# Patient Record
Sex: Male | Born: 1995 | Race: Black or African American | Hispanic: No | Smoking: Never smoker
Health system: Southern US, Community
[De-identification: ages and names within clinical notes are randomized; demographics above are authoritative.]

## PROBLEM LIST (undated history)

## (undated) DIAGNOSIS — I10 Essential (primary) hypertension: Secondary | ICD-10-CM

## (undated) DIAGNOSIS — B20 Human immunodeficiency virus [HIV] disease: Secondary | ICD-10-CM

## (undated) DIAGNOSIS — Z8774 Personal history of (corrected) congenital malformations of heart and circulatory system: Secondary | ICD-10-CM

## (undated) DIAGNOSIS — Q224 Congenital tricuspid stenosis: Secondary | ICD-10-CM

## (undated) DIAGNOSIS — I2699 Other pulmonary embolism without acute cor pulmonale: Secondary | ICD-10-CM

## (undated) DIAGNOSIS — R042 Hemoptysis: Secondary | ICD-10-CM

## (undated) DIAGNOSIS — Q249 Congenital malformation of heart, unspecified: Secondary | ICD-10-CM

## (undated) DIAGNOSIS — J029 Acute pharyngitis, unspecified: Secondary | ICD-10-CM

## (undated) DIAGNOSIS — Z21 Asymptomatic human immunodeficiency virus [HIV] infection status: Secondary | ICD-10-CM

## (undated) DIAGNOSIS — G43909 Migraine, unspecified, not intractable, without status migrainosus: Secondary | ICD-10-CM

## (undated) DIAGNOSIS — R197 Diarrhea, unspecified: Secondary | ICD-10-CM

## (undated) DIAGNOSIS — B2790 Infectious mononucleosis, unspecified without complication: Secondary | ICD-10-CM

## (undated) DIAGNOSIS — Z9889 Other specified postprocedural states: Secondary | ICD-10-CM

## (undated) HISTORY — DX: Diarrhea, unspecified: R19.7

## (undated) HISTORY — PX: CARDIAC SURGERY: SHX584

---

## 2016-01-13 HISTORY — PX: CARDIAC CATHETERIZATION: SHX172

## 2016-01-28 ENCOUNTER — Emergency Department (HOSPITAL_COMMUNITY): Payer: Commercial Managed Care - HMO

## 2016-01-28 ENCOUNTER — Encounter (HOSPITAL_COMMUNITY): Payer: Self-pay

## 2016-01-28 ENCOUNTER — Inpatient Hospital Stay (HOSPITAL_COMMUNITY)
Admission: EM | Admit: 2016-01-28 | Discharge: 2016-01-30 | DRG: 175 | Disposition: A | Discharge status: Other Acute Inpatient Hospital | Payer: Commercial Managed Care - HMO | Attending: Internal Medicine | Admitting: Internal Medicine

## 2016-01-28 DIAGNOSIS — R519 Headache, unspecified: Secondary | ICD-10-CM

## 2016-01-28 DIAGNOSIS — R51 Headache: Secondary | ICD-10-CM | POA: Diagnosis present

## 2016-01-28 DIAGNOSIS — Z8249 Family history of ischemic heart disease and other diseases of the circulatory system: Secondary | ICD-10-CM

## 2016-01-28 DIAGNOSIS — Q224 Congenital tricuspid stenosis: Secondary | ICD-10-CM

## 2016-01-28 DIAGNOSIS — R0682 Tachypnea, not elsewhere classified: Secondary | ICD-10-CM

## 2016-01-28 DIAGNOSIS — N390 Urinary tract infection, site not specified: Secondary | ICD-10-CM | POA: Diagnosis present

## 2016-01-28 DIAGNOSIS — R630 Anorexia: Secondary | ICD-10-CM

## 2016-01-28 DIAGNOSIS — I2699 Other pulmonary embolism without acute cor pulmonale: Principal | ICD-10-CM | POA: Diagnosis present

## 2016-01-28 DIAGNOSIS — R0602 Shortness of breath: Secondary | ICD-10-CM

## 2016-01-28 DIAGNOSIS — R109 Unspecified abdominal pain: Secondary | ICD-10-CM | POA: Diagnosis present

## 2016-01-28 DIAGNOSIS — I10 Essential (primary) hypertension: Secondary | ICD-10-CM | POA: Diagnosis present

## 2016-01-28 DIAGNOSIS — R509 Fever, unspecified: Secondary | ICD-10-CM

## 2016-01-28 DIAGNOSIS — Q249 Congenital malformation of heart, unspecified: Secondary | ICD-10-CM

## 2016-01-28 DIAGNOSIS — R1010 Upper abdominal pain, unspecified: Secondary | ICD-10-CM

## 2016-01-28 HISTORY — DX: Other pulmonary embolism without acute cor pulmonale: I26.99

## 2016-01-28 HISTORY — DX: Essential (primary) hypertension: I10

## 2016-01-28 HISTORY — DX: Congenital tricuspid stenosis: Q22.4

## 2016-01-28 HISTORY — DX: Migraine, unspecified, not intractable, without status migrainosus: G43.909

## 2016-01-28 HISTORY — DX: Congenital malformation of heart, unspecified: Q24.9

## 2016-01-28 LAB — I-STAT CG4 LACTIC ACID, ED: Lactic Acid, Venous: 1.46 mmol/L (ref 0.5–1.9)

## 2016-01-28 LAB — COMPREHENSIVE METABOLIC PANEL
ALT: 24 U/L (ref 17–63)
AST: 26 U/L (ref 15–41)
Albumin: 4 g/dL (ref 3.5–5.0)
Alkaline Phosphatase: 98 U/L (ref 38–126)
Anion gap: 9 (ref 5–15)
BILIRUBIN TOTAL: 1.6 mg/dL — AB (ref 0.3–1.2)
BUN: 8 mg/dL (ref 6–20)
CHLORIDE: 101 mmol/L (ref 101–111)
CO2: 25 mmol/L (ref 22–32)
Calcium: 9.5 mg/dL (ref 8.9–10.3)
Creatinine, Ser: 0.89 mg/dL (ref 0.61–1.24)
GFR calc Af Amer: >60 mL/min (ref >60)
GLUCOSE: 115 mg/dL — AB (ref 65–99)
POTASSIUM: 4.1 mmol/L (ref 3.5–5.1)
Sodium: 135 mmol/L (ref 135–145)
Total Protein: 8.9 g/dL — ABNORMAL HIGH (ref 6.5–8.1)

## 2016-01-28 LAB — CBC
HEMATOCRIT: 43.9 % (ref 39.0–52.0)
Hemoglobin: 15.1 g/dL (ref 13.0–17.0)
MCH: 30.3 pg (ref 26.0–34.0)
MCHC: 34.4 g/dL (ref 30.0–36.0)
MCV: 88 fL (ref 78.0–100.0)
Platelets: 277 10*3/uL (ref 150–400)
RBC: 4.99 MIL/uL (ref 4.22–5.81)
RDW: 12.6 % (ref 11.5–15.5)
WBC: 7.8 10*3/uL (ref 4.0–10.5)

## 2016-01-28 LAB — LIPASE, BLOOD: Lipase: 22 U/L (ref 11–51)

## 2016-01-28 LAB — TROPONIN I: Troponin I: <0.03 ng/mL (ref <0.03)

## 2016-01-28 MED ORDER — SODIUM CHLORIDE 0.9 % IV BOLUS (SEPSIS)
1000.0000 mL | Freq: Once | INTRAVENOUS | Status: AC
  Administered 2016-01-28: 1000 mL via INTRAVENOUS

## 2016-01-28 MED ORDER — IBUPROFEN 400 MG PO TABS: 400.0000 mg | ORAL_TABLET | Freq: Once | ORAL | Status: DC

## 2016-01-28 MED ORDER — DIPHENHYDRAMINE HCL 25 MG PO CAPS: 25.0000 mg | ORAL_CAPSULE | Freq: Once | ORAL | Status: DC

## 2016-01-28 MED ORDER — SODIUM CHLORIDE 0.9 % IV BOLUS (SEPSIS): 1000.0000 mL | Freq: Once | INTRAVENOUS | Status: DC

## 2016-01-28 MED ORDER — KETOROLAC TROMETHAMINE 30 MG/ML IJ SOLN: 60.0000 mg | Freq: Once | INTRAMUSCULAR | Status: DC

## 2016-01-28 MED ORDER — IOPAMIDOL (ISOVUE-300) INJECTION 61%
INTRAVENOUS | Status: AC
  Administered 2016-01-28: 100 mL
  Filled 2016-01-28: qty 100

## 2016-01-28 MED ORDER — ACETAMINOPHEN 325 MG PO TABS
650.0000 mg | ORAL_TABLET | Freq: Once | ORAL | Status: AC
  Administered 2016-01-28: 650 mg via ORAL
  Filled 2016-01-28: qty 2

## 2016-01-28 MED ORDER — MORPHINE SULFATE (PF) 2 MG/ML IV SOLN
2.0000 mg | Freq: Once | INTRAVENOUS | Status: AC
  Administered 2016-01-28: 2 mg via INTRAVENOUS
  Filled 2016-01-28: qty 1

## 2016-01-28 MED ORDER — SODIUM CHLORIDE 0.9 % IV SOLN
Freq: Once | INTRAVENOUS | Status: AC
  Administered 2016-01-29: via INTRAVENOUS

## 2016-01-28 MED ORDER — GI COCKTAIL ~~LOC~~: 30.0000 mL | Freq: Once | ORAL | Status: DC

## 2016-01-28 MED ORDER — DIPHENHYDRAMINE HCL 50 MG/ML IJ SOLN: 12.5000 mg | Freq: Once | INTRAMUSCULAR | Status: DC

## 2016-01-28 NOTE — ED Notes (Signed)
Patient transported to X-ray 

## 2016-01-28 NOTE — ED Notes (Addendum)
Pt here with mother with c/o abd pain and intermittent headache X2 weeks. Pt also reports intermittent nausea. Pt also reports some shortness of breath, hx of 3 heart surgeries.

## 2016-01-28 NOTE — ED Notes (Signed)
Patient transported to CT 

## 2016-01-28 NOTE — ED Provider Notes (Signed)
CSN: [id number]     Arrival date & time 01/28/16  1645 History   First MD Initiated Contact with Patient 01/28/16 1820     Chief Complaint  Patient presents with  . Abdominal Pain  . Headache     (Consider location/radiation/quality/duration/timing/severity/associated sxs/prior Treatment) HPI Comments: Micheal Leonard is a 20 y.o. male with history of congenital cardiac anomaly and hypertension presents to ED with complaint of headaches and abdominal pain. Patient states he has intermittent headaches for the last 3 weeks. They are frontal, gradual in onset, 9/10 in severity, throbbing in nature, and improve with tylenol. He is generally at rest when the headache starts. Antibiotic day varies. On occasion he will have aura like symptoms with wavy lines Archie onset of headache. He endorses some photophobia, no photophobia. On occasion he may have nausea and he has had one episode of vomiting with headache. He has a long history of headaches and these are the same as his previous headaches. Headaches tend to be worse in spring and summer. Denies head trauma. No numbness, weakness, fever, neck pain, loss of consciousness, dizziness, lightheadedness, or changes in vision.  He also endorses a 3 week history of abdominal pain. Pain is sharp in nature in the upper abdomen, 7 out of 10, worse when laying down after eating. He has had nausea. No vomiting. No diarrhea. No constipation. No blood in stool. He is burping and passing gas. He does endorse some associated difficulty breathing and nonproductive cough. Denies chest pain. There was concern for possible food poisoning after patient ate food that was left on the counter. However symptoms have persisted. He has tried Miralax and Dulcolax with minimal relief of symptoms.  Patient is a 20 y.o. male presenting with abdominal pain and headaches. The history is provided by the patient, a parent and medical records.  Abdominal Pain Associated symptoms: cough,  nausea and shortness of breath   Headache Associated symptoms: abdominal pain, cough and nausea     Past Medical History  Diagnosis Date  . Hypertension   . Cardiac anomaly, congenital    Past Surgical History  Procedure Laterality Date  . Cardiac surgery      fontane procedure at [facility] as a child    No family history on file. Social History  Substance Use Topics  . Smoking status: Never Smoker   . Smokeless tobacco: Not on file  . Alcohol Use: No    Review of Systems  Respiratory: Positive for cough and shortness of breath.   Gastrointestinal: Positive for nausea and abdominal pain.  Neurological: Positive for headaches.  All other systems reviewed and are negative.   Allergies  Review of patient's allergies indicates no known allergies.  Home Medications   Prior to Admission medications   Medication Sig Start Date End Date Taking? Authorizing Provider  aspirin-acetaminophen-caffeine (EXCEDRIN MIGRAINE) [phone] MG tablet Take 1 tablet by mouth daily as needed for headache.   Yes Historical Provider, MD  bisacodyl (DULCOLAX) 5 MG EC tablet Take 5 mg by mouth daily as needed for moderate constipation.   Yes Historical Provider, MD  polyethylene glycol (MIRALAX / GLYCOLAX) packet Take 17 g by mouth daily.   Yes Historical Provider, MD   BP 145/86 mmHg  Pulse 125  Temp(Src) 99.3 F (37.4 C) (Oral)  Ht 6' (1.829 m)  Wt 84.097 kg  BMI 25.14 kg/m2  SpO2 100% Physical Exam  Constitutional: He appears well-developed and well-nourished. No distress.  HENT:  Head: Normocephalic and atraumatic.  Mouth/Throat: Uvula is midline and oropharynx is clear and moist. No trismus in the jaw. No oropharyngeal exudate.  B/l tonsillar hypertrophy, no exudate noted.   Eyes: Conjunctivae and EOM are normal. Pupils are equal, round, and reactive to light. Right eye exhibits no discharge. Left eye exhibits no discharge. No scleral icterus.  Neck: Normal range of motion. Neck supple.   Cardiovascular: Regular rhythm, normal heart sounds and intact distal pulses.  Tachycardia present.   No murmur heard. Pulmonary/Chest: Effort normal and breath sounds normal. No respiratory distress.  Abdominal: Soft. Bowel sounds are normal. There is tenderness in the right upper quadrant, right lower quadrant and epigastric area. There is CVA tenderness ( right) and positive Murphy's sign. There is no rebound and no guarding.  Musculoskeletal: Normal range of motion.  Lymphadenopathy:    He has no cervical adenopathy.  Neurological: He is alert. Coordination normal.  Mental Status:  Alert, thought content appropriate, able to give a coherent history. Speech fluent without evidence of aphasia. Able to follow 2 step commands without difficulty.  Cranial Nerves:  II:  Peripheral visual fields grossly normal, pupils equal, round, reactive to light III,IV, VI: ptosis not present, extra-ocular motions intact bilaterally  V,VII: smile symmetric, facial light touch sensation equal VIII: hearing grossly normal to voice  X: uvula elevates symmetrically  XI: bilateral shoulder shrug symmetric and strong XII: midline tongue extension without fassiculations Motor:  Normal tone. 5/5 in upper and lower extremities bilaterally including strong and equal grip strength and dorsiflexion/plantar flexion Sensory: light touch normal in all extremities.  Cerebellar: normal finger-to-nose with bilateral upper extremities Gait: normal gait and balance CV: distal pulses palpable throughout   Skin: Skin is warm and dry. He is not diaphoretic.  Psychiatric: He has a normal mood and affect.    ED Course  Procedures (including critical care time) Labs Review Labs Reviewed  COMPREHENSIVE METABOLIC PANEL - Abnormal; Notable for the following:    Glucose, Bld 115 (*)    Total Protein 8.9 (*)    Total Bilirubin 1.6 (*)    All other components within normal limits  LIPASE, BLOOD  CBC  URINALYSIS, ROUTINE W  REFLEX MICROSCOPIC (NOT AT [facility])  URINE RAPID DRUG SCREEN, HOSP PERFORMED  TROPONIN I    Imaging Review Dg Abd Acute W/chest  01/28/2016  CLINICAL DATA:  Abdominal pain for 3 months. Intermittent headache for 2 weeks EXAM: DG ABDOMEN ACUTE W/ 1V CHEST COMPARISON:  None. FINDINGS: There is no evidence of dilated bowel loops or free intraperitoneal air. No radiopaque calculi or other significant radiographic abnormality is seen. Heart size and mediastinal contours are within normal limits. Mild bilateral interstitial thickening. No pleural effusion or pneumothorax. IMPRESSION: Negative abdominal radiographs. Mild bilateral interstitial prominence which may reflect mild pulmonary vascular congestion. Electronically Signed   By: Elige Ko   On: 01/28/2016 20:54   I have personally reviewed and evaluated these images and lab results as part of my medical decision-making.   EKG Interpretation None      MDM   Final diagnoses:  Nonintractable headache, unspecified chronicity pattern, unspecified headache type  Pain of upper abdomen   Patient is afebrile and non-toxic appearing in NAD. He is resting comfortably in bed. He is tachycardic at 125, however patient endorses that he typically is tachycardic. Pressure is elevated as well patient is a history of hypertension. Normal neurologic exam. Abdominal exam remarkable for tenderness to palpation, specifically in right upper quadrant. Abdomen is soft and positive bowel  sounds. He also had right CVA tenderness.  Regarding headache - Presentation is like pts typical HA and non concerning for [facility], ICH, Meningitis, or temporal arteritis. Do not think imaging is warranted at this time. Pt initially afebrile with no focal neuro deficits, nuchal rigidity, or change in vision. Patient initially declined IV placement. Will give PO meds. Discussed importance for follow up with primary care provider regarding frequency of headaches and need for prophylactic  medication. Will also give referral to headache clinic.   Regarding abdominal pain - CBC is unremarkable, no leukocytosis. CMP shows slightly elevated bilirubin, however LFTs normal, no jaundice - low suspicion for acute cholecystitis or cholangitis. Lipase normal - low suspicion for pancreatitis. Low suspicion for diverticulitis - no TTP of LLQ. Abdominal x-ray negative for dilated bowel loops or intraperitoneal air, doubt obstruction or perforation. Given history and physical exam concern for acid reflux vs. biliary colic vs UTI vs. PNA vs. ?appendicitis. UA pending. Chest x-ray remarkable for bilateral interstitial thickening - ? Early pneumonia.   9:39 PM: On re-evaluation, patient is warm to touch and complaining of being hot. Repeat temp is 101.4 oral.  Patient remains tachycardic. Patient remains TTP of abdomen in RUQ and now has TTP of RLQ. Code sepsis initiated. Hold of on ABX until source determined. Patient agreeable to IV placement. CT abd/pelvis. Lactic acid.   9:50 PM: At shift change discussed patient with Dierdre ForthHannah Muthersbaugh, PA-C. Dispo pending lactic acid and CT abd/pelvis. Suspect may need admission.   Lona Kettleshley Laurel Vida Nicol, [place] JerseyPA-C 01/28/16 2154  Nelva Nayobert Beaton, MD 01/28/16 2228

## 2016-01-28 NOTE — ED Notes (Signed)
Attempted to place an IV to patient. Pt mother states "Why would you put an IV in him when you do not know what is wrong with him.." Pt mother refused IV placement at present.

## 2016-01-28 NOTE — ED Provider Notes (Signed)
Care [facility] from [facility] Meyer, PA-C.  Micheal Leonard is a 20 y.o. male with hx of tricuspid atresia and [facility] presents with epigastric/upper abd pain x2 weeks.  Mother reports he stopped eating about 3 weeks ago, 1 week prior to the onset of pain.  She began Miralax at that time.  He has had associated nausea,  Sharp in nature.  Pt is passing gas and on initial exam far more tender in the RUQ.  Pt is refusing and IV.  He has low grade fever and tachycardia on triage vitals.  CXR with bilateral interstitial thickening and vascular congestion.  He also has SOB.  Mother reports that he was breathing faster today than usual.  Pt is able to speak in full sentences.  ECG with some changes but there is no old for comparison.  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  ECG reading obtained the same day states LVH with repol abnormalities and widened QRS.  Unable to view the ECG tracing in Care Everywhere.    Pt also c/o intermittent headaches, similar to previous frontal and throbbing headaches.  Usually accompanied by nausea and phonophobia.  Headache today is unchanged from any previous.    Physical Exam  BP 145/86 mmHg  Pulse 125  Temp(Src) 99.3 F (37.4 C) (Oral)  Ht 6' (1.829 m)  Wt 84.097 kg  BMI 25.14 kg/m2  SpO2 100%  Physical Exam   Face to face Exam:   General: Awake  HEENT: Atraumatic  Resp: Increased effort, tachypnea  Cardiac: Tachycardia Chest: well healed surgical incisions Abd: Nondistended, TTP in the RUQ and RLQ  MSK: no pitting edema Neuro:No focal weakness  Lymph: No adenopathy Skin: hot, dry; no rash    ED Course  Procedures  1. Nonintractable headache, unspecified chronicity pattern, unspecified headache type   2. Pain of upper abdomen   3. SOB (shortness of breath)   4. Tachypnea   5. Fever, unspecified fever cause   6. Decreased  appetite   7. Cardiac anomaly, congenital    MDM Plan: Concern for possible cholelithiasis vs gastritis.  US abd ordered.  Troponin pending.    9:45 PM Pt is now febrile and remains tachycardic.  He is amenable to IV at this time.  Will obtain CT abd, blood and urine cultures.  Pt given fluid bolus per sepsis protocol.    The patient was discussed with and seen by Dr. Radford PaxBeaton who agrees with the treatment plan.  12:00AM Patient lactic acid normal. Discontinued 30 mL/kg bolus due to long cardiac history.    2:03 AM Patient remains severely to get neck with increased work of breathing. He also remains low-grade fever.  BNP pending. Patient refusing ABG. Patient with tea colored urine which shows ketones, bilirubin, protein, nitrites and leukocytes but 0-5 white blood cells and rare bacteria. CK pending.  She will need admission for echocardiogram and further evaluation of his CAT scan.  2:30 AM Discussed with Dr. Clyde LundborgNiu who will admit.   Dahlia ClientHannah Kemoni Quesenberry, PA-C 01/29/16 0230  Micheal Nayobert Beaton, MD 02/03/16 1027

## 2016-01-28 NOTE — ED Notes (Signed)
PA at bedside.

## 2016-01-29 ENCOUNTER — Observation Stay (HOSPITAL_COMMUNITY): Payer: Commercial Managed Care - HMO

## 2016-01-29 ENCOUNTER — Encounter (HOSPITAL_COMMUNITY): Payer: Self-pay | Admitting: Internal Medicine

## 2016-01-29 DIAGNOSIS — I2609 Other pulmonary embolism with acute cor pulmonale: Secondary | ICD-10-CM

## 2016-01-29 DIAGNOSIS — R509 Fever, unspecified: Secondary | ICD-10-CM | POA: Diagnosis not present

## 2016-01-29 DIAGNOSIS — Z8249 Family history of ischemic heart disease and other diseases of the circulatory system: Secondary | ICD-10-CM | POA: Diagnosis not present

## 2016-01-29 DIAGNOSIS — Q224 Congenital tricuspid stenosis: Secondary | ICD-10-CM | POA: Diagnosis not present

## 2016-01-29 DIAGNOSIS — R630 Anorexia: Secondary | ICD-10-CM | POA: Diagnosis not present

## 2016-01-29 DIAGNOSIS — G44229 Chronic tension-type headache, not intractable: Secondary | ICD-10-CM

## 2016-01-29 DIAGNOSIS — R109 Unspecified abdominal pain: Secondary | ICD-10-CM

## 2016-01-29 DIAGNOSIS — I10 Essential (primary) hypertension: Secondary | ICD-10-CM | POA: Diagnosis present

## 2016-01-29 DIAGNOSIS — R0602 Shortness of breath: Secondary | ICD-10-CM

## 2016-01-29 DIAGNOSIS — Q249 Congenital malformation of heart, unspecified: Secondary | ICD-10-CM

## 2016-01-29 DIAGNOSIS — R0682 Tachypnea, not elsewhere classified: Secondary | ICD-10-CM

## 2016-01-29 DIAGNOSIS — I2699 Other pulmonary embolism without acute cor pulmonale: Secondary | ICD-10-CM | POA: Diagnosis not present

## 2016-01-29 DIAGNOSIS — R51 Headache: Secondary | ICD-10-CM | POA: Diagnosis present

## 2016-01-29 DIAGNOSIS — R519 Headache, unspecified: Secondary | ICD-10-CM | POA: Diagnosis present

## 2016-01-29 DIAGNOSIS — N390 Urinary tract infection, site not specified: Secondary | ICD-10-CM | POA: Diagnosis present

## 2016-01-29 HISTORY — DX: Other pulmonary embolism without acute cor pulmonale: I26.99

## 2016-01-29 LAB — COMPREHENSIVE METABOLIC PANEL
ALBUMIN: 3.3 g/dL — AB (ref 3.5–5.0)
ALK PHOS: 84 U/L (ref 38–126)
ALT: 20 U/L (ref 17–63)
ANION GAP: 8 (ref 5–15)
AST: 24 U/L (ref 15–41)
BUN: 7 mg/dL (ref 6–20)
CHLORIDE: 102 mmol/L (ref 101–111)
CO2: 23 mmol/L (ref 22–32)
Calcium: 8.6 mg/dL — ABNORMAL LOW (ref 8.9–10.3)
Creatinine, Ser: 0.79 mg/dL (ref 0.61–1.24)
GFR calc Af Amer: >60 mL/min (ref >60)
GFR calc non Af Amer: >60 mL/min (ref >60)
GLUCOSE: 129 mg/dL — AB (ref 65–99)
POTASSIUM: 3.5 mmol/L (ref 3.5–5.1)
SODIUM: 133 mmol/L — AB (ref 135–145)
Total Bilirubin: 1.8 mg/dL — ABNORMAL HIGH (ref 0.3–1.2)
Total Protein: 7.8 g/dL (ref 6.5–8.1)

## 2016-01-29 LAB — PROTIME-INR
INR: 1.37 (ref 0.00–1.49)
PROTHROMBIN TIME: 16.9 s — AB (ref 11.6–15.2)

## 2016-01-29 LAB — URINALYSIS, ROUTINE W REFLEX MICROSCOPIC
Glucose, UA: NEGATIVE mg/dL
Hgb urine dipstick: NEGATIVE
Ketones, ur: 40 mg/dL — AB
NITRITE: POSITIVE — AB
Protein, ur: 30 mg/dL — AB
SPECIFIC GRAVITY, URINE: 1.03 (ref 1.005–1.030)
pH: 6 (ref 5.0–8.0)

## 2016-01-29 LAB — HIV ANTIBODY (ROUTINE TESTING W REFLEX): HIV Screen 4th Generation wRfx: NONREACTIVE

## 2016-01-29 LAB — HEPARIN LEVEL (UNFRACTIONATED): HEPARIN UNFRACTIONATED: 0.39 [IU]/mL (ref 0.30–0.70)

## 2016-01-29 LAB — URINE MICROSCOPIC-ADD ON

## 2016-01-29 LAB — LACTIC ACID, PLASMA: Lactic Acid, Venous: 1.2 mmol/L (ref 0.5–1.9)

## 2016-01-29 LAB — D-DIMER, QUANTITATIVE (NOT AT ARMC): D DIMER QUANT: 3.11 ug{FEU}/mL — AB (ref 0.00–0.50)

## 2016-01-29 LAB — CK: Total CK: 121 U/L (ref 49–397)

## 2016-01-29 LAB — BRAIN NATRIURETIC PEPTIDE: B Natriuretic Peptide: 13.1 pg/mL (ref 0.0–100.0)

## 2016-01-29 LAB — GLUCOSE, CAPILLARY: GLUCOSE-CAPILLARY: 116 mg/dL — AB (ref 65–99)

## 2016-01-29 MED ORDER — OXYCODONE-ACETAMINOPHEN 5-325 MG PO TABS: 1.0000 | ORAL_TABLET | ORAL | Status: DC | PRN

## 2016-01-29 MED ORDER — SODIUM CHLORIDE 0.9 % IV SOLN
Freq: Once | INTRAVENOUS | Status: AC
  Administered 2016-01-29: 03:00:00 via INTRAVENOUS

## 2016-01-29 MED ORDER — HEPARIN (PORCINE) IN NACL 100-0.45 UNIT/ML-% IJ SOLN: 16.0000 [IU]/kg/h | INTRAMUSCULAR | Status: DC

## 2016-01-29 MED ORDER — ENOXAPARIN SODIUM 40 MG/0.4ML ~~LOC~~ SOLN: 40.0000 mg | SUBCUTANEOUS | Status: DC

## 2016-01-29 MED ORDER — SODIUM CHLORIDE 0.9 % IV SOLN: INTRAVENOUS | Status: DC

## 2016-01-29 MED ORDER — HEPARIN BOLUS VIA INFUSION
5000.0000 [IU] | INTRAVENOUS | Status: AC
  Administered 2016-01-29: 5000 [IU] via INTRAVENOUS
  Filled 2016-01-29: qty 5000

## 2016-01-29 MED ORDER — HEPARIN (PORCINE) IN NACL 100-0.45 UNIT/ML-% IJ SOLN
16.0000 [IU]/kg/h | INTRAMUSCULAR | Status: DC
  Administered 2016-01-29: 16 [IU]/kg/h via INTRAVENOUS
  Filled 2016-01-29: qty 250

## 2016-01-29 MED ORDER — HEPARIN (PORCINE) IN NACL 100-0.45 UNIT/ML-% IJ SOLN
1450.0000 [IU]/h | INTRAMUSCULAR | Status: DC
  Administered 2016-01-29 – 2016-01-30 (×2): 1450 [IU]/h via INTRAVENOUS
  Filled 2016-01-29 (×2): qty 250

## 2016-01-29 MED ORDER — ACETAMINOPHEN 650 MG RE SUPP: 650.0000 mg | Freq: Four times a day (QID) | RECTAL | Status: DC | PRN

## 2016-01-29 MED ORDER — SODIUM CHLORIDE 0.9% FLUSH
3.0000 mL | Freq: Two times a day (BID) | INTRAVENOUS | Status: DC
  Administered 2016-01-29 – 2016-01-30 (×3): 3 mL via INTRAVENOUS

## 2016-01-29 MED ORDER — HEPARIN (PORCINE) IN NACL 100-0.45 UNIT/ML-% IJ SOLN: 1450.0000 [IU]/kg/h | INTRAMUSCULAR | Status: DC

## 2016-01-29 MED ORDER — ALBUTEROL SULFATE (2.5 MG/3ML) 0.083% IN NEBU: 2.5000 mg | INHALATION_SOLUTION | Freq: Four times a day (QID) | RESPIRATORY_TRACT | Status: DC | PRN

## 2016-01-29 MED ORDER — ACETAMINOPHEN 325 MG PO TABS
650.0000 mg | ORAL_TABLET | Freq: Four times a day (QID) | ORAL | Status: DC | PRN
Start: 2016-01-29 — End: 2016-01-30
  Administered 2016-01-29 – 2016-01-30 (×3): 650 mg via ORAL
  Filled 2016-01-29 (×3): qty 2

## 2016-01-29 MED ORDER — SODIUM CHLORIDE 0.9 % IV SOLN: Freq: Once | INTRAVENOUS | Status: AC

## 2016-01-29 MED ORDER — SODIUM CHLORIDE 0.9 % IV BOLUS (SEPSIS)
500.0000 mL | Freq: Once | INTRAVENOUS | Status: AC
  Administered 2016-01-29: 500 mL via INTRAVENOUS

## 2016-01-29 MED ORDER — HYDROXYZINE HCL 50 MG/ML IM SOLN
25.0000 mg | Freq: Four times a day (QID) | INTRAMUSCULAR | Status: DC | PRN
  Filled 2016-01-29: qty 0.5

## 2016-01-29 MED ORDER — OXYCODONE HCL 5 MG PO TABS
5.0000 mg | ORAL_TABLET | ORAL | Status: DC | PRN
  Administered 2016-01-29 – 2016-01-30 (×4): 5 mg via ORAL
  Filled 2016-01-29 (×4): qty 1

## 2016-01-29 MED ORDER — FOSFOMYCIN TROMETHAMINE 3 G PO PACK
3.0000 g | PACK | Freq: Once | ORAL | Status: AC
  Administered 2016-01-29: 3 g via ORAL
  Filled 2016-01-29: qty 3

## 2016-01-29 MED ORDER — IOPAMIDOL (ISOVUE-370) INJECTION 76%
INTRAVENOUS | Status: AC
  Administered 2016-01-29: 100 mL
  Filled 2016-01-29: qty 100

## 2016-01-29 MED ORDER — POLYETHYLENE GLYCOL 3350 17 G PO PACK: 17.0000 g | PACK | Freq: Every day | ORAL | Status: DC | PRN

## 2016-01-29 MED ORDER — IBUPROFEN 400 MG PO TABS
400.0000 mg | ORAL_TABLET | Freq: Once | ORAL | Status: AC
  Administered 2016-01-29: 400 mg via ORAL
  Filled 2016-01-29 (×2): qty 1

## 2016-01-29 MED ORDER — BISACODYL 5 MG PO TBEC
5.0000 mg | DELAYED_RELEASE_TABLET | Freq: Every day | ORAL | Status: DC | PRN
  Filled 2016-01-29: qty 1

## 2016-01-29 MED ORDER — ASPIRIN-ACETAMINOPHEN-CAFFEINE 250-250-65 MG PO TABS
1.0000 | ORAL_TABLET | Freq: Every day | ORAL | Status: DC | PRN
  Filled 2016-01-29: qty 1

## 2016-01-29 MED ORDER — ONDANSETRON HCL 4 MG/2ML IJ SOLN
4.0000 mg | Freq: Three times a day (TID) | INTRAMUSCULAR | Status: DC | PRN
  Administered 2016-01-29 – 2016-01-30 (×2): 4 mg via INTRAVENOUS
  Filled 2016-01-29 (×2): qty 2

## 2016-01-29 MED ORDER — OXYCODONE HCL 5 MG PO TABS
10.0000 mg | ORAL_TABLET | Freq: Four times a day (QID) | ORAL | Status: DC | PRN
Start: 2016-01-29 — End: 2016-01-29

## 2016-01-29 NOTE — Discharge Summary (Addendum)
Physician Discharge Summary  Micheal Leonard ZOX:[id number] DOB: [birth year] DOA: 01/28/2016  PCP: No PCP Per Patient  Admit date: 01/28/2016 Discharge date: 01/30/2016   Recommendations for Outpatient Follow-Up:   1. The patient is being transferred to [facility] under the care of his cardiologist for further evaluation/management.   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  Discharge Condition: Stable.  Diet recommendation: Regular.   History of Present Illness:   Micheal Leonard is an 20 y.o. male with a PMH of congenital heart disease and chronic headaches who was admitted 01/28/16 with abdominal pain and shortness of breath. Upon initial evaluation in the ED, the patient was found to be febrile, tachycardic, tachypneic with an elevated d-dimer.  Hospital Course by Problem:   Principal Problem:  Acute pulmonary embolism Extensive right-sided pulmonary embolus with pulmonary embolism arising from the right main pulmonary artery extending into multiple branches on the right. There is expansion of the right main pulmonary artery due to a localized thrombus. There is questionable thrombus in the right ventricle as well. Right ventricular to left ventricular diameter is less than 0.9, not meeting criteria for right heart strain. The patient has a history of congenital heart disease and had a Fontan Procedure done.Cardiology consulted given his complex cardiac anatomy. Evaluated by Dr. Eden Emms who recommended transfer to [facility].  Active Problems:  Abdominal pain/UTI/fever CT of the abdomen/pelvis and abdominal ultrasound without significant abnormalities. Serum lipase WNL. Follow-up hepatitis panel and  HIV testing. Given a dose of fosfomycin for treatment of probable UTI given positive nitrites and leukocytes on urinalysis. Continue supportive care. Follow-up blood cultures/urine culture. 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   Cardiac anomaly, congenital History of surgical repair at [facility] (Fontan procedure) at age 55, for tricuspid atresia with normal great arteries and ventricular septal defect. Chronic anticoagulation is recommended for all Fontan patients with a history of thromboembolism.   Headache Chronic issue, pain management as needed.   Medical Consultants:    Dr. Charlton Haws, Cardiology   Discharge Exam:   Filed Vitals:   01/30/16 1225 01/30/16 1345  BP: 109/76   Pulse: 114   Temp: 102.5 F (39.2 C) 99.7 F (37.6 C)  Resp: 20    Filed Vitals:   01/30/16 0030 01/30/16 0535 01/30/16 1225 01/30/16 1345  BP:  126/69 109/76   Pulse:  83 114   Temp: 99.4 F (37.4 C) 97.6 F (36.4 C) 102.5 F (39.2 C) 99.7 F (37.6 C)  TempSrc: Oral Oral Oral Oral  Resp:  20 20   Height:      Weight:  87.454 kg (192 lb 12.8 oz)    SpO2:  99% 98%    General exam: Appears calm and comfortable.  Respiratory system: Clear to auscultation. Respiratory effort normal. Cardiovascular system: S1 & S2 heard, RRR. No JVD, rubs, gallops or clicks. No murmurs. Gastrointestinal system: Abdomen is nondistended, soft and nontender. No organomegaly or masses felt. Normal bowel sounds heard. Central nervous system: Alert and oriented. No focal neurological deficits. Extremities: No clubbing, edema, or cyanosis. Skin: No rashes, lesions or ulcers.  Midline sternal scar. Psychiatry: Judgement and insight appear normal. Mood & affect appropriate.   The results of significant diagnostics from this hospitalization (including imaging, microbiology, ancillary and laboratory) are listed  below for reference.     Procedures and Diagnostic Studies:   Ct Angio Chest Pe W Or Wo Contrast  01/29/2016  CLINICAL DATA:  Shortness of breath for 2 days EXAM: CT ANGIOGRAPHY CHEST WITH CONTRAST TECHNIQUE: Multidetector CT imaging of the chest was performed using the standard protocol during bolus administration of intravenous contrast. Multiplanar CT image reconstructions and MIPs were obtained to evaluate the vascular anatomy. CONTRAST:  100 mL Isovue 370 nonionic COMPARISON:  Chest radiograph January 28, 2016 FINDINGS: Cardiovascular: There is extensive pulmonary embolism arising from the main right pulmonary artery with extension into most of the right-sided pulmonary arterial branches. Thrombus in the right main pulmonary artery expands the vessel. No pulmonary embolus to the left is noted. There is apparent thrombus in the right ventricle as well. The right ventricle to left ventricle diameter ratio is less than 0.9 which is not indicative of right heart strain. There is no appreciable thoracic aortic aneurysm or dissection. The visualized great vessels appear normal. Pericardium is not thickened. Mediastinum/Nodes: Visualized thyroid appears normal. There is no appreciable thoracic adenopathy. Lungs/Pleura: There is no edema or consolidation. Upper Abdomen: Visualized upper abdominal structures appear unremarkable. Musculoskeletal: There are no blastic or lytic bone lesions. Review of the MIP images confirms the above findings. IMPRESSION: Extensive right-sided pulmonary embolus with pulmonary embolism arising from the right main pulmonary artery extending into multiple branches on the right. There is expansion of the right main pulmonary artery due to a localized thrombus. There is questionable thrombus in the right ventricle as well. Right ventricular to left ventricular diameter is less than 0.9, not meeting criteria for right heart strain. No edema or consolidation. Critical Value/emergent results  were called by telephone at the time of interpretation on 01/29/2016 at 8:49 am to Dr. Darnelle Catalan, hospitalist , who verbally acknowledged these results. Electronically Signed   By: Bretta Bang III M.D.   On: 01/29/2016 08:51   Ct Abdomen Pelvis W Contrast  01/29/2016  CLINICAL DATA:  Intermittent abdominal pain for 2 weeks.  Nausea. EXAM: CT ABDOMEN AND PELVIS WITH CONTRAST TECHNIQUE: Multidetector CT imaging of the abdomen and pelvis was performed using the standard protocol following bolus administration of intravenous contrast. CONTRAST:  ISOVUE-300 IOPAMIDOL (ISOVUE-300) INJECTION 61% COMPARISON:  None. FINDINGS: Small pleural effusions, right greater than left, identified. Evidence of previous cardiac surgery with a conduit or stent in the inferior vena cava. No other acute abnormalities seen within the lower chest. No free air or free fluid. There is mild periportal edema which is nonspecific. The liver and portal vein are otherwise normal. The gallbladder is unremarkable. The spleen is prominent measuring 13 cm in cranial caudal dimension. The spleen is otherwise normal. The adrenal glands, pancreas, and kidneys are normal. The abdominal aorta is normal in appearance. No adenopathy. The stomach and small bowel are unremarkable. The colon and appendix are normal. No other abnormalities in the abdomen. The pelvis demonstrates no adenopathy or mass. The bladder, prostate, and seminal vesicles are normal. Visualized bones are unremarkable. IMPRESSION: 1. Previous cardiac surgery.  Tiny bilateral pleural effusions. 2. Mild periportal edema, nonspecific. 3. No other acute abnormalities. Electronically Signed   By: Gerome Sam III M.D   On: 01/29/2016 00:53   US Abdomen Limited  01/28/2016  CLINICAL DATA:  Right upper  quadrant pain EXAM: US ABDOMEN LIMITED - RIGHT UPPER QUADRANT COMPARISON:  None. FINDINGS: Gallbladder: No gallstones or wall thickening visualized. No sonographic Murphy sign noted by  sonographer. Common bile duct: Diameter: 2 mm Liver: No focal lesion identified. Within normal limits in parenchymal echogenicity. Antegrade flow in the imaged portal venous system. IMPRESSION: Negative exam. Electronically Signed   By: Marnee SpringJonathon  Watts M.D.   On: 01/28/2016 22:55   Dg Abd Acute W/chest  01/28/2016  CLINICAL DATA:  Abdominal pain for 3 months. Intermittent headache for 2 weeks EXAM: DG ABDOMEN ACUTE W/ 1V CHEST COMPARISON:  None. FINDINGS: There is no evidence of dilated bowel loops or free intraperitoneal air. No radiopaque calculi or other significant radiographic abnormality is seen. Heart size and mediastinal contours are within normal limits. Mild bilateral interstitial thickening. No pleural effusion or pneumothorax. IMPRESSION: Negative abdominal radiographs. Mild bilateral interstitial prominence which may reflect mild pulmonary vascular congestion. Electronically Signed   By: Elige KoHetal  Patel   On: 01/28/2016 20:54     Labs:   Basic Metabolic Panel:  Recent Labs Lab 01/28/16 1703 01/29/16 0430  NA 135 133*  K 4.1 3.5  CL 101 102  CO2 25 23  GLUCOSE 115* 129*  BUN 8 7  CREATININE 0.89 0.79  CALCIUM 9.5 8.6*   GFR Estimated Creatinine Clearance: 163 mL/min (by C-G formula based on Cr of 0.79). Liver Function Tests:  Recent Labs Lab 01/28/16 1703 01/29/16 0430  AST 26 24  ALT 24 20  ALKPHOS 98 84  BILITOT 1.6* 1.8*  PROT 8.9* 7.8  ALBUMIN 4.0 3.3*    Recent Labs Lab 01/28/16 1703  LIPASE 22    Coagulation profile  Recent Labs Lab 01/29/16 0430  INR 1.37    CBC:  Recent Labs Lab 01/28/16 1703 01/30/16 1035  WBC 7.8 9.2  HGB 15.1 13.3  HCT 43.9 39.0  MCV 88.0 87.1  PLT 277 238   Cardiac Enzymes:  Recent Labs Lab 01/28/16 2225 01/29/16 0155  CKTOTAL  --  121  TROPONINI <0.03  --    CBG:  Recent Labs Lab 01/29/16 0800 01/30/16 1123  GLUCAP 116* 100*   D-Dimer  Recent Labs  01/29/16 0430  DDIMER 3.11*    Microbiology Recent Results (from the past 240 hour(s))  Blood Culture (routine x 2)     Status: None (Preliminary result)   Collection Time: 01/28/16 10:40 PM  Result Value Ref Range Status   Specimen Description BLOOD LEFT ANTECUBITAL  Final   Special Requests BOTTLES DRAWN AEROBIC AND ANAEROBIC 5CC   Final   Culture NO GROWTH < 24 HOURS  Final   Report Status PENDING  Incomplete  Blood Culture (routine x 2)     Status: None (Preliminary result)   Collection Time: 01/28/16 10:55 PM  Result Value Ref Range Status   Specimen Description BLOOD RIGHT ANTECUBITAL  Final   Special Requests BOTTLES DRAWN AEROBIC AND ANAEROBIC 10CC   Final   Culture NO GROWTH < 24 HOURS  Final   Report Status PENDING  Incomplete  Urine culture     Status: Abnormal   Collection Time: 01/29/16 12:57 AM  Result Value Ref Range Status   Specimen Description URINE, RANDOM  Final   Special Requests NONE  Final   Culture 2,000 COLONIES/mL INSIGNIFICANT GROWTH (A)  Final   Report Status 01/30/2016 FINAL  Final     Discharge Instructions:       Discharge Instructions    Call MD for:  difficulty breathing, headache or visual disturbances    Complete by:  As directed      Call MD for:  extreme fatigue    Complete by:  As directed      Call MD for:  persistant dizziness or light-headedness    Complete by:  As directed      Diet - low sodium heart healthy    Complete by:  As directed      Increase activity slowly    Complete by:  As directed             Medication List    TAKE these medications        aspirin-acetaminophen-caffeine 250-250-65 MG tablet  Commonly known as:  EXCEDRIN MIGRAINE  Take 1 tablet by mouth daily as needed for headache.     bisacodyl 5 MG EC tablet  Commonly known as:  DULCOLAX  Take 5 mg by mouth daily as needed for moderate constipation.     heparin 100-0.45 UNIT/ML-% infusion  Inject 1,450 Units/hr into the vein continuous.     polyethylene glycol packet   Commonly known as:  MIRALAX / GLYCOLAX  Take 17 g by mouth daily.       Follow-up Information    Follow up with [facility] EMERGENCY DEPARTMENT.   Specialty:  Emergency Medicine   Why:  If symptoms worsen   Contact information:   [address] [id number] mc [place] [address] [phone]      Call [facility].   Why:  to schedule an appointment   Contact information:   [address] [place] [address] [phone]       Follow up with Farris Has, MD On 03/04/2016.   Specialty:  Family Medicine   Why:  2 pm for new patient hospital follow up   Contact information:   [address] [place] [address] [phone]        Time coordinating discharge: 35 minutes.  Signed:  Marcell Pfeifer  Pager [phone] [facility] 01/30/2016, 4:17 PM

## 2016-01-29 NOTE — Progress Notes (Addendum)
ANTICOAGULATION CONSULT NOTE - Initial Consult  Pharmacy Consult for Heparin Indication: pulmonary embolus  No Known Allergies  Patient Measurements: Height: 6' (182.9 cm) Weight: 189 lb 8 oz (85.957 kg) (Scale A) IBW/kg (Calculated) : 77.6 Heparin Dosing Weight: 85.9 kg  Vital Signs: Temp: 101 F (38.3 C) (07/17 0359) Temp Source: Oral (07/17 0359) BP: 123/60 mmHg (07/17 0359) Pulse Rate: 113 (07/17 0359)  Labs:  Recent Labs  01/28/16 1703 01/28/16 2225 01/29/16 0155 01/29/16 0430  HGB 15.1  --   --   --   HCT 43.9  --   --   --   PLT 277  --   --   --   LABPROT  --   --   --  16.9*  INR  --   --   --  1.37  CREATININE 0.89  --   --  0.79  CKTOTAL  --   --  121  --   TROPONINI  --  <0.03  --   --     Estimated Creatinine Clearance: 163 mL/min (by C-G formula based on Cr of 0.79).   Medical History: Past Medical History  Diagnosis Date  . Hypertension   . Cardiac anomaly, congenital     Medications:  Prescriptions prior to admission  Medication Sig Dispense Refill Last Dose  . aspirin-acetaminophen-caffeine (EXCEDRIN MIGRAINE) 250-250-65 MG tablet Take 1 tablet by mouth daily as needed for headache.   01/27/2016 at Unknown time  . bisacodyl (DULCOLAX) 5 MG EC tablet Take 5 mg by mouth daily as needed for moderate constipation.   01/27/2016 at Unknown time  . polyethylene glycol (MIRALAX / GLYCOLAX) packet Take 17 g by mouth daily.   01/27/2016 at Unknown time   Scheduled:  . enoxaparin (LOVENOX) injection  40 mg Subcutaneous Q24H  . sodium chloride flush  3 mL Intravenous Q12H    Assessment: 20 y.o male with a PMH of congenital heart disease and chronic headaches who was admitted 01/28/16 with abdominal pain and shortness of breath. In ED found to have an elevated d-dimer 3.11. CT angiogram Extensive right-sided pulmonary embolus and  Questionable thrombus in the right ventricle as well.  Pharmacist consulted to start IV heparin infusion for PE. Baseline  INR 1.37, no PTT done.  CBC wnl No bleeding noted.  Not on anticoagulation prior to admit.  Goal of Therapy:  Heparin level 0.3-0.7 units/ml Monitor platelets by anticoagulation protocol: Yes   Plan:  Heparin 5000 units IV bolus x1 now  Heparin IV infusion at 1400 units/hr now  Heparin level 6 hours after heparin started. Daily heparin level and CBC.    Thank you for allowing pharmacy to be part of this patients care team. Micheal Leonard, RPh Clinical Pharmacist Pager: [phone] 01/29/2016,9:34 AM

## 2016-01-29 NOTE — Consult Note (Signed)
CARDIOLOGY CONSULT NOTE       Patient ID: Micheal Leonard MRN: [id number] DOB/AGE: Dec 11, 1995 20 y.o.  Admit date: 01/28/2016 Referring Physician:  Rama Primary Physician: No primary care provider on file. Primary Cardiologist:  [facility]  Reason for Consultation:  Congenital Heart Dx with PE  Principal Problem:   Pulmonary embolism (HCC) Active Problems:   Cardiac anomaly, congenital   Abdominal pain   SOB (shortness of breath)   Headache   Tachypnea   Decreased appetite   HPI:  20 y.o. followed [facility]. History of tricuspid atresia with multiple surgeries at young age. [facility] followed by extra cardiac Fontan.  MPA tied off. Had last  Office visit there in May MRI with patent tunnel and isolated RV normal pulmonary arteries.  No left sided disease. Has been very sedentary living with mother in [place] Not Working or having any kind of activity. Increasing abdominal pain and dyspnea over last week.  D dimer elevated and CTA reviewed.  Shows extensive clot in RPA extending to my Eye into the entire extra cardiac fontan tunnel.  Currently on heparin. . Patient reports that he has been having abdominal pain for about 3 weeks. It is located in the right upper quadrant, 10 out of 10 in severity, sharp, constant, nonradiating. It is aggravated by laying down. It has worsened today. It is associated with nausea, but no vomiting or diarrhea. Patient does not have subjective fever or chills, but his temperature is 100.3 on admission.  CT with no acute abdominal process and US with normal GB  ROS All other systems reviewed and negative except as noted above  Past Medical History  Diagnosis Date  . Hypertension   . Cardiac anomaly, congenital   . Tricuspid atresia and stenosis, congenital     Family History  Problem Relation Age of Onset  . Hypertension Father     Social History   Social History  . Marital Status: Single    Spouse Name: N/A  . Number of  Children: N/A  . Years of Education: N/A   Occupational History  . Not on file.   Social History Main Topics  . Smoking status: Never Smoker   . Smokeless tobacco: Not on file  . Alcohol Use: No  . Drug Use: Not on file  . Sexual Activity: Not on file   Other Topics Concern  . Not on file   Social History Narrative    Past Surgical History  Procedure Laterality Date  . Cardiac surgery      [facility] a [facility]      . sodium chloride flush  3 mL Intravenous Q12H   . sodium chloride 10 mL/hr at 01/29/16 1430  . heparin 16 Units/kg/hr (01/29/16 1200)    Physical Exam: Blood pressure 127/64, pulse 99, temperature 98.7 F (37.1 C), temperature source Oral, resp. rate 26, height 6' (1.829 m), weight 189 lb 8 oz (85.957 kg), SpO2 96 %.   Affect  Flat  Healthy:  appears stated age HEENT: normal Neck supple with no adenopathy JVP normal no bruits no thyromegaly Lungs clear with no wheezing and good diaphragmatic motion Heart:  S1/S2 no murmur, no rub, gallop or click PMI normal Abdomen: diffuse tenderness to palpation no rebound  no bruit.  No HSM or HJR Distal pulses intact with no bruits No edema Neuro non-focal Skin warm and dry No muscular weakness   Labs:   Lab Results  Component Value Date  WBC 7.8 01/28/2016   HGB 15.1 01/28/2016   HCT 43.9 01/28/2016   MCV 88.0 01/28/2016   PLT 277 01/28/2016    Recent Labs Lab 01/29/16 0430  NA 133*  K 3.5  CL 102  CO2 23  BUN 7  CREATININE 0.79  CALCIUM 8.6*  PROT 7.8  BILITOT 1.8*  ALKPHOS 84  ALT 20  AST 24  GLUCOSE 129*   Lab Results  Component Value Date   CKTOTAL 121 01/29/2016   TROPONINI <0.03 01/28/2016   No results found for: CHOL No results found for: HDL No results found for: LDLCALC No results found for: TRIG No results found for: CHOLHDL No results found for: LDLDIRECT    Radiology: Ct Angio Chest Pe W Or Wo Contrast  01/29/2016  CLINICAL DATA:  Shortness of  breath for 2 days EXAM: CT ANGIOGRAPHY CHEST WITH CONTRAST TECHNIQUE: Multidetector CT imaging of the chest was performed using the standard protocol during bolus administration of intravenous contrast. Multiplanar CT image reconstructions and MIPs were obtained to evaluate the vascular anatomy. CONTRAST:  100 mL Isovue 370 nonionic COMPARISON:  Chest radiograph January 28, 2016 FINDINGS: Cardiovascular: There is extensive pulmonary embolism arising from the main right pulmonary artery with extension into most of the right-sided pulmonary arterial branches. Thrombus in the right main pulmonary artery expands the vessel. No pulmonary embolus to the left is noted. There is apparent thrombus in the right ventricle as well. The right ventricle to left ventricle diameter ratio is less than 0.9 which is not indicative of right heart strain. There is no appreciable thoracic aortic aneurysm or dissection. The visualized great vessels appear normal. Pericardium is not thickened. Mediastinum/Nodes: Visualized thyroid appears normal. There is no appreciable thoracic adenopathy. Lungs/Pleura: There is no edema or consolidation. Upper Abdomen: Visualized upper abdominal structures appear unremarkable. Musculoskeletal: There are no blastic or lytic bone lesions. Review of the MIP images confirms the above findings. IMPRESSION: Extensive right-sided pulmonary embolus with pulmonary embolism arising from the right main pulmonary artery extending into multiple branches on the right. There is expansion of the right main pulmonary artery due to a localized thrombus. There is questionable thrombus in the right ventricle as well. Right ventricular to left ventricular diameter is less than 0.9, not meeting criteria for right heart strain. No edema or consolidation. Critical Value/emergent results were called by telephone at the time of interpretation on 01/29/2016 at 8:49 am to Dr. Darnelle Catalan, hospitalist , who verbally acknowledged these results.  Electronically Signed   By: Bretta Bang III M.D.   On: 01/29/2016 08:51   Ct Abdomen Pelvis W Contrast  01/29/2016  CLINICAL DATA:  Intermittent abdominal pain for 2 weeks.  Nausea. EXAM: CT ABDOMEN AND PELVIS WITH CONTRAST TECHNIQUE: Multidetector CT imaging of the abdomen and pelvis was performed using the standard protocol following bolus administration of intravenous contrast. CONTRAST:  ISOVUE-300 IOPAMIDOL (ISOVUE-300) INJECTION 61% COMPARISON:  None. FINDINGS: Small pleural effusions, right greater than left, identified. Evidence of previous cardiac surgery with a conduit or stent in the inferior vena cava. No other acute abnormalities seen within the lower chest. No free air or free fluid. There is mild periportal edema which is nonspecific. The liver and portal vein are otherwise normal. The gallbladder is unremarkable. The spleen is prominent measuring 13 cm in cranial caudal dimension. The spleen is otherwise normal. The adrenal glands, pancreas, and kidneys are normal. The abdominal aorta is normal in appearance. No adenopathy. The stomach and small bowel are  unremarkable. The colon and appendix are normal. No other abnormalities in the abdomen. The pelvis demonstrates no adenopathy or mass. The bladder, prostate, and seminal vesicles are normal. Visualized bones are unremarkable. IMPRESSION: 1. Previous cardiac surgery.  Tiny bilateral pleural effusions. 2. Mild periportal edema, nonspecific. 3. No other acute abnormalities. Electronically Signed   By: Gerome Sam III M.D   On: 01/29/2016 00:53   US Abdomen Limited  01/28/2016  CLINICAL DATA:  Right upper quadrant pain EXAM: US ABDOMEN LIMITED - RIGHT UPPER QUADRANT COMPARISON:  None. FINDINGS: Gallbladder: No gallstones or wall thickening visualized. No sonographic Murphy sign noted by sonographer. Common bile duct: Diameter: 2 mm Liver: No focal lesion identified. Within normal limits in parenchymal echogenicity. Antegrade flow  in the imaged portal venous system. IMPRESSION: Negative exam. Electronically Signed   By: Marnee Spring M.D.   On: 01/28/2016 22:55   Dg Abd Acute W/chest  01/28/2016  CLINICAL DATA:  Abdominal pain for 3 months. Intermittent headache for 2 weeks EXAM: DG ABDOMEN ACUTE W/ 1V CHEST COMPARISON:  None. FINDINGS: There is no evidence of dilated bowel loops or free intraperitoneal air. No radiopaque calculi or other significant radiographic abnormality is seen. Heart size and mediastinal contours are within normal limits. Mild bilateral interstitial thickening. No pleural effusion or pneumothorax. IMPRESSION: Negative abdominal radiographs. Mild bilateral interstitial prominence which may reflect mild pulmonary vascular congestion. Electronically Signed   By: Elige Ko   On: 01/28/2016 20:54    EKG:  ST rate 128  LVH    ASSESSMENT AND PLAN:  PE: Etiology not clear. The RV is isolated from tricuspid [place] and having MPA sewn shut. Concern that abdominal pain is from essentially IVC obstruction as the Extra cardiac fontan appears to be full of clot.  Given young age and likely non compliance with coumadin and f/u labs feel NOAC would be better Rx.  However discussed With father and feel he is best suited for transfer to [facility] as the shunt may need to be revised or possibly intervened on and advanced MRI imaging for congenital heart Disease bets done there. Have been trying to contact Dr Hyacinth Meeker [facility] and will try to arrange transfer.    Charlton Haws   Signed: Charlton Haws 01/29/2016, 3:10 PM

## 2016-01-29 NOTE — ED Notes (Signed)
PATIENT REFUSED BLOOD GAS, EDP AWARE.

## 2016-01-29 NOTE — H&P (Signed)
History and Physical    Micheal Leonard WJX:[id number] DOB: [birth year] DOA: 01/28/2016  Referring MD/NP/PA:   PCP: No primary care provider on file.   Patient coming from:  The patient is coming from home.  At baseline, pt is independent for most of ADL.     Chief Complaint: SOB, abdominal pain   HPI: Micheal Leonard is a 20 y.o. 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  Patient reports that he has been having abdominal pain for about 3 weeks. It is located in the right upper quadrant, 10 out of 10 in severity, sharp, constant, nonradiating. It is aggravated by laying down. It has worsened today. It is associated with nausea, but no vomiting or diarrhea. Patient does not have subjective fever or chills, but his temperature is 100.3 on admission. There was concern for possible food poisoning after patient ate food that was left on the counter. However symptoms have persisted. He has tried Miralax and Dulcolax with minimal relief of symptoms. He also has shortness of breath, but no chest pain, cough. He he had headache earlier, which has completely resolved. No neck pain or neck rigidity. Patient denies symptoms of UTI. No unilateral weakness. No rashes. No leg edema or tenderness over calf areas.  ED Course: pt was found to have positive d-dimer 3.1, WBC 7.8, temperature 100.3, tachycardia, tachypnea, oxygen saturation 96%, lactate is 1.46, troponin negative, BNP 13.1, lipase 22, urinalysis positive with trace amount of leukocytes and a positive for nitrate.  # CT abdomen/pelvis showed revious cardiac surgery, tiny bilateral pleural effusions, mild periportal edema, nonspecific. # US-abd: negative # X-ray of acute abd/chest showed mild bilateral interstitial prominence which may reflect mild pulmonary vascular  congestion.  Review of Systems:   General: has fevers, no chills, no changes in body weight, has poor appetite, has fatigue HEENT: no blurry vision, hearing changes or sore throat Pulm: has dyspnea, no coughing, wheezing CV: no chest pain, no palpitations Abd: has nausea, abdominal pain, no diarrhea, constipation GU: no dysuria, burning on urination, increased urinary frequency, hematuria  Ext: no leg edema Neuro: no unilateral weakness, numbness, or tingling, no vision change or hearing loss Skin: no rash MSK: No muscle spasm, no deformity, no limitation of range of movement in spin Heme: No easy bruising.  Travel history: No recent long distant travel.  Allergy: No Known Allergies  Past Medical History  Diagnosis Date  . Hypertension   . Cardiac anomaly, congenital     Past Surgical History  Procedure Laterality Date  . Cardiac surgery      fontane procedure at [facility] as a child     Social History:  reports that he has never smoked. He does not have any smokeless tobacco history on file. He reports that he does not drink alcohol. His drug history is not on file.  Family History:  Family History  Problem Relation Age of Onset  . Hypertension Father      Prior to Admission medications   Medication Sig Start Date End Date Taking? Authorizing Provider  aspirin-acetaminophen-caffeine (EXCEDRIN MIGRAINE) [phone] MG tablet Take 1 tablet by mouth daily as needed for headache.   Yes Historical Provider, MD  bisacodyl (DULCOLAX) 5 MG EC tablet Take 5 mg by mouth daily as needed for moderate constipation.   Yes Historical Provider, MD  polyethylene glycol (  MIRALAX / GLYCOLAX) packet Take 17 g by mouth daily.   Yes Historical Provider, MD    Physical Exam: Filed Vitals:   01/29/16 0242 01/29/16 0301 01/29/16 0307 01/29/16 0359  BP:  132/84  123/60  Pulse: 95  99 113  Temp:    101 F (38.3 C)  TempSrc:    Oral  Resp: Height:    6' (1.829 m)  Weight:    85.957  kg (189 lb 8 oz)  SpO2: 99%  98% 93%   General: Not in acute distress HEENT:       Eyes: PERRL, EOMI, no scleral icterus.       ENT: No discharge from the ears and nose, no pharynx injection, no tonsillar enlargement.        Neck: No JVD, no bruit, no mass felt. Heme: No neck lymph node enlargement. Cardiac: S1/S2, RRR, No murmurs, No gallops or rubs. Pulm:  No rales, wheezing, rhonchi or rubs. Abd: Soft, nondistended, mild tender over RUQ, no rebound pain, no organomegaly, BS present. GU: No hematuria Ext: No pitting leg edema bilaterally. 2+DP/PT pulse bilaterally. Musculoskeletal: No joint deformities, No joint redness or warmth, no limitation of ROM in spin. Skin: No rashes.  Neuro: Alert, oriented X3, cranial nerves II-XII grossly intact, moves all extremities normally. Psych: Patient is not psychotic, no suicidal or hemocidal ideation.  Labs on Admission: I have personally reviewed following labs and imaging studies  CBC:  Recent Labs Lab 01/28/16 1703  WBC 7.8  HGB 15.1  HCT 43.9  MCV 88.0  PLT 277   Basic Metabolic Panel:  Recent Labs Lab 01/28/16 1703 01/29/16 0430  NA 135 133*  K 4.1 3.5  CL 101 102  CO2 25 23  GLUCOSE 115* 129*  BUN 8 7  CREATININE 0.89 0.79  CALCIUM 9.5 8.6*   GFR: Estimated Creatinine Clearance: 163 mL/min (by C-G formula based on Cr of 0.79). Liver Function Tests:  Recent Labs Lab 01/28/16 1703 01/29/16 0430  AST 26 24  ALT 24 20  ALKPHOS 98 84  BILITOT 1.6* 1.8*  PROT 8.9* 7.8  ALBUMIN 4.0 3.3*    Recent Labs Lab 01/28/16 1703  LIPASE 22   No results for input(s): AMMONIA in the last 168 hours. Coagulation Profile:  Recent Labs Lab 01/29/16 0430  INR 1.37   Cardiac Enzymes:  Recent Labs Lab 01/28/16 2225 01/29/16 0155  CKTOTAL  --  121  TROPONINI <0.03  --    BNP (last 3 results) No results for input(s): PROBNP in the last 8760 hours. HbA1C: No results for input(s): HGBA1C in the last 72  hours. CBG: No results for input(s): GLUCAP in the last 168 hours. Lipid Profile: No results for input(s): CHOL, HDL, LDLCALC, TRIG, CHOLHDL, LDLDIRECT in the last 72 hours. Thyroid Function Tests: No results for input(s): TSH, T4TOTAL, FREET4, T3FREE, THYROIDAB in the last 72 hours. Anemia Panel: No results for input(s): VITAMINB12, FOLATE, FERRITIN, TIBC, IRON, RETICCTPCT in the last 72 hours. Urine analysis:    Component Value Date/Time   COLORURINE AMBER* 01/29/2016 0057   APPEARANCEUR CLEAR 01/29/2016 0057   LABSPEC 1.030 01/29/2016 0057   PHURINE 6.0 01/29/2016 0057   GLUCOSEU NEGATIVE 01/29/2016 0057   HGBUR NEGATIVE 01/29/2016 0057   BILIRUBINUR MODERATE* 01/29/2016 0057   KETONESUR 40* 01/29/2016 0057   PROTEINUR 30* 01/29/2016 0057   NITRITE POSITIVE* 01/29/2016 0057   LEUKOCYTESUR TRACE* 01/29/2016 0057   Sepsis Labs: (procalcitonin:4,lacticidven:4) )No results found  for this or any previous visit (from the past 240 hour(s)).   Radiological Exams on Admission: Ct Abdomen Pelvis W Contrast  01/29/2016  CLINICAL DATA:  Intermittent abdominal pain for 2 weeks.  Nausea. EXAM: CT ABDOMEN AND PELVIS WITH CONTRAST TECHNIQUE: Multidetector CT imaging of the abdomen and pelvis was performed using the standard protocol following bolus administration of intravenous contrast. CONTRAST:  100mL ISOVUE-300 IOPAMIDOL (ISOVUE-300) INJECTION 61% COMPARISON:  None. FINDINGS: Small pleural effusions, right greater than left, identified. Evidence of previous cardiac surgery with a conduit or stent in the inferior vena cava. No other acute abnormalities seen within the lower chest. No free air or free fluid. There is mild periportal edema which is nonspecific. The liver and portal vein are otherwise normal. The gallbladder is unremarkable. The spleen is prominent measuring 13 cm in cranial caudal dimension. The spleen is otherwise normal. The adrenal glands, pancreas, and kidneys are  normal. The abdominal aorta is normal in appearance. No adenopathy. The stomach and small bowel are unremarkable. The colon and appendix are normal. No other abnormalities in the abdomen. The pelvis demonstrates no adenopathy or mass. The bladder, prostate, and seminal vesicles are normal. Visualized bones are unremarkable. IMPRESSION: 1. Previous cardiac surgery.  Tiny bilateral pleural effusions. 2. Mild periportal edema, nonspecific. 3. No other acute abnormalities. Electronically Signed   By: Gerome Samavid  Williams III M.D   On: 01/29/2016 00:53   [place] Abdomen Limited  01/28/2016  CLINICAL DATA:  Right upper quadrant pain EXAM: US ABDOMEN LIMITED - RIGHT UPPER QUADRANT COMPARISON:  None. FINDINGS: Gallbladder: No gallstones or wall thickening visualized. No sonographic Murphy sign noted by sonographer. Common bile duct: Diameter: 2 mm Liver: No focal lesion identified. Within normal limits in parenchymal echogenicity. Antegrade flow in the imaged portal venous system. IMPRESSION: Negative exam. Electronically Signed   By: Marnee SpringJonathon  Watts M.D.   On: 01/28/2016 22:55   Dg Abd Acute W/chest  01/28/2016  CLINICAL DATA:  Abdominal pain for 3 months. Intermittent headache for 2 weeks EXAM: DG ABDOMEN ACUTE W/ 1V CHEST COMPARISON:  None. FINDINGS: There is no evidence of dilated bowel loops or free intraperitoneal air. No radiopaque calculi or other significant radiographic abnormality is seen. Heart size and mediastinal contours are within normal limits. Mild bilateral interstitial thickening. No pleural effusion or pneumothorax. IMPRESSION: Negative abdominal radiographs. Mild bilateral interstitial prominence which may reflect mild pulmonary vascular congestion. Electronically Signed   By: Elige KoHetal  Patel   On: 01/28/2016 20:54     EKG: Independently reviewed. Sinus rhythm, tachycardia, QTC 490, T-wave inversion in V4-V6, I, aVL and III.  Assessment/Plan Principal Problem:   Abdominal pain Active Problems:    Cardiac anomaly, congenital   SOB (shortness of breath)   Headache   Tachypnea   Abdominal pain: CT scan of the abdomen/pelvis and abdominal ultrasound findings are not impressive. Lipase normal. Total bilirubin 1.6 with normal transaminases. Etiology is not clear. -will admit to tele bed for obs -prn oxycodone for pain and zofran for nausea -check Hepatitis panel land HIV ab -IVF: 2L NS and then 100 cc/h  Positive UA: Urinalysis positive with small moderate leukocytes and positive nitrate. Patient denies symptoms of UTI, but has abdominal pain and fever. -Will give 1 dose of fosfomycin -Follow-up urine culture and blood culture  SOB (shortness of breath): Etiology is not clear. Patient does not have chest pain or shortness of breath. X-ray of acute abdomen/pelvis showed mild bilateral interstitial prominence, but no infiltration. D-dimer is positive, 3.1-->will need  to r/o PE -will get CTA to r/o PE -When necessary albuterol nebulizer for shortness of breath   Headache: chronic issue. Has resolved now. -continue home Excedrin   DVT ppx: SQ Lovenox Code Status: Full code Family Communication:  Yes, patient's mother at bed side Disposition Plan:  Anticipate discharge back to previous home environment Consults called:  none Admission status: Obs / tele    Date of Service 01/29/2016    Lorretta Harp [facility] Pager [phone]  If 7PM-7AM, please contact night-coverage [email or web address] Password [email or web address] 01/29/2016, 7:07 AM

## 2016-01-29 NOTE — ED Notes (Signed)
Patient aware of need of urine sample. Gave drink.

## 2016-01-29 NOTE — Progress Notes (Signed)
I [facility] including [facility]ager and transfer lines Have also [facility] Although I have not heard back from anyone in [place] I believe The transfer is being done Have spoken with Dr Darnelle Catalanama to do d/c summary  Micheal Leonard

## 2016-01-29 NOTE — Progress Notes (Signed)
Progress Note    Micheal Leonard  ZOX:[id number] DOB: [birth year]  DOA: 01/28/2016 PCP: No primary care provider on file.    Brief Narrative:   Micheal Leonard is an 20 y.o. male with a PMH of congenital heart disease and chronic headaches who was admitted 01/28/16 with abdominal pain and shortness of breath. Upon initial evaluation in the ED, the patient was found to be febrile, tachycardic, tachypneic with an elevated d-dimer.  Assessment/Plan:   Principal Problem:   Acute pulmonary embolism Extensive right-sided pulmonary embolus with pulmonary embolism arising from the right main pulmonary artery extending into multiple branches on the right. There is expansion of the right main pulmonary artery due to a localized thrombus. There is questionable thrombus in the right ventricle as well. Right ventricular to left ventricular diameter is less than 0.9, not meeting criteria for right heart strain. The patient has a history of congenital heart disease and had a Fontan Procedure done.  Active Problems:     Abdominal pain/UTI/fever CT of the abdomen/pelvis and abdominal ultrasound without significant abnormalities. Serum lipase WNL. Follow-up hepatitis panel and HIV testing. Given a dose of fosfomycin for treatment of probable UTI given positive nitrites and leukocytes on urinalysis. Continue supportive care. Follow-up blood cultures/urine culture.    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 Chronic anticoagulation is recommended for all Fontan patients with a history of thromboembolism.    Headache Chronic issue, pain management as needed.   Family Communication/Anticipated D/C date and plan/Code Status   DVT prophylaxis: On treatment dose IV heparin. Code Status: Full Code.  Family Communication: Mother updated at bedside. Disposition Plan: Home when  2-D echo completed, cardiology consultation with recommendations obtained.   Medical Consultants:    Cardiology   Procedures:   2-D echo with bubble study  Anti-Infectives:   None.  Subjective:   Micheal Leonard reports abdominal pain and shortness of breath. Continues to be febrile. No nausea or vomiting. Wants to eat.  Objective:    Filed Vitals:   01/29/16 0242 01/29/16 0301 01/29/16 0307 01/29/16 0359  BP:  132/84  123/60  Pulse: 95  99 113  Temp:    101 F (38.3 C)  TempSrc:    Oral  Resp: Height:    6' (1.829 m)  Weight:    85.957 kg (189 lb 8 oz)  SpO2: 99%  98% 93%   No intake or output data in the 24 hours ending 01/29/16 0802 Filed Weights   01/28/16 1652 01/29/16 0359  Weight: 84.097 kg (185 lb 6.4 oz) 85.957 kg (189 lb 8 oz)    Exam: General exam: Appears calm and comfortable.  Respiratory system: Clear to auscultation. Respiratory effort normal. Cardiovascular system: S1 & S2 heard, RRR. No JVD,  rubs, gallops or clicks. No murmurs. Gastrointestinal system: Abdomen is nondistended, soft and nontender. No organomegaly or masses felt. Normal bowel sounds heard. Central nervous system: Alert and oriented. No focal neurological deficits. Extremities: No clubbing, edema, or cyanosis. Skin: No rashes, lesions or ulcers. Midline sternal scar. Psychiatry: Judgement and insight appear normal. Mood & affect appropriate.   Data Reviewed:   I have personally reviewed following labs and imaging studies:  Labs: Basic Metabolic Panel:  Recent Labs Lab 01/28/16 1703 01/29/16 0430  NA 135 133*  K 4.1 3.5  CL 101 102  CO2 25 23  GLUCOSE 115* 129*  BUN 8 7  CREATININE 0.89 0.79  CALCIUM 9.5 8.6*   GFR Estimated Creatinine Clearance: 163 mL/min (by C-G formula based on Cr of 0.79). Liver Function Tests:  Recent Labs Lab 01/28/16 1703 01/29/16 0430  AST 26 24  ALT 24 20  ALKPHOS 98 84  BILITOT 1.6* 1.8*  PROT 8.9* 7.8  ALBUMIN  4.0 3.3*    Recent Labs Lab 01/28/16 1703  LIPASE 22   No results for input(s): AMMONIA in the last 168 hours. Coagulation profile  Recent Labs Lab 01/29/16 0430  INR 1.37    CBC:  Recent Labs Lab 01/28/16 1703  WBC 7.8  HGB 15.1  HCT 43.9  MCV 88.0  PLT 277   Cardiac Enzymes:  Recent Labs Lab 01/28/16 2225 01/29/16 0155  CKTOTAL  --  121  TROPONINI <0.03  --    D-Dimer:  Recent Labs  01/29/16 0430  DDIMER 3.11*   Sepsis Labs:  Recent Labs Lab 01/28/16 1703 01/28/16 2307  WBC 7.8  --   LATICACIDVEN  --  1.46   Urine analysis:    Component Value Date/Time   COLORURINE AMBER* 01/29/2016 0057   APPEARANCEUR CLEAR 01/29/2016 0057   LABSPEC 1.030 01/29/2016 0057   PHURINE 6.0 01/29/2016 0057   GLUCOSEU NEGATIVE 01/29/2016 0057   HGBUR NEGATIVE 01/29/2016 0057   BILIRUBINUR MODERATE* 01/29/2016 0057   KETONESUR 40* 01/29/2016 0057   PROTEINUR 30* 01/29/2016 0057   NITRITE POSITIVE* 01/29/2016 0057   LEUKOCYTESUR TRACE* 01/29/2016 0057   Microbiology No results found for this or any previous visit (from the past 240 hour(s)).  Radiology: Ct Abdomen Pelvis W Contrast  01/29/2016  CLINICAL DATA:  Intermittent abdominal pain for 2 weeks.  Nausea. EXAM: CT ABDOMEN AND PELVIS WITH CONTRAST TECHNIQUE: Multidetector CT imaging of the abdomen and pelvis was performed using the standard protocol following bolus administration of intravenous contrast. CONTRAST:  100mL ISOVUE-300 IOPAMIDOL (ISOVUE-300) INJECTION 61% COMPARISON:  None. FINDINGS: Small pleural effusions, right greater than left, identified. Evidence of previous cardiac surgery with a conduit or stent in the inferior vena cava. No other acute abnormalities seen within the lower chest. No free air or free fluid. There is mild periportal edema which is nonspecific. The liver and portal vein are otherwise normal. The gallbladder is unremarkable. The spleen is prominent measuring 13 cm in cranial  caudal dimension. The spleen is otherwise normal. The adrenal glands, pancreas, and kidneys are normal. The abdominal aorta is normal in appearance. No adenopathy. The stomach and small bowel are unremarkable. The colon and appendix are normal. No other abnormalities in the abdomen. The pelvis demonstrates no adenopathy or mass. The bladder, prostate, and seminal vesicles are normal. Visualized bones are unremarkable. IMPRESSION: 1. Previous cardiac surgery.  Tiny bilateral pleural effusions. 2. Mild periportal edema, nonspecific. 3. No other acute abnormalities. Electronically Signed   By: Gerome Samavid  Williams III M.D   On: 01/29/2016 00:53   [place] Abdomen Limited  01/28/2016  CLINICAL DATA:  Right upper quadrant pain EXAM: US ABDOMEN LIMITED - RIGHT UPPER QUADRANT COMPARISON:  None. FINDINGS: Gallbladder: No gallstones or wall thickening visualized. No sonographic Murphy sign noted by sonographer. Common bile duct: Diameter: 2 mm Liver: No focal lesion identified. Within normal limits in parenchymal echogenicity. Antegrade flow in the imaged portal venous system. IMPRESSION: Negative exam. Electronically Signed   By: Marnee SpringJonathon  Watts M.D.   On: 01/28/2016 22:55   Dg Abd Acute W/chest  01/28/2016  CLINICAL DATA:  Abdominal pain for 3 months. Intermittent headache for 2 weeks EXAM: DG ABDOMEN ACUTE W/ 1V CHEST COMPARISON:  None. FINDINGS: There is no evidence of dilated bowel loops or free intraperitoneal air. No radiopaque calculi or other significant radiographic abnormality is seen. Heart size and mediastinal contours are within normal limits. Mild bilateral interstitial thickening. No pleural effusion or pneumothorax. IMPRESSION: Negative abdominal radiographs. Mild bilateral interstitial prominence which may reflect mild pulmonary vascular congestion. Electronically Signed   By: Elige Ko   On: 01/28/2016 20:54    Medications:   . sodium chloride   Intravenous Once  . enoxaparin (LOVENOX) injection  40 mg  Subcutaneous Q24H  . iopamidol      . sodium chloride flush  3 mL Intravenous Q12H   Continuous Infusions:   Time spent: 35 minutes with > 50% of time discussing current diagnostic test results, clinical impression and plan of care as well as coordination of care with cardiologist.     RAMA,CHRISTINA  [facility] Pager [phone]. If unable to reach me by pager, please call my cell phone at [phone].  *Please refer to [email or web address], password [email or web address] to get updated schedule on who will round on this patient, as hospitalists switch teams weekly. If 7PM-7AM, please contact night-coverage at [email or web address], password [email or web address] for any overnight needs.  01/29/2016, 8:02 AM

## 2016-01-29 NOTE — Procedures (Signed)
Pt refused ABG.  RN and PA notified.

## 2016-01-29 NOTE — Progress Notes (Signed)
ANTICOAGULATION CONSULT NOTE - FOLLOW UP    HL = 0.39 (goal 0.3 - 0.7 units/mL) Heparin dosing weight = 86 kg   Assessment: 20 YOM continues on IV heparin for new PE and questionable thrombus in the right ventricle.  Heparin level is therapeutic; no bleeding reported.   Plan: - Increase heparin gtt slightly to 1450 units/hr - Check confirmatory HL    Micheal Leonard, PharmD, BCPS 01/29/2016, 7:15 PM

## 2016-01-29 NOTE — ED Notes (Signed)
Tried to call report x 2 more.

## 2016-01-29 NOTE — ED Notes (Signed)
Attempted report x 2 

## 2016-01-30 ENCOUNTER — Inpatient Hospital Stay (HOSPITAL_COMMUNITY): Payer: Commercial Managed Care - HMO

## 2016-01-30 DIAGNOSIS — R1010 Upper abdominal pain, unspecified: Secondary | ICD-10-CM | POA: Insufficient documentation

## 2016-01-30 DIAGNOSIS — I2699 Other pulmonary embolism without acute cor pulmonale: Secondary | ICD-10-CM

## 2016-01-30 LAB — HEPATITIS PANEL, ACUTE
HCV AB: 0.1 {s_co_ratio} (ref 0.0–0.9)
Hep A IgM: UNDETERMINED
Hep B C IgM: NEGATIVE
Hepatitis B Surface Ag: NEGATIVE

## 2016-01-30 LAB — GLUCOSE, CAPILLARY
GLUCOSE-CAPILLARY: 100 mg/dL — AB (ref 65–99)
Glucose-Capillary: 145 mg/dL — ABNORMAL HIGH (ref 65–99)

## 2016-01-30 LAB — CBC
HCT: 39 % (ref 39.0–52.0)
HEMOGLOBIN: 13.3 g/dL (ref 13.0–17.0)
MCH: 29.7 pg (ref 26.0–34.0)
MCHC: 34.1 g/dL (ref 30.0–36.0)
MCV: 87.1 fL (ref 78.0–100.0)
Platelets: 238 10*3/uL (ref 150–400)
RBC: 4.48 MIL/uL (ref 4.22–5.81)
RDW: 12.8 % (ref 11.5–15.5)
WBC: 9.2 10*3/uL (ref 4.0–10.5)

## 2016-01-30 LAB — URINE CULTURE

## 2016-01-30 LAB — HEPARIN LEVEL (UNFRACTIONATED)
HEPARIN UNFRACTIONATED: 0.39 [IU]/mL (ref 0.30–0.70)
Heparin Unfractionated: 0.38 IU/mL (ref 0.30–0.70)

## 2016-01-30 MED ORDER — HEPARIN (PORCINE) IN NACL 100-0.45 UNIT/ML-% IJ SOLN: 1450.0000 [IU]/h | INTRAMUSCULAR | Status: DC

## 2016-01-30 NOTE — Progress Notes (Signed)
ANTICOAGULATION CONSULT NOTE - Follow Up Consult  Pharmacy Consult for heparin Indication: pulmonary embolus  Labs:  Recent Labs  01/28/16 1703 01/28/16 2225 01/29/16 0155 01/29/16 0430 01/29/16 1743 01/30/16 0126 01/30/16 1035  HGB 15.1  --   --   --   --   --  13.3  HCT 43.9  --   --   --   --   --  39.0  PLT 277  --   --   --   --   --  238  LABPROT  --   --   --  16.9*  --   --   --   INR  --   --   --  1.37  --   --   --   HEPARINUNFRC  --   --   --   --  0.39 0.39 0.38  CREATININE 0.89  --   --  0.79  --   --   --   CKTOTAL  --   --  121  --   --   --   --   TROPONINI  --  <0.03  --   --   --   --   --     Assessment/Plan: 20yo male  With acute extensive pulmonary embolus and questionable thrombus in the right ventricle as well. Today the heparin remains therapeutic (x3), HL = 0.38 on  Heparin 1450 units/hr. CBC wnl No bleeding noted. Will continue heparin drip at current rate . [facility] cardiologist for further evaluation/management.  Thank you for allowing pharmacy to be part of this patients care team. [name] [name], RPh Clinical Pharmacist Pager: [phone] 01/30/2016,12:26 PM

## 2016-01-30 NOTE — Progress Notes (Signed)
Report called to [facility] staff at at [phone]. Patient originally assigned to room 4205 but once report was called they said he was going to a different room and would have to call me back.

## 2016-01-30 NOTE — Progress Notes (Signed)
[facility] transfer center called to check the status of the patient's transfer and bed assignment. The patient no longer requires ICU and will be able to transfer to a pediatric bed once one becomes available. They are anticipating discharges. The primary nurse here will be contacted when there is a bed available.

## 2016-01-30 NOTE — Progress Notes (Signed)
Patient ID: Micheal Leonard, male   DOB: 1996-02-02, 20 y.o.   MRN: [id number]    Subjective:  Still with exertional dyspnea Less abdominal pain eating breakfast  Objective:  Filed Vitals:   01/29/16 1838 01/29/16 2205 01/30/16 0030 01/30/16 0535  BP:  128/52  126/69  Pulse:  111  83  Temp: 99.6 F (37.6 C) 103.2 F (39.6 C) 99.4 F (37.4 C) 97.6 F (36.4 C)  TempSrc: Oral Oral Oral Oral  Resp:  24  20  Height:      Weight:    192 lb 12.8 oz (87.454 kg)  SpO2:  98%  99%    Intake/Output from previous day:  Intake/Output Summary (Last 24 hours) at 01/30/16 0847 Last data filed at 01/30/16 0759  Gross per 24 hour  Intake 1959.13 ml  Output    360 ml  Net 1599.13 ml    Physical Exam: Affect Flat  Thin black male  HEENT: normal Neck supple with no adenopathy JVP normal no bruits no thyromegaly Lungs clear with no wheezing and good diaphragmatic motion Heart:  S1/S2 no murmur, no rub, gallop or click PMI normal Abdomen: benighn, BS positve, no tenderness, no AAA no bruit.  No HSM or HJR Distal pulses intact with no bruits No edema Neuro non-focal Skin warm and dry No muscular weakness   Lab Results: Basic Metabolic Panel:  Recent Labs  78/29/56 1703 01/29/16 0430  NA 135 133*  K 4.1 3.5  CL 101 102  CO2 25 23  GLUCOSE 115* 129*  BUN 8 7  CREATININE 0.89 0.79  CALCIUM 9.5 8.6*   Liver Function Tests:  Recent Labs  01/28/16 1703 01/29/16 0430  AST 26 24  ALT 24 20  ALKPHOS 98 84  BILITOT 1.6* 1.8*  PROT 8.9* 7.8  ALBUMIN 4.0 3.3*    Recent Labs  01/28/16 1703  LIPASE 22   CBC:  Recent Labs  01/28/16 1703  WBC 7.8  HGB 15.1  HCT 43.9  MCV 88.0  PLT 277   Cardiac Enzymes:  Recent Labs  01/28/16 2225 01/29/16 0155  CKTOTAL  --  121  TROPONINI <0.03  --    BNP: Invalid input(s): POCBNP D-Dimer:  Recent Labs  01/29/16 0430  DDIMER 3.11*    Imaging: Ct Angio Chest Pe W Or Wo Contrast  01/29/2016  CLINICAL DATA:   Shortness of breath for 2 days EXAM: CT ANGIOGRAPHY CHEST WITH CONTRAST TECHNIQUE: Multidetector CT imaging of the chest was performed using the standard protocol during bolus administration of intravenous contrast. Multiplanar CT image reconstructions and MIPs were obtained to evaluate the vascular anatomy. CONTRAST:  100 mL Isovue 370 nonionic COMPARISON:  Chest radiograph January 28, 2016 FINDINGS: Cardiovascular: There is extensive pulmonary embolism arising from the main right pulmonary artery with extension into most of the right-sided pulmonary arterial branches. Thrombus in the right main pulmonary artery expands the vessel. No pulmonary embolus to the left is noted. There is apparent thrombus in the right ventricle as well. The right ventricle to left ventricle diameter ratio is less than 0.9 which is not indicative of right heart strain. There is no appreciable thoracic aortic aneurysm or dissection. The visualized great vessels appear normal. Pericardium is not thickened. Mediastinum/Nodes: Visualized thyroid appears normal. There is no appreciable thoracic adenopathy. Lungs/Pleura: There is no edema or consolidation. Upper Abdomen: Visualized upper abdominal structures appear unremarkable. Musculoskeletal: There are no blastic or lytic bone lesions. Review of the MIP images confirms the above findings.  IMPRESSION: Extensive right-sided pulmonary embolus with pulmonary embolism arising from the right main pulmonary artery extending into multiple branches on the right. There is expansion of the right main pulmonary artery due to a localized thrombus. There is questionable thrombus in the right ventricle as well. Right ventricular to left ventricular diameter is less than 0.9, not meeting criteria for right heart strain. No edema or consolidation. Critical Value/emergent results were called by telephone at the time of interpretation on 01/29/2016 at 8:49 am to Dr. Darnelle Catalan, hospitalist , who verbally acknowledged  these results. Electronically Signed   By: Bretta Bang III M.D.   On: 01/29/2016 08:51   Ct Abdomen Pelvis W Contrast  01/29/2016  CLINICAL DATA:  Intermittent abdominal pain for 2 weeks.  Nausea. EXAM: CT ABDOMEN AND PELVIS WITH CONTRAST TECHNIQUE: Multidetector CT imaging of the abdomen and pelvis was performed using the standard protocol following bolus administration of intravenous contrast. CONTRAST:  ISOVUE-300 IOPAMIDOL (ISOVUE-300) INJECTION 61% COMPARISON:  None. FINDINGS: Small pleural effusions, right greater than left, identified. Evidence of previous cardiac surgery with a conduit or stent in the inferior vena cava. No other acute abnormalities seen within the lower chest. No free air or free fluid. There is mild periportal edema which is nonspecific. The liver and portal vein are otherwise normal. The gallbladder is unremarkable. The spleen is prominent measuring 13 cm in cranial caudal dimension. The spleen is otherwise normal. The adrenal glands, pancreas, and kidneys are normal. The abdominal aorta is normal in appearance. No adenopathy. The stomach and small bowel are unremarkable. The colon and appendix are normal. No other abnormalities in the abdomen. The pelvis demonstrates no adenopathy or mass. The bladder, prostate, and seminal vesicles are normal. Visualized bones are unremarkable. IMPRESSION: 1. Previous cardiac surgery.  Tiny bilateral pleural effusions. 2. Mild periportal edema, nonspecific. 3. No other acute abnormalities. Electronically Signed   By: Gerome Sam III M.D   On: 01/29/2016 00:53   US Abdomen Limited  01/28/2016  CLINICAL DATA:  Right upper quadrant pain EXAM: US ABDOMEN LIMITED - RIGHT UPPER QUADRANT COMPARISON:  None. FINDINGS: Gallbladder: No gallstones or wall thickening visualized. No sonographic Murphy sign noted by sonographer. Common bile duct: Diameter: 2 mm Liver: No focal lesion identified. Within normal limits in parenchymal echogenicity.  Antegrade flow in the imaged portal venous system. IMPRESSION: Negative exam. Electronically Signed   By: Marnee Spring M.D.   On: 01/28/2016 22:55   Dg Abd Acute W/chest  01/28/2016  CLINICAL DATA:  Abdominal pain for 3 months. Intermittent headache for 2 weeks EXAM: DG ABDOMEN ACUTE W/ 1V CHEST COMPARISON:  None. FINDINGS: There is no evidence of dilated bowel loops or free intraperitoneal air. No radiopaque calculi or other significant radiographic abnormality is seen. Heart size and mediastinal contours are within normal limits. Mild bilateral interstitial thickening. No pleural effusion or pneumothorax. IMPRESSION: Negative abdominal radiographs. Mild bilateral interstitial prominence which may reflect mild pulmonary vascular congestion. Electronically Signed   By: Elige Ko   On: 01/28/2016 20:54    Cardiac Studies:  ECG:  SR LVH     Telemetry:  NSR  01/30/2016   Echo:   Medications:   . sodium chloride flush  3 mL Intravenous Q12H     . sodium chloride 10 mL/hr at 01/29/16 1800  . 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  Conduit is full of thrombus going into RPA  with PE Called [facility] again this am and will have [facility] transfer to there facility. Continue heparin. Given limited ability of patient to process Information and compliance concerns would use NOAC at PE doses  Will likely need advanced imaging with cardiac MRI to further Assess the extent of thrombus in conduit and see if thrombolysis or surgical intervention would be needed   Micheal Leonard 01/30/2016, 8:47 AM

## 2016-01-30 NOTE — Progress Notes (Signed)
Patient's mother requested to transfer to [facility] this morning due to fatigue. Charge Nurse on Pediatric ICU was notified of mother's request.

## 2016-01-30 NOTE — Progress Notes (Signed)
ANTICOAGULATION CONSULT NOTE - Follow Up Consult  Pharmacy Consult for heparin Indication: pulmonary embolus  Labs:  Recent Labs  01/28/16 1703 01/28/16 2225 01/29/16 0155 01/29/16 0430 01/29/16 1743 01/30/16 0126  HGB 15.1  --   --   --   --   --   HCT 43.9  --   --   --   --   --   PLT 277  --   --   --   --   --   LABPROT  --   --   --  16.9*  --   --   INR  --   --   --  1.37  --   --   HEPARINUNFRC  --   --   --   --  0.39 0.39  CREATININE 0.89  --   --  0.79  --   --   CKTOTAL  --   --  121  --   --   --   TROPONINI  --  <0.03  --   --   --   --     Assessment/Plan: 20yo male therapeutic on heparin after rate change. Will continue gtt at current rate and confirm stable with additional level.   Vernard GamblesVeronda Taffie Eckmann, PharmD, BCPS  01/30/2016,3:12 AM

## 2016-01-30 NOTE — Progress Notes (Signed)
Progress Note    Micheal Hilasaiah Peil  ZOX:[id number] DOB: [birth year]  DOA: 01/28/2016 PCP: No PCP Per Patient    Brief Narrative:   Micheal Leonard is an 20 y.o. male with a PMH of congenital heart disease and chronic headaches who was admitted 01/28/16 with abdominal pain and shortness of breath. Upon initial evaluation in the ED, the patient was found to be febrile, tachycardic, tachypneic with an elevated d-dimer.  Assessment/Plan:   Principal Problem:  Acute pulmonary embolism Extensive right-sided pulmonary embolus with pulmonary embolism arising from the right main pulmonary artery extending into multiple branches on the right. There is expansion of the right main pulmonary artery due to a localized thrombus. There is questionable thrombus in the right ventricle as well. Right ventricular to left ventricular diameter is less than 0.9, not meeting criteria for right heart strain. The patient has a history of congenital heart disease and had a Fontan Procedure done.Cardiology consulted given his complex cardiac anatomy. Evaluated by Dr. Eden EmmsNishan with [facility]. Apparently the patient refused transfer last night, but is willing to go today and the plan is to discharge him when a bed is available. No change to his previously dictated discharge summary other than the heparin dosing has changed.  Active Problems:  Abdominal pain/UTI/fever CT of the abdomen/pelvis and abdominal ultrasound without significant abnormalities. Serum lipase WNL. Follow-up hepatitis panel and HIV testing. Given a dose of fosfomycin for treatment of probable UTI given positive nitrites and leukocytes on urinalysis. Continue supportive care. Follow-up blood cultures/urine culture. 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 Chronic anticoagulation is recommended for all Fontan patients with a history of thromboembolism.   Headache Chronic issue, pain management as needed.   Family Communication/Anticipated D/C date and plan/Code Status   DVT prophylaxis: On treatment dose IV heparin. Code Status: Full Code.  Family Communication: Mother updated at bedside. Disposition Plan: Home when 2-D echo completed, cardiology consultation with recommendations obtained.   Medical Consultants:    Cardiology   Procedures:   2-D echo with bubble study  Anti-Infectives:   None.  Subjective:   Micheal Hilasaiah Pink reports less abdominal pain, denies shortness of breath. Continues to be febrile. No nausea or vomiting.   Objective:    Filed Vitals:   01/30/16 0030 01/30/16 0535 01/30/16 1225 01/30/16 1345  BP:  126/69 109/76   Pulse:  83 114   Temp: 99.4 F (37.4 C) 97.6 F (36.4 C) 102.5 F (39.2 C) 99.7 F (37.6 C)  TempSrc: Oral Oral Oral Oral  Resp:  20 20   Height:      Weight:  87.454 kg (192 lb 12.8 oz)    SpO2:  99% 98%     Intake/Output Summary (Last 24 hours) at 01/30/16 1609 Last data filed at 01/30/16 0916  Gross per 24 hour  Intake 1447.13 ml  Output    240 ml  Net 1207.13 ml   Filed Weights   01/28/16 1652 01/29/16 0359 01/30/16 0535  Weight: 84.097 kg (185 lb 6.4 oz) 85.957 kg (189 lb 8 oz) 87.454 kg (192 lb 12.8 oz)    Exam: General exam: Appears calm and comfortable.  Respiratory system: Clear to auscultation. Respiratory effort normal. Cardiovascular  system: S1 & S2 heard, RRR. No JVD,  rubs, gallops or clicks. No murmurs. Gastrointestinal system: Abdomen is nondistended, soft and nontender. No organomegaly or masses felt. Normal bowel sounds heard. Central nervous system: Alert and oriented. No focal neurological deficits. Extremities: No clubbing, edema, or  cyanosis. Skin: No rashes, lesions or ulcers. Midline sternal scar. Psychiatry: Judgement and insight appear normal. Mood & affect appropriate.   Data Reviewed:   I have personally reviewed following labs and imaging studies:  Labs: Basic Metabolic Panel:  Recent Labs Lab 01/28/16 1703 01/29/16 0430  NA 135 133*  K 4.1 3.5  CL 101 102  CO2 25 23  GLUCOSE 115* 129*  BUN 8 7  CREATININE 0.89 0.79  CALCIUM 9.5 8.6*   GFR Estimated Creatinine Clearance: 163 mL/min (by C-G formula based on Cr of 0.79). Liver Function Tests:  Recent Labs Lab 01/28/16 1703 01/29/16 0430  AST 26 24  ALT 24 20  ALKPHOS 98 84  BILITOT 1.6* 1.8*  PROT 8.9* 7.8  ALBUMIN 4.0 3.3*    Recent Labs Lab 01/28/16 1703  LIPASE 22   No results for input(s): AMMONIA in the last 168 hours. Coagulation profile  Recent Labs Lab 01/29/16 0430  INR 1.37    CBC:  Recent Labs Lab 01/28/16 1703 01/30/16 1035  WBC 7.8 9.2  HGB 15.1 13.3  HCT 43.9 39.0  MCV 88.0 87.1  PLT 277 238   Cardiac Enzymes:  Recent Labs Lab 01/28/16 2225 01/29/16 0155  CKTOTAL  --  121  TROPONINI <0.03  --    D-Dimer:  Recent Labs  01/29/16 0430  DDIMER 3.11*   Sepsis Labs:  Recent Labs Lab 01/28/16 1703 01/28/16 2307 01/29/16 0844 01/30/16 1035  WBC 7.8  --   --  9.2  LATICACIDVEN  --  1.46 1.2  --    Urine analysis:    Component Value Date/Time   COLORURINE AMBER* 01/29/2016 0057   APPEARANCEUR CLEAR 01/29/2016 0057   LABSPEC 1.030 01/29/2016 0057   PHURINE 6.0 01/29/2016 0057   GLUCOSEU NEGATIVE 01/29/2016 0057   HGBUR NEGATIVE 01/29/2016 0057   BILIRUBINUR MODERATE* 01/29/2016 0057   KETONESUR 40* 01/29/2016 0057   PROTEINUR 30* 01/29/2016 0057   NITRITE POSITIVE* 01/29/2016 0057   LEUKOCYTESUR TRACE* 01/29/2016 0057   Microbiology Recent Results (from the past 240 hour(s))  Blood Culture (routine x 2)     Status: None (Preliminary result)   Collection Time: 01/28/16 10:40 PM    Result Value Ref Range Status   Specimen Description BLOOD LEFT ANTECUBITAL  Final   Special Requests BOTTLES DRAWN AEROBIC AND ANAEROBIC 5CC   Final   Culture NO GROWTH < 24 HOURS  Final   Report Status PENDING  Incomplete  Blood Culture (routine x 2)     Status: None (Preliminary result)   Collection Time: 01/28/16 10:55 PM  Result Value Ref Range Status   Specimen Description BLOOD RIGHT ANTECUBITAL  Final   Special Requests BOTTLES DRAWN AEROBIC AND ANAEROBIC 10CC   Final   Culture NO GROWTH < 24 HOURS  Final   Report Status PENDING  Incomplete  Urine culture     Status: Abnormal   Collection Time: 01/29/16 12:57 AM  Result Value Ref Range Status   Specimen Description URINE, RANDOM  Final   Special Requests NONE  Final   Culture 2,000 COLONIES/mL INSIGNIFICANT GROWTH (A)  Final   Report Status 01/30/2016 FINAL  Final    Radiology: Ct Angio Chest Pe  W Or Wo Contrast  01/29/2016  CLINICAL DATA:  Shortness of breath for 2 days EXAM: CT ANGIOGRAPHY CHEST WITH CONTRAST TECHNIQUE: Multidetector CT imaging of the chest was performed using the standard protocol during bolus administration of intravenous contrast. Multiplanar CT image reconstructions and MIPs were obtained to evaluate the vascular anatomy. CONTRAST:  100 mL Isovue 370 nonionic COMPARISON:  Chest radiograph January 28, 2016 FINDINGS: Cardiovascular: There is extensive pulmonary embolism arising from the main right pulmonary artery with extension into most of the right-sided pulmonary arterial branches. Thrombus in the right main pulmonary artery expands the vessel. No pulmonary embolus to the left is noted. There is apparent thrombus in the right ventricle as well. The right ventricle to left ventricle diameter ratio is less than 0.9 which is not indicative of right heart strain. There is no appreciable thoracic aortic aneurysm or dissection. The visualized great vessels appear normal. Pericardium is not thickened.  Mediastinum/Nodes: Visualized thyroid appears normal. There is no appreciable thoracic adenopathy. Lungs/Pleura: There is no edema or consolidation. Upper Abdomen: Visualized upper abdominal structures appear unremarkable. Musculoskeletal: There are no blastic or lytic bone lesions. Review of the MIP images confirms the above findings. IMPRESSION: Extensive right-sided pulmonary embolus with pulmonary embolism arising from the right main pulmonary artery extending into multiple branches on the right. There is expansion of the right main pulmonary artery due to a localized thrombus. There is questionable thrombus in the right ventricle as well. Right ventricular to left ventricular diameter is less than 0.9, not meeting criteria for right heart strain. No edema or consolidation. Critical Value/emergent results were called by telephone at the time of interpretation on 01/29/2016 at 8:49 am to Dr. Darnelle Catalan, hospitalist , who verbally acknowledged these results. Electronically Signed   By: Bretta Bang III M.D.   On: 01/29/2016 08:51   Ct Abdomen Pelvis W Contrast  01/29/2016  CLINICAL DATA:  Intermittent abdominal pain for 2 weeks.  Nausea. EXAM: CT ABDOMEN AND PELVIS WITH CONTRAST TECHNIQUE: Multidetector CT imaging of the abdomen and pelvis was performed using the standard protocol following bolus administration of intravenous contrast. CONTRAST:  ISOVUE-300 IOPAMIDOL (ISOVUE-300) INJECTION 61% COMPARISON:  None. FINDINGS: Small pleural effusions, right greater than left, identified. Evidence of previous cardiac surgery with a conduit or stent in the inferior vena cava. No other acute abnormalities seen within the lower chest. No free air or free fluid. There is mild periportal edema which is nonspecific. The liver and portal vein are otherwise normal. The gallbladder is unremarkable. The spleen is prominent measuring 13 cm in cranial caudal dimension. The spleen is otherwise normal. The adrenal glands,  pancreas, and kidneys are normal. The abdominal aorta is normal in appearance. No adenopathy. The stomach and small bowel are unremarkable. The colon and appendix are normal. No other abnormalities in the abdomen. The pelvis demonstrates no adenopathy or mass. The bladder, prostate, and seminal vesicles are normal. Visualized bones are unremarkable. IMPRESSION: 1. Previous cardiac surgery.  Tiny bilateral pleural effusions. 2. Mild periportal edema, nonspecific. 3. No other acute abnormalities. Electronically Signed   By: Gerome Sam III M.D   On: 01/29/2016 00:53   US Abdomen Limited  01/28/2016  CLINICAL DATA:  Right upper quadrant pain EXAM: US ABDOMEN LIMITED - RIGHT UPPER QUADRANT COMPARISON:  None. FINDINGS: Gallbladder: No gallstones or wall thickening visualized. No sonographic Murphy sign noted by sonographer. Common bile duct: Diameter: 2 mm Liver: No focal lesion identified. Within normal limits in parenchymal echogenicity. Antegrade flow in  the imaged portal venous system. IMPRESSION: Negative exam. Electronically Signed   By: Marnee Spring M.D.   On: 01/28/2016 22:55   Dg Abd Acute W/chest  01/28/2016  CLINICAL DATA:  Abdominal pain for 3 months. Intermittent headache for 2 weeks EXAM: DG ABDOMEN ACUTE W/ 1V CHEST COMPARISON:  None. FINDINGS: There is no evidence of dilated bowel loops or free intraperitoneal air. No radiopaque calculi or other significant radiographic abnormality is seen. Heart size and mediastinal contours are within normal limits. Mild bilateral interstitial thickening. No pleural effusion or pneumothorax. IMPRESSION: Negative abdominal radiographs. Mild bilateral interstitial prominence which may reflect mild pulmonary vascular congestion. Electronically Signed   By: Elige Ko   On: 01/28/2016 20:54    Medications:   . sodium chloride flush  3 mL Intravenous Q12H   Continuous Infusions: . sodium chloride 10 mL/hr at 01/29/16 1800  . heparin 1,450 Units/hr  (01/29/16 2159)    Time spent: 25 minutes.   LOS: 1 day   Nishat Livingston  [facility] Pager [phone]. If unable to reach me by pager, please call my cell phone at [phone].  *Please refer to [email or web address], password [email or web address] to get updated schedule on who will round on this patient, as hospitalists switch teams weekly. If 7PM-7AM, please contact night-coverage at [email or web address], password [email or web address] for any overnight needs.  01/30/2016, 4:09 PM

## 2016-01-30 NOTE — Progress Notes (Signed)
Report [facility] at [phone]. No answer, they asked if I can call back in 5 minutes to give report to nurse. Will try again.

## 2016-01-30 NOTE — Progress Notes (Signed)
Report called to [place] at [facility] at [phone]. Chart at desk with disks of scans in chart.

## 2016-01-30 NOTE — Progress Notes (Signed)
  Echocardiogram 2D Echocardiogram has been performed.  Micheal Leonard, Micheal Leonard 01/30/2016, 2:31 PM

## 2016-01-30 NOTE — Progress Notes (Signed)
Patient transported for [facility] via [facility] transport.  Heparin infusing @ 14.125ml/hr.  Patient's father and step-mother at bedside and given [facility].  Handoff [facility] given to [facility].  Patient aware of plan and agreeable to transfer.  No voiced complaints.

## 2016-02-02 LAB — CULTURE, BLOOD (ROUTINE X 2)
CULTURE: NO GROWTH
CULTURE: NO GROWTH

## 2016-10-08 ENCOUNTER — Encounter (HOSPITAL_COMMUNITY): Payer: Self-pay

## 2016-10-08 DIAGNOSIS — E860 Dehydration: Secondary | ICD-10-CM | POA: Insufficient documentation

## 2016-10-08 DIAGNOSIS — Y92002 Bathroom of unspecified non-institutional (private) residence single-family (private) house as the place of occurrence of the external cause: Secondary | ICD-10-CM | POA: Diagnosis not present

## 2016-10-08 DIAGNOSIS — Y999 Unspecified external cause status: Secondary | ICD-10-CM | POA: Insufficient documentation

## 2016-10-08 DIAGNOSIS — Z79899 Other long term (current) drug therapy: Secondary | ICD-10-CM | POA: Diagnosis not present

## 2016-10-08 DIAGNOSIS — Y9389 Activity, other specified: Secondary | ICD-10-CM | POA: Diagnosis not present

## 2016-10-08 DIAGNOSIS — J029 Acute pharyngitis, unspecified: Secondary | ICD-10-CM | POA: Insufficient documentation

## 2016-10-08 DIAGNOSIS — I10 Essential (primary) hypertension: Secondary | ICD-10-CM | POA: Insufficient documentation

## 2016-10-08 DIAGNOSIS — W182XXA Fall in (into) shower or empty bathtub, initial encounter: Secondary | ICD-10-CM | POA: Diagnosis not present

## 2016-10-08 DIAGNOSIS — R55 Syncope and collapse: Secondary | ICD-10-CM | POA: Diagnosis present

## 2016-10-08 LAB — URINALYSIS, ROUTINE W REFLEX MICROSCOPIC
Bilirubin Urine: NEGATIVE
GLUCOSE, UA: NEGATIVE mg/dL
Ketones, ur: NEGATIVE mg/dL
LEUKOCYTES UA: NEGATIVE
NITRITE: NEGATIVE
PH: 5 (ref 5.0–8.0)
Protein, ur: 100 mg/dL — AB
SPECIFIC GRAVITY, URINE: 1.028 (ref 1.005–1.030)

## 2016-10-08 LAB — CBC
HEMATOCRIT: 46.2 % (ref 39.0–52.0)
Hemoglobin: 16 g/dL (ref 13.0–17.0)
MCH: 30.4 pg (ref 26.0–34.0)
MCHC: 34.6 g/dL (ref 30.0–36.0)
MCV: 87.7 fL (ref 78.0–100.0)
PLATELETS: 163 10*3/uL (ref 150–400)
RBC: 5.27 MIL/uL (ref 4.22–5.81)
RDW: 13.5 % (ref 11.5–15.5)
WBC: 7.4 10*3/uL (ref 4.0–10.5)

## 2016-10-08 LAB — RAPID STREP SCREEN (MED CTR MEBANE ONLY): STREPTOCOCCUS, GROUP A SCREEN (DIRECT): NEGATIVE

## 2016-10-08 NOTE — ED Triage Notes (Addendum)
Pt endorses becoming dizzy while in the shower this evening and fell and hit the back of his head. Denies LOC. After patient got up he had 1 episode of vomiting. Oral temp 100.2. Pt endorses having a fever x 3 days with sore throat and several episodes of diarrhea. Neuro exam intact. Pt has small hematoma to back of head. Pt ambulatory. VSS.

## 2016-10-09 ENCOUNTER — Emergency Department (HOSPITAL_COMMUNITY): Payer: Commercial Managed Care - HMO

## 2016-10-09 ENCOUNTER — Emergency Department (HOSPITAL_COMMUNITY)
Admission: EM | Admit: 2016-10-09 | Discharge: 2016-10-09 | Disposition: A | Discharge status: Home / Self Care | Payer: Commercial Managed Care - HMO | Attending: Emergency Medicine | Admitting: Emergency Medicine

## 2016-10-09 DIAGNOSIS — E860 Dehydration: Secondary | ICD-10-CM

## 2016-10-09 DIAGNOSIS — R55 Syncope and collapse: Secondary | ICD-10-CM

## 2016-10-09 DIAGNOSIS — J029 Acute pharyngitis, unspecified: Secondary | ICD-10-CM

## 2016-10-09 LAB — COMPREHENSIVE METABOLIC PANEL
ALBUMIN: 4.1 g/dL (ref 3.5–5.0)
ALT: 28 U/L (ref 17–63)
ANION GAP: 12 (ref 5–15)
AST: 49 U/L — ABNORMAL HIGH (ref 15–41)
Alkaline Phosphatase: 100 U/L (ref 38–126)
BILIRUBIN TOTAL: 0.8 mg/dL (ref 0.3–1.2)
BUN: 15 mg/dL (ref 6–20)
CALCIUM: 8.8 mg/dL — AB (ref 8.9–10.3)
CO2: 25 mmol/L (ref 22–32)
Chloride: 99 mmol/L — ABNORMAL LOW (ref 101–111)
Creatinine, Ser: 1.29 mg/dL — ABNORMAL HIGH (ref 0.61–1.24)
GFR calc non Af Amer: >60 mL/min (ref >60)
GLUCOSE: 120 mg/dL — AB (ref 65–99)
POTASSIUM: 3.8 mmol/L (ref 3.5–5.1)
Sodium: 136 mmol/L (ref 135–145)
TOTAL PROTEIN: 8.4 g/dL — AB (ref 6.5–8.1)

## 2016-10-09 LAB — LIPASE, BLOOD: Lipase: 33 U/L (ref 11–51)

## 2016-10-09 MED ORDER — ACETAMINOPHEN 325 MG PO TABS
650.0000 mg | ORAL_TABLET | Freq: Once | ORAL | Status: AC
  Administered 2016-10-09: 650 mg via ORAL
  Filled 2016-10-09: qty 2

## 2016-10-09 MED ORDER — DEXAMETHASONE SODIUM PHOSPHATE 10 MG/ML IJ SOLN
10.0000 mg | Freq: Once | INTRAMUSCULAR | Status: AC
  Administered 2016-10-09: 10 mg via INTRAVENOUS
  Filled 2016-10-09: qty 1

## 2016-10-09 MED ORDER — SODIUM CHLORIDE 0.9 % IV BOLUS (SEPSIS)
1000.0000 mL | Freq: Once | INTRAVENOUS | Status: AC
Start: 2016-10-09 — End: 2016-10-09
  Administered 2016-10-09: 1000 mL via INTRAVENOUS

## 2016-10-09 MED ORDER — SODIUM CHLORIDE 0.9 % IV BOLUS (SEPSIS)
1000.0000 mL | Freq: Once | INTRAVENOUS | Status: AC
  Administered 2016-10-09: 1000 mL via INTRAVENOUS

## 2016-10-09 NOTE — ED Notes (Signed)
Pt and mother left prior to receiving dc papers. DC instructions reviewed by Dr. Criss AlvineGoldston. Pt ambulatory out of dept with no complaints

## 2016-10-09 NOTE — ED Provider Notes (Signed)
[facility]-EMERGENCY DEPT Provider Note   CSN: [id number] Arrival date & time: 10/08/16  2238     History   Chief Complaint Chief Complaint  Patient presents with  . Fall  . Emesis  . Sore Throat  . Head Injury    HPI Micheal Leonard is a 21 y.o. male.  HPI  21 year old male presents after a fall and hitting his head in the shower. He states that he has had sore throat and diarrhea with fever for the last 3 days. When he was in the shower this evening he started to feel dizzy and then fell backwards, striking the back of his head. Did not lose consciousness. No longer dizzy. He vomited once and felt like he had some blood. He causes a "small amount". No vomiting or nausea since. No headache, blurry vision, or weakness/numbness. No chest pain or shortness of breath. No abdominal pain or urinary symptoms. The only thing bothering him currently is the sore throat. No cough.  Past Medical History:  Diagnosis Date  . Cardiac anomaly, congenital   . Hypertension   . Migraine    "a few/year now; frequency is less than in the past" (01/29/2016)  . Pulmonary embolism (HCC) 01/29/2016  . Tricuspid atresia and stenosis, congenital     Patient Active Problem List   Diagnosis Date Noted  . Pain of upper abdomen   . Abdominal pain 01/29/2016  . SOB (shortness of breath) 01/29/2016  . Headache 01/29/2016  . Tachypnea 01/29/2016  . Pulmonary embolism (HCC) 01/29/2016  . Cardiac anomaly, congenital   . Pyrexia   . Decreased appetite     Past Surgical History:  Procedure Laterality Date  . CARDIAC SURGERY  01/1997; 2000   fontane procedure at [facility] as a child        Home Medications    Prior to Admission medications   Medication Sig Start Date End Date Taking? Authorizing Provider  heparin 100-0.45 UNIT/ML-% infusion Inject 1,450 Units/hr into the vein continuous. Patient not taking: Reported on 10/09/2016 01/30/16   Maryruth Bun Rama, MD    Family History Family History  Problem  Relation Age of Onset  . Hypertension Father     Social History Social History  Substance Use Topics  . Smoking status: Never Smoker  . Smokeless tobacco: Never Used  . Alcohol use No     Allergies   Patient has no known allergies.   Review of Systems Review of Systems  Constitutional: Positive for fever.  HENT: Positive for sore throat.   Respiratory: Negative for cough and shortness of breath.   Gastrointestinal: Positive for vomiting. Negative for abdominal pain.  Musculoskeletal: Negative for back pain and neck pain.  Neurological: Positive for dizziness. Negative for syncope, weakness, numbness and headaches.  All other systems reviewed and are negative.    Physical Exam Updated Vital Signs BP (!) 108/53   Pulse 81   Temp (S) (!) 100.7 F (38.2 C) (Oral)   Resp (!) 24   Ht 6' (1.829 m)   Wt 164 lb (74.4 kg)   SpO2 98%   BMI 22.24 kg/m   Physical Exam  Constitutional: He is oriented to person, place, and time. He appears well-developed and well-nourished.  HENT:  Head: Normocephalic.  Right Ear: External ear normal.  Left Ear: External ear normal.  Nose: Nose normal.  Mouth/Throat: No tonsillar abscesses. Tonsils are 2+ on the right. Tonsils are 2+ on the left. Tonsillar exudate.  Small occipital swelling, no tenderness  Eyes: EOM are normal. Pupils are equal, round, and reactive to light. Right eye exhibits no discharge. Left eye exhibits no discharge.  Neck: Normal range of motion. Neck supple.  Cardiovascular: Normal rate, regular rhythm and normal heart sounds.   Pulmonary/Chest: Effort normal and breath sounds normal.  Abdominal: Soft. There is no tenderness.  Musculoskeletal: He exhibits no edema.  Neurological: He is alert and oriented to person, place, and time.  CN 3-12 grossly intact. 5/5 strength in all 4 extremities. Grossly normal sensation. Normal finger to nose.   Skin: Skin is warm and dry.  Nursing note and vitals reviewed.    ED  Treatments / Results  Labs (all labs ordered are listed, but only abnormal results are displayed) Labs Reviewed  COMPREHENSIVE METABOLIC PANEL - Abnormal; Notable for the following:       Result Value   Chloride 99 (*)    Glucose, Bld 120 (*)    Creatinine, Ser 1.29 (*)    Calcium 8.8 (*)    Total Protein 8.4 (*)    AST 49 (*)    All other components within normal limits  URINALYSIS, ROUTINE W REFLEX MICROSCOPIC - Abnormal; Notable for the following:    Color, Urine AMBER (*)    APPearance HAZY (*)    Hgb urine dipstick SMALL (*)    Protein, ur 100 (*)    Bacteria, UA RARE (*)    Squamous Epithelial / LPF 0-5 (*)    All other components within normal limits  RAPID STREP SCREEN (NOT AT [facility])  CULTURE, GROUP A STREP [facility])  LIPASE, BLOOD  CBC    EKG  EKG Interpretation  Date/Time:  Wednesday October 09 2016 05:13:14 EDT Ventricular Rate:  93 PR Interval:  100 QRS Duration: 124 QT Interval:  388 QTC Calculation: 482 R Axis:   -17 Text Interpretation:  Sinus rhythm with short PR Left ventricular hypertrophy with QRS widening Possible Inferior infarct , age undetermined Abnormal ECG ST/T changes less prominent than when compared to 2017 Confirmed by Malu Pellegrini MD, Tawsha Terrero [phone]) on 10/09/2016 5:48:08 AM       Radiology Dg Chest 2 View  Result Date: 10/09/2016 CLINICAL DATA:  Syncope. EXAM: CHEST  2 VIEW COMPARISON:  Chest radiograph 01/28/2016 FINDINGS: Heart is normal in size. Mediastinal contours are unchanged. Presumed occlusion devices project over the left mediastinum. No pulmonary edema. No focal airspace disease, pleural effusion or pneumothorax. Low lung volumes with mild bronchovascular crowding. No acute osseous abnormality. IMPRESSION: No acute abnormality. Low lung volumes with bronchovascular crowding. Electronically Signed   By: Rubye Oaks M.D.   On: 10/09/2016 05:27    Procedures Procedures (including critical care time)  Medications Ordered in  ED Medications  sodium chloride 0.9 % bolus 1,000 mL (0 mLs Intravenous Stopped 10/09/16 0600)  sodium chloride 0.9 % bolus 1,000 mL (0 mLs Intravenous Stopped 10/09/16 0600)  dexamethasone (DECADRON) injection 10 mg (10 mg Intravenous Given 10/09/16 0452)  acetaminophen (TYLENOL) tablet 650 mg (650 mg Oral Given 10/09/16 0449)     Initial Impression / Assessment and Plan / ED Course  I have reviewed the triage vital signs and the nursing notes.  Pertinent labs & imaging results that were available during my care of the patient were reviewed by me and considered in my medical decision making (see chart for details).     Patient's near syncope was likely related to dehydration given decreased oral intake and diarrhea over the last couple days. Appears to have a  viral pharyngitis. Given tonsillar hypertrophy will be given Decadron. Given IV fluids. Currently feels much better. 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 At this point I doubt this was a cardiac event but given this history I have recommended he follow-up closely with his cardiologist. Take plenty of fluids. Strict return per cautions. He has been waiting room for several hours and we are now over 6 hours since his injury. I think his head injury as a mild concussion. Given no current headache or vomiting I do not think a head CT would be very helpful and is unlikely to be positive. Low suspicion for head injury. Strict return precautions.  Final Clinical Impressions(s) / ED Diagnoses   Final diagnoses:  Viral pharyngitis  Near syncope  Dehydration    New Prescriptions Discharge Medication List as of 10/09/2016  6:48 AM       Pricilla LovelessScott Afsana Liera, MD 10/09/16 [phone]

## 2016-10-11 LAB — CULTURE, GROUP A STREP (THRC)

## 2016-10-22 ENCOUNTER — Encounter (HOSPITAL_COMMUNITY): Payer: Self-pay

## 2016-10-22 ENCOUNTER — Emergency Department (HOSPITAL_COMMUNITY): Payer: Commercial Managed Care - HMO

## 2016-10-22 DIAGNOSIS — R809 Proteinuria, unspecified: Secondary | ICD-10-CM | POA: Diagnosis not present

## 2016-10-22 DIAGNOSIS — G3184 Mild cognitive impairment, so stated: Secondary | ICD-10-CM | POA: Diagnosis not present

## 2016-10-22 DIAGNOSIS — Z8774 Personal history of (corrected) congenital malformations of heart and circulatory system: Secondary | ICD-10-CM | POA: Diagnosis not present

## 2016-10-22 DIAGNOSIS — J039 Acute tonsillitis, unspecified: Secondary | ICD-10-CM | POA: Diagnosis not present

## 2016-10-22 DIAGNOSIS — I10 Essential (primary) hypertension: Secondary | ICD-10-CM | POA: Insufficient documentation

## 2016-10-22 DIAGNOSIS — Z21 Asymptomatic human immunodeficiency virus [HIV] infection status: Principal | ICD-10-CM | POA: Insufficient documentation

## 2016-10-22 DIAGNOSIS — F509 Eating disorder, unspecified: Secondary | ICD-10-CM | POA: Diagnosis present

## 2016-10-22 DIAGNOSIS — Z86711 Personal history of pulmonary embolism: Secondary | ICD-10-CM | POA: Diagnosis not present

## 2016-10-22 DIAGNOSIS — E860 Dehydration: Secondary | ICD-10-CM | POA: Insufficient documentation

## 2016-10-22 LAB — BASIC METABOLIC PANEL
Anion gap: 11 (ref 5–15)
BUN: 8 mg/dL (ref 6–20)
CHLORIDE: 99 mmol/L — AB (ref 101–111)
CO2: 25 mmol/L (ref 22–32)
CREATININE: 0.94 mg/dL (ref 0.61–1.24)
Calcium: 9.4 mg/dL (ref 8.9–10.3)
GFR calc Af Amer: >60 mL/min (ref >60)
GFR calc non Af Amer: >60 mL/min (ref >60)
Glucose, Bld: 143 mg/dL — ABNORMAL HIGH (ref 65–99)
Potassium: 3.7 mmol/L (ref 3.5–5.1)
SODIUM: 135 mmol/L (ref 135–145)

## 2016-10-22 LAB — CBC
HEMATOCRIT: 46.2 % (ref 39.0–52.0)
Hemoglobin: 16.4 g/dL (ref 13.0–17.0)
MCH: 31.2 pg (ref 26.0–34.0)
MCHC: 35.5 g/dL (ref 30.0–36.0)
MCV: 87.8 fL (ref 78.0–100.0)
PLATELETS: 239 10*3/uL (ref 150–400)
RBC: 5.26 MIL/uL (ref 4.22–5.81)
RDW: 13.9 % (ref 11.5–15.5)
WBC: 5.2 10*3/uL (ref 4.0–10.5)

## 2016-10-22 LAB — I-STAT TROPONIN, ED: Troponin i, poc: 0 ng/mL (ref 0.00–0.08)

## 2016-10-22 NOTE — ED Triage Notes (Signed)
Pt brought in by mother; Pt was in triage room a lone at first; pt was not answering RN questions; pt appears to have some mental deficits; Pt presents with purposeful shallow breathing; Mother states pt has no mental deficits but pt is not really responding to RN questions; mother reports a lot of medical problems. Pt has not had normal eating pattern x 3 weeks; pt reports spitting up blood; pt appears to be withdrawn and may need psy evaluation; Mother can not provide any information on why pt is acting as if he has mental disorder; pt has been sitting in bed not doing anything for weeks per mother;

## 2016-10-23 ENCOUNTER — Emergency Department (HOSPITAL_COMMUNITY): Payer: Commercial Managed Care - HMO

## 2016-10-23 ENCOUNTER — Encounter (HOSPITAL_COMMUNITY): Payer: Self-pay | Admitting: General Practice

## 2016-10-23 ENCOUNTER — Observation Stay (HOSPITAL_COMMUNITY)
Admission: EM | Admit: 2016-10-23 | Discharge: 2016-10-25 | Disposition: A | Discharge status: Home / Self Care | Payer: Commercial Managed Care - HMO | Attending: Oncology | Admitting: Oncology

## 2016-10-23 DIAGNOSIS — E860 Dehydration: Secondary | ICD-10-CM | POA: Diagnosis not present

## 2016-10-23 DIAGNOSIS — A564 Chlamydial infection of pharynx: Secondary | ICD-10-CM | POA: Diagnosis present

## 2016-10-23 DIAGNOSIS — Z8679 Personal history of other diseases of the circulatory system: Secondary | ICD-10-CM | POA: Diagnosis not present

## 2016-10-23 DIAGNOSIS — R809 Proteinuria, unspecified: Secondary | ICD-10-CM | POA: Diagnosis not present

## 2016-10-23 DIAGNOSIS — Z21 Asymptomatic human immunodeficiency virus [HIV] infection status: Secondary | ICD-10-CM | POA: Diagnosis present

## 2016-10-23 DIAGNOSIS — Z9889 Other specified postprocedural states: Secondary | ICD-10-CM

## 2016-10-23 DIAGNOSIS — R1310 Dysphagia, unspecified: Secondary | ICD-10-CM

## 2016-10-23 DIAGNOSIS — Z8249 Family history of ischemic heart disease and other diseases of the circulatory system: Secondary | ICD-10-CM | POA: Diagnosis not present

## 2016-10-23 DIAGNOSIS — R042 Hemoptysis: Secondary | ICD-10-CM

## 2016-10-23 DIAGNOSIS — Z8619 Personal history of other infectious and parasitic diseases: Secondary | ICD-10-CM

## 2016-10-23 DIAGNOSIS — J039 Acute tonsillitis, unspecified: Secondary | ICD-10-CM | POA: Diagnosis not present

## 2016-10-23 DIAGNOSIS — I2699 Other pulmonary embolism without acute cor pulmonale: Secondary | ICD-10-CM | POA: Diagnosis present

## 2016-10-23 DIAGNOSIS — Q224 Congenital tricuspid stenosis: Secondary | ICD-10-CM

## 2016-10-23 DIAGNOSIS — J029 Acute pharyngitis, unspecified: Secondary | ICD-10-CM

## 2016-10-23 DIAGNOSIS — Q249 Congenital malformation of heart, unspecified: Secondary | ICD-10-CM

## 2016-10-23 DIAGNOSIS — Z8774 Personal history of (corrected) congenital malformations of heart and circulatory system: Secondary | ICD-10-CM

## 2016-10-23 DIAGNOSIS — B20 Human immunodeficiency virus [HIV] disease: Secondary | ICD-10-CM | POA: Diagnosis present

## 2016-10-23 DIAGNOSIS — F819 Developmental disorder of scholastic skills, unspecified: Secondary | ICD-10-CM

## 2016-10-23 HISTORY — DX: Hemoptysis: R04.2

## 2016-10-23 HISTORY — DX: Acute pharyngitis, unspecified: J02.9

## 2016-10-23 HISTORY — DX: Infectious mononucleosis, unspecified without complication: B27.90

## 2016-10-23 HISTORY — DX: Personal history of (corrected) congenital malformations of heart and circulatory system: Z87.74

## 2016-10-23 HISTORY — DX: Other specified postprocedural states: Z98.890

## 2016-10-23 LAB — HEPATIC FUNCTION PANEL
ALBUMIN: 3.2 g/dL — AB (ref 3.5–5.0)
ALK PHOS: 122 U/L (ref 38–126)
ALT: 25 U/L (ref 17–63)
AST: 33 U/L (ref 15–41)
BILIRUBIN TOTAL: 1 mg/dL (ref 0.3–1.2)
Bilirubin, Direct: 0.5 mg/dL (ref 0.1–0.5)
Indirect Bilirubin: 0.5 mg/dL (ref 0.3–0.9)
Total Protein: 8.5 g/dL — ABNORMAL HIGH (ref 6.5–8.1)

## 2016-10-23 LAB — PROTEIN / CREATININE RATIO, URINE
CREATININE, URINE: 233.03 mg/dL
PROTEIN CREATININE RATIO: 0.31 mg/mg{creat} — AB (ref 0.00–0.15)
TOTAL PROTEIN, URINE: 73 mg/dL

## 2016-10-23 LAB — RAPID STREP SCREEN (MED CTR MEBANE ONLY): STREPTOCOCCUS, GROUP A SCREEN (DIRECT): NEGATIVE

## 2016-10-23 LAB — PROTIME-INR
INR: 1.26
Prothrombin Time: 15.8 seconds — ABNORMAL HIGH (ref 11.4–15.2)

## 2016-10-23 LAB — URINALYSIS, ROUTINE W REFLEX MICROSCOPIC
BACTERIA UA: NONE SEEN
BILIRUBIN URINE: NEGATIVE
Glucose, UA: NEGATIVE mg/dL
HGB URINE DIPSTICK: NEGATIVE
Ketones, ur: 5 mg/dL — AB
Leukocytes, UA: NEGATIVE
Nitrite: NEGATIVE
Protein, ur: 30 mg/dL — AB
SQUAMOUS EPITHELIAL / LPF: NONE SEEN
pH: 5 (ref 5.0–8.0)

## 2016-10-23 LAB — RAPID URINE DRUG SCREEN, HOSP PERFORMED
AMPHETAMINES: NOT DETECTED
Barbiturates: NOT DETECTED
Benzodiazepines: NOT DETECTED
COCAINE: NOT DETECTED
OPIATES: NOT DETECTED
TETRAHYDROCANNABINOL: NOT DETECTED

## 2016-10-23 LAB — D-DIMER, QUANTITATIVE: D-Dimer, Quant: 0.81 ug/mL-FEU — ABNORMAL HIGH (ref 0.00–0.50)

## 2016-10-23 LAB — ETHANOL: Alcohol, Ethyl (B): <5 mg/dL (ref <5)

## 2016-10-23 LAB — ACETAMINOPHEN LEVEL: Acetaminophen (Tylenol), Serum: <10 ug/mL — ABNORMAL LOW (ref 10–30)

## 2016-10-23 LAB — SALICYLATE LEVEL: Salicylate Lvl: <7 mg/dL (ref 2.8–30.0)

## 2016-10-23 MED ORDER — IOPAMIDOL (ISOVUE-370) INJECTION 76%
INTRAVENOUS | Status: AC
  Administered 2016-10-23: 100 mL
  Filled 2016-10-23: qty 100

## 2016-10-23 MED ORDER — MAGIC MOUTHWASH W/LIDOCAINE
5.0000 mL | Freq: Three times a day (TID) | ORAL | Status: DC | PRN
  Administered 2016-10-23: 5 mL via ORAL
  Filled 2016-10-23: qty 5

## 2016-10-23 MED ORDER — SODIUM CHLORIDE 0.9 % IV BOLUS (SEPSIS)
1000.0000 mL | Freq: Once | INTRAVENOUS | Status: AC
Start: 2016-10-23 — End: 2016-10-23
  Administered 2016-10-23: 1000 mL via INTRAVENOUS

## 2016-10-23 MED ORDER — KETOROLAC TROMETHAMINE 30 MG/ML IJ SOLN
30.0000 mg | Freq: Four times a day (QID) | INTRAMUSCULAR | Status: DC | PRN
  Administered 2016-10-23 (×2): 30 mg via INTRAVENOUS
  Filled 2016-10-23 (×2): qty 1

## 2016-10-23 MED ORDER — SODIUM CHLORIDE 0.9 % IV SOLN
INTRAVENOUS | Status: AC
  Administered 2016-10-23 – 2016-10-24 (×2): via INTRAVENOUS

## 2016-10-23 MED ORDER — ENOXAPARIN SODIUM 80 MG/0.8ML ~~LOC~~ SOLN
75.0000 mg | Freq: Once | SUBCUTANEOUS | Status: DC
Start: 2016-10-23 — End: 2016-10-23
  Administered 2016-10-23: 75 mg via SUBCUTANEOUS
  Filled 2016-10-23: qty 0.8

## 2016-10-23 MED ORDER — ASPIRIN EC 81 MG PO TBEC
81.0000 mg | DELAYED_RELEASE_TABLET | Freq: Every day | ORAL | Status: DC
  Administered 2016-10-24 – 2016-10-25 (×2): 81 mg via ORAL
  Filled 2016-10-23 (×2): qty 1

## 2016-10-23 MED ORDER — ENSURE ENLIVE PO LIQD
237.0000 mL | Freq: Two times a day (BID) | ORAL | Status: DC
Start: 2016-10-24 — End: 2016-10-25
  Administered 2016-10-24 – 2016-10-25 (×3): 237 mL via ORAL

## 2016-10-23 MED ORDER — ENOXAPARIN SODIUM 40 MG/0.4ML ~~LOC~~ SOLN
40.0000 mg | SUBCUTANEOUS | Status: DC
  Administered 2016-10-24: 40 mg via SUBCUTANEOUS
  Filled 2016-10-23: qty 0.4

## 2016-10-23 MED ORDER — SODIUM CHLORIDE 0.9 % IV BOLUS (SEPSIS)
500.0000 mL | Freq: Once | INTRAVENOUS | Status: AC
  Administered 2016-10-23: 500 mL via INTRAVENOUS

## 2016-10-23 MED ORDER — SODIUM CHLORIDE 0.9% FLUSH: 3.0000 mL | Freq: Two times a day (BID) | INTRAVENOUS | Status: DC

## 2016-10-23 NOTE — ED Notes (Signed)
Patient provided orange juice per request after PA Upstill advised pt okay to drink.

## 2016-10-23 NOTE — ED Provider Notes (Signed)
[facility]-EMER[facility]ENCY DEPT Provider Note   CSN: [id number] Arrival date & time: 10/22/16  2258     History   Chief Complaint Chief Complaint  Patient presents with  . Eating Disorder  . Hemoptysis  . Chest Pain    HPI Micheal Leonard is a 21 y.o. male.  Patient here with mother who is concerned about symptoms of decreased appetite, cough with hemoptysis and sore throat. Mother not available at the time of my HPI. The patient confirms chief complaints and that symptoms of cough and sore throat started 1-2 days ago. Symptom of decreased appetite started 3 weeks ago. The patient does not offer details and answers "I don't know" to most questions.    The history is provided by the patient. No language interpreter was used.  Chest Pain   Associated symptoms include cough. Pertinent negatives include no abdominal pain, no fever and no vomiting.    Past Medical History:  Diagnosis Date  . Cardiac anomaly, congenital   . Hypertension   . Migraine    "a few/year now; frequency is less than in the past" (01/29/2016)  . Pulmonary embolism (HCC) 01/29/2016  . Tricuspid atresia and stenosis, congenital     Patient Active Problem List   Diagnosis Date Noted  . Pain of upper abdomen   . Abdominal pain 01/29/2016  . SOB (shortness of breath) 01/29/2016  . Headache 01/29/2016  . Tachypnea 01/29/2016  . Pulmonary embolism (HCC) 01/29/2016  . Cardiac anomaly, congenital   . Pyrexia   . Decreased appetite     Past Surgical History:  Procedure Laterality Date  . CARDIAC SURGERY  01/1997; 2000   fontane procedure at [facility] as a child        Home Medications    Prior to Admission medications   Medication Sig Start Date End Date Taking? Authorizing Provider  heparin 100-0.45 UNIT/ML-% infusion Inject 1,450 Units/hr into the vein continuous. Patient not taking: Reported on 10/09/2016 01/30/16   Maryruth Bun Rama, MD    Family History Family History  Problem Relation Age of Onset  .  Hypertension Father     Social History Social History  Substance Use Topics  . Smoking status: Never Smoker  . Smokeless tobacco: Never Used  . Alcohol use No     Allergies   Patient has no known allergies.   Review of Systems Review of Systems  Constitutional: Negative for chills and fever.  HENT: Positive for sore throat. Negative for trouble swallowing.   Respiratory: Positive for cough and chest tightness.   Cardiovascular: Positive for chest pain.  Gastrointestinal: Negative.  Negative for abdominal pain and vomiting.  Musculoskeletal: Negative.   Skin: Negative.   Neurological: Negative.      Physical Exam Updated Vital Signs BP 112/77   Pulse (!) 102   Temp 99 F (37.2 C) (Oral)   Resp (!) 29   SpO2 97%   Physical Exam  Constitutional: He is oriented to person, place, and time. He appears well-developed and well-nourished.  HENT:  Head: Normocephalic.  Mouth/Throat: Oropharynx is clear and moist.  Neck: Normal range of motion. Neck supple.  Cardiovascular: Normal rate and regular rhythm.   Pulmonary/Chest: Effort normal and breath sounds normal. He has no wheezes. He has no rales.  Abdominal: Soft. Bowel sounds are normal. There is no tenderness. There is no rebound and no guarding.  Musculoskeletal: Normal range of motion.  Neurological: He is alert and oriented to person, place, and time.  Skin: Skin is  warm and dry. No rash noted.  Psychiatric: His affect is blunt. His speech is delayed. He is slowed. He is not actively hallucinating. He expresses no homicidal and no suicidal ideation.     ED Treatments / Results  Labs (all labs ordered are listed, but only abnormal results are displayed) Labs Reviewed  BASIC METABOLIC PANEL - Abnormal; Notable for the following:       Result Value   Chloride 99 (*)    Glucose, Bld 143 (*)    All other components within normal limits  PROTIME-INR - Abnormal; Notable for the following:    Prothrombin Time 15.8  (*)    All other components within normal limits  CBC  SALICYLATE LEVEL  ACETAMINOPHEN LEVEL  ETHANOL  RAPID URINE DRUG SCREEN, HOSP PERFORMED  I-STAT TROPOININ, ED    EKG  EKG Interpretation  Date/Time:  Tuesday October 22 2016 23:18:26 EDT Ventricular Rate:  123 PR Interval:  172 QRS Duration: 120 QT Interval:  318 QTC Calculation: 455 R Axis:   -35 Text Interpretation:  Sinus tachycardia Left axis deviation Left ventricular hypertrophy with QRS widening and repolarization abnormality Inferior infarct , age undetermined Anteroseptal infarct , age undetermined Abnormal ECG Confirmed by HORTON  MD, COURTNEY ([id number]) on 10/23/2016 3:43:09 AM       Radiology Dg Chest 2 View  Result Date: 10/22/2016 CLINICAL DATA:  Upper chest pain and cough with fever x3 weeks EXAM: CHEST  2 VIEW COMPARISON:  10/09/2016 CXR FINDINGS: Cardiac and mediastinal closure devices are again seen. No pneumonic consolidation, effusion or pneumothorax. Heart size is within normal limits. No aneurysmal dilatation of the thoracic aorta. No acute osseous abnormality. IMPRESSION: No active cardiopulmonary disease. Cardiac and mediastinal closure devices are again noted. Electronically Signed   By: Tollie Eth M.D.   On: 10/22/2016 23:44   Ct Angio Chest Pe W/cm &/or Wo Cm  Result Date: 10/23/2016 CLINICAL DATA:  History of tricuspid atresia and Fontan procedure. Hemoptysis. EXAM: CT ANGIOGRAPHY CHEST WITH CONTRAST TECHNIQUE: Multidetector CT imaging of the chest was performed using the standard protocol during bolus administration of intravenous contrast. Multiplanar CT image reconstructions and MIPs were obtained to evaluate the vascular anatomy. CONTRAST:  100 mL Isovue 370 IV COMPARISON:  CTA chest 01/29/2016. 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 There is heterogeneous contrast opacification  within this connection, likely secondary to mixing. 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 There is limited contrast opacification within this conduit, unchanged compared to the prior study. This is likely due to venous return of non-contrast-opacified blood from the lower systemic circulation, rather than thrombus. There is a large filling defect within the right pulmonary artery, relatively unchanged from the prior study, and likely mixing artifact caused by non-opacified systemic venous return blood mixing with the contrast enhanced pulmonary arterial blood. There is attenuated opacification of the right lower and middle lobar and segmental arteries. These findings are similar to the prior study, although there is better opacification of the right upper lobe segmental arteries on the current examination. There is no evidence of right heart strain, though the RV:LV ratio is meaningless in this patient. There is no pulmonary arteriovenous malformation. Mediastinum/Nodes: No enlarged mediastinal, hilar, or axillary lymph nodes. Thyroid gland, trachea, and esophagus demonstrate no significant findings. Lungs/Pleura: Lungs are clear. No pleural effusion or pneumothorax.  Upper Abdomen: No acute abnormality. Musculoskeletal: No chest wall abnormality. No acute or significant osseous findings. Review of the MIP images confirms the above findings. IMPRESSION: 1. 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 2. Heterogeneously diminished opacification of the right pulmonary artery and right middle and lower lobar and segmental arteries is favored to be due to mixing artifact as non-opacified blood from the systemic venous return mixes with the contrast-enhanced blood in the pulmonary  artery. Some degree of superimposed thrombus, however, would be difficult to exclude. Echocardiography would probably be helpful in this regard. 3. No pulmonary arteriovenous malformation or other finding to explain the reported hemoptysis. 4. Clear lungs. Electronically Signed   By: Deatra Robinson M.D.   On: 10/23/2016 05:07    Procedures Procedures (including critical care time)  Medications Ordered in ED Medications  sodium chloride 0.9 % bolus 500 mL (0 mLs Intravenous Stopped 10/23/16 0619)  iopamidol (ISOVUE-370) 76 % injection (100 mLs  Contrast Given 10/23/16 0422)     Initial Impression / Assessment and Plan / ED Course  I have reviewed the triage vital signs and the nursing notes.  Pertinent labs & imaging results that were available during my care of the patient were reviewed by me and considered in my medical decision making (see chart for details).     Patient presents with c/o sore throat, coughing up blood, SOB. Per mom per triage notes (mother not in the room with patient at any time) he has not been eating for the past 3 weeks and has been "in the bed" which is not normal behavior for him. The patient does not add to this history.   Chart reviewed. 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 He states he is no longer anticoagulated but when asked why he was not taking anticoagulation he states he cannot remember.   Patient has removed his IV "because it was hurting." CT PE done and is inconclusive. Mother is now in the room and confirms information given in triage and by patient. He continues to have a flat affect but denies SI/HI/AVH. Mother denies any psychiatric history.   Discussed with attending. The patient will require admission given history of PE and current inconclusive study, in the setting of hemoptysis, tachycardia on presentation. Unassigned admitting paged. May consider psychiatric consultation as well.  Final Clinical Impressions(s) / ED  Diagnoses   Final diagnoses:  None   1. Hemoptysis  New Prescriptions New Prescriptions   No medications on file     Elpidio Anis, Cordelia Poche 10/23/16 8119    Shon Baton, MD 10/24/16 [phone]

## 2016-10-23 NOTE — ED Notes (Signed)
Report attempted, busy signal.

## 2016-10-23 NOTE — ED Notes (Signed)
Mom phone number for updates - [phone]

## 2016-10-23 NOTE — ED Notes (Signed)
Pt verbally responsive to some questions, endorses chest and throat pain.

## 2016-10-23 NOTE — ED Notes (Signed)
Admitting MDs at bedside.

## 2016-10-23 NOTE — ED Notes (Signed)
ED Provider at bedside. 

## 2016-10-23 NOTE — H&P (Signed)
Date: 10/23/2016               Patient Name:  Kamon Fahr MRN: [id number]  DOB: January 23, 1996 Age / Sex: 21 y.o.,  male   PCP: No Pcp Per Patient         Medical Service: Internal Medicine Teaching Service         Attending Physician: Dr. Levert Feinstein, MD    First Contact: Dr. Antony Contras Pager: [phone]  Second Contact: Dr. Allena Katz Pager: [phone]       After Hours (After 5p/  First Contact Pager: [phone]  weekends / holidays): Second Contact Pager: [phone]   Chief Complaint: sore throat  History of Present Illness: Mr. 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   Patient and mother state that 3 weeks ago patient developed sore throat and fatigue. Patient was seen in the emergency department on 10/09/2016 with sore throat. He also complained of a presyncopal episode leading to a fall due to lightheadedness. Patient's symptoms were attributed to a near syncopal episode secondary to dehydration given decreased oral intake from febrile pharyngitis and a possible mild concussion. He was given IV fluids and discharged from the emergency department. Since that time, patient has continued to have a sore throat with odynophagia, weakness and fatigue. His mother states that he barely eats or drinks due to the pain and mostly stays in bed all day. Prior to this, his mother states that he was a normal 33 year old boy who loves to eat and hangout with his friends. She does mention that overall he has been unmotivated after graduating high school and does not desire to work. Patient admits to spitting blood intermittently over the past 3 weeks. He denies hemoptysis or hematemesis. He has also had intermittent shortness of breath that can occur at rest or with exertion. He admits to one episode of left-sided chest pain that  radiated into his neck yesterday that lasted for several hours and resolved on its own. He was unable to characterize the pain or severity. He continues to have fatigue and weakness. He denies any current shortness of breath, chest pain or lightheadedness. He does feel dehydrated. He denies nausea, vomiting, abdominal pain, diarrhea, constipation, dysuria. His weight has remained stable.  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 Patient was lost to follow up for 10 years. In 2013, Cardiology recommended daily ASA to prevent Fontan thrombus. Patient should be seen annually by a Cardiologist. In 2015, cardiac MRI was recommended to visualize anatomy and Glenn/Fontan circuit which showed Tricuspid atresia with normally related great arteries s/p Fontan with widely patent SVC, IVC, and Fontan connections with no pulmonary artery stenosis. Normal MV and low-normal LV EF. 24 hour holter monitor in 2015 were normal. Notes from 2017 indicate patient was NOT compliant with daily ASA. In July 2017, patient presented to [facility] on 01/28/16 with complaint of abdominal pain and shortness of breath. Patient was not taking his home aspirin. Patient was noted to have an elevated d-dimer at 3.11 and CT scan which demonstrated concern for an extensive right-sided pulmonary embolus arising from the right main pulmonary artery extending into multiple branches on the right. Patient was transferred to [facility] for  ongoing management of the pulmonary embolus. At [facility], patient underwent cardiac catheterization which ruled out pulmonary embolism and thrombosis in Fontan. Pediatric nephrology was consulted during admission given Tea-colored urinalysis with proteinuria and elevated inflammatory markers. C3/C4 complement were normal and there is no evidence to suggest active immune complex  glomerulonephritis. Also during admission, patient was noted to have enlarged mesorectal, IMA, and inguinal lymph nodes. EGD/colonoscopy was overall normal but did show evidence of erythematous mucosa 5 cm proximal to the anus with evidence of prolapse. EBV serologies were positive. Patient was discharged and continued on supportive measures for EBV. He was given MiraLAX and steroid suppositories for prolapse. He was scheduled for follow-up with nephrology to monitor proteinuria. He was discharged on aspirin 81 mg daily for pulmonary embolus prophylaxis. Patient did follow up with nephrology as outpatient. He continued to have mild proteinuria and no hematuria which was attributed to his congenital heart disease. He was not felt to have glomerulonephritis, but asked to follow up in 3 months. Repeat urine protein/creatinine ratio was normal.  In the ED, patient was found to be afebrile, normotensive, tachycardic to 133, respiratory rate 22 and satting 92-100% on room air. CBC and basic metabolic panel were within normal limits. Chest x-ray showed no acute cardiopulmonary disease. Troponin negative. EKG showed sinus tachycardia with chronic T-wave inversions in II, III, aVF, V3, V4, V5 and V6. UDS, alcohol level, salicylate level, Tylenol level all negative. CT angiogram chest showed mixing artifact in the right pulmonary artery and right middle lobe and lower lobe arteries at could not rule out a superimposed thrombus. There was stable bidirectional Glenn connection of SVC and right pulmonary artery with a Fontan conduit between right pulmonary artery and IVC.  The ED provider was concerned about a recurrent pulmonary embolus given noncompliance of aspirin. Patient received Lovenox in the emergency department.  I reviewed the CT angiogram chest with the radiologist from 2017 and from this admission. Neither imaging studies showed evidence of pulmonary embolism, rather a mixing defect due to patient's altered  anatomy.   Meds: No current facility-administered medications for this encounter.    No current outpatient prescriptions on file.    Allergies: Allergies as of 10/22/2016  . (No Known Allergies)   Past Medical History:  Diagnosis Date  . Cardiac anomaly, congenital   . Hypertension   . Migraine    "a few/year now; frequency is less than in the past" (01/29/2016)  . Pulmonary embolism (HCC) 01/29/2016  . Tricuspid atresia and stenosis, congenital     Family History:  Family History  Problem Relation Age of Onset  . Hypertension Father     Social History:  Tobacco ZOX:WRUEAV Alcohol Use: Denies Illicit Drug Use: Denies Lives with mother in [place]. Graduated high school. Does not work.  Review of Systems: A complete ROS was reviewed and negative except as per HPI.   Physical Exam: Blood pressure (!) 106/58, pulse 90, temperature 99 F (37.2 C), temperature source Oral, resp. rate 20, SpO2 97 %. General: Vital signs reviewed.  Patient is in no acute distress and cooperative with exam.  Eyes: PERRL, conjunctivae normal, no scleral icterus.  Ears, Nose, Throat, and Mouth: Normal bilateral tympanic membranes. Erythematous, edematous bilateral nasal turbinates. Evidence of gingivitis with bleeding gums. Halitosis. Enlarged bilateral tonsils, right greater than left. Erythematous posterior oropharynx. Dry mucous membranes and dry lips. Poor dentition. No evidence of thrush. Cardiovascular: RRR,  no murmurs, gallops, or rubs. No JVD or carotid bruit present. No lower extremity  edema bilaterally. Bilateral radial and pedal pulses are intact and symmetric bilaterally.  Pulmonary: Clear to auscultation bilaterally, no wheezes, rales, or rhonchi. No accessory muscle use. Gastrointestinal: Soft, non-tender, non-distended, BS +, no masses, organomegaly, or guarding present.  Neurologic: Awake, alert. Moving all extremities equally Skin: Warm, dry and intact. No rashes or  erythema. Psychiatric: Flat mood and affect. speech and behavior is not consistent with age and education level. Acts Younger than stated age. Cognition and memory are normal.   EKG: Sinus tachycardia with chronic T-wave inversions in II, III, aVF, V3, V4, V5 and V6.  CXR: I have personally reviewed these images. No acute cardiopulmonary disease.  CT Angio Chest PE:  1. 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 2. Heterogeneously diminished opacification of the right pulmonary artery and right middle and lower lobar and segmental arteries is favored to be due to mixing artifact as non-opacified blood from the systemic venous return mixes with the contrast-enhanced blood in the pulmonary artery. Some degree of superimposed thrombus, however, would be difficult to exclude. Echocardiography would probably be helpful in this regard. 3. No pulmonary arteriovenous malformation or other finding to explain the reported hemoptysis.  Assessment & Plan by Problem: Principal Problem:   Dehydration Active Problems:   Pharyngitis   S/P Fontan procedure   S/P bidirectional Glenn shunt   Tricuspid atresia with normal great arteries   Proteinuria  Mr. 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   Dehydration: Patient presents to the emergency department after a 3 week history of sore throat, odynophagia, and decreased appetite. Clinically, patient appears dehydrated with dry mucous membranes, tachycardia and dark urine. His dehydration and poor by mouth intake is secondary to sore throat and odynophagia. There is no associated abdominal pain, nausea, vomiting or diarrhea. We  will admit patient for IV fluids and further workup for underlying etiology. I do not feel that clinically patient has a pulmonary embolism. He has no prior history of pulmonary embolism. The prior CT angiogram chest was incorrect, which I reviewed with the radiologist today. Repeat CT angiogram chest today also did not show a pulmonary embolism. He is satting well on room air and denies chest pain. His Wells PE score is 1.5 making him low risk. Patient's "spitting out blood" is likely secondary to gingivitis given his actively bleeding gums on examination. A d-dimer, however is elevated at 0.8, but significantly lower than d-dimer 1 year ago which was 3.11. -Admit to inpatient -IVF -Liquid diet -Further workup for underlying etiology as below  Pharyngitis with Odynophagia: Patient reports a 3 week history of sore throat and odynophagia. This is severely limited his oral intake. On exam, patient does have evidence of posterior oropharynx erythema and enlarged tonsils. No obvious evidence of thrush to explain odynophagia. We will proceed with viral workup for this ongoing pharyngitis. We may want to consider imaging of his head and neck with CT scan or involvement of ENT versus gastroenterology for better visualization of his pharynx and esophagus. -HIV, EBV, CMV, parvovirus, RSV -NSAIDs -Magic mouthwash  Tricuspid Atresia s/p [name] and Fontan Surgery: Patient has a significant past medical history of tricuspid atresia with normal great arteries, small VSD s/p initial Glenn operation  and subsequent Fontan surgery on 09/11/2000. Patient was lost to follow up for 10 years. In 2013, Cardiology recommended daily ASA to prevent Fontan thrombus. Patient should be seen annually by a Cardiologist. In 2015, cardiac MRI was recommended to visualize anatomy and Glenn/Fontan circuit which showed Tricuspid atresia with normally related great arteries s/p Fontan with widely patent SVC, IVC, and Fontan connections with no  pulmonary artery stenosis. Normal MV and low-normal LV EF. 24 hour holter monitor in 2015 were normal. Notes from 2017 indicate patient was NOT compliant with daily ASA. Cardiac catheterization in 2017 ruled out pulmonary embolism and thrombosis in Fontan.  -Obtain echocardiogram -Continue aspirin 81 mg daily  Proteinuria: Pediatric nephrology was consulted during 2017 admission given Tea-colored urinalysis with proteinuria and elevated inflammatory markers. C3/C4 complement were normal and there is no evidence to suggest active immune complex glomerulonephritis. Patient did follow up with nephrology as outpatient. He continued to have mild proteinuria and no hematuria which was attributed to his congenital heart disease. He was not felt to have glomerulonephritis, but asked to follow up in 3 months. Repeat urine protein/creatinine ratio was normal. Patient's UA this admission is amber colored with presence of mucus and protein. -protein/creatinine ratio  DVT/PE ppx: Lovenox SQ QD FEN: Clear Liquid, IVF CODE: FULL Dispo: Admit patient to Inpatient with expected length of stay greater than 2 midnights.  Signed: Karlene Lineman, DO PGY-3 Internal Medicine Resident Pager # [phone] 10/23/2016 11:17 AM

## 2016-10-23 NOTE — ED Notes (Signed)
Patient transported to CT 

## 2016-10-23 NOTE — ED Notes (Signed)
Report attempted 

## 2016-10-24 ENCOUNTER — Observation Stay (HOSPITAL_COMMUNITY): Payer: Commercial Managed Care - HMO

## 2016-10-24 ENCOUNTER — Encounter (HOSPITAL_COMMUNITY): Payer: Self-pay | Admitting: Radiology

## 2016-10-24 DIAGNOSIS — J029 Acute pharyngitis, unspecified: Secondary | ICD-10-CM

## 2016-10-24 DIAGNOSIS — Z21 Asymptomatic human immunodeficiency virus [HIV] infection status: Secondary | ICD-10-CM | POA: Diagnosis present

## 2016-10-24 DIAGNOSIS — E860 Dehydration: Secondary | ICD-10-CM | POA: Diagnosis not present

## 2016-10-24 DIAGNOSIS — R1310 Dysphagia, unspecified: Secondary | ICD-10-CM | POA: Diagnosis not present

## 2016-10-24 DIAGNOSIS — B20 Human immunodeficiency virus [HIV] disease: Secondary | ICD-10-CM | POA: Diagnosis present

## 2016-10-24 DIAGNOSIS — R809 Proteinuria, unspecified: Secondary | ICD-10-CM | POA: Diagnosis not present

## 2016-10-24 DIAGNOSIS — I2699 Other pulmonary embolism without acute cor pulmonale: Secondary | ICD-10-CM | POA: Diagnosis present

## 2016-10-24 DIAGNOSIS — J039 Acute tonsillitis, unspecified: Secondary | ICD-10-CM | POA: Diagnosis not present

## 2016-10-24 LAB — EPSTEIN-BARR VIRUS VCA, IGG

## 2016-10-24 LAB — CBC
HCT: 40.8 % (ref 39.0–52.0)
Hemoglobin: 13.6 g/dL (ref 13.0–17.0)
MCH: 30 pg (ref 26.0–34.0)
MCHC: 33.3 g/dL (ref 30.0–36.0)
MCV: 90.1 fL (ref 78.0–100.0)
Platelets: 243 10*3/uL (ref 150–400)
RBC: 4.53 MIL/uL (ref 4.22–5.81)
RDW: 13.9 % (ref 11.5–15.5)
WBC: 4.2 10*3/uL (ref 4.0–10.5)

## 2016-10-24 LAB — BASIC METABOLIC PANEL
Anion gap: 9 (ref 5–15)
BUN: 6 mg/dL (ref 6–20)
CALCIUM: 8.5 mg/dL — AB (ref 8.9–10.3)
CHLORIDE: 106 mmol/L (ref 101–111)
CO2: 21 mmol/L — ABNORMAL LOW (ref 22–32)
Creatinine, Ser: 0.74 mg/dL (ref 0.61–1.24)
GFR calc Af Amer: >60 mL/min (ref >60)
GLUCOSE: 87 mg/dL (ref 65–99)
POTASSIUM: 4.2 mmol/L (ref 3.5–5.1)
Sodium: 136 mmol/L (ref 135–145)

## 2016-10-24 LAB — EPSTEIN-BARR VIRUS VCA, IGM

## 2016-10-24 LAB — HIV ANTIBODY (ROUTINE TESTING W REFLEX): HIV SCREEN 4TH GENERATION: REACTIVE — AB

## 2016-10-24 LAB — CMV IGM

## 2016-10-24 LAB — HIV 1/2 AB DIFFERENTIATION
HIV 1 AB: POSITIVE — AB
HIV 2 AB: UNDETERMINED

## 2016-10-24 MED ORDER — IOPAMIDOL (ISOVUE-300) INJECTION 61%
INTRAVENOUS | Status: AC
  Administered 2016-10-24: 75 mL
  Filled 2016-10-24: qty 75

## 2016-10-24 NOTE — Consult Note (Signed)
Reason for Consult: Sore throat Referring Physician: Annia Belt, MD  Micheal Leonard is an 21 y.o. male.  HPI: 3 week history of sore throat. It has been so bad that he really hasn't eaten any regular food for almost the entire time. The amount of weight according to the mother. EBV titers were positive on admission. Significant cardiovascular history with congenital abnormality.  Past Medical History:  Diagnosis Date  . Cardiac anomaly, congenital   . EBV infection    hx/notes 10/23/2016  . Hemoptysis 10/23/2016   Micheal Leonard 10/23/2016  . Hypertension   . Migraine    "a few/year now; frequency is less than in the past" (10/23/2016)  . Pharyngitis 10/23/2016   notes 10/23/2016  . Pulmonary embolism ([place]) 01/29/2016  . S/P bidirectional Glenn shunt    hx/notes 10/23/2016  . S/P Fontan procedure    hx/notes 10/23/2016  . Tricuspid atresia and stenosis, congenital     Past Surgical History:  Procedure Laterality Date  . CARDIAC CATHETERIZATION  01/2016   at [facility]; hx/notes 10/23/2016  . CARDIAC SURGERY  01/1997; 2000   fontane procedure at [facility] as a child     Family History  Problem Relation Age of Onset  . Hypertension Father     Social History:  reports that he has never smoked. He has never used smokeless tobacco. He reports that he does not drink alcohol or use drugs.  Allergies: No Known Allergies  Medications: Reviewed  Results for orders placed or performed during the hospital encounter of 10/23/16 (from the past 48 hour(s))  Basic metabolic panel     Status: Abnormal   Collection Time: 10/22/16 11:20 PM  Result Value Ref Range   Sodium 135 135 - 145 mmol/L   Potassium 3.7 3.5 - 5.1 mmol/L   Chloride 99 (L) 101 - 111 mmol/L   CO2 25 22 - 32 mmol/L   Glucose, Bld 143 (H) 65 - 99 mg/dL   BUN 8 6 - 20 mg/dL   Creatinine, Ser 0.94 0.61 - 1.24 mg/dL   Calcium 9.4 8.9 - 10.3 mg/dL   GFR calc non Af Amer >60 >60 mL/min   GFR calc Af Amer >60 >60 mL/min    Comment:  (NOTE) The eGFR has been calculated using the CKD EPI equation. This calculation has not been validated in all clinical situations. eGFR's persistently <60 mL/min signify possible Chronic Kidney Disease.    Anion gap 11 5 - 15  CBC     Status: None   Collection Time: 10/22/16 11:20 PM  Result Value Ref Range   WBC 5.2 4.0 - 10.5 K/uL   RBC 5.26 4.22 - 5.81 MIL/uL   Hemoglobin 16.4 13.0 - 17.0 g/dL   HCT 46.2 39.0 - 52.0 %   MCV 87.8 78.0 - 100.0 fL   MCH 31.2 26.0 - 34.0 pg   MCHC 35.5 30.0 - 36.0 g/dL   RDW 13.9 11.5 - 15.5 %   Platelets 239 150 - 400 K/uL  I-stat troponin, ED     Status: None   Collection Time: 10/22/16 11:29 PM  Result Value Ref Range   Troponin i, poc 0.00 0.00 - 0.08 ng/mL   Comment 3            Comment: Due to the release kinetics of cTnI, a negative result within the first hours of the onset of symptoms does not rule out myocardial infarction with certainty. If myocardial infarction is still suspected, repeat the test at appropriate  intervals.   Protime-INR     Status: Abnormal   Collection Time: 10/23/16  4:04 AM  Result Value Ref Range   Prothrombin Time 15.8 (H) 11.4 - 15.2 seconds   INR 0.31   Salicylate level     Status: None   Collection Time: 10/23/16  6:43 AM  Result Value Ref Range   Salicylate Lvl <5.9 2.8 - 30.0 mg/dL  Acetaminophen level     Status: Abnormal   Collection Time: 10/23/16  6:43 AM  Result Value Ref Range   Acetaminophen (Tylenol), Serum <10 (L) 10 - 30 ug/mL    Comment:        THERAPEUTIC CONCENTRATIONS VARY SIGNIFICANTLY. A RANGE OF 10-30 ug/mL MAY BE AN EFFECTIVE CONCENTRATION FOR MANY PATIENTS. HOWEVER, SOME ARE BEST TREATED AT CONCENTRATIONS OUTSIDE THIS RANGE. ACETAMINOPHEN CONCENTRATIONS >150 ug/mL AT 4 HOURS AFTER INGESTION AND >50 ug/mL AT 12 HOURS AFTER INGESTION ARE OFTEN ASSOCIATED WITH TOXIC REACTIONS.   Ethanol     Status: None   Collection Time: 10/23/16  6:43 AM  Result Value Ref Range    Alcohol, Ethyl (B) <5 <5 mg/dL    Comment:        LOWEST DETECTABLE LIMIT FOR SERUM ALCOHOL IS 5 mg/dL FOR MEDICAL PURPOSES ONLY   Hepatic function panel     Status: Abnormal   Collection Time: 10/23/16  6:43 AM  Result Value Ref Range   Total Protein 8.5 (H) 6.5 - 8.1 g/dL   Albumin 3.2 (L) 3.5 - 5.0 g/dL   AST 33 15 - 41 U/L   ALT 25 17 - 63 U/L   Alkaline Phosphatase 122 38 - 126 U/L   Total Bilirubin 1.0 0.3 - 1.2 mg/dL   Bilirubin, Direct 0.5 0.1 - 0.5 mg/dL   Indirect Bilirubin 0.5 0.3 - 0.9 mg/dL  Urine rapid drug screen (hosp performed)     Status: None   Collection Time: 10/23/16  6:51 AM  Result Value Ref Range   Opiates NONE DETECTED NONE DETECTED   Cocaine NONE DETECTED NONE DETECTED   Benzodiazepines NONE DETECTED NONE DETECTED   Amphetamines NONE DETECTED NONE DETECTED   Tetrahydrocannabinol NONE DETECTED NONE DETECTED   Barbiturates NONE DETECTED NONE DETECTED    Comment:        DRUG SCREEN FOR MEDICAL PURPOSES ONLY.  IF CONFIRMATION IS NEEDED FOR ANY PURPOSE, NOTIFY LAB WITHIN 5 DAYS.        LOWEST DETECTABLE LIMITS FOR URINE DRUG SCREEN Drug Class       Cutoff (ng/mL) Amphetamine      1000 Barbiturate      200 Benzodiazepine   458 Tricyclics       592 Opiates          300 Cocaine          300 THC              50   Urinalysis, Routine w reflex microscopic     Status: Abnormal   Collection Time: 10/23/16  6:51 AM  Result Value Ref Range   Color, Urine AMBER (A) YELLOW    Comment: BIOCHEMICALS MAY BE AFFECTED BY COLOR   APPearance CLEAR CLEAR   Specific Gravity, Urine >1.046 (H) 1.005 - 1.030   pH 5.0 5.0 - 8.0   Glucose, UA NEGATIVE NEGATIVE mg/dL   Hgb urine dipstick NEGATIVE NEGATIVE   Bilirubin Urine NEGATIVE NEGATIVE   Ketones, ur 5 (A) NEGATIVE mg/dL   Protein, ur 30 (A) NEGATIVE mg/dL  Nitrite NEGATIVE NEGATIVE   Leukocytes, UA NEGATIVE NEGATIVE   RBC / HPF 0-5 0 - 5 RBC/hpf   WBC, UA 0-5 0 - 5 WBC/hpf   Bacteria, UA NONE SEEN NONE  SEEN   Squamous Epithelial / LPF NONE SEEN NONE SEEN   Mucous PRESENT   Protein / creatinine ratio, urine     Status: Abnormal   Collection Time: 10/23/16  6:51 AM  Result Value Ref Range   Creatinine, Urine 233.03 mg/dL   Total Protein, Urine 73 mg/dL    Comment: NO NORMAL RANGE ESTABLISHED FOR THIS TEST   Protein Creatinine Ratio 0.31 (H) 0.00 - 0.15 mg/mg[Cre]  D-dimer, quantitative (not at [facility])     Status: Abnormal   Collection Time: 10/23/16  8:47 AM  Result Value Ref Range   D-Dimer, Quant 0.81 (H) 0.00 - 0.50 ug/mL-FEU    Comment: (NOTE) At the manufacturer cut-off of 0.50 ug/mL FEU, this assay has been documented to exclude PE with a sensitivity and negative predictive value of 97 to 99%.  At this time, this assay has not been approved by the FDA to exclude DVT/VTE. Results should be correlated with clinical presentation.   Epstein-Barr virus VCA, IgG     Status: Abnormal   Collection Time: 10/23/16 11:47 AM  Result Value Ref Range   EBV VCA IgG >600.0 (H) 0.0 - 17.9 U/mL    Comment: (NOTE)                                 Negative        <18.0                                 Equivocal 18.0 - 21.9                                 Positive        >21.9 Performed At: [facility] [place], [place] [id number] Lindon Romp MD [id number]   Epstein-Barr virus VCA, IgM     Status: None   Collection Time: 10/23/16 11:47 AM  Result Value Ref Range   EBV VCA IgM <36.0 0.0 - 35.9 U/mL    Comment: (NOTE)                                 Negative        <36.0                                 Equivocal 36.0 - 43.9                                 Positive        >43.9 REPORT EMAILED @1146  10/24/16 Performed At: [facility] [place], [place] [id number] Lindon Romp MD [id number]   CMV IgM     Status: None   Collection Time: 10/23/16 11:47 AM  Result Value Ref Range   CMV IgM <30.0 0.0 - 29.9 AU/mL    Comment: (NOTE)  Negative         <30.0                                Equivocal  30.0 - 34.9                                Positive         >34.9 A positive result is generally indicative of acute infection, reactivation or persistent IgM production. Performed At: [facility] [place], [place] [id number] Lindon Romp MD [id number]   Rapid strep screen (not at [facility])     Status: None   Collection Time: 10/23/16 12:31 PM  Result Value Ref Range   Streptococcus, Group A Screen (Direct) NEGATIVE NEGATIVE    Comment: (NOTE) A Rapid Antigen test may result negative if the antigen level in the sample is below the detection level of this test. The FDA has not cleared this test as a stand-alone test therefore the rapid antigen negative result has reflexed to a Group A Strep culture.   Culture, group A strep     Status: None (Preliminary result)   Collection Time: 10/23/16 12:31 PM  Result Value Ref Range   Specimen Description THROAT    Special Requests NONE Reflexed from [id number]    Culture CULTURE REINCUBATED FOR BETTER GROWTH    Report Status PENDING   Basic metabolic panel     Status: Abnormal   Collection Time: 10/24/16  2:44 AM  Result Value Ref Range   Sodium 136 135 - 145 mmol/L   Potassium 4.2 3.5 - 5.1 mmol/L   Chloride 106 101 - 111 mmol/L   CO2 21 (L) 22 - 32 mmol/L   Glucose, Bld 87 65 - 99 mg/dL   BUN 6 6 - 20 mg/dL   Creatinine, Ser 0.74 0.61 - 1.24 mg/dL   Calcium 8.5 (L) 8.9 - 10.3 mg/dL   GFR calc non Af Amer >60 >60 mL/min   GFR calc Af Amer >60 >60 mL/min    Comment: (NOTE) The eGFR has been calculated using the CKD EPI equation. This calculation has not been validated in all clinical situations. eGFR's persistently <60 mL/min signify possible Chronic Kidney Disease.    Anion gap 9 5 - 15  CBC     Status: None   Collection Time: 10/24/16  2:44 AM  Result Value Ref Range   WBC 4.2 4.0 - 10.5 K/uL   RBC 4.53 4.22 - 5.81  MIL/uL   Hemoglobin 13.6 13.0 - 17.0 g/dL   HCT 40.8 39.0 - 52.0 %   MCV 90.1 78.0 - 100.0 fL   MCH 30.0 26.0 - 34.0 pg   MCHC 33.3 30.0 - 36.0 g/dL   RDW 13.9 11.5 - 15.5 %   Platelets 243 150 - 400 K/uL    Dg Chest 2 View  Result Date: 10/22/2016 CLINICAL DATA:  Upper chest pain and cough with fever x3 weeks EXAM: CHEST  2 VIEW COMPARISON:  10/09/2016 CXR FINDINGS: Cardiac and mediastinal closure devices are again seen. No pneumonic consolidation, effusion or pneumothorax. Heart size is within normal limits. No aneurysmal dilatation of the thoracic aorta. No acute osseous abnormality. IMPRESSION: No active cardiopulmonary disease. Cardiac and mediastinal closure devices are again noted. Electronically Signed   By: Ashley Royalty M.D.   On: 10/22/2016 23:44  Ct Angio Chest Pe W/cm &/or Wo Cm  Result Date: 10/23/2016 CLINICAL DATA:  History of tricuspid atresia and Fontan procedure. Hemoptysis. EXAM: CT ANGIOGRAPHY CHEST WITH CONTRAST TECHNIQUE: Multidetector CT imaging of the chest was performed using the standard protocol during bolus administration of intravenous contrast. Multiplanar CT image reconstructions and MIPs were obtained to evaluate the vascular anatomy. CONTRAST:  100 mL Isovue 370 IV COMPARISON:  CTA chest 01/29/2016. FINDINGS: Cardiovascular: The right ventricle is hypoplastic. There is a bidirectional Eulas Post connection connecting the superior vena cava and right pulmonary artery. There is heterogeneous contrast opacification within this connection, likely secondary to mixing. 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 There is limited contrast opacification within this conduit, unchanged compared to the prior study. This is likely due to venous return of non-contrast-opacified blood from the lower systemic circulation, rather than thrombus. There is a large filling defect within the right pulmonary  artery, relatively unchanged from the prior study, and likely mixing artifact caused by non-opacified systemic venous return blood mixing with the contrast enhanced pulmonary arterial blood. There is attenuated opacification of the right lower and middle lobar and segmental arteries. These findings are similar to the prior study, although there is better opacification of the right upper lobe segmental arteries on the current examination. There is no evidence of right heart strain, though the RV:LV ratio is meaningless in this patient. There is no pulmonary arteriovenous malformation. Mediastinum/Nodes: No enlarged mediastinal, hilar, or axillary lymph nodes. Thyroid gland, trachea, and esophagus demonstrate no significant findings. Lungs/Pleura: Lungs are clear. No pleural effusion or pneumothorax. Upper Abdomen: No acute abnormality. Musculoskeletal: No chest wall abnormality. No acute or significant osseous findings. Review of the MIP images confirms the above findings. IMPRESSION: 1. 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 2. Heterogeneously diminished opacification of the right pulmonary artery and right middle and lower lobar and segmental arteries is favored to be due to mixing artifact as non-opacified blood from the systemic venous return mixes with the contrast-enhanced blood in the pulmonary artery. Some degree of superimposed thrombus, however, would be difficult to exclude. Echocardiography would probably be helpful in this regard. 3. No pulmonary arteriovenous malformation or other finding to explain the reported hemoptysis. 4. Clear lungs. Electronically Signed   By: Ulyses Jarred M.D.   On: 10/23/2016 05:07    [id number] except as listed in admit H&P  Blood pressure 120/67, pulse 91, temperature 99.1 F (37.3 C), temperature source Oral,  resp. rate 16, height 6' (1.829 m), weight 69.6 kg (153 lb 6.4 oz), SpO2 99 %.  PHYSICAL EXAM: Overall appearance:  Healthy appearing, in no distress Head:  Normocephalic, atraumatic. Ears: External ears look healthy. Nose: External nose is healthy in appearance. Internal nasal exam free of any lesions or obstruction. Oral Cavity/Pharynx:  He has mild trismus. There is asymmetric swelling of the right tonsil, with some fullness of the soft palate as well. There is some surface necrosis/exudate on the right tonsil.. Larynx/Hypopharynx: Deferred Neuro:  No identifiable neurologic deficits. Neck: Tender right level II adenopathy.  Studies Reviewed: none  Procedures: none   Assessment/Plan: Acute tonsillitis that may be Mono related which could explain the longevity of the symptoms. He does have a little bit of trismus which suggests the possibility of a peritonsillar abscess. Him and CT  evaluation to rule out out. If there is an abscess with drainage at this evening, if not, then we can continue with medical management.  Micheal Leonard 10/24/2016, 1:31 PM

## 2016-10-24 NOTE — Progress Notes (Addendum)
Subjective: Patient is doing well this morning. Tachycardia has resolved with fluids. He continues to complain of sore throat and odynophagia. He is tolerating liquids.   Objective:  Vital signs in last 24 hours: Vitals:   10/23/16 1731 10/23/16 1900 10/24/16 0426 10/24/16 0428  BP: 124/70 119/61 120/67   Pulse: 93 92 91   Resp: 20  16   Temp: 98.8 F (37.1 C) 99.7 F (37.6 C) 99.1 F (37.3 C)   TempSrc: Oral Oral Oral   SpO2: 97% 99% 99%   Weight: 152 lb 9.6 oz (69.2 kg)   153 lb 6.4 oz (69.6 kg)  Height: 6' (1.829 m)      Physical Exam Constitutional: NAD, appears comfortable HEENT: Posterior pharynx with erythema and mild petechia, bilateral tonsillar enlargement (R>L). Very small, pea sized submental lymph node. No other lymphadenopathy appreciated on exam.  Neck: Supple, trachea midline.  Cardiovascular: RRR, no murmurs, rubs, or gallops.  Pulmonary/Chest: CTAB, no wheezes, rales, or rhonchi.  Abdominal: Soft, non tender, non distended. +BS.  Extremities: Warm and well perfused. No edema.  Neurological: A&Ox3, CN II - XII grossly intact.   Assessment/Plan:  Mr. Eddleman is a 21 yo M with pmhx of tricuspid atresia, small VSD (s/p surgical repair with [name] operation and Fontan surgery in 2002) who presents to the ED for the second time with dehydration and complaints of sore throat, malaise, and odynophagia.   Dehydration: Due to odynophagia and poor po intake. Resolved with IVFs.   Asymmetric Tonsillitis: Patient presented with a 3 weeks history of sore throat, malaise, and odynophagia. This is his second presentation for this complaint. He initially presented to the ED 10/09/2016 after a presyncopal episode and fall resulting in a mild concussion. Symptoms were felt to be dehydration due to poor PO intake from his pharyngitis. Rapid strep swab at that time was negative. He was treated with IVFs and discharged from the ED. He is now presenting two weeks later with  persistent sore throat and pain with swallowing. Rapid strep again was negative. On exam, he does have posterior oropharynx erythema and asymmetrically enlarged tonsils (R>L). Viral work up pending.  -- ENT consult, appreciate recommendations  -- HIV, EBV, CMV, parovirus, and RSV -- NSAIDs -- Magic Mouthwash   Hx of EBV: Patient had extensive work up in July of 2017 after presenting to [facility] ED with complain of abdominal pain and SOB. There was some initial concern for PE and he was transferred to [facility] for further management. PE was ultimately ruled out. Due to his congenital heart disease and surgical repair, he has mixing of venous and arterial blood in his pulmonary arteries that make CTA interpretation very difficult. He was however noted to have extensive lymphadenopathy (mesorectal, IMA, and inguinal lymph nodes) for which he under went EGD and colonoscopy. These were overall normal aside from erythematous mucosa 5cm proximal to the anus with evidence of prolapse. During this work up, his EBV serologies resulted positive. He was discharged with supportive measures for EBV and given miralax and steroid suppositories for his prolapse.   Proteinuria: Patient follows with [facility] pediatric nephrology for proteinuria and chronic tea-colored urine that were found during his hospitalization in July of 2017. Extensive work up per chart review was negative. C3 and C4 compliment were normal and there was no evidence to suggest active immune complex glomerulonephritis. He followed up with nephrology 3 months after his prior discharge and repeat protein/creatinine ration at that time was normal. This admission,  he continues to have chronic tea-colored urine. UA was notable for proteinuria and elevated specific gravity (negative hgb, bilirubin, bacteria). Repeat protein/creatinine ratio this admission is again elevated. Unclear cause for patient's ongoing proteinuria. If patient stays tonight, we will collect a  24 hour urine protein for further work up.   Hx of Tricuspid Atresia, SVD, with normal great vessels: S/p surgical repair in 2002 with [facility] operation and subsequent Fontan surgery. He takes daily aspirin to prevent Fontan thrombosis.  -- Continue ASA 81 daily   FEN: No fluids, replete lytes prn, liquid diet VTE ppx: Lovenox  Code Status: FULL   Dispo: Anticipated discharge pending ENT consultation and recommendations.   Reymundo Poll, MD 10/24/2016, 11:14 AM Pager: [phone]

## 2016-10-24 NOTE — Progress Notes (Signed)
      INFECTIOUS DISEASE ATTENDING ADDENDUM:   Date: 10/24/2016  Patient name: Micheal Leonard  Medical record number: [id number]  Date of birth: 25-Feb-1996    JUST ALERTED BY Terrilyn Saver TO PATIENT'S POSITIVE HIV TEST  I suspect his entire constellation of symptoms are related to (relatively) acute HIV though if no one has tested him for gonorrhea with OP probe that would also be a very good idea.  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  I am ordering HIV ultraquant RNA with reflex to genotype, CD4, hep surface ag, HLA b701, RPR tonight, additional test tomorrow.  Again he also needs OP probe for GC and chlamydia (which I will order now), test in urine (rectal swab as well tomorrow)  I will see the patient tomorrow morning to spend time counseling him on his new diagnosis  and start him on antivirals. 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        Alcide Evener 10/24/2016, 6:24 PM

## 2016-10-25 ENCOUNTER — Other Ambulatory Visit: Payer: Self-pay | Admitting: Pharmacist Clinician (PhC)/ Clinical Pharmacy Specialist

## 2016-10-25 ENCOUNTER — Observation Stay (HOSPITAL_BASED_OUTPATIENT_CLINIC_OR_DEPARTMENT_OTHER): Payer: Commercial Managed Care - HMO

## 2016-10-25 ENCOUNTER — Telehealth: Payer: Self-pay | Admitting: Internal Medicine

## 2016-10-25 ENCOUNTER — Other Ambulatory Visit: Payer: Self-pay | Admitting: Pharmacist

## 2016-10-25 DIAGNOSIS — J039 Acute tonsillitis, unspecified: Secondary | ICD-10-CM | POA: Diagnosis not present

## 2016-10-25 DIAGNOSIS — F88 Other disorders of psychological development: Secondary | ICD-10-CM

## 2016-10-25 DIAGNOSIS — I2699 Other pulmonary embolism without acute cor pulmonale: Secondary | ICD-10-CM | POA: Diagnosis not present

## 2016-10-25 DIAGNOSIS — R809 Proteinuria, unspecified: Secondary | ICD-10-CM | POA: Diagnosis not present

## 2016-10-25 DIAGNOSIS — Z7252 High risk homosexual behavior: Secondary | ICD-10-CM

## 2016-10-25 DIAGNOSIS — Z21 Asymptomatic human immunodeficiency virus [HIV] infection status: Secondary | ICD-10-CM

## 2016-10-25 DIAGNOSIS — Z7901 Long term (current) use of anticoagulants: Secondary | ICD-10-CM | POA: Diagnosis not present

## 2016-10-25 DIAGNOSIS — J029 Acute pharyngitis, unspecified: Secondary | ICD-10-CM | POA: Diagnosis not present

## 2016-10-25 DIAGNOSIS — F819 Developmental disorder of scholastic skills, unspecified: Secondary | ICD-10-CM

## 2016-10-25 DIAGNOSIS — Z9114 Patient's other noncompliance with medication regimen: Secondary | ICD-10-CM

## 2016-10-25 DIAGNOSIS — Q224 Congenital tricuspid stenosis: Secondary | ICD-10-CM

## 2016-10-25 DIAGNOSIS — Z8249 Family history of ischemic heart disease and other diseases of the circulatory system: Secondary | ICD-10-CM | POA: Diagnosis not present

## 2016-10-25 LAB — PROTEIN, URINE, 24 HOUR
Collection Interval-UPROT: 24 hours
Protein, Urine: <6 mg/dL
URINE TOTAL VOLUME-UPROT: 2500 mL

## 2016-10-25 LAB — CREATININE, URINE, 24 HOUR
COLLECTION INTERVAL-UCRE24: 24 h
CREATININE 24H UR: 1142 mg/d (ref 800–2000)
CREATININE, URINE: 45.68 mg/dL
URINE TOTAL VOLUME-UCRE24: 2500 mL

## 2016-10-25 LAB — RPR: RPR: NONREACTIVE

## 2016-10-25 LAB — RSV(RESPIRATORY SYNCYTIAL VIRUS) AB, BLOOD: RSV Ab: 1:16 {titer} — ABNORMAL HIGH

## 2016-10-25 LAB — ECHOCARDIOGRAM COMPLETE
Height: 72 in
WEIGHTICAEL: 2451.52 [oz_av]

## 2016-10-25 LAB — CD4/CD8 (T-HELPER/T-SUPPRESSOR CELL)
CD4 absolute: 180 /uL — ABNORMAL LOW (ref 500–1900)
CD4%: 23 % — ABNORMAL LOW (ref 30.0–60.0)
CD8 T CELL ABS: 280 /uL (ref 230–1000)
CD8TOX: 37 % (ref 15.0–40.0)
Ratio: 0.64 — ABNORMAL LOW (ref 1.0–3.0)
TOTAL LYMPHOCYTE COUNT: 760 /uL — AB (ref 1000–4000)

## 2016-10-25 LAB — CULTURE, GROUP A STREP (THRC)

## 2016-10-25 LAB — ADENOVIRUS ANTIBODIES: Adenovirus Antibody: 1:128 {titer} — ABNORMAL HIGH

## 2016-10-25 MED ORDER — EMTRICITABINE-TENOFOVIR AF 200-25 MG PO TABS: 1.0000 | ORAL_TABLET | Freq: Every day | ORAL | 5 refills | Status: DC

## 2016-10-25 MED ORDER — DOLUTEGRAVIR SODIUM 50 MG PO TABS: 50.0000 mg | ORAL_TABLET | Freq: Every day | ORAL | 5 refills | Status: DC

## 2016-10-25 MED ORDER — DOLUTEGRAVIR SODIUM 50 MG PO TABS
50.0000 mg | ORAL_TABLET | Freq: Every day | ORAL | Status: DC
  Administered 2016-10-25: 50 mg via ORAL
  Filled 2016-10-25: qty 1

## 2016-10-25 MED ORDER — ASPIRIN 81 MG PO TBEC: 81.0000 mg | DELAYED_RELEASE_TABLET | Freq: Every day | ORAL | 5 refills | Status: DC

## 2016-10-25 MED ORDER — EMTRICITABINE-TENOFOVIR AF 200-25 MG PO TABS
1.0000 | ORAL_TABLET | Freq: Every day | ORAL | Status: DC
  Administered 2016-10-25: 1 via ORAL
  Filled 2016-10-25: qty 1

## 2016-10-25 MED FILL — TIVICAY 50 MG TABLET: 50 | 30 days supply | Qty: 30 | Fill #0

## 2016-10-25 MED FILL — DESCOVY 200-25 MG TABS: 200-25 | 30 days supply | Qty: 30 | Fill #0

## 2016-10-25 NOTE — Discharge Summary (Signed)
Name: Micheal Leonard MRN: [id number] DOB: [birth year] 21 y.o. PCP: No Pcp Per Patient  Date of Admission: 10/23/2016  3:27 AM Date of Discharge: 10/25/2016 Attending Physician: Annia Belt, MD  Discharge Diagnosis: 1. Acute HIV 2. Tonsillitis  3. Cognitive Delay   Discharge Medications: Allergies as of 10/25/2016   No Known Allergies     Medication List    TAKE these medications   aspirin 81 MG EC tablet Take 1 tablet (81 mg total) by mouth daily.   dolutegravir 50 MG tablet Commonly known as:  TIVICAY Take 1 tablet (50 mg total) by mouth daily.   emtricitabine-tenofovir AF 200-25 MG tablet Commonly known as:  DESCOVY Take 1 tablet by mouth daily.       Disposition and follow-up:   Micheal Leonard was discharged from [facility] in Stable condition.  At the hospital follow up visit please address:  1.  Acute HIV: HIV ab checked during previous hospitalization in September of 2017 were nonreactive, now with reactive Ab this admission. Felt to be relatively acute per ID given his symptoms of malaise and tonsillitis. Discharged on Tivicay and Descovy per ID. High risk for abuse given his mental cognitive delay. Please screen carefully for sexual abuse. Patient lives with his mom but is home alone during the day while she works. Full sexually history was unable to be obtained as his mother was hovering during the entire hospitalization. Patient endorsed that he was sexually active, gender preference not specified.   2. Hx of Tricuspid Atresia: S/p surgical repair in 2002 with [facility] operation and subsequent Fontan surgery. He should follow up with cardiology annually. Was previously lost to follow up for 10 years, then reappeared in 2013. Per care everywhere, he was last seen at [facility] in September of 2017. Previously non compliant with daily aspirin which he takes to prevent Fontan thombosis. Please encourage compliance with aspirin 81 mg daily.   3.  Proteinuria: Chronic, follows with [facility]. Unclear etiology.   4. Cognitive Delay: Confirmed with mom that he has some degree of cognitive delay. Unclear how much he understands of his new HIV diagnosis or other medical problems for that matter.   5.  Labs / imaging needed at time of follow-up: None   6.  Pending labs/ test needing follow-up: 24-hour urine protein; oropharyngeal, urine, and rectal swab for gonorrhea and chlamydia swab  Follow-up Appointments: Follow-up Information    [facility]. Go on 11/01/2016.   Why:  at 2:45 pm for hospital follow up. Our clinic is located on the ground floor of the hospital in the [place] wing. Please arrive 15 minutes early and bring your medications.  Contact information: [address] [place], [place]. Schedule an appointment as soon as possible for a visit.   Specialty:  Infectious Diseases Contact information: [address] [place] [address] [phone]           Hospital Course by problem list:  1. Acute HIV: Patient presented with 3 week history of sore throat, malaise, and odynophagia. This was his second presentation for this complaint. He initially presented to the ED 10/09/2016 after a presyncopal episode and fall resulting in a mild concussion. Symptoms were felt to be dehydration due to poor PO intake from his pharyngitis. Rapid strep swab at that time was negative. He was treated with IVFs and discharged from the ED.  He presented this admission 2 weeks later with persistent sore throat and pain with swallowing. Rapid strep again was negative. Exam was notable for posterior oropharynx erythema and asymmetric tonsillitis (R>L). He was admitted for IVFs and ENT was consulted due to concern for tonsillar abscess. CT neck revealed asymmetrically enlarged right palatine tonsil without evidence of acute tonsillitis or  peritonsillar abscess. Unfortunately, the evening of admission, his HIV antibody screen resulted positive. Infectious disease was auto-consulted. HIV ultraquant RNA with reflex genotype, CD4, hep surgace ag, HLA b701, RPR were ordered for further work up and pending on discharge. Oropharyngeal, rectal, and urine probe for gonorrhea and chlamydia were also collected for further work up of his symptoms and pending on discharge. Patient was started on Tivicay and Descovy and prescriptions were delivered to bedside prior to discharge. Of note, patient has a developmental cognitive delay of some degree and it is unclear how much of this diagnosis he understands. After obtaining permission from the patient, the diagnosis was discussed with his mother who is his primary care give. The importance of compliance with his HIV meds were emphasized. Plan to follow up with ID 1-2 weeks and with [facility] 11/01/2016.   2. Proteinuria:Patient follows with [facility] for proteinuria and chronic tea-colored urine that were found during his hospitalization in July of 2017. Extensive work up per chart review was negative. C3 and C4 compliment were normal and there was no evidence to suggest active immune complex glomerulonephritis. He followed up with nephrology 3 months after his prior discharge and repeat protein/creatinine ration at that time was normal. This admission, he continues to have chronic tea-colored urine. UA was notable for proteinuria and elevated specific gravity (negative hgb, bilirubin, bacteria). Repeat protein/creatinine ratio this admission is again elevated. Unclear cause for patient's ongoing proteinuria. 24 hour urine protein was collected this hospitalization and pending on discharge.  3. Hx of Tricuspid Atresia, SVD, with normal great vessels: S/p surgical repair in 2002 with [facility] operation and subsequent Fontan surgery. He takes daily aspirin to prevent Fontan thrombosis. This was continued on  admission. Of note - the ED originally consulted IMTS for admission due to concern for PE. Due to his cardiac repair surgery he has mixing of arterial and venous blood that make interpretation of CTA chest nearly impossible. Imaging was reviewed personally with radiology. He denied chest pain and he was sating well on room air. His only symptom concerning for PE was tachycardia which resolved with IVFs make PE highly unlikely. Of note, patient should be seen by cardiology annually. His surgery was in February of 2002. He was subsequently lost to follow up for 10 years. In 2013, cardiology recommended ASA to prevent Fontan thrombus. In 2015, cardiac MRI was recommended to visualize anatomy and Glenn/Fontan circuit which showed Tricuspid atresia with normally related great arteries s/p Fontan with widely patent SVC, IVC, and Fontan connections with no pulmonary artery stenosis. Normal MV and low-normal LV EF. 24 hour holter monitor in 2015 were normal. Notes from 2017 indicate patient was NOT compliant with daily ASA. Patient was admitted to [facility] in September of 2017 and underwent extensive work up at that time due to concern for PE. Cardiac catheterization in 2017 ruled out pulmonary embolism and thrombosis in Fontan.   4. Cognitive Delay: Patient has some degree of developmental cognitive delay. Unclear how much of his diagnosis and medical problems he understands. Per mom, he graduated from high school. Shortly after his conversation with Dr. Tommy Medal regarding his  new HIV diagnosis, IMTS rounded and patient could not recall any of the conversation and was unaware that he had HIV. He has a very flat affect and did not respond emotionally to his diagnosis. Mom reports he has issues with memory.   Discharge Vitals:   BP 117/65 (BP Location: Right Arm)   Pulse 99   Temp 99 F (37.2 C) (Oral)   Resp 18   Ht 6' (1.829 m)   Wt 153 lb 3.5 oz (69.5 kg)   SpO2 97%   BMI 20.78 kg/m   Pertinent Labs, Studies,  and Procedures:   10/23/2016 CTA Chest:  IMPRESSION: 1. 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 2. Heterogeneously diminished opacification of the right pulmonary artery and right middle and lower lobar and segmental arteries is favored to be due to mixing artifact as non-opacified blood from the systemic venous return mixes with the contrast-enhanced blood in the pulmonary artery. Some degree of superimposed thrombus, however, would be difficult to exclude. Echocardiography would probably be helpful in this regard. 3. No pulmonary arteriovenous malformation or other finding to explain the reported hemoptysis. 4. Clear lungs.  10/24/2016 CT Soft Tissue Neck W Contrast:  IMPRESSION: Slight asymmetrically enlarged RIGHT palatine tonsil without CT findings of acute tonsillitis or, peritonsillar abscess.  CT findings of pharyngeal mucosal ulceration, recommend direct inspection.  Mild reactive cervical lymphadenopathy.   Discharge Instructions: Discharge Instructions    Call MD for:  difficulty breathing, headache or visual disturbances    Complete by:  As directed    Call MD for:  persistant dizziness or light-headedness    Complete by:  As directed    Call MD for:  persistant nausea and vomiting    Complete by:  As directed    Call MD for:  severe uncontrolled pain    Complete by:  As directed    Call MD for:  temperature >100.4    Complete by:  As directed    Diet - low sodium heart healthy    Complete by:  As directed    Discharge instructions    Complete by:  As directed    Mr. Clymer,   It was a pleasure taking care of you, I hope you feel better soon!  It is very important that you take your new medications. You have been started on Tivicay (dolutegravir) and Descovy (emtricitabine-tenofovir) daily for  your HIV. You are scheduled to follow up in the Internal Medicine clinic next Friday the 20th at 2:45pm. Please keep this appointment and bring your medications with you. You will also need to follow up with Dr. Tommy Medal (infectious disease). You should be contacted for an appointment. If you do not hear from their office, please give them a call. I have also sent a prescription for aspirin to your pharmacy. Please take a baby aspirin (81 mg) daily to prevent clot formation in your heart from your previous surgeries. If you have any questions or concerns, call our clinic at [phone] or after hours call [phone] and ask for the internal medicine resident on call. Thank you!  - Dr. Philipp Ovens   Increase activity slowly    Complete by:  As directed       Signed: Velna Ochs, MD 10/27/2016, 1:06 PM   Pager: [phone]

## 2016-10-25 NOTE — Progress Notes (Signed)
  Echocardiogram 2D Echocardiogram has been performed.  Delcie Roch 10/25/2016, 10:53 AM

## 2016-10-25 NOTE — Care Management Note (Signed)
Case Management Note Donn Pierini RN, BSN Unit 2W-Case Manager [phone]  Patient Details  Name: Elkin Belfield MRN: [id number] Date of Birth: 1996/06/06  Subjective/Objective:  Pt admitted with positive HIV antibody test - acute                 Action/Plan:  PTA pt lived at home- per MD notes has hx of cognitive delay and memory issues-  Referral for insurance issues- pt has coverage showing under [facility]- spoke with pt,  he states that he is not aware of any insurance issues and reports no difficulty getting medications when needed- Plan for f/u with ID and [facility]. Pt is also followed by [facility].   Expected Discharge Date:    10/25/16              Expected Discharge Plan:  Home/Self Care  In-House Referral:     Discharge planning Services  CM Consult  Post Acute Care Choice:  NA Choice offered to:  NA  DME Arranged:    DME Agency:     HH Arranged:    HH Agency:     Status of Service:  In process, will continue to follow  If discussed at Long Length of Stay Meetings, dates discussed:    Additional Comments:  Darrold Span, RN 10/25/2016, 11:48 AM

## 2016-10-25 NOTE — Progress Notes (Signed)
Rx sent to [facility]. 

## 2016-10-25 NOTE — Consult Note (Signed)
Date of Admission:  10/23/2016  Date of Consult:  10/25/2016  Reason for Consult: HIV disease (likely acute) Referring Physician: "Terrilyn Saver auto consult" and Dr. Beryle Beams   HPI: Micheal Leonard is an 21 y.o. male w history of hereditary tricuspid atresia.sp cardiac surgery and repeat surgery. He had been evaluated this July for possible PE, but also had mono like symptoms and had + EBV serologies at that time though VCapside IgG was also positive. He tested negative for HIV by 4th generation here at [facility] on 01/28/17  prior to transfer to [facility] where he was again though no HIV quant RNA was done. He now presents with a three-week history of pharyngitis and odynophagia.  Initially evaluated in the emergency department on March 27.  Throat culture negative for Streptococcus.  No antibiotics prescribed.  Throat pain and odynophagia have persisted.  He does not report any fevers.  He lives with his mother.  Temperature was not taken.  Repeat cultures taken for Streptococcus.  Rapid strep screen was negative. He still had significant sore throat in ED with odynophagia. He was admitted for further workup. EBV titers done again c/w. Cultures were negative his exam was pertinent for significant tonsillitis. He underwent a CT scan of his neck  asymmetrically enlarged RIGHT palatine tonsil and suggestion by imaging of possible ulceration.  In the interim last night his HIV test came back positive on 4th generation assay. He has cognitive delay so my conversation from this am apparently was not remembered by him. He did admit to having sex with other men and denied IVDU.    Past Medical History:  Diagnosis Date  . Cardiac anomaly, congenital   . EBV infection    hx/notes 10/23/2016  . Hemoptysis 10/23/2016   [name] 10/23/2016  . Hypertension   . Migraine    "a few/year now; frequency is less than in the past" (10/23/2016)  . Pharyngitis 10/23/2016   notes 10/23/2016  . Pulmonary embolism ([place])  01/29/2016  . S/P bidirectional Glenn shunt    hx/notes 10/23/2016  . S/P Fontan procedure    hx/notes 10/23/2016  . Tricuspid atresia and stenosis, congenital     Past Surgical History:  Procedure Laterality Date  . CARDIAC CATHETERIZATION  01/2016   at [facility]; hx/notes 10/23/2016  . CARDIAC SURGERY  01/1997; 2000   fontane procedure at [facility] as a child     Social History:  reports that he has never smoked. He has never used smokeless tobacco. He reports that he does not drink alcohol or use drugs.   Family History  Problem Relation Age of Onset  . Hypertension Father     No Known Allergies   Medications: I have reviewed patients current medications as documented in Epic Anti-infectives    Start     Dose/Rate Route Frequency Ordered Stop   10/25/16 1000  dolutegravir (TIVICAY) tablet 50 mg     50 mg Oral Daily 10/25/16 0843     10/25/16 1000  emtricitabine-tenofovir AF (DESCOVY) 200-25 MG per tablet 1 tablet     1 tablet Oral Daily 10/25/16 0843           ROS: as in HPI otherwise remainder of 12 point Review of Systems is negative but not reliable based on cognition  Blood pressure 123/76, pulse 89, temperature 99 F (37.2 C), temperature source Oral, resp. rate 18, height 6' (1.829 m), weight 153 lb 3.5 oz (69.5 kg), SpO2 99 %. General: Alert and awake,  oriented x3, not in any acute distress. HEENT: anicteric sclera,  EOMI, oropharynx clear and without exudate but prominent tonsils with erythema Cardiovascular: Tachycardic rate rate, normal r,  no murmur rubs or gallops Pulmonary: clear to auscultation bilaterally, no wheezing, rales or rhonchi Gastrointestinal: soft nontender, nondistended, normal bowel sounds, Musculoskeletal: no  clubbing or edema noted bilaterally Lymphatics has some cervical lymphadenopathy that is tender to palpation Skin, soft tissue: no rashes Neuro: nonfocal, strength and sensation intact   Results for orders placed or performed during the  hospital encounter of 10/23/16 (from the past 48 hour(s))  HIV antibody     Status: Abnormal   Collection Time: 10/23/16 11:47 AM  Result Value Ref Range   HIV Screen 4th Generation wRfx Reactive (A) Non Reactive    Comment: (NOTE) See additional algorithm testing elsewhere in this report. Performed At: [facility] [place], [place] [id number] Lindon Romp MD [id number]   Epstein-Barr virus VCA, IgG     Status: Abnormal   Collection Time: 10/23/16 11:47 AM  Result Value Ref Range   EBV VCA IgG >600.0 (H) 0.0 - 17.9 U/mL    Comment: (NOTE)                                 Negative        <18.0                                 Equivocal 18.0 - 21.9                                 Positive        >21.9 Performed At: [facility] [place], [place] [id number] Lindon Romp MD GY:[id number]   Epstein-Barr virus VCA, IgM     Status: None   Collection Time: 10/23/16 11:47 AM  Result Value Ref Range   EBV VCA IgM <36.0 0.0 - 35.9 U/mL    Comment: (NOTE)                                 Negative        <36.0                                 Equivocal 36.0 - 43.9                                 Positive        >43.9 REPORT EMAILED @1146  10/24/16 Performed At: [facility] [place], [place] [id number] Lindon Romp MD [id number]   CMV IgM     Status: None   Collection Time: 10/23/16 11:47 AM  Result Value Ref Range   CMV IgM <30.0 0.0 - 29.9 AU/mL    Comment: (NOTE)                                Negative         <30.0  Equivocal  30.0 - 34.9                                Positive         >34.9 A positive result is generally indicative of acute infection, reactivation or persistent IgM production. Performed At: [facility] [place], [place] [id number] Lindon Romp MD [id number]   HIV 1/2 Ab Differentiation     Status: Abnormal   Collection  Time: 10/23/16 11:47 AM  Result Value Ref Range   HIV 1 Ab Positive (A) Negative    Comment:                   Client Requested Flag   HIV 2 Ab Indeterminate Negative   Note SEE COMMENT     Comment: (NOTE) RESULT:HIV-1 Positive REPORT EMAILED @1146  10/24/16 Performed At: [facility] [place], [place] [id number] Lindon Romp MD YB:[id number]   Rapid strep screen (not at [facility])     Status: None   Collection Time: 10/23/16 12:31 PM  Result Value Ref Range   Streptococcus, Group A Screen (Direct) NEGATIVE NEGATIVE    Comment: (NOTE) A Rapid Antigen test may result negative if the antigen level in the sample is below the detection level of this test. The FDA has not cleared this test as a stand-alone test therefore the rapid antigen negative result has reflexed to a Group A Strep culture.   Culture, group A strep     Status: None (Preliminary result)   Collection Time: 10/23/16 12:31 PM  Result Value Ref Range   Specimen Description THROAT    Special Requests NONE Reflexed from [id number]    Culture CULTURE REINCUBATED FOR BETTER GROWTH    Report Status PENDING   Basic metabolic panel     Status: Abnormal   Collection Time: 10/24/16  2:44 AM  Result Value Ref Range   Sodium 136 135 - 145 mmol/L   Potassium 4.2 3.5 - 5.1 mmol/L   Chloride 106 101 - 111 mmol/L   CO2 21 (L) 22 - 32 mmol/L   Glucose, Bld 87 65 - 99 mg/dL   BUN 6 6 - 20 mg/dL   Creatinine, Ser 0.74 0.61 - 1.24 mg/dL   Calcium 8.5 (L) 8.9 - 10.3 mg/dL   GFR calc non Af Amer >60 >60 mL/min   GFR calc Af Amer >60 >60 mL/min    Comment: (NOTE) The eGFR has been calculated using the CKD EPI equation. This calculation has not been validated in all clinical situations. eGFR's persistently <60 mL/min signify possible Chronic Kidney Disease.    Anion gap 9 5 - 15  CBC     Status: None   Collection Time: 10/24/16  2:44 AM  Result Value Ref Range   WBC 4.2 4.0 - 10.5 K/uL   RBC 4.53 4.22 - 5.81  MIL/uL   Hemoglobin 13.6 13.0 - 17.0 g/dL   HCT 40.8 39.0 - 52.0 %   MCV 90.1 78.0 - 100.0 fL   MCH 30.0 26.0 - 34.0 pg   MCHC 33.3 30.0 - 36.0 g/dL   RDW 13.9 11.5 - 15.5 %   Platelets 243 150 - 400 K/uL   @BRIEFLABTABLE (sdes,specrequest,cult,reptstatus)   ) Recent Results (from the past 720 hour(s))  Rapid strep screen     Status: None   Collection Time: 10/08/16 11:15 PM  Result Value Ref Range Status  Streptococcus, Group A Screen (Direct) NEGATIVE NEGATIVE Final    Comment: (NOTE) A Rapid Antigen test may result negative if the antigen level in the sample is below the detection level of this test. The FDA has not cleared this test as a stand-alone test therefore the rapid antigen negative result has reflexed to a Group A Strep culture.   Culture, group A strep     Status: None   Collection Time: 10/08/16 11:15 PM  Result Value Ref Range Status   Specimen Description THROAT  Final   Special Requests NONE Reflexed from [id number]  Final   Culture NO GROUP A STREP (S.PYOGENES) ISOLATED  Final   Report Status 10/11/2016 FINAL  Final  Rapid strep screen (not at [facility])     Status: None   Collection Time: 10/23/16 12:31 PM  Result Value Ref Range Status   Streptococcus, Group A Screen (Direct) NEGATIVE NEGATIVE Final    Comment: (NOTE) A Rapid Antigen test may result negative if the antigen level in the sample is below the detection level of this test. The FDA has not cleared this test as a stand-alone test therefore the rapid antigen negative result has reflexed to a Group A Strep culture.   Culture, group A strep     Status: None (Preliminary result)   Collection Time: 10/23/16 12:31 PM  Result Value Ref Range Status   Specimen Description THROAT  Final   Special Requests NONE Reflexed from [id number]  Final   Culture CULTURE REINCUBATED FOR BETTER GROWTH  Final   Report Status PENDING  Incomplete     Impression/Recommendation  Principal Problem:   Dehydration Active  Problems:   Pharyngitis   S/P Fontan procedure   S/P bidirectional Glenn shunt   Tricuspid atresia with normal great arteries   Proteinuria   Sore throat   Pulmonary embolism (HCC)   Acute HIV infection syndrome (HCC)   Micheal Leonard is a 21 y.o. 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  #1 Acute HIV disease:  I have sent a viral load reflexed a genotype a CD4 count and HLA B5701 hepatitis serologies. He has hepatitis surface antigen negative. RPR is negative  I have started him on TIVICAY and disc has an inpatient and medications were filled at [facility] for 1 month supply and brought to the bedside by Magda Kiel  He gave go ahead for me to discuss diagnosis with his mother which I did. I talked her over the phone and again conveyed Q messages that his HIV would be easily controllable with him taking the medications religiously on a daily basis. That within a few months we would render his viral load undetectable and ensure preservation of immune health. As emphasized that he should have a long life in terms of an essential normal life expectancy and also normal quality of life.  I do some mention about him having difficulty with compliance with his anticoagulation so will be very critical to follow him closely in our clinic and I'll arrange for a new seen by ID pharmacy within the next 1-2 weeks and also by myself.  We'll likely simple find to a single tablet regimens such as TRIUMEQ or BIKTARVY  #2 TOnsillitis and pharyngitis: Likely due to HIV itself but also recommended checking for gonorrhea and chlamydia in the oropharynx as well as thorough STI testing with gonorrhea chlamydia testing from urine and rectum.  I spent greater than  80 minutes with the patient including greater than 50% of time in face to face counsel  of the patient guarding his HIV disease, nature of HIV and affect on  immune system, antiretroviral medications need for high adherence to the medications, cause of his pharyngitis and in coordination of his  care.    10/25/2016, 8:53 AM   Thank you so much for this interesting consult  [place] [facility] [place] [phone] (pager) [phone] (office) 10/25/2016, 8:53 AM  Rhina Brackett [place] 10/25/2016, 8:53 AM

## 2016-10-25 NOTE — ED Notes (Signed)
[place] [address] [employer], Elfredia Nevins, called to verify abnormal Lab results

## 2016-10-25 NOTE — Progress Notes (Signed)
Sending Rx to [facility]

## 2016-10-25 NOTE — Progress Notes (Signed)
Delivered Tivicay and Descovy to patient to start upon discharge. Provided education at bedside. Patient will follow up in [facility] after discharge.  Alfredo Bach, Cleotis Nipper, PharmD Clinical Pharmacy Resident [phone] (Pager) 10/25/2016 4:04 PM

## 2016-10-25 NOTE — Telephone Encounter (Signed)
Needs TOC Discharge date 10/25/16 HFU 11/01/16

## 2016-10-25 NOTE — Progress Notes (Signed)
Patient ID: Micheal Leonard, male   DOB: [birth year], 21 y.o.   MRN: [id number] CT scan reviewed, no evidence of abscess. Based on history this is not likely to be neoplastic. Continue supportive treatment until he is able to eat and drink adequately. Follow-up with [place] as an outpatient.

## 2016-10-25 NOTE — Progress Notes (Signed)
Subjective: Patient is doing well this morning. He is tolerating liquids and some soft solid food well without pain. Inquiring about discharge. ID is now following for positive HIV antibody test. Confirmed with mother today that patient does have cognitive delay and memory issues. With the patient's permission, the mother was informed of his HIV status. Plan per ID is to start [place] and Descovy for now. Once medication is delivered to room and all tests are collected, we can discharge with plans for close ID follow up.   Objective:  Vital signs in last 24 hours: Vitals:   10/24/16 0428 10/24/16 1358 10/24/16 2059 10/25/16 0340  BP:  124/80 125/63 123/76  Pulse:  98 95 89  Resp:  18 18 18   Temp:   98.8 F (37.1 C) 99 F (37.2 C)  TempSrc:   Oral Oral  SpO2:  98% 99% 99%  Weight: 153 lb 6.4 oz (69.6 kg)   153 lb 3.5 oz (69.5 kg)  Height:       Physical Exam Constitutional: NAD, appears comfortable HEENT: Posterior pharynx with erythema and mild petechia, bilateral tonsillar enlargement (R>L). Very small, pea sized submental lymph node. No other lymphadenopathy appreciated on exam.  Neck: Supple, trachea midline.  Cardiovascular: RRR, no murmurs, rubs, or gallops.  Pulmonary/Chest: CTAB, no wheezes, rales, or rhonchi.  Abdominal: Soft, non tender, non distended. +BS.  Extremities: Warm and well perfused. No edema.  Neurological: A&Ox3, CN II - XII grossly intact.   Assessment/Plan:  Mr. Caspers is a 21 yo M with pmhx of tricuspid atresia, small VSD (s/p surgical repair with [facility] operation and Fontan surgery in 2002) who presents to the ED for the second time with dehydration and complaints of sore throat, malaise, and odynophagia - found to have reactive HIV antibody.   Acute HIV: Patient presented with a 3 week history of sore throat, malaise, and odynophagia. ENT was consulted for tonsillitis. CT neck was negative for abscess. HIV antibody resulted positive (last checked in  July 2017 and negative at that time). ID is now following. HIV ulrtraquant RNA with reflex to genotype, CD4, hep surface ag, HLA b701, and RPR pending.  -- Oropharyngeal, urine, and rectal swab for GC  -- Plan to start Tivicay and Descovy per ID  -- Once all tests are collected and meds are delivered, will consider discharging patient with plans for close ID follow up and follow up in [facility]   Hx of EBV: Patient had extensive work up in July of 2017 after presenting to [facility] ED with complain of abdominal pain and SOB. There was some initial concern for PE and he was transferred to [facility] for further management. PE was ultimately ruled out. Due to his congenital heart disease and surgical repair, he has mixing of venous and arterial blood in his pulmonary arteries that make CTA interpretation very difficult. He was however noted to have extensive lymphadenopathy (mesorectal, IMA, and inguinal lymph nodes) for which he under went EGD and colonoscopy. These were overall normal aside from erythematous mucosa 5cm proximal to the anus with evidence of prolapse. During this work up, HIV was negative but EBV serologies resulted positive. He was discharged with supportive measures for EBV and given miralax and steroid suppositories for his prolapse.   Proteinuria: Patient follows with [facility] for proteinuria and chronic tea-colored urine that were found during his hospitalization in July of 2017. Extensive work up per chart review was negative. C3 and C4 compliment were normal and  there was no evidence to suggest active immune complex glomerulonephritis. He followed up with nephrology 3 months after his prior discharge and repeat protein/creatinine ration at that time was normal. This admission, he continues to have chronic tea-colored urine. UA was notable for proteinuria and elevated specific gravity (negative hgb, bilirubin, bacteria). Repeat protein/creatinine ratio this admission is again  elevated. Unclear cause for patient's ongoing proteinuria.  -- 24 hour urine protein pending   Hx of Tricuspid Atresia, SVD, with normal great vessels: S/p surgical repair in 2002 with [facility] operation and subsequent Fontan surgery. He takes daily aspirin to prevent Fontan thrombosis.  -- Continue ASA 81 daily   FEN: No fluids, replete lytes prn, advance diet as tolerated  VTE ppx: Lovenox  Code Status: FULL   Dispo: Anticipated discharge in approximately 1-2 day(s).   Velna Ochs, MD 10/25/2016, 11:16 AM Pager: [phone]

## 2016-10-26 LAB — HEPATITIS B SURFACE ANTIBODY, QUANTITATIVE: Hep B S AB Quant (Post): 5.8 m[IU]/mL — ABNORMAL LOW (ref >9.9)

## 2016-10-26 LAB — HEPATITIS A ANTIBODY, TOTAL: HEP A TOTAL AB: POSITIVE — AB

## 2016-10-26 LAB — HEPATITIS B SURFACE ANTIGEN: Hepatitis B Surface Ag: NEGATIVE

## 2016-10-27 LAB — HUMAN PARVOVIRUS DNA DETECTION BY PCR: PARVOVIRUS B19 PCR: NEGATIVE

## 2016-10-28 LAB — GC/CHLAMYDIA PROBE AMP (~~LOC~~) NOT AT ARMC
CHLAMYDIA, DNA PROBE: POSITIVE — AB
CHLAMYDIA, DNA PROBE: NEGATIVE
Chlamydia: NEGATIVE
NEISSERIA GONORRHEA: NEGATIVE
NEISSERIA GONORRHEA: NEGATIVE
NEISSERIA GONORRHEA: NEGATIVE

## 2016-10-30 LAB — HLA B*5701: HLA B 5701: NEGATIVE

## 2016-10-31 ENCOUNTER — Telehealth: Payer: Self-pay | Admitting: *Deleted

## 2016-10-31 ENCOUNTER — Other Ambulatory Visit: Payer: Self-pay | Admitting: Oncology

## 2016-10-31 DIAGNOSIS — B20 Human immunodeficiency virus [HIV] disease: Secondary | ICD-10-CM

## 2016-10-31 LAB — HIV-1 RNA ULTRAQUANT REFLEX TO GENTYP+
HIV-1 RNA BY PCR: 548000 {copies}/mL
HIV-1 RNA Quant, Log: 5.739 log10copy/mL

## 2016-10-31 LAB — REFLEX TO GENOSURE(R) MG

## 2016-10-31 NOTE — Telephone Encounter (Signed)
RN received a referral from Dr [name] to assess possible barriers to care. Patient received a new diagnosis and understandably it can be overwhelming. RN contacted the patient's mother and explained to her my position at the clinic and that I would love to sit down with her to discuss any frustrations, concerns or misconceptions related to her son's HIV status. RN explained that I may be able to link him to additional resources if needed. Micheal Leonard stated its difficult for her since she has to get off work for all of his appt and she really wants to see her son healthy. We agreed to meet after his appt tomorrow since [facility] will already be off work. Together we confirmed the time and day of Micheal Leonard's upcoming appts

## 2016-11-01 ENCOUNTER — Ambulatory Visit: Payer: Self-pay | Admitting: *Deleted

## 2016-11-01 ENCOUNTER — Ambulatory Visit (INDEPENDENT_AMBULATORY_CARE_PROVIDER_SITE_OTHER): Payer: Commercial Managed Care - HMO | Admitting: Internal Medicine

## 2016-11-01 ENCOUNTER — Encounter: Payer: Self-pay | Admitting: Internal Medicine

## 2016-11-01 ENCOUNTER — Ambulatory Visit (HOSPITAL_COMMUNITY)
Admission: RE | Admit: 2016-11-01 | Discharge: 2016-11-01 | Disposition: A | Discharge status: Home / Self Care | Payer: Commercial Managed Care - HMO | Source: Ambulatory Visit | Attending: Oncology | Admitting: Oncology

## 2016-11-01 DIAGNOSIS — Z9114 Patient's other noncompliance with medication regimen: Secondary | ICD-10-CM

## 2016-11-01 DIAGNOSIS — Z21 Asymptomatic human immunodeficiency virus [HIV] infection status: Secondary | ICD-10-CM

## 2016-11-01 DIAGNOSIS — R000 Tachycardia, unspecified: Secondary | ICD-10-CM | POA: Diagnosis not present

## 2016-11-01 DIAGNOSIS — F88 Other disorders of psychological development: Secondary | ICD-10-CM | POA: Diagnosis not present

## 2016-11-01 DIAGNOSIS — Z7982 Long term (current) use of aspirin: Secondary | ICD-10-CM | POA: Diagnosis not present

## 2016-11-01 DIAGNOSIS — Q224 Congenital tricuspid stenosis: Secondary | ICD-10-CM | POA: Diagnosis not present

## 2016-11-01 DIAGNOSIS — K068 Other specified disorders of gingiva and edentulous alveolar ridge: Secondary | ICD-10-CM

## 2016-11-01 DIAGNOSIS — B20 Human immunodeficiency virus [HIV] disease: Secondary | ICD-10-CM

## 2016-11-01 DIAGNOSIS — Z8619 Personal history of other infectious and parasitic diseases: Secondary | ICD-10-CM

## 2016-11-01 DIAGNOSIS — K0889 Other specified disorders of teeth and supporting structures: Secondary | ICD-10-CM | POA: Diagnosis not present

## 2016-11-01 DIAGNOSIS — Z7252 High risk homosexual behavior: Secondary | ICD-10-CM | POA: Diagnosis not present

## 2016-11-01 DIAGNOSIS — A564 Chlamydial infection of pharynx: Secondary | ICD-10-CM

## 2016-11-01 MED ORDER — AZITHROMYCIN 500 MG PO TABS: 1000.0000 mg | ORAL_TABLET | Freq: Every day | ORAL | 0 refills | Status: DC

## 2016-11-01 NOTE — Progress Notes (Signed)
   CC: Hospital follow up for newly diagnosed HIV  HPI:  [name] is a 21 y.o. man with PMHx as noted below who presents today for hospital follow up for his new HIV diagnosis. Patient is accompanied by his mother to the visit.  HIV: Patient was hospitalized from 4/11-4/13 after presenting with a 3 week hx of pharyngitis and odynophagia. He was screened for HIV as part of the work up and found to be HIV antibody positive. This reflexed with an RNA quant of 548,000 copies. His CD4 count was found to be 180. He was seen by ID in the hospital and recommended to start Tivicay and Descovy. Patient and mother report he has been taking these medications every day. When the patient's mother was not present in the room, the patient expressed that he is sexually active with men. He reports his last sexual activity was in February this year. He states he did not feel he was pressured into this sexual activity and that he consented with this partner. Patient's mother expressed concern if she needed to take PrEP therapy as she had cleaned up blood that he had been spitting out at home. She denies any cuts on her hands or being exposed to a significant amount of blood.   Chlamydia Pharyngitis: As described above, patient had presented with a 3 week hx of pharyngitis and odynophagia. He was found to be strep negative. After his HIV diagnosis, he was also screened for gonorrhea and chlamydia. Urine and oral screenings were negative but anal swab was positive for chlamydia. Patient reports he is no longer having a sore throat and that he is eating/drinking normally without issues.   Tricuspid Atresia: History of tricuspid atresia with normal great arteries, small VSD s/p initial [name] operation and subsequent Fontan surgery on 09/11/2000. Patient was being followed at [facility] but then has been lost to follow up. He had not been compliant with aspirin therapy. He reports since hospital discharge he has been taking  aspirin 81 mg daily.   Gum Bleeding: Patient reporting he is having gum bleeding. He states this has been occurring since he was hospitalized. He cannot remember the last time he saw a dentist. He reports brushing his teeth daily.   Past Medical History:  Diagnosis Date  . Cardiac anomaly, congenital   . EBV infection    hx/notes 10/23/2016  . Hemoptysis 10/23/2016   [name] 10/23/2016  . Hypertension   . Migraine    "a few/year now; frequency is less than in the past" (10/23/2016)  . Pharyngitis 10/23/2016   notes 10/23/2016  . Pulmonary embolism (HCC) 01/29/2016  . S/P bidirectional Glenn shunt    hx/notes 10/23/2016  . S/P Fontan procedure    hx/notes 10/23/2016  . Tricuspid atresia and stenosis, congenital     Review of Systems:   All negative except per HPI  Physical Exam:  Vitals:   11/01/16 1533  Height: 6' (1.829 m)   HR 128  General: Thin young man in NAD HEENT: EOMI, sclera anicteric, posterior pharynx erythematous with enlargement of tonsils, gum bleeding noted on lower teeth, poor dentition, mucus membranes moist CV: tachycardic in 120s, no m/g/r Pulm: CTA bilaterally, breaths non-labored Ext: warm, no peripheral edema Psych: flat affect, acts younger than age   Assessment & Plan:   See Encounters Tab for problem based charting.  Patient discussed with Dr. Cyndie Chime

## 2016-11-01 NOTE — Patient Instructions (Signed)
General Instructions: - Take Azithromycin 1000 mg once - Start aspirin 81 mg daily - Appointment with Dr. [name] on 4/30 - Please make appointment with [facility] as soon as possible - Make appointment with a dentist for teeth cleaning   Please bring your medicines with you each time you come to clinic.  Medicines may include prescription medications, over-the-counter medications, herbal remedies, eye drops, vitamins, or other pills.   Progress Toward Treatment Goals:  No flowsheet data found.  Self Care Goals & Plans:  No flowsheet data found.  No flowsheet data found.   Care Management & Community Referrals:  No flowsheet data found.

## 2016-11-02 DIAGNOSIS — R000 Tachycardia, unspecified: Secondary | ICD-10-CM | POA: Insufficient documentation

## 2016-11-02 DIAGNOSIS — K068 Other specified disorders of gingiva and edentulous alveolar ridge: Secondary | ICD-10-CM | POA: Insufficient documentation

## 2016-11-02 NOTE — Assessment & Plan Note (Signed)
Patient advised to continue aspirin 81 mg daily. Discussed with patient and mother the importance of him reestablishing with [facility] given his significant history. Will place referral as patient's mother cannot remember name of Cardiologist that patient had followed with.

## 2016-11-02 NOTE — Assessment & Plan Note (Signed)
Patient had gum bleeding on exam and apparently was having this prior to hospitalization. He has poor dentition on exam. His platelet level was normal in the hospital at 249,000. Recommended that he follow up with a dentist as soon as possible for teeth cleaning and x-rays.
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
## 2016-11-02 NOTE — Assessment & Plan Note (Signed)
Despite throat swab being negative for chlamydia, his pharyngitis is most likely due to chlamydia infection given it has persisted, was strep negative, and has been unresponsive to other treatments. Patient was given a prescription for Azithromycin 1000 mg x 1 dose.

## 2016-11-02 NOTE — Assessment & Plan Note (Signed)
Patient noted to be tachycardic on exam today in the 120s. He was in no apparent distress and BP stable. EKG performed in the office showed sinus tachycardia with LVH, no evidence of ischemia. Patient denied feeling anxious. He was tachycardic in the hospital as well. He may require a beta blocker to prevent tachycardia-induced cardiomyopathy. He is being referred to Cardiology at [facility] who will hopefully address this issue.

## 2016-11-04 ENCOUNTER — Telehealth: Payer: Self-pay | Admitting: *Deleted

## 2016-11-04 NOTE — Telephone Encounter (Signed)
PER REQUEST OF DR RIVET/ CALLED MS. Ojeda TO LET HER KNOW THAT SHE CAN CALL [facility] OFFICE [phone]) IF Quy NEED TO BE SEEN. BEST TO CONTINUE HIS CARE THERE SINCE THEY ARE FAMILIAR WITH PATIENT. IF SHE HAS ANY QUESTIONS ,SHE CAN CALL THE CLINIC AT [phone].

## 2016-11-04 NOTE — Progress Notes (Signed)
Medicine attending: Medical history, presenting problems, physical findings, and medications, reviewed with resident physician Dr Rich Number on the day of the patient visit and I concur with her evaluation and management plan. Patient known to me from recent hospitalization. 21 y/o S/P surgery as an infant for tricuspid atresia. Some signs of developmental/cognitive delay. He presented with a persistent 3 week hx of Strep negative pharyngitis as first manifestation of HIV diease. He also had a positive rectal swab for Chylamidia. He was seen by our infectious disease consultant and started on HARRT therapy & will also get Azithromycin for the Chylamidia. He lives with his mother who is a single parent & who is very attached to him.  He is stable at this time and will continue ongoing follow up with [place] and ID.

## 2016-11-05 ENCOUNTER — Ambulatory Visit: Payer: Self-pay | Admitting: *Deleted

## 2016-11-05 DIAGNOSIS — Z21 Asymptomatic human immunodeficiency virus [HIV] infection status: Secondary | ICD-10-CM

## 2016-11-06 ENCOUNTER — Encounter: Payer: Self-pay | Admitting: *Deleted

## 2016-11-11 ENCOUNTER — Encounter: Payer: Self-pay | Admitting: Infectious Disease

## 2016-11-11 ENCOUNTER — Ambulatory Visit (INDEPENDENT_AMBULATORY_CARE_PROVIDER_SITE_OTHER): Payer: Commercial Managed Care - HMO | Admitting: Infectious Disease

## 2016-11-11 VITALS — BP 129/88 | HR 103 | Temp 98.6°F | Ht 72.0 in | Wt 156.0 lb

## 2016-11-11 DIAGNOSIS — Q224 Congenital tricuspid stenosis: Secondary | ICD-10-CM | POA: Diagnosis not present

## 2016-11-11 DIAGNOSIS — B20 Human immunodeficiency virus [HIV] disease: Secondary | ICD-10-CM

## 2016-11-11 DIAGNOSIS — A564 Chlamydial infection of pharynx: Secondary | ICD-10-CM | POA: Diagnosis not present

## 2016-11-11 DIAGNOSIS — Z9889 Other specified postprocedural states: Secondary | ICD-10-CM

## 2016-11-11 MED ORDER — BICTEGRAVIR-EMTRICITAB-TENOFOV 50-200-25 MG PO TABS: 1.0000 | ORAL_TABLET | Freq: Every day | ORAL | 6 refills | Status: DC

## 2016-11-11 NOTE — Progress Notes (Signed)
HPI: Micheal Leonard is a 21 y.o. male who presents to the [facility] today to follow-up with Dr. Daiva Leonard.  He was recently seen in the hospital with newly diagnosed HIV.   Allergies: No Known Allergies  Past Medical History: Past Medical History:  Diagnosis Date  . Cardiac anomaly, congenital   . EBV infection    hx/notes 10/23/2016  . Hemoptysis 10/23/2016   Micheal Leonard 10/23/2016  . Hypertension   . Migraine    "a few/year now; frequency is less than in the past" (10/23/2016)  . Pharyngitis 10/23/2016   notes 10/23/2016  . Pulmonary embolism (HCC) 01/29/2016  . S/P bidirectional Glenn shunt    hx/notes 10/23/2016  . S/P Fontan procedure    hx/notes 10/23/2016  . Tricuspid atresia and stenosis, congenital     Social History: Social History   Social History  . Marital status: Single    Spouse name: N/A  . Number of children: N/A  . Years of education: N/A   Social History Main Topics  . Smoking status: Never Smoker  . Smokeless tobacco: Never Used     Comment: declined condoms  . Alcohol use No  . Drug use: No  . Sexual activity: Yes   Other Topics Concern  . None   Social History Narrative  . None    Current Regimen: Tivicay + Descovy  Labs: Hepatitis B Surface Ag (no units)  Date Value  10/25/2016 Negative   HCV Ab (s/co ratio)  Date Value  01/29/2016 0.1    CrCl: Estimated Creatinine Clearance: 148.7 mL/min (by C-G formula based on SCr of 0.74 mg/dL).  Lipids: No results found for: CHOL, TRIG, HDL, CHOLHDL, VLDL, LDLCALC  Assessment: Micheal Leonard is here today to follow-up/establish care with Dr. Daiva Leonard for his newly diagnosed HIV infection.  He was recently started on Tivicay and Descovy, filled at [facility]. I brought him his first fill when he was hospitalized.  He comes in today with his mom.  He tells [place] that he has not missed any doses since starting the medications and he takes them every morning at 11am. He is interested in switching to [place] to  simplify his regimen.  I sent it over to [facility] and Micheal Leonard is currently working on the prior [place]. He wants it mailed. He will come back and see me in 1 month.   Plans: - Continue Tivicay and Descovy for now - PA for Biktarvy and switch if approved - Mail from [facility] - F/u with me 5/31 at 9am  Micheal Leonard, PharmD, CPP Infectious Diseases Clinical Pharmacist [facility] 11/11/2016, 4:12 PM

## 2016-11-11 NOTE — Progress Notes (Signed)
Subjective:   Chief complaint: follow-up for acute HIV   Patient ID: Micheal Leonard, male    DOB: [birth year], 21 y.o.   MRN: [id number]  HPI  21 year old with hx of cognitive delay, congenital heart disease sp fontane procedure at [facility] now admitted with acute HIV syndrome with cervical LA, fevers, and pharyngitis. He had workp that included HIV which was + with VL of half a million. CD4 was 180 but I suspect acutely suppressed in context of his infection as he had tested negative for HIV in the summer.   He was found to have rectal chlamydia and he and Mom report he did take his azithromycin several weeks ago when seen by Dr. Beckie Salts and Dr. Cyndie Chime in [facility].   Unfortunately genotype was NOT done on serum from hospital due to inadequate collection. He has tolerated TIVICAY and Descovy without difficulty but would like to go to STR.     Past Medical History:  Diagnosis Date  . Cardiac anomaly, congenital   . EBV infection    hx/notes 10/23/2016  . Hemoptysis 10/23/2016   [name] 10/23/2016  . Hypertension   . Migraine    "a few/year now; frequency is less than in the past" (10/23/2016)  . Pharyngitis 10/23/2016   notes 10/23/2016  . Pulmonary embolism (HCC) 01/29/2016  . S/P bidirectional Glenn shunt    hx/notes 10/23/2016  . S/P Fontan procedure    hx/notes 10/23/2016  . Tricuspid atresia and stenosis, congenital     Past Surgical History:  Procedure Laterality Date  . CARDIAC CATHETERIZATION  01/2016   at [facility]; hx/notes 10/23/2016  . CARDIAC SURGERY  01/1997; 2000   fontane procedure at [facility] as a child     Family History  Problem Relation Age of Onset  . Hypertension Father       Social History   Social History  . Marital status: Single    Spouse name: N/A  . Number of children: N/A  . Years of education: N/A   Social History Main Topics  . Smoking status: Never Smoker  . Smokeless tobacco: Never Used     Comment: declined condoms  . Alcohol use No  .  Drug use: No  . Sexual activity: Yes   Other Topics Concern  . None   Social History Narrative  . None    No Known Allergies   Current Outpatient Prescriptions:  .  aspirin EC 81 MG EC tablet, Take 1 tablet (81 mg total) by mouth daily., Disp: 30 tablet, Rfl: 5 .  dolutegravir (TIVICAY) 50 MG tablet, Take 1 tablet (50 mg total) by mouth daily., Disp: 30 tablet, Rfl: 5 .  emtricitabine-tenofovir AF (DESCOVY) 200-25 MG tablet, Take 1 tablet by mouth daily., Disp: 30 tablet, Rfl: 5 .  azithromycin (ZITHROMAX) 500 MG tablet, Take 2 tablets (1,000 mg total) by mouth daily., Disp: 2 tablet, Rfl: 0 .  bictegravir-emtricitabine-tenofovir AF (BIKTARVY) 50-200-25 MG TABS tablet, Take 1 tablet by mouth daily., Disp: 30 tablet, Rfl: 6   Review of Systems  Constitutional: Negative for chills, diaphoresis and fever.  HENT: Negative for congestion, hearing loss, sore throat and tinnitus.   Eyes: Negative for photophobia.  Respiratory: Negative for cough, shortness of breath and wheezing.   Cardiovascular: Negative for chest pain, palpitations and leg swelling.  Gastrointestinal: Negative for abdominal pain, blood in stool, constipation, diarrhea, nausea and vomiting.  Genitourinary: Negative for dysuria, flank pain and hematuria.  Musculoskeletal: Negative for back pain and myalgias.  Skin: Negative for rash.  Neurological: Negative for dizziness, weakness and headaches.  Hematological: Does not bruise/bleed easily.  Psychiatric/Behavioral: Negative for suicidal ideas. The patient is not nervous/anxious.        Objective:   Physical Exam  Constitutional: He is oriented to person, place, and time. He appears well-developed and well-nourished. No distress.  HENT:  Head: Normocephalic and atraumatic.  Mouth/Throat: No oropharyngeal exudate.  Eyes: Conjunctivae and EOM are normal. No scleral icterus.  Neck: Normal range of motion. Neck supple.  Cardiovascular: Normal rate and regular  rhythm.   Pulmonary/Chest: Effort normal. No respiratory distress. He has no wheezes.  Abdominal: He exhibits no distension.  Musculoskeletal: He exhibits no edema or tenderness.  Neurological: He is alert and oriented to person, place, and time. He exhibits normal muscle tone. Coordination normal.  Skin: Skin is warm and dry. No rash noted. He is not diaphoretic. No erythema. No pallor.  Psychiatric: He has a normal mood and affect. Judgment and thought content normal. He is slowed.          Assessment & Plan:   Acute HIV disease: he would prefer STR so we will change to [facility], checking labs today including genotype, CD4  He needs several vaccines which we are starting  Congenital heart disease: continue to follow at [facility]  Cognitive delay: I am not quite sure the extent of this but at present he is seemingly reliable with Mother's coaching as far as his adherence  I spent greater than 40 minutes with the patient including greater than 50% of time in face to face counsel of the patient re HIV, ARV medications, congenital heart disease and in coordination of hiscare.

## 2016-11-12 ENCOUNTER — Telehealth: Payer: Self-pay | Admitting: Pharmacist

## 2016-11-12 ENCOUNTER — Other Ambulatory Visit: Payer: Self-pay | Admitting: Pharmacist

## 2016-11-12 LAB — T-HELPER CELL (CD4) - (RCID CLINIC ONLY)
CD4 T CELL ABS: 460 /uL (ref 400–2700)
CD4 T CELL HELPER: 24 % — AB (ref 33–55)

## 2016-11-12 NOTE — Telephone Encounter (Signed)
Patient was to switch from [place] and Descovy to [place].  Insurance required a prior Berkley Harvey which was denied even after appealed.  Called patient to let him know and told him to continue taking the Tivicay and Descovy for now.  Will try again in a few months to see if will approve.  Patient verbalized understanding. [facility] will mail Tivicay and Descovy to patient.

## 2016-11-12 NOTE — Telephone Encounter (Signed)
Perfect thanks so much Cassie! 

## 2016-11-14 ENCOUNTER — Encounter: Payer: Self-pay | Admitting: *Deleted

## 2016-11-14 LAB — REFLEX TO GENOSURE(R) MG

## 2016-11-14 LAB — HIV-1 RNA ULTRAQUANT REFLEX TO GENTYP+
HIV1 RNA # SERPL PCR: 11100 {copies}/mL
HIV1 RNA Plas PCR-Log#: 4.045 log10copy/mL

## 2016-11-15 LAB — HIV RNA, RTPCR W/R GT (RTI, PI,INT)
HIV-1 RNA, QN PCR: 1500 copies/mL — ABNORMAL HIGH
HIV-1 RNA, QN PCR: 3.18 {Log_copies}/mL — AB

## 2016-11-18 LAB — RFLX HIV-1 INTEGRASE GENOTYPE

## 2016-11-19 LAB — HIV-1 GENOTYPE: HIV-1 Genotype: DETECTED — AB

## 2016-11-21 ENCOUNTER — Telehealth: Payer: Self-pay | Admitting: *Deleted

## 2016-11-21 ENCOUNTER — Encounter: Payer: Self-pay | Admitting: Pharmacist Clinician (PhC)/ Clinical Pharmacy Specialist

## 2016-11-21 MED FILL — DESCOVY 200-25 MG TABS: 200-25 | 30 days supply | Qty: 30 | Fill #1

## 2016-11-21 MED FILL — TIVICAY 50 MG TABLET: 50 | 30 days supply | Qty: 30 | Fill #1

## 2016-11-21 NOTE — Telephone Encounter (Signed)
Patient called and left a voice mail to return call. Called back and he said he had already spoken to [name], pharmacist and his medication was sent to pharmacy. Micheal [name] CMA

## 2016-11-26 ENCOUNTER — Telehealth: Payer: Self-pay | Admitting: *Deleted

## 2016-11-26 NOTE — Progress Notes (Signed)
RN made a follow up visit with Micheal Leonard to go over again education that was provided during our last visit. Patient and Mother did not have any additional HIV questions but did have questions about the FMLA process.  RN also provided the patient with a bag of condom/lubricants during today's visit

## 2016-11-26 NOTE — Progress Notes (Signed)
RN made a home visit with Micheal Leonard and his mother. Currently the family had a visitor so HIV status and medical information was not disclosed.  Since I could not openly Micheal Leonard to the patient about his HIV we reviewed his current labs, what the lab results mean, and what is considered good and bad (HIV Load and CD4). Currently Micheal Leonard is taking all his medications without any concerns at this time. His mother and I discussed her FMLA ppk for further appointments along with her plan for [facility]. Once the visitor feel asleep RN had a more open conversation with Micheal Leonard about how to manage his HIV, further sexual contact, the need for condom use during each sexual encounter and staying in care with Dr Daiva EvesVan Dam.   Order received on 10/31/16 by Dr Ulyses JarredVan Dam/Tammy King RN to evaluate [facility]  1st contact was made on 10/31/16 and evaluation scheduled and performed on 10/2016 for [facility]. Patient was consented to care at this time.   Frequency / Duration of [facility] visits: Effective: 11/01/16: 592mo3, 341mo1, 4 PRN's for complications with disease process/progression, medication changes or concerns   [facility] will assess for learning needs related to diagnosis and treatment regimen, provide education as needed, fill pill box if needed, address any barriers which may be preventing or posing a risk to medication compliance, and communicating with care team including physician and case manager. Focus of each visit with be keeping the patient in care, medications access and assistance with removing potential or current barriers to care.  Individualized Plan Of Care 11/01/16 to 01/30/17  a. Type of service(s) and care to be delivered: RN Case Management  b. Frequency and duration of service: Effective 11/01/16: 262mo3, 161mo1, 4 prns for complications with disease process/progression, medication changes or concerns .   Visits/Contact may be  conducted telephonically or in person to best suit the patient.  c. Activity restrictions: Pt may be up as tolerated and can safely ambulate without the need for a assistive device   d. Safety Measures: Standard Precautions/Infection Control  And Disease Management  e. Service Objectives and Goals: Service Objectives are to assist the pt with HIV medication regimen adherence and staying in care with the Infectious Disease Clinic by identifying barriers to care. RN will address the barriers that are identified by the  patient. Currently the patient states he is ok with taking his current regimen and does not feel overwhelmed but the new diagnosis. The patient's mom would like to make sure she has a linkage to other community resources just in case a situation arrives she  will have a contact person to speak with and guide her. RN's focus will be on providing a linkage to care as needed.  f. Equipment required: No additional equipment needs at this time   g. Functional Limitations: Cognitive delay but functional  h. Rehabilitation potential: Guarded   i. Diet and Nutritional Needs: Regular Diet   j. Medications and treatments: Medications have been reconciled and reviewed and are a part of EPIC electronic file   k. Specific therapies if needed: RN   l. Pertinent diagnoses: New HIV disease   m. Expected outcome: Guarded

## 2016-11-26 NOTE — Telephone Encounter (Signed)
Order received on 10/31/16 by Dr Ulyses JarredVan Dam/Tammy King RN to evaluate [facility]).  1st contact was made on 10/31/16 and evaluation scheduled and performed on 10/2016 for [facility]. Patient was consented to care at this time.   Frequency / Duration of [facility] visits: Effective: 11/01/16: 762mo3, 931mo1, 4 PRN's for complications with disease process/progression, medication changes or concerns   [facility] will assess for learning needs related to diagnosis and treatment regimen, provide education as needed, fill pill box if needed, address any barriers which may be preventing or posing a risk to medication compliance, and communicating with care team including physician and case manager. Focus of each visit with be keeping the patient in care, medications access and assistance with removing potential or current barriers to care.  Individualized Plan Of Care 11/01/16 to 01/30/17  a. Type of service(s) and care to be delivered: RN Case Management  b. Frequency and duration of service: Effective 11/01/16: 502mo3, 121mo1, 4 prns for complications with disease process/progression, medication changes or concerns .   Visits/Contact may be conducted telephonically or in person to best suit the patient.  c. Activity restrictions: Pt may be up as tolerated and can safely ambulate without the need for a assistive device   d. Safety Measures: Standard Precautions/Infection Control  And Disease Management  e. Service Objectives and Goals: Service Objectives are to assist the pt with HIV medication regimen adherence and staying in care with the Infectious Disease Clinic by identifying barriers to care. RN will address the barriers that are identified by the  patient. Currently the patient states he is ok with taking his current regimen and does not feel overwhelmed but the new diagnosis. The patient's mom would like to make sure she has a linkage to other community resources just in  case a situation arrives she  will have a contact person to speak with and guide her. RN's focus will be on providing a linkage to care as needed.  f. Equipment required: No additional equipment needs at this time   g. Functional Limitations: Cognitive delay but functional  h. Rehabilitation potential: Guarded   i. Diet and Nutritional Needs: Regular Diet   j. Medications and treatments: Medications have been reconciled and reviewed and are a part of EPIC electronic file   k. Specific therapies if needed: RN   l. Pertinent diagnoses: New HIV disease   m. Expected outcome: Guarded

## 2016-11-26 NOTE — Patient Instructions (Signed)
RN met with client and or caregiver for assessment. RN reviewed Transport planner, [place], Home Safety Management Information Booklet. Home Fire Safety Assessment, Fall Risk Assessment and Suicide Risk Assessment was performed. RN also discussed information on a Living Will, Advanced Directives, and [place]. RN and Client/Designated Party educated/reviewed/signed Client Agreement and Consent for Service form along with Patient Rights and Responsibilities statement. Patient declined booklet at this time due to the importance of keeping his HIV status protected. RN developed patient specific and centered care plan. RN provided contact information and reviewed how to receive emergency help after hours for schedule changes, billing questions, reporting of safety issues, falls, concerns or any needs/questions. Standard Precaution and Infection control along with interventions to correct or prevent high risk behaviors instructed to the patient. Client/Caregiver reports understanding and agreement with the above

## 2016-11-26 NOTE — Telephone Encounter (Signed)
I agree with the plan of care as outlined in Lind CovertAmbre McNeil RN's note.

## 2016-12-05 ENCOUNTER — Other Ambulatory Visit: Payer: Self-pay | Admitting: *Deleted

## 2016-12-05 ENCOUNTER — Telehealth: Payer: Self-pay | Admitting: *Deleted

## 2016-12-05 NOTE — Telephone Encounter (Signed)
When is his next visit with me?

## 2016-12-05 NOTE — Telephone Encounter (Signed)
[name] why dont we stop his azitrhomycin that might be to blame here. If it doesn't get better then we will need to do a workup

## 2016-12-05 NOTE — Telephone Encounter (Signed)
Just spoke with the patient and he stated he actually has not been taking the zithromax. He has only been taking the descovy and tivicay

## 2016-12-05 NOTE — Telephone Encounter (Signed)
Rn contacted the patient who states all is going well. RN asked if he needed any things such as education, any concerns or additional condoms/lubs. Patient stated he does have one concern. Patient stated every since he started taking the medications he has to go to the bathroom a lot. RN informed the patient that it can be a side effect but I questioned how many bowel movements that patient is having per day. Patient stated he may have about 7 bowel movements per day. RN informed the patient that 7 bowels movements is more than expected. RN informed the patient that  I will run this by Dr Daiva EvesVan Dam but he needs to increase his fiber intake and stay hydrated in the mean time.

## 2016-12-05 NOTE — Telephone Encounter (Signed)
Contacted the patient with the order to D/C zithromax. Message left along with text message sent. Thanks Dr Daiva EvesVan Dam

## 2016-12-06 ENCOUNTER — Other Ambulatory Visit: Payer: Self-pay | Admitting: *Deleted

## 2016-12-06 NOTE — Telephone Encounter (Signed)
I looked over his chart. 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 His CD4 is completely normal because he had recent acute HIV. He should go to an urgent care and get tested for CDiff colitis and viral gastroenteritis.  He is on a very modern ARV drug which should not be causing these symptoms

## 2016-12-06 NOTE — Telephone Encounter (Signed)
He has a pharm visit 5/31 but won't see you until 09/05

## 2016-12-06 NOTE — Telephone Encounter (Signed)
I called Micheal Leonard's mom and she stated he has not mentioned anything about loose stools to her and she does not feel he is having bowel movements 7 times a day. Micheal Leonard still lives with his mom and she stated he used the bathroom once before she left for work at [employer] today. Micheal Leonard stated she will f/u with Micheal Leonard about this concern.

## 2016-12-06 NOTE — Telephone Encounter (Signed)
Maybe we can do some labs including stool sample AFB blood culture when he comes to see pharmacy? If he gets worse, I may need to see him sooner. How many BM per day?

## 2016-12-06 NOTE — Telephone Encounter (Signed)
Per the patient 7 loose stools per day. I just gave him a call now and he is pretty adiment about the amount of stools. He also confirmed that today he has already had a couple of loose stools.

## 2016-12-06 NOTE — Telephone Encounter (Signed)
Thanks [name]! 

## 2016-12-12 ENCOUNTER — Ambulatory Visit (INDEPENDENT_AMBULATORY_CARE_PROVIDER_SITE_OTHER): Payer: Commercial Managed Care - HMO | Admitting: Pharmacist

## 2016-12-12 DIAGNOSIS — Z23 Encounter for immunization: Secondary | ICD-10-CM | POA: Diagnosis not present

## 2016-12-12 DIAGNOSIS — B20 Human immunodeficiency virus [HIV] disease: Secondary | ICD-10-CM | POA: Diagnosis not present

## 2016-12-12 MED ORDER — ELVITEG-COBIC-EMTRICIT-TENOFAF 150-150-200-10 MG PO TABS: 1.0000 | ORAL_TABLET | Freq: Every day | ORAL | 5 refills | Status: DC

## 2016-12-12 MED FILL — GENVOYA TABLET: 150-150-200 | 30 days supply | Qty: 30 | Fill #0

## 2016-12-12 NOTE — Progress Notes (Signed)
HPI: Micheal Leonard is a 21 y.o. male who presents to the [facility] pharmacy clinic for HIV follow-up.   Allergies: No Known Allergies  Past Medical History: Past Medical History:  Diagnosis Date  . Cardiac anomaly, congenital   . EBV infection    hx/notes 10/23/2016  . Hemoptysis 10/23/2016   [name]/notes 10/23/2016  . Hypertension   . Migraine    "a few/year now; frequency is less than in the past" (10/23/2016)  . Pharyngitis 10/23/2016   notes 10/23/2016  . Pulmonary embolism (HCC) 01/29/2016  . S/P bidirectional Glenn shunt    hx/notes 10/23/2016  . S/P Fontan procedure    hx/notes 10/23/2016  . Tricuspid atresia and stenosis, congenital     Social History: Social History   Social History  . Marital status: Single    Spouse name: N/A  . Number of children: N/A  . Years of education: N/A   Social History Main Topics  . Smoking status: Never Smoker  . Smokeless tobacco: Never Used     Comment: declined condoms  . Alcohol use No  . Drug use: No  . Sexual activity: Yes   Other Topics Concern  . Not on file   Social History Narrative  . No narrative on file    Current Regimen: Tivicay + Descovy  Labs: CD4 T Cell Abs (/uL)  Date Value  11/11/2016 460   Hepatitis B Surface Ag (no units)  Date Value  10/25/2016 Negative   HCV Ab (s/co ratio)  Date Value  01/29/2016 0.1    CrCl: CrCl cannot be calculated (Patient's most recent lab result is older than the maximum 21 days allowed.).  Lipids: No results found for: CHOL, TRIG, HDL, CHOLHDL, VLDL, LDLCALC  Assessment: Micheal Leonard is here today for HIV follow-up. He continues on [place]iv[place]ay and Descovy.  I tried to get his insurance to approve Biktarvy but it was denied even after I appealed it. He is experiencing several side effects, most notably diarrhea.  He states it has been going on for a few weeks. He tells me that it isn't every day that he has diarrhea - sometimes it is runny and sometimes it is just a little loose.  He has not tried anything OTC for it.  Gave him instructions on how to take imodium and told him to try that.  He is also complaining of a rash on his arms.  To me, it doesn't really look like a rash, just more skin discoloration around his elbows.  He is still requesting a one pill once daily regimen.  I will [facility] him to [facility] to see if we can stop the diarrhea and rash. I also discussed Odefsey and Triumeq with him, but he would prefer the Genvoya. [facility]  I counseled him on how to take it including with food and not to miss any doses. He did state that [name] told him to stop taking his medications until he saw me again.  There is no mention of this in her notes, and I don't think she would tell him to do that.  The mother also agrees and said that [name] only told him to increase his fiber and stay hydrated.  I told him not to miss anymore doses (he just missed yesterday). I will bring him back to see Dr. Daiva EvesVan Dam earlier than his scheduled September appointment.   He wants his medications mailed to him.  I verified his address 3 times with him, as well  as with his mother.  Of note, Dr. Zenaida Niece Dam's note last time said that he required several vaccinations and they were started last time he was seen on 4/30. [name] also said he received 2-3 shots last time, but nothing is documented under immunizations from last time, so I'm not sure what he needs.  I do know he got his HPV last time, so I will give him his second dose of that.  Plans: - Stop Tivicay and Descovy - Start Genvoya 1 pill PO once daily with food - 2nd HPV vaccine - F/u with Dr. [name] 7/11 at 9:15am  Keighan Amezcua L. Moe Brier, PharmD, CPP Infectious Diseases Clinical Pharmacist [facility] 12/12/2016, 9:59 AM

## 2016-12-20 ENCOUNTER — Other Ambulatory Visit: Payer: Self-pay | Admitting: Nephrology

## 2016-12-20 DIAGNOSIS — R809 Proteinuria, unspecified: Secondary | ICD-10-CM

## 2016-12-26 ENCOUNTER — Other Ambulatory Visit: Payer: Commercial Managed Care - HMO

## 2017-01-13 MED FILL — GENVOYA TABLET: 150-150-200 | 30 days supply | Qty: 30 | Fill #1

## 2017-01-21 ENCOUNTER — Other Ambulatory Visit: Payer: Commercial Managed Care - HMO

## 2017-01-22 ENCOUNTER — Ambulatory Visit (INDEPENDENT_AMBULATORY_CARE_PROVIDER_SITE_OTHER): Payer: 59 | Admitting: Infectious Disease

## 2017-01-22 ENCOUNTER — Encounter: Payer: Self-pay | Admitting: Infectious Disease

## 2017-01-22 ENCOUNTER — Other Ambulatory Visit (HOSPITAL_COMMUNITY)
Admission: RE | Admit: 2017-01-22 | Discharge: 2017-01-22 | Disposition: A | Discharge status: Home / Self Care | Payer: 59 | Source: Ambulatory Visit | Attending: Infectious Disease | Admitting: Infectious Disease

## 2017-01-22 ENCOUNTER — Ambulatory Visit (INDEPENDENT_AMBULATORY_CARE_PROVIDER_SITE_OTHER): Payer: Commercial Managed Care - HMO | Admitting: Licensed Clinical Social Worker

## 2017-01-22 VITALS — BP 147/92 | HR 89 | Temp 97.9°F | Ht 72.0 in | Wt 172.0 lb

## 2017-01-22 DIAGNOSIS — Z9889 Other specified postprocedural states: Secondary | ICD-10-CM

## 2017-01-22 DIAGNOSIS — R21 Rash and other nonspecific skin eruption: Secondary | ICD-10-CM

## 2017-01-22 DIAGNOSIS — Z113 Encounter for screening for infections with a predominantly sexual mode of transmission: Secondary | ICD-10-CM | POA: Diagnosis not present

## 2017-01-22 DIAGNOSIS — B20 Human immunodeficiency virus [HIV] disease: Secondary | ICD-10-CM | POA: Insufficient documentation

## 2017-01-22 DIAGNOSIS — Z79899 Other long term (current) drug therapy: Secondary | ICD-10-CM | POA: Diagnosis not present

## 2017-01-22 DIAGNOSIS — F819 Developmental disorder of scholastic skills, unspecified: Secondary | ICD-10-CM

## 2017-01-22 DIAGNOSIS — R197 Diarrhea, unspecified: Secondary | ICD-10-CM | POA: Diagnosis not present

## 2017-01-22 DIAGNOSIS — F4322 Adjustment disorder with anxiety: Secondary | ICD-10-CM

## 2017-01-22 DIAGNOSIS — Q224 Congenital tricuspid stenosis: Secondary | ICD-10-CM | POA: Diagnosis not present

## 2017-01-22 HISTORY — DX: Diarrhea, unspecified: R19.7

## 2017-01-22 LAB — COMPLETE METABOLIC PANEL WITH GFR
ALT: 17 U/L (ref 9–46)
AST: 23 U/L (ref 10–40)
Albumin: 4.8 g/dL (ref 3.6–5.1)
Alkaline Phosphatase: 69 U/L (ref 40–115)
BUN: 10 mg/dL (ref 7–25)
CALCIUM: 9.7 mg/dL (ref 8.6–10.3)
CHLORIDE: 103 mmol/L (ref 98–110)
CO2: 18 mmol/L — ABNORMAL LOW (ref 20–31)
CREATININE: 0.91 mg/dL (ref 0.60–1.35)
GFR, Est African American: >89 mL/min (ref >=60)
GFR, Est Non African American: >89 mL/min (ref >=60)
GLUCOSE: 82 mg/dL (ref 65–99)
Potassium: 4 mmol/L (ref 3.5–5.3)
SODIUM: 139 mmol/L (ref 135–146)
Total Bilirubin: 0.6 mg/dL (ref 0.2–1.2)
Total Protein: 8.2 g/dL — ABNORMAL HIGH (ref 6.1–8.1)

## 2017-01-22 LAB — LIPID PANEL
Cholesterol: 203 mg/dL — ABNORMAL HIGH (ref <200)
HDL: 58 mg/dL (ref >40)
LDL CALC: 128 mg/dL — AB (ref <100)
Total CHOL/HDL Ratio: 3.5 Ratio (ref <5.0)
Triglycerides: 86 mg/dL (ref <150)
VLDL: 17 mg/dL (ref <30)

## 2017-01-22 LAB — CBC WITH DIFFERENTIAL/PLATELET
BASOS ABS: 37 {cells}/uL (ref 0–200)
Basophils Relative: 1 %
EOS ABS: 185 {cells}/uL (ref 15–500)
Eosinophils Relative: 5 %
HEMATOCRIT: 49.9 % (ref 38.5–50.0)
Hemoglobin: 16.8 g/dL (ref 13.2–17.1)
LYMPHS PCT: 50 %
Lymphs Abs: 1850 cells/uL (ref 850–3900)
MCH: 30.5 pg (ref 27.0–33.0)
MCHC: 33.7 g/dL (ref 32.0–36.0)
MCV: 90.6 fL (ref 80.0–100.0)
MONO ABS: 518 {cells}/uL (ref 200–950)
MPV: 12 fL (ref 7.5–12.5)
Monocytes Relative: 14 %
NEUTROS PCT: 30 %
Neutro Abs: 1110 cells/uL — ABNORMAL LOW (ref 1500–7800)
Platelets: 220 10*3/uL (ref 140–400)
RBC: 5.51 MIL/uL (ref 4.20–5.80)
RDW: 14.2 % (ref 11.0–15.0)
WBC: 3.7 10*3/uL — AB (ref 3.8–10.8)

## 2017-01-22 NOTE — Progress Notes (Signed)
Integrated Behavioral Health Initial Visit  MRN: [id number] Name: Micheal Hilasaiah Favela   Session Start time: 11:00 am Session End time: 11:16 am Total time: 16 mins  Type of Service: Integrated Behavioral Health- Individual/Family Interpretor:Yes.   Interpretor Name and Language: N/A   Warm Hand Off Completed.       SUBJECTIVE: Micheal Leonard is a 21 y.o. male accompanied by patient. Patient was referred by Dr. Daiva EvesVan Dam due to new HIV diagnosis.  Patient reports the following symptoms/concerns: Patient reported that he initially experienced difficulty receiving news of his diagnosis and difficulty adjusting.  However he currently denied distress or difficulty due to becoming educated about the disease and the role of medications in becoming undetectable.  Patient denied current mental health symptoms, suicidal ideations or plan, and denied need for [facility].   Duration of problem: 3 months; Severity of problem: mild  OBJECTIVE: Mood: Euthymic and Affect: Appropriate Risk of harm to self or others: No plan to harm self or others  Thought process: circumstantial; patient reported having a Learning Disability where he often is not able to verbalize what he is thinking Thought content:logical   LIFE CONTEXT: Family and Social: Patient lives with his parents and his mother attends his medical appointments. School/Work: Patient does not work and is attempting to secure SSDI. Self-Care: Patient is able to tend to his ADL's. Life Changes: Patient newly diagnosed with HIV, along with other current medical issues.  GOALS ADDRESSED: Patient will continue to adjust to having HIV and utilize social support.  Patient will increase medical knowledge about the disease by seeking medical education and learn to differentiate when he needs to share his diagnosis with others.   INTERVENTIONS: Supportive Counseling and Psychoeducation and/or Health Education   ASSESSMENT: Patient  currently adjusting to new HIV diagnosis and will seek assistance when needed.  Patient may benefit from medical education about HIV and his medications.  PLAN: 1. Patient will be referred to Nursing/Pharmacy to provide medical education about HIV and medications. 2. [facility] support as needed. 3.   Micheal Leonard, [facility]

## 2017-01-22 NOTE — Progress Notes (Signed)
Subjective:   Chief complaint: follow-up for acute HIV infection and rash   Patient ID: Micheal Leonard, male    DOB: Mar 09, 1996, 21 y.o.   MRN: [id number]  HPI  21 year old with hx of cognitive delay, congenital heart disease sp fontane procedure at [facility] now admitted with acute HIV syndrome with cervical LA, fevers, and pharyngitis. He had workp that included HIV which was + with VL of half a million. CD4 was 180 but I suspect acutely suppressed in context of his infection as he had tested negative for HIV in the summer.   He was on [place] and Descovy and suppressed virus but had biilateral rash on arms and loose stools and then change to Genvoya after BIKTARVY was not OK with insurance.  He is here for followup. Rash is getting better. Loose stools gone.   No results found for: HIV1RNAQUANT  Lab Results  Component Value Date   CD4TABS 460 11/11/2016      Past Medical History:  Diagnosis Date  . Cardiac anomaly, congenital   . Diarrhea 01/22/2017  . EBV infection    hx/notes 10/23/2016  . Hemoptysis 10/23/2016   [name] 10/23/2016  . Hypertension   . Migraine    "a few/year now; frequency is less than in the past" (10/23/2016)  . Pharyngitis 10/23/2016   notes 10/23/2016  . Pulmonary embolism (HCC) 01/29/2016  . S/P bidirectional Glenn shunt    hx/notes 10/23/2016  . S/P Fontan procedure    hx/notes 10/23/2016  . Tricuspid atresia and stenosis, congenital     Past Surgical History:  Procedure Laterality Date  . CARDIAC CATHETERIZATION  01/2016   at [facility]; hx/notes 10/23/2016  . CARDIAC SURGERY  01/1997; 2000   fontane procedure at [facility] as a child     Family History  Problem Relation Age of Onset  . Hypertension Father       Social History   Social History  . Marital status: Single    Spouse name: N/A  . Number of children: N/A  . Years of education: N/A   Social History Main Topics  . Smoking status: Never Smoker  . Smokeless tobacco: Never Used   Comment: declined condoms  . Alcohol use No  . Drug use: No  . Sexual activity: Yes   Other Topics Concern  . Not on file   Social History Narrative  . No narrative on file    No Known Allergies   Current Outpatient Prescriptions:  .  aspirin EC 81 MG EC tablet, Take 1 tablet (81 mg total) by mouth daily., Disp: 30 tablet, Rfl: 5 .  elvitegravir-cobicistat-emtricitabine-tenofovir (GENVOYA) 150-150-200-10 MG TABS tablet, Take 1 tablet by mouth daily with breakfast., Disp: 30 tablet, Rfl: 5   Review of Systems  Constitutional: Negative for chills, diaphoresis and fever.  HENT: Negative for congestion, hearing loss, sore throat and tinnitus.   Eyes: Negative for photophobia.  Respiratory: Negative for cough, shortness of breath and wheezing.   Cardiovascular: Negative for chest pain, palpitations and leg swelling.  Gastrointestinal: Negative for abdominal pain, blood in stool, constipation, diarrhea, nausea and vomiting.  Genitourinary: Negative for dysuria, flank pain and hematuria.  Musculoskeletal: Negative for back pain and myalgias.  Skin: Positive for rash.  Neurological: Negative for dizziness, weakness and headaches.  Hematological: Does not bruise/bleed easily.  Psychiatric/Behavioral: Negative for suicidal ideas. The patient is not nervous/anxious.        Objective:   Physical Exam  Constitutional: He is oriented to person,  place, and time. He appears well-developed and well-nourished. No distress.  HENT:  Head: Normocephalic and atraumatic.  Mouth/Throat: No oropharyngeal exudate.  Eyes: Conjunctivae and EOM are normal. No scleral icterus.  Neck: Normal range of motion. Neck supple.  Cardiovascular: Normal rate and regular rhythm.   Pulmonary/Chest: Effort normal. No respiratory distress. He has no wheezes.  Abdominal: He exhibits no distension.  Musculoskeletal: He exhibits no edema or tenderness.  Neurological: He is alert and oriented to person, place, and  time. He exhibits normal muscle tone. Coordination normal.  Skin: Skin is warm and dry. Rash noted. He is not diaphoretic. No erythema. No pallor.  Psychiatric: He has a normal mood and affect. Judgment and thought content normal. He is slowed.    Rash on arms 01/22/17:           Assessment & Plan:   Acute HIV disease: continue Genvoya since BIKTARVY still not covered. Recheck labs   Congenital heart disease: continue to follow at [facility]  Cognitive delay: see prior notes. Mom was here for this visit  STI: rescreen for GC and chlamydia and syphilis  Rash: not sure why he had rash but it is improving    I spent greater than 25 minutes with the patient including greater than 50% of time in face to face counsel of the patient re HIV, ARV medications, congenital heart disease, rash, STI s and in coordination of his care.

## 2017-01-22 NOTE — Progress Notes (Signed)
Integrated Behavioral Health Initial Visit  MRN: [id number] Name: Micheal Leonard   Session Start time: 11:00 am Session End time: 11:16 am Total time: 16 mins  Type of Service: Integrated Behavioral Health- Individual/Family Interpretor:Yes.   Interpretor Name and Language: N/A   Warm Hand Off Completed.       SUBJECTIVE: Micheal Hilasaiah Gaunce is a 21 y.o. male accompanied by patient. Patient was referred by Dr. Daiva EvesVan Dam due to new HIV diagnosis.  Patient reports the following symptoms/concerns: Patient reported that he initially experienced difficulty receiving news of his diagnosis and difficulty adjusting.  However he currently denied distress or difficulty due to becoming educated about the disease and the role of medications in becoming undetectable.  Patient denied current mental health symptoms, suicidal ideations or plan, and denied need [facility].   Duration of problem: 3 months; Severity of problem: mild  OBJECTIVE: Mood: Euthymic and Affect: Appropriate Risk of harm to self or others: No plan to harm self or others  Thought process: circumstantial; patient reported having a Learning Disability where he often is not able to verbalize what he is thinking Thought content:logical   LIFE CONTEXT: Family and Social: Patient lives with his parents and his mother attends his medical appointments. School/Work: Patient does not work and is attempting to secure SSDI. Self-Care: Patient is able to tend to his ADL's. Life Changes: Patient newly diagnosed with HIV, along with other current medical issues.  GOALS ADDRESSED: Patient will continue to adjust to having HIV and utilize social support.  Patient will increase medical knowledge about the disease by seeking medical education and learn to differentiate when he needs to share his diagnosis with others.   INTERVENTIONS: Supportive Counseling and Psychoeducation and/or Health Education   ASSESSMENT: Patient  currently adjusting to new HIV diagnosis and will seek assistance when needed.  Patient may benefit from medical education about HIV and his medications.  PLAN: 1. Patient will be referred to Nursing/Pharmacy to provide medical education about HIV and medications. 2. [facility] for additional support as needed.  Vergia AlbertsSherry Rhona Fusilier, [facility]

## 2017-01-23 ENCOUNTER — Other Ambulatory Visit (HOSPITAL_COMMUNITY): Payer: Self-pay | Admitting: Pediatric Cardiology

## 2017-01-23 DIAGNOSIS — Z9889 Other specified postprocedural states: Secondary | ICD-10-CM

## 2017-01-23 LAB — CYTOLOGY, (ORAL, ANAL, URETHRAL) ANCILLARY ONLY
CHLAMYDIA, DNA PROBE: NEGATIVE
CHLAMYDIA, DNA PROBE: NEGATIVE
NEISSERIA GONORRHEA: NEGATIVE
Neisseria Gonorrhea: NEGATIVE

## 2017-01-23 LAB — URINE CYTOLOGY ANCILLARY ONLY
CHLAMYDIA, DNA PROBE: NEGATIVE
Neisseria Gonorrhea: NEGATIVE

## 2017-01-23 LAB — RPR

## 2017-01-23 LAB — T-HELPER CELL (CD4) - (RCID CLINIC ONLY)
CD4 T CELL HELPER: 22 % — AB (ref 33–55)
CD4 T Cell Abs: 450 /uL (ref 400–2700)

## 2017-01-24 ENCOUNTER — Ambulatory Visit
Admission: RE | Admit: 2017-01-24 | Discharge: 2017-01-24 | Disposition: A | Discharge status: Home / Self Care | Payer: 59 | Source: Ambulatory Visit | Attending: Nephrology | Admitting: Nephrology

## 2017-01-24 DIAGNOSIS — R809 Proteinuria, unspecified: Secondary | ICD-10-CM

## 2017-01-29 ENCOUNTER — Ambulatory Visit (HOSPITAL_COMMUNITY): Payer: Commercial Managed Care - HMO

## 2017-01-29 ENCOUNTER — Encounter (HOSPITAL_COMMUNITY): Payer: Self-pay

## 2017-01-29 LAB — HIV RNA, RTPCR W/R GT (RTI, PI,INT)
HIV-1 RNA, QN PCR: 2.31 Log copies/mL — ABNORMAL HIGH
HIV-1 RNA, QN PCR: 202 copies/mL — ABNORMAL HIGH

## 2017-01-30 ENCOUNTER — Telehealth: Payer: Self-pay | Admitting: *Deleted

## 2017-02-10 ENCOUNTER — Ambulatory Visit (HOSPITAL_COMMUNITY)
Admission: RE | Admit: 2017-02-10 | Discharge: 2017-02-10 | Disposition: A | Discharge status: Home / Self Care | Payer: 59 | Source: Ambulatory Visit | Attending: Pediatric Cardiology | Admitting: Pediatric Cardiology

## 2017-02-10 DIAGNOSIS — Z9889 Other specified postprocedural states: Secondary | ICD-10-CM | POA: Diagnosis not present

## 2017-02-10 MED FILL — GENVOYA TABLET: 150-150-200 | 30 days supply | Qty: 30 | Fill #2

## 2017-03-10 MED FILL — LISINOPRIL 5 MG TABLET: 5 | 30 days supply | Qty: 30 | Fill #0

## 2017-03-10 MED FILL — GENVOYA TABLET: 150-150-200 | 30 days supply | Qty: 30 | Fill #3

## 2017-03-19 ENCOUNTER — Ambulatory Visit: Payer: Commercial Managed Care - HMO | Admitting: Infectious Disease

## 2017-04-22 MED FILL — GENVOYA TABLET: 150-150-200 | 30 days supply | Qty: 30 | Fill #4

## 2017-05-05 ENCOUNTER — Ambulatory Visit (INDEPENDENT_AMBULATORY_CARE_PROVIDER_SITE_OTHER): Payer: 59 | Admitting: Infectious Disease

## 2017-05-05 ENCOUNTER — Other Ambulatory Visit (HOSPITAL_COMMUNITY)
Admission: RE | Admit: 2017-05-05 | Discharge: 2017-05-05 | Disposition: A | Discharge status: Home / Self Care | Payer: 59 | Source: Ambulatory Visit | Attending: Infectious Disease | Admitting: Infectious Disease

## 2017-05-05 ENCOUNTER — Other Ambulatory Visit: Payer: Self-pay | Admitting: Pharmacist

## 2017-05-05 VITALS — BP 146/84 | HR 76 | Temp 98.3°F | Ht 72.0 in | Wt 178.0 lb

## 2017-05-05 DIAGNOSIS — Q249 Congenital malformation of heart, unspecified: Secondary | ICD-10-CM | POA: Insufficient documentation

## 2017-05-05 DIAGNOSIS — Z9889 Other specified postprocedural states: Secondary | ICD-10-CM

## 2017-05-05 DIAGNOSIS — B20 Human immunodeficiency virus [HIV] disease: Secondary | ICD-10-CM | POA: Diagnosis not present

## 2017-05-05 DIAGNOSIS — Z8249 Family history of ischemic heart disease and other diseases of the circulatory system: Secondary | ICD-10-CM | POA: Diagnosis not present

## 2017-05-05 DIAGNOSIS — F819 Developmental disorder of scholastic skills, unspecified: Secondary | ICD-10-CM | POA: Diagnosis not present

## 2017-05-05 DIAGNOSIS — Z86711 Personal history of pulmonary embolism: Secondary | ICD-10-CM | POA: Insufficient documentation

## 2017-05-05 DIAGNOSIS — R21 Rash and other nonspecific skin eruption: Secondary | ICD-10-CM | POA: Insufficient documentation

## 2017-05-05 DIAGNOSIS — Z23 Encounter for immunization: Secondary | ICD-10-CM

## 2017-05-05 DIAGNOSIS — I10 Essential (primary) hypertension: Secondary | ICD-10-CM | POA: Insufficient documentation

## 2017-05-05 DIAGNOSIS — Z79899 Other long term (current) drug therapy: Secondary | ICD-10-CM | POA: Diagnosis not present

## 2017-05-05 NOTE — Progress Notes (Signed)
Subjective:   Chief complaint: follow-up for acute HIV infection   Patient ID: Micheal Leonard, male    DOB: 1996-01-24, 21 y.o.   MRN: [id number]  HPI  21 year old with hx of cognitive delay, congenital heart disease sp fontane procedure at [facility] now admitted with acute HIV syndrome with cervical LA, fevers, and pharyngitis. He had workp that included HIV which was + with VL of half a million. CD4 was 180 but I suspect acutely suppressed in context of his infection as he had tested negative for HIV in the summer.   He was on [place] and Descovy and suppressed virus but had biilateral rash on arms and loose stools and then change to Genvoya after BIKTARVY was not OK with insurance.  He comes to clinic with his Mom again.   He admits to sometimes forgetting to take his Genvoya in the morning when he gets up to go to work.He states one missed dose past week, none 3 weeks prior.  I told him his virus SHOULD be better controlled by now and warned about risk of VF with resistance esp on Genvoya.     Past Medical History:  Diagnosis Date  . Cardiac anomaly, congenital   . Diarrhea 01/22/2017  . EBV infection    hx/notes 10/23/2016  . Hemoptysis 10/23/2016   [name] 10/23/2016  . Hypertension   . Migraine    "a few/year now; frequency is less than in the past" (10/23/2016)  . Pharyngitis 10/23/2016   notes 10/23/2016  . Pulmonary embolism (HCC) 01/29/2016  . S/P bidirectional Glenn shunt    hx/notes 10/23/2016  . S/P Fontan procedure    hx/notes 10/23/2016  . Tricuspid atresia and stenosis, congenital     Past Surgical History:  Procedure Laterality Date  . CARDIAC CATHETERIZATION  01/2016   at [facility]; hx/notes 10/23/2016  . CARDIAC SURGERY  01/1997; 2000   fontane procedure at [facility] as a child     Family History  Problem Relation Age of Onset  . Hypertension Father       Social History   Social History  . Marital status: Single    Spouse name: N/A  . Number of children: N/A   . Years of education: N/A   Social History Main Topics  . Smoking status: Never Smoker  . Smokeless tobacco: Never Used     Comment: declined condoms  . Alcohol use No  . Drug use: No  . Sexual activity: Yes   Other Topics Concern  . Not on file   Social History Narrative  . No narrative on file    No Known Allergies   Current Outpatient Prescriptions:  .  elvitegravir-cobicistat-emtricitabine-tenofovir (GENVOYA) 150-150-200-10 MG TABS tablet, Take 1 tablet by mouth daily with breakfast., Disp: 30 tablet, Rfl: 5 .  aspirin EC 81 MG EC tablet, Take 1 tablet (81 mg total) by mouth daily. (Patient not taking: Reported on 05/05/2017), Disp: 30 tablet, Rfl: 5   Review of Systems  Constitutional: Negative for chills, diaphoresis and fever.  HENT: Negative for congestion, hearing loss, sore throat and tinnitus.   Eyes: Negative for photophobia.  Respiratory: Negative for cough, shortness of breath and wheezing.   Cardiovascular: Negative for chest pain, palpitations and leg swelling.  Gastrointestinal: Negative for abdominal pain, blood in stool, constipation, diarrhea, nausea and vomiting.  Genitourinary: Negative for dysuria, flank pain and hematuria.  Musculoskeletal: Negative for back pain and myalgias.  Neurological: Negative for dizziness, weakness and headaches.  Hematological: Does  not bruise/bleed easily.  Psychiatric/Behavioral: Negative for agitation, behavioral problems, confusion and suicidal ideas. The patient is not nervous/anxious.        Objective:   Physical Exam  Constitutional: He is oriented to person, place, and time. He appears well-developed and well-nourished. No distress.  HENT:  Head: Normocephalic and atraumatic.  Mouth/Throat: No oropharyngeal exudate.  Eyes: Conjunctivae and EOM are normal. No scleral icterus.  Neck: Normal range of motion. Neck supple.  Cardiovascular: Normal rate and regular rhythm.   Pulmonary/Chest: Effort normal. No  respiratory distress. He has no wheezes.  Abdominal: He exhibits no distension.  Musculoskeletal: He exhibits no edema or tenderness.  Neurological: He is alert and oriented to person, place, and time. He exhibits normal muscle tone. Coordination normal.  Skin: Skin is warm and dry. Rash noted. He is not diaphoretic. No erythema. No pallor.  Psychiatric: He has a normal mood and affect. Judgment and thought content normal. He is slowed.    Rash on arms 01/22/17:      He only has residual scars from rash     Assessment & Plan:   Acute HIV disease: continue Genvoya and recheck labs today, RTC in 3 months. BIKTARVy would be better drug if it is now covered. I   Congenital [facility] disease: [facility]  Cognitive delay: see prior notes. Mom was here for this visit and I filled out FMLA paperwork for her  STI: rescreen for GC and chlamydia and syphilis  Rash: not sure why he had rash but it is improving I DOUBT it was related to INSTI  I spent greater than 25 minutes with the patient including greater than 50% of time in face to face counsel of the patient and Mom re the utmost importance of him being hihgly adherent to his GENVOYA, the lower barrier to R of this drug, the consequences of VF with resistance including compromising future regimens, I also filled out FMLA forms with him and his mother to document when he was diagnosed with acute infection and outlining how often he would need to be seen  and in coordination of his care.

## 2017-05-06 LAB — CBC WITH DIFFERENTIAL/PLATELET
BASOS PCT: 1 %
Basophils Absolute: 39 cells/uL (ref 0–200)
Eosinophils Absolute: 203 cells/uL (ref 15–500)
Eosinophils Relative: 5.2 %
HCT: 45.7 % (ref 38.5–50.0)
Hemoglobin: 15.2 g/dL (ref 13.2–17.1)
Lymphs Abs: 1396 cells/uL (ref 850–3900)
MCH: 29.4 pg (ref 27.0–33.0)
MCHC: 33.3 g/dL (ref 32.0–36.0)
MCV: 88.4 fL (ref 80.0–100.0)
MPV: 12.1 fL (ref 7.5–12.5)
Monocytes Relative: 11.4 %
Neutro Abs: 1817 cells/uL (ref 1500–7800)
Neutrophils Relative %: 46.6 %
PLATELETS: 198 10*3/uL (ref 140–400)
RBC: 5.17 10*6/uL (ref 4.20–5.80)
RDW: 13.6 % (ref 11.0–15.0)
TOTAL LYMPHOCYTE: 35.8 %
WBC: 3.9 10*3/uL (ref 3.8–10.8)
WBCMIX: 445 {cells}/uL (ref 200–950)

## 2017-05-06 LAB — COMPLETE METABOLIC PANEL WITH GFR
AG Ratio: 1.4 (calc) (ref 1.0–2.5)
ALKALINE PHOSPHATASE (APISO): 82 U/L (ref 40–115)
ALT: 16 U/L (ref 9–46)
AST: 21 U/L (ref 10–40)
Albumin: 4.7 g/dL (ref 3.6–5.1)
BUN: 11 mg/dL (ref 7–25)
CO2: 28 mmol/L (ref 20–32)
CREATININE: 0.97 mg/dL (ref 0.60–1.35)
Calcium: 9.8 mg/dL (ref 8.6–10.3)
Chloride: 103 mmol/L (ref 98–110)
GFR, Est African American: 130 mL/min/{1.73_m2} (ref >=60)
GFR, Est Non African American: 112 mL/min/{1.73_m2} (ref >=60)
GLUCOSE: 84 mg/dL (ref 65–99)
Globulin: 3.3 g/dL (calc) (ref 1.9–3.7)
Potassium: 4.6 mmol/L (ref 3.5–5.3)
Sodium: 139 mmol/L (ref 135–146)
Total Bilirubin: 0.6 mg/dL (ref 0.2–1.2)
Total Protein: 8 g/dL (ref 6.1–8.1)

## 2017-05-06 LAB — RPR: RPR: NONREACTIVE

## 2017-05-06 LAB — URINE CYTOLOGY ANCILLARY ONLY
CHLAMYDIA, DNA PROBE: POSITIVE — AB
Neisseria Gonorrhea: NEGATIVE

## 2017-05-06 LAB — T-HELPER CELL (CD4) - (RCID CLINIC ONLY)
CD4 % Helper T Cell: 19 % — ABNORMAL LOW (ref 33–55)
CD4 T CELL ABS: 300 /uL — AB (ref 400–2700)

## 2017-05-07 ENCOUNTER — Telehealth: Payer: Self-pay | Admitting: *Deleted

## 2017-05-07 NOTE — Telephone Encounter (Signed)
One gram azithromycin should be fine if this is chlamydia in urine. His partners x 30 days prior need treatment as well

## 2017-05-07 NOTE — Telephone Encounter (Signed)
-----   Message from [name], MD sent at 05/06/2017  4:17 PM EDT ----- He needs azithromycin 2 gram vs doxycycline 100mg  bid

## 2017-05-07 NOTE — Telephone Encounter (Signed)
Left message on patient's voicemail (he identified himself in the message) asking him to call back for lab results and medication to treat it. Please advise which treatment you would prefer, and for how long. Andree CossHowell, Graceson Nichelson M, RN

## 2017-05-09 ENCOUNTER — Telehealth: Payer: Self-pay | Admitting: *Deleted

## 2017-05-09 ENCOUNTER — Other Ambulatory Visit: Payer: Self-pay | Admitting: *Deleted

## 2017-05-09 DIAGNOSIS — A749 Chlamydial infection, unspecified: Secondary | ICD-10-CM

## 2017-05-09 MED ORDER — AZITHROMYCIN 250 MG PO TABS
1000.0000 mg | ORAL_TABLET | Freq: Once | ORAL | 0 refills | Status: AC
Start: 2017-05-09 — End: 2017-05-09

## 2017-05-09 NOTE — Telephone Encounter (Signed)
Left message asking patient to call back for lab results. Will send 1 gm azithromycin once by mouth to [facility].

## 2017-05-09 NOTE — Telephone Encounter (Signed)
Patient called back. Results, partner testing, medication information given to him.

## 2017-05-09 NOTE — Telephone Encounter (Signed)
Thanks Michelle

## 2017-05-09 NOTE — Telephone Encounter (Signed)
-----   Message from [name], MD sent at 05/09/2017  3:27 PM EDT ----- WE ARE REALLY REALLY NOT SEEING KIND OF VIRAL LOAD ISIAIH SHOULD BE HAVING HE SHOULD BE WITH VL <20 OR CLOSE TO THIS. MAY NEED TO CONSIDER CHANGING TO PI BASED REGIMEN IF THIS CONTINUES  CAN WE HAVE HIM SEEN IN NEXT 2 WEEKS BY ID PHARMACY OR MYSELF TO RECHECK HIS VIRAL LOAD AND ASSESS HIS ADHERENCE?

## 2017-05-09 NOTE — Telephone Encounter (Signed)
Left message asking him to schedule follow up in the next couple of weeks with Dr Daiva EvesVan Dam or Pharmacy to make sure his medications are right. Andree CossHowell, Cherica Heiden M, RN

## 2017-05-12 ENCOUNTER — Telehealth: Payer: Self-pay | Admitting: Pharmacist Clinician (PhC)/ Clinical Pharmacy Specialist

## 2017-05-12 NOTE — Telephone Encounter (Signed)
Thanks Michelle

## 2017-05-12 NOTE — Telephone Encounter (Signed)
His sexual partners will also need to be treated for chlamydia

## 2017-05-12 NOTE — Telephone Encounter (Signed)
VL still not suppressed. Left a VM to call back so we can make an appt to change ART. Currently on Genvoya.

## 2017-05-14 LAB — HIV-1 INTEGRASE GENOTYPE

## 2017-05-14 LAB — HIV RNA, RTPCR W/R GT (RTI, PI,INT)
HIV 1 RNA QUANT: 447 {copies}/mL — AB
HIV-1 RNA Quant, Log: 2.65 Log copies/mL — ABNORMAL HIGH

## 2017-05-14 LAB — HIV-1 GENOTYPE: HIV-1 Genotype: DETECTED — AB

## 2017-05-14 NOTE — Telephone Encounter (Signed)
thx and Mom is also involved I believe

## 2017-05-14 NOTE — Telephone Encounter (Signed)
Ok excelllent

## 2017-05-14 NOTE — Telephone Encounter (Signed)
Will reach out to AnthostonManuel.

## 2017-05-14 NOTE — Telephone Encounter (Signed)
Attempted to call patient again today, unable to leave a message.

## 2017-05-14 NOTE — Telephone Encounter (Signed)
I wonder if Micheal Leonard might be able to engage with him?

## 2017-05-14 NOTE — Telephone Encounter (Signed)
Minh reached him, scheduled him for Friday at 9.

## 2017-05-16 ENCOUNTER — Ambulatory Visit (INDEPENDENT_AMBULATORY_CARE_PROVIDER_SITE_OTHER): Payer: 59 | Admitting: Pharmacist Clinician (PhC)/ Clinical Pharmacy Specialist

## 2017-05-16 DIAGNOSIS — B20 Human immunodeficiency virus [HIV] disease: Secondary | ICD-10-CM | POA: Diagnosis not present

## 2017-05-16 MED ORDER — DARUNAVIR-COBICISTAT 800-150 MG PO TABS: 1.0000 | ORAL_TABLET | Freq: Every day | ORAL | 5 refills | Status: DC

## 2017-05-16 MED ORDER — DARUNAVIR-COBICISTAT 800-150 MG PO TABS: 1.0000 | ORAL_TABLET | Freq: Every day | ORAL | 11 refills | Status: DC

## 2017-05-16 MED ORDER — BICTEGRAVIR-EMTRICITAB-TENOFOV 50-200-25 MG PO TABS: 1.0000 | ORAL_TABLET | Freq: Every day | ORAL | 11 refills | Status: DC

## 2017-05-16 MED ORDER — BICTEGRAVIR-EMTRICITAB-TENOFOV 50-200-25 MG PO TABS: 1.0000 | ORAL_TABLET | Freq: Every day | ORAL | 5 refills | Status: DC

## 2017-05-16 MED FILL — AZITHROMYCIN 250 MG TAB: 250 | 1 days supply | Qty: 4 | Fill #0

## 2017-05-16 MED FILL — PREZCOBIX 800 MG-150 MG TAB: 800-150 | 30 days supply | Qty: 30 | Fill #0

## 2017-05-16 MED FILL — BIKTARVY 50-200-25 MG TABS: 50-200-25 | 30 days supply | Qty: 30 | Fill #0

## 2017-05-16 NOTE — Progress Notes (Signed)
Agreed with Emily's note. He had some reaction to Tivicay previously with some rash. Therefore, we will change him to [facility] and Prezcobix. Even though data don't support intensification, we will do it for now and can possibly taper off once he is suppressed. We will see him back in Dec for labs.   Ulyses SouthwardMinh Mileydi Milsap, PharmD, BCPS, AAHIVP, CPP Infectious Disease Pharmacist Pager: [phone] 05/16/2017 1:24 PM

## 2017-05-16 NOTE — Progress Notes (Signed)
HPI: Micheal Leonard is a 21 y.o. male who presents to the pharmacy clinic today for a follow-up on his HIV disease.   Allergies: No Known Allergies  Vitals:    Past Medical History: Past Medical History:  Diagnosis Date  . Cardiac anomaly, congenital   . Diarrhea 01/22/2017  . EBV infection    hx/notes 10/23/2016  . Hemoptysis 10/23/2016   Micheal Leonard 10/23/2016  . Hypertension   . Migraine    "a few/year now; frequency is less than in the past" (10/23/2016)  . Pharyngitis 10/23/2016   notes 10/23/2016  . Pulmonary embolism (HCC) 01/29/2016  . S/P bidirectional Glenn shunt    hx/notes 10/23/2016  . S/P Fontan procedure    hx/notes 10/23/2016  . Tricuspid atresia and stenosis, congenital     Social History: Social History   Social History  . Marital status: Single    Spouse name: N/A  . Number of children: N/A  . Years of education: N/A   Social History Main Topics  . Smoking status: Never Smoker  . Smokeless tobacco: Never Used     Comment: declined condoms  . Alcohol use No  . Drug use: No  . Sexual activity: Yes   Other Topics Concern  . Not on file   Social History Narrative  . No narrative on file    Previous Regimen: Genvoya  Current Regimen: Biktarvy + Prezcobix   Labs: HIV 1 RNA Quant (copies/mL)  Date Value  05/05/2017 447 (H)   CD4 T Cell Abs (/uL)  Date Value  05/05/2017 300 (L)  01/22/2017 450  11/11/2016 460   Hepatitis B Surface Ag (no units)  Date Value  10/25/2016 Negative   HCV Ab (s/co ratio)  Date Value  01/29/2016 0.1    CrCl: Estimated Creatinine Clearance: 133.3 mL/min (by C-G formula based on SCr of 0.97 mg/dL).  Lipids:    Component Value Date/Time   CHOL 203 (H) 01/22/2017 1051   TRIG 86 01/22/2017 1051   HDL 58 01/22/2017 1051   CHOLHDL 3.5 01/22/2017 1051   VLDL 17 01/22/2017 1051   LDLCALC 128 (H) 01/22/2017 1051    Assessment: Micheal Leonard presents to the pharmacy clinic today for a follow-up on his HIV disease.  On his last visit on 10/22, his viral load was still elevated at 447. Per Micheal Leonard, he has been taking his Genvoya every day with food before he goes to work. His Leonard also backs him up and said that he only missed a few doses when he forgot to take his medication with him on vacation.   Given this information, we will switch him to [name] + Prezcobix in hopes of completely suppressing his virus. He previously had a rash with Tivicay but has been tolerating Genvoya well. He fills at the [facility]. We were able to run a test claim and discover that both were covered by his insurance and put in copay cards for him as well so it should be $0 copay when he picks up.   Instructed Micheal Leonard and his Leonard to stop the [name] today since he had not taken his morning dose and start the Biktarvy + Prezcobix after he picks them up today. Encouraged Micheal Leonard to continue taking the medications with food and to try for his most consistent meal of the day (Dinner or [employer]) because he eats breakfast at a different time every morning. We will follow-up with him in 6 weeks to see how he is doing on his new  regimen.   Micheal Leonard had some questions about PrEP and we told her that we had those services here. She may be interested when she enters a new relationship. We encouraged her to contact Micheal Leonard if she was interested and also encouraged Micheal Leonard to spread the word to his friends if he knew anyone that may need PrEP.   Recommendations: - Stop Genvoya today - Start Biktarvy + Prezcobix - F/u with pharmacy on 06/24/17 at 9 AM - F/U with Micheal Leonard on 08/12/17 at 4:15 PM  Micheal Leonard, PharmD PGY2 Infectious Diseases Pharmacy Resident  [facility] 05/16/2017, 9:42 AM

## 2017-06-10 NOTE — Telephone Encounter (Signed)
The intent of this communication is to inform the Health Care Team that this patient will be discharged from [facility]). At this time the goals have been met and the patient is virally suppressed. Patient states he has been compliant with medication adherence without any problems noted. Moving forward, the [facility] will be willing to reopen the patient to services if the need arises. Effective 01/1917 patient will be discharged and removed from [facility] active patient listing.

## 2017-06-16 MED FILL — BIKTARVY 50-200-25 MG TABS: 50-200-25 | 30 days supply | Qty: 30 | Fill #1

## 2017-06-16 MED FILL — PREZCOBIX 800 MG-150 MG TAB: 800-150 | 30 days supply | Qty: 30 | Fill #1

## 2017-06-24 ENCOUNTER — Ambulatory Visit: Payer: 59

## 2017-06-30 ENCOUNTER — Ambulatory Visit (INDEPENDENT_AMBULATORY_CARE_PROVIDER_SITE_OTHER): Payer: 59 | Admitting: Pharmacist

## 2017-06-30 DIAGNOSIS — B20 Human immunodeficiency virus [HIV] disease: Secondary | ICD-10-CM

## 2017-06-30 NOTE — Progress Notes (Signed)
[facility] Pharmacy Visit  HPI: Micheal Leonard is a 21 y.o. male who presents to the [facility] pharmacy clinic for HIV follow-up.  Patient Active Problem List   Diagnosis Date Noted  . Diarrhea 01/22/2017  . Bleeding gums 11/02/2016  . Sinus tachycardia 11/02/2016  . Cognitive developmental delay 10/25/2016  . Pulmonary embolism (HCC) 10/24/2016  . HIV disease (HCC) 10/24/2016  . Chlamydial infection of pharynx 10/23/2016  . S/P Fontan procedure 10/23/2016  . S/P bidirectional Glenn shunt 10/23/2016  . Tricuspid atresia with normal great arteries 10/23/2016  . Proteinuria 10/23/2016      Medication List        Accurate as of 06/30/17  2:48 PM. Always use your most recent med list.          aspirin 81 MG EC tablet Take 1 tablet (81 mg total) by mouth daily.   bictegravir-emtricitabine-tenofovir AF 50-200-25 MG Tabs tablet Commonly known as:  BIKTARVY Take 1 tablet by mouth daily.   darunavir-cobicistat 800-150 MG tablet Commonly known as:  PREZCOBIX Take 1 tablet by mouth daily with breakfast. Swallow whole. Do NOT crush, break or chew tablets. Take with food.       Allergies: No Known Allergies  Past Medical History: Past Medical History:  Diagnosis Date  . Cardiac anomaly, congenital   . Diarrhea 01/22/2017  . EBV infection    hx/notes 10/23/2016  . Hemoptysis 10/23/2016   [name]/notes 10/23/2016  . Hypertension   . Migraine    "a few/year now; frequency is less than in the past" (10/23/2016)  . Pharyngitis 10/23/2016   notes 10/23/2016  . Pulmonary embolism (HCC) 01/29/2016  . S/P bidirectional Glenn shunt    hx/notes 10/23/2016  . S/P Fontan procedure    hx/notes 10/23/2016  . Tricuspid atresia and stenosis, congenital     Social History: Social History   Socioeconomic History  . Marital status: Single    Spouse name: Not on file  . Number of children: Not on file  . Years of education: Not on file  . Highest education level:  Not on file  Social Needs  . Financial resource strain: Not on file  . Food insecurity - worry: Not on file  . Food insecurity - inability: Not on file  . Transportation needs - medical: Not on file  . Transportation needs - non-medical: Not on file  Occupational History  . Not on file  Tobacco Use  . Smoking status: Never Smoker  . Smokeless tobacco: Never Used  . Tobacco comment: declined condoms  Substance and Sexual Activity  . Alcohol use: No  . Drug use: No  . Sexual activity: Yes  Other Topics Concern  . Not on file  Social History Narrative  . Not on file    Labs: HIV 1 RNA Quant (copies/mL)  Date Value  05/05/2017 447 (H)   CD4 T Cell Abs (/uL)  Date Value  05/05/2017 300 (L)  01/22/2017 450  11/11/2016 460   Hepatitis B Surface Ag (no units)  Date Value  10/25/2016 Negative   HCV Ab (s/co ratio)  Date Value  01/29/2016 0.1    Lipids:    Component Value Date/Time   CHOL 203 (H) 01/22/2017 1051   TRIG 86 01/22/2017 1051   HDL 58 01/22/2017 1051   CHOLHDL 3.5 01/22/2017 1051   VLDL 17 01/22/2017 1051   LDLCALC 128 (H) 01/22/2017 1051    Current HIV Regimen: Biktarvy + Prezcobix  Assessment: Micheal Leonard is here  today to follow-up for his HIV infection.  He was originally on [place] and Descovy but had persistent diarrhea and possibly a rash while on this regimen.  [facility].  His viral load has consistently been detectable even when he tells us he doesn't miss doses.  We [facility] Prezcobix ~1.5 months ago to try and get his viral load suppressed.   He is able to tell me the names of his medications (he knows Biktarvy and was able to tell me that the other one was a big pink pill).  He tells me today that he missed one dose when he forgot since he started on the new regimen.  He takes it with his lunch every day at work on his break or around noon when he is off.  He had his pillbox key chain with him today  and states he uses it at work.  He isn't having any trouble tolerating the new regimen - no diarrhea, headaches, nausea, rash, or vomiting. He tells me he always takes it with food.  I reiterated the importance of taking it every day and told him to try and not miss a single dose from now until he sees Dr. Daiva EvesVan Dam.  Will get a viral load today to see how it is doing since switching on 11/2. He follows up with Dr. Daiva EvesVan Dam at the end of January.  Plan: - Continue Biktarvy PO once daily + Prezcobix PO once daily with food - HIV RNA today - F/u with Dr. Daiva EvesVan Dam 08/12/17 at 415pm  Iyauna Sing L. Nhan Qualley, PharmD, AAHIVP, CPP Infectious Diseases Clinical Pharmacist [facility] 06/30/2017, 2:48 PM

## 2017-07-02 LAB — HIV-1 RNA QUANT-NO REFLEX-BLD
HIV 1 RNA QUANT: NOT DETECTED {copies}/mL
HIV-1 RNA QUANT, LOG: NOT DETECTED {Log_copies}/mL

## 2017-07-02 NOTE — Progress Notes (Signed)
Finally undetectable!

## 2017-08-05 ENCOUNTER — Other Ambulatory Visit: Payer: 59

## 2017-08-05 DIAGNOSIS — B20 Human immunodeficiency virus [HIV] disease: Secondary | ICD-10-CM

## 2017-08-06 LAB — COMPLETE METABOLIC PANEL WITH GFR
AG Ratio: 1.5 (calc) (ref 1.0–2.5)
ALKALINE PHOSPHATASE (APISO): 70 U/L (ref 40–115)
ALT: 26 U/L (ref 9–46)
AST: 30 U/L (ref 10–40)
Albumin: 4.9 g/dL (ref 3.6–5.1)
BUN: 11 mg/dL (ref 7–25)
CALCIUM: 9.9 mg/dL (ref 8.6–10.3)
CO2: 24 mmol/L (ref 20–32)
CREATININE: 1.2 mg/dL (ref 0.60–1.35)
Chloride: 107 mmol/L (ref 98–110)
GFR, EST NON AFRICAN AMERICAN: 87 mL/min/{1.73_m2} (ref >=60)
GFR, Est African American: 100 mL/min/{1.73_m2} (ref >=60)
GLUCOSE: 84 mg/dL (ref 65–99)
Globulin: 3.3 g/dL (calc) (ref 1.9–3.7)
Potassium: 4.4 mmol/L (ref 3.5–5.3)
Sodium: 142 mmol/L (ref 135–146)
Total Bilirubin: 0.3 mg/dL (ref 0.2–1.2)
Total Protein: 8.2 g/dL — ABNORMAL HIGH (ref 6.1–8.1)

## 2017-08-06 LAB — CBC WITH DIFFERENTIAL/PLATELET
BASOS ABS: 41 {cells}/uL (ref 0–200)
Basophils Relative: 1.2 %
EOS PCT: 6.1 %
Eosinophils Absolute: 207 cells/uL (ref 15–500)
HEMATOCRIT: 50.5 % — AB (ref 38.5–50.0)
HEMOGLOBIN: 17 g/dL (ref 13.2–17.1)
LYMPHS ABS: 1503 {cells}/uL (ref 850–3900)
MCH: 29.5 pg (ref 27.0–33.0)
MCHC: 33.7 g/dL (ref 32.0–36.0)
MCV: 87.7 fL (ref 80.0–100.0)
MPV: 12.3 fL (ref 7.5–12.5)
Monocytes Relative: 10.5 %
NEUTROS ABS: 1292 {cells}/uL — AB (ref 1500–7800)
Neutrophils Relative %: 38 %
Platelets: 212 10*3/uL (ref 140–400)
RBC: 5.76 10*6/uL (ref 4.20–5.80)
RDW: 13 % (ref 11.0–15.0)
Total Lymphocyte: 44.2 %
WBC: 3.4 10*3/uL — ABNORMAL LOW (ref 3.8–10.8)
WBCMIX: 357 {cells}/uL (ref 200–950)

## 2017-08-06 LAB — T-HELPER CELL (CD4) - (RCID CLINIC ONLY)
CD4 T CELL ABS: 300 /uL — AB (ref 400–2700)
CD4 T CELL HELPER: 18 % — AB (ref 33–55)

## 2017-08-06 LAB — RPR: RPR Ser Ql: NONREACTIVE

## 2017-08-07 LAB — HIV-1 RNA QUANT-NO REFLEX-BLD
HIV 1 RNA Quant: <20 copies/mL — AB
HIV-1 RNA Quant, Log: <1.3 Log copies/mL — AB

## 2017-08-11 MED FILL — PREZCOBIX 800 MG-150 MG TAB: 800-150 | 30 days supply | Qty: 30 | Fill #2

## 2017-08-11 MED FILL — BIKTARVY 50-200-25 MG TABS: 50-200-25 | 30 days supply | Qty: 30 | Fill #2

## 2017-08-12 ENCOUNTER — Ambulatory Visit: Payer: 59 | Admitting: Infectious Disease

## 2017-08-12 ENCOUNTER — Encounter: Payer: Self-pay | Admitting: Infectious Disease

## 2017-08-12 VITALS — BP 129/78 | HR 92 | Temp 98.1°F | Ht 72.0 in | Wt 168.0 lb

## 2017-08-12 DIAGNOSIS — B20 Human immunodeficiency virus [HIV] disease: Secondary | ICD-10-CM

## 2017-08-12 DIAGNOSIS — Z113 Encounter for screening for infections with a predominantly sexual mode of transmission: Secondary | ICD-10-CM

## 2017-08-12 DIAGNOSIS — Z9889 Other specified postprocedural states: Secondary | ICD-10-CM

## 2017-08-12 DIAGNOSIS — Z79899 Other long term (current) drug therapy: Secondary | ICD-10-CM

## 2017-08-12 DIAGNOSIS — Z23 Encounter for immunization: Secondary | ICD-10-CM

## 2017-08-12 NOTE — Progress Notes (Signed)
Subjective:   Chief complaint: follow-up for acute HIV infection, disease   Patient ID: Micheal Leonard, male    DOB: [birth year], 22 y.o.   MRN: [id number]  HPI  22 year old with hx of cognitive delay, congenital heart disease sp [facility] now admitted with acute HIV syndrome with cervical LA, fevers, and pharyngitis. He had workp that included HIV which was + with VL of half a million. CD4 was 180 but I suspect acutely suppressed in context of his infection as he had tested negative for HIV in the summer.   He was on [place] and Descovy and suppressed virus but had biilateral rash on arms and loose stools and then change to Genvoya after BIKTARVY was not OK with insurance.  We were worried about him having risk for VF with resistance and added Prezcobix to [facility] and he has now been with VL <20 at last 2 tests.    Past Medical History:  Diagnosis Date  . Cardiac anomaly, congenital   . Diarrhea 01/22/2017  . EBV infection    hx/notes 10/23/2016  . Hemoptysis 10/23/2016   [name]/notes 10/23/2016  . Hypertension   . Migraine    "a few/year now; frequency is less than in the past" (10/23/2016)  . Pharyngitis 10/23/2016   notes 10/23/2016  . Pulmonary embolism (HCC) 01/29/2016  . S/P bidirectional Glenn shunt    hx/notes 10/23/2016  . S/P Fontan procedure    hx/notes 10/23/2016  . Tricuspid atresia and stenosis, congenital     Past Surgical History:  Procedure Laterality Date  . CARDIAC CATHETERIZATION  01/2016   [facility]; hx/notes 10/23/2016  . CARDIAC SURGERY  01/1997; 2000   [facility]     Family History  Problem Relation Age of Onset  . Hypertension Father       Social History   Socioeconomic History  . Marital status: Single    Spouse name: None  . Number of children: None  . Years of education: None  . Highest education level: None  Social Needs  . Financial resource strain: None  . Food insecurity - worry: None  . Food  insecurity - inability: None  . Transportation needs - medical: None  . Transportation needs - non-medical: None  Occupational History  . None  Tobacco Use  . Smoking status: Never Smoker  . Smokeless tobacco: Never Used  . Tobacco comment: declined condoms  Substance and Sexual Activity  . Alcohol use: No  . Drug use: No  . Sexual activity: Yes  Other Topics Concern  . None  Social History Narrative  . None    No Known Allergies   Current Outpatient Medications:  .  bictegravir-emtricitabine-tenofovir AF (BIKTARVY) 50-200-25 MG TABS tablet, Take 1 tablet by mouth daily., Disp: 30 tablet, Rfl: 5 .  darunavir-cobicistat (PREZCOBIX) 800-150 MG tablet, Take 1 tablet by mouth daily with breakfast. Swallow whole. Do NOT crush, break or chew tablets. Take with food., Disp: 30 tablet, Rfl: 5 .  aspirin EC 81 MG EC tablet, Take 1 tablet (81 mg total) by mouth daily. (Patient not taking: Reported on 05/05/2017), Disp: 30 tablet, Rfl: 5   Review of Systems  Constitutional: Negative for chills, diaphoresis and fever.  HENT: Negative for congestion, hearing loss, sore throat and tinnitus.   Eyes: Negative for photophobia.  Respiratory: Negative for cough, shortness of breath and wheezing.   Cardiovascular: Negative for chest pain, palpitations and leg swelling.  Gastrointestinal: Negative  for abdominal pain, blood in stool, constipation, diarrhea, nausea and vomiting.  Genitourinary: Negative for dysuria, flank pain and hematuria.  Musculoskeletal: Negative for back pain and myalgias.  Neurological: Negative for dizziness, weakness and headaches.  Hematological: Does not bruise/bleed easily.  Psychiatric/Behavioral: Negative for agitation, behavioral problems, confusion and suicidal ideas. The patient is not nervous/anxious.        Objective:   Physical Exam  Constitutional: He is oriented to person, place, and time. He appears well-developed and well-nourished. No distress.  HENT:    Head: Normocephalic and atraumatic.  Mouth/Throat: No oropharyngeal exudate.  Eyes: Conjunctivae and EOM are normal. No scleral icterus.  Neck: Normal range of motion. Neck supple.  Cardiovascular: Normal rate and regular rhythm.  Pulmonary/Chest: Effort normal. No respiratory distress. He has no wheezes.  Abdominal: He exhibits no distension.  Musculoskeletal: He exhibits no edema or tenderness.  Neurological: He is alert and oriented to person, place, and time. He exhibits normal muscle tone. Coordination normal.  Skin: Skin is warm and dry. He is not diaphoretic. No erythema. No pallor.  Psychiatric: He has a normal mood and affect. Judgment and thought content normal. He is slowed.     Assessment & Plan:   Acute HIV disease: continue Prezcobix and Biktarvy for now. In future if he maintains suppression. Consider drop to STR such as SYMTUZA or BIKTARVY. Maybe favor the former given longer track record and HIGHER barrier to R   Congenital heart disease: continue to follow at [facility]  Cognitive delay: see prior notes. Mom was here for this visit and I filled out FMLA paperwork for her  STI: continue to screen for STI's  I spent greater than 25 minutes with the patient including greater than 50% of time in face to face counsel of the patient and his family member re nature of HIV and need to continue high level of adherence to keep VL <20 and preserve health and quality and quantity of life and prevent infection to others since we now know about U=UI data from partners study and in coordination of his care.Marland Kitchen

## 2017-08-14 ENCOUNTER — Other Ambulatory Visit: Payer: Self-pay | Admitting: Pharmacist

## 2017-09-02 ENCOUNTER — Telehealth: Payer: Self-pay | Admitting: Pharmacist Clinician (PhC)/ Clinical Pharmacy Specialist

## 2017-09-02 NOTE — Telephone Encounter (Signed)
Micheal Leonard called to asked if the current symptoms he is having is related to the medication he is on. He complains on body aches last night. He could very well have the flu. He has no fevers, but mainly body aches.  Advised him to go to urgent care so they can evaluate him for flu and get med.

## 2017-09-02 NOTE — Telephone Encounter (Signed)
Thanks Minh! 

## 2017-09-06 ENCOUNTER — Emergency Department (HOSPITAL_COMMUNITY): Payer: 59

## 2017-09-06 ENCOUNTER — Encounter (HOSPITAL_COMMUNITY): Payer: Self-pay | Admitting: Emergency Medicine

## 2017-09-06 DIAGNOSIS — N50812 Left testicular pain: Secondary | ICD-10-CM | POA: Diagnosis not present

## 2017-09-06 DIAGNOSIS — Z79899 Other long term (current) drug therapy: Secondary | ICD-10-CM | POA: Insufficient documentation

## 2017-09-06 DIAGNOSIS — N451 Epididymitis: Secondary | ICD-10-CM | POA: Diagnosis not present

## 2017-09-06 DIAGNOSIS — I10 Essential (primary) hypertension: Secondary | ICD-10-CM | POA: Diagnosis not present

## 2017-09-06 LAB — URINALYSIS, ROUTINE W REFLEX MICROSCOPIC
Bilirubin Urine: NEGATIVE
Glucose, UA: NEGATIVE mg/dL
Hgb urine dipstick: NEGATIVE
KETONES UR: NEGATIVE mg/dL
LEUKOCYTES UA: NEGATIVE
Nitrite: NEGATIVE
PROTEIN: NEGATIVE mg/dL
Specific Gravity, Urine: 1.021 (ref 1.005–1.030)
pH: 5 (ref 5.0–8.0)

## 2017-09-06 NOTE — ED Triage Notes (Signed)
Reports pain and swelling in left testicle for a week.  Reports it feels like someone is hitting him in that area.

## 2017-09-07 ENCOUNTER — Emergency Department (HOSPITAL_COMMUNITY)
Admission: EM | Admit: 2017-09-07 | Discharge: 2017-09-07 | Disposition: A | Discharge status: Home / Self Care | Payer: 59 | Attending: Emergency Medicine | Admitting: Emergency Medicine

## 2017-09-07 DIAGNOSIS — N451 Epididymitis: Secondary | ICD-10-CM

## 2017-09-07 DIAGNOSIS — R609 Edema, unspecified: Secondary | ICD-10-CM

## 2017-09-07 DIAGNOSIS — N50819 Testicular pain, unspecified: Secondary | ICD-10-CM

## 2017-09-07 MED ORDER — DOXYCYCLINE HYCLATE 100 MG PO CAPS: 100.0000 mg | ORAL_CAPSULE | Freq: Two times a day (BID) | ORAL | 0 refills | Status: DC

## 2017-09-07 MED ORDER — STERILE WATER FOR INJECTION IJ SOLN
INTRAMUSCULAR | Status: AC
  Administered 2017-09-07: 1 mL
  Filled 2017-09-07: qty 10

## 2017-09-07 MED ORDER — CEFTRIAXONE SODIUM 250 MG IJ SOLR
250.0000 mg | Freq: Once | INTRAMUSCULAR | Status: AC
  Administered 2017-09-07: 250 mg via INTRAMUSCULAR
  Filled 2017-09-07: qty 250

## 2017-09-07 MED ORDER — ONDANSETRON 4 MG PO TBDP
4.0000 mg | ORAL_TABLET | Freq: Once | ORAL | Status: AC
  Administered 2017-09-07: 4 mg via ORAL
  Filled 2017-09-07: qty 1

## 2017-09-07 MED ORDER — AZITHROMYCIN 250 MG PO TABS
1000.0000 mg | ORAL_TABLET | Freq: Once | ORAL | Status: AC
  Administered 2017-09-07: 1000 mg via ORAL
  Filled 2017-09-07: qty 4

## 2017-09-07 NOTE — Discharge Instructions (Signed)
1. Medications: Doxycycline, usual home medications °2. Treatment: rest, drink plenty of fluids, use a condom with every sexual encounter °3. Follow Up: Please followup with your primary doctor in 3 days for discussion of your diagnoses and further evaluation after today's visit; if you do not have a primary care doctor use the resource guide provided to find one; Please return to the ER for worsening symptoms, high fevers or persistent vomiting. ° °You have been tested for chlamydia and gonorrhea.  These results will be available in approximately 3 days.  Please inform all sexual partners if you test positive for any of these diseases. ° °

## 2017-09-07 NOTE — ED Provider Notes (Signed)
[facility] EMERGENCY DEPARTMENT Provider Note   CSN: [id number] Arrival date & time: 09/06/17  2247     History   Chief Complaint Chief Complaint  Patient presents with  . Testicle Pain    HPI Eston Heslin is a 22 y.o. male with a hx of tricuspid atresia and stenosis, hypertension, migraine, pulmonary embolism, HIV presents to the Emergency Department complaining of gradual, persistent, progressively worsening left testicular pain onset 4 days ago.  Patient reports associated testicular swelling.  He denies fevers, chills, abdominal pain, nausea, vomiting or painful bowel movements.  Patient reports no sexual contacts in the last 3 months but vigorous masturbation on a regular basis.  Pt reports a Hx of Chlamydia, but cannot remember how long ago this happened.  Pt reports he was treated at that time.  Compliance with his HIV medications.  The history is provided by the patient and medical records. No language interpreter was used.    Past Medical History:  Diagnosis Date  . Cardiac anomaly, congenital   . Diarrhea 01/22/2017  . EBV infection    hx/notes 10/23/2016  . Hemoptysis 10/23/2016   [name] 10/23/2016  . Hypertension   . Migraine    "a few/year now; frequency is less than in the past" (10/23/2016)  . Pharyngitis 10/23/2016   notes 10/23/2016  . Pulmonary embolism (HCC) 01/29/2016  . S/P bidirectional Glenn shunt    hx/notes 10/23/2016  . S/P Fontan procedure    hx/notes 10/23/2016  . Tricuspid atresia and stenosis, congenital     Patient Active Problem List   Diagnosis Date Noted  . Diarrhea 01/22/2017  . Bleeding gums 11/02/2016  . Sinus tachycardia 11/02/2016  . Cognitive developmental delay 10/25/2016  . Pulmonary embolism (HCC) 10/24/2016  . HIV disease (HCC) 10/24/2016  . Chlamydial infection of pharynx 10/23/2016  . S/P Fontan procedure 10/23/2016  . S/P bidirectional Glenn shunt 10/23/2016  . Tricuspid atresia with normal great  arteries 10/23/2016  . Proteinuria 10/23/2016    Past Surgical History:  Procedure Laterality Date  . CARDIAC CATHETERIZATION  01/2016   at [facility]; hx/notes 10/23/2016  . CARDIAC SURGERY  01/1997; 2000   [facility]ane procedure [facility] as a child        Home Medications    Prior to Admission medications   Medication Sig Start Date End Date Taking? Authorizing Provider  bictegravir-emtricitabine-tenofovir AF (BIKTARVY) 50-200-25 MG TABS tablet Take 1 tablet by mouth daily. 05/16/17  Yes [name], Lisette Grinder, MD  darunavir-cobicistat (PREZCOBIX) 800-150 MG tablet Take 1 tablet by mouth daily with breakfast. Swallow whole. Do NOT crush, break or chew tablets. Take with food. 05/16/17  Yes [name], Lisette Grinder, MD  lisinopril (PRINIVIL,ZESTRIL) 5 MG tablet Take 5 mg by mouth daily. 07/24/17 07/24/18 Yes [provider]  aspirin EC 81 MG EC tablet Take 1 tablet (81 mg total) by mouth daily. Patient not taking: Reported on 05/05/2017 10/26/16   Reymundo Poll, MD  doxycycline (VIBRAMYCIN) 100 MG capsule Take 1 capsule (100 mg total) by mouth 2 (two) times daily. 09/07/17   Brithney Bensen, Dahlia Client, PA-C    Family History Family History  Problem Relation Age of Onset  . Hypertension Father     Social History Social History   Tobacco Use  . Smoking status: Never Smoker  . Smokeless tobacco: Never Used  . Tobacco comment: declined condoms  Substance Use Topics  . Alcohol use: No  . Drug use: No     Allergies  Patient has no known allergies.   Review of Systems Review of Systems  Constitutional: Negative for appetite change, diaphoresis, fatigue, fever and unexpected weight change.  HENT: Negative for mouth sores.   Eyes: Negative for visual disturbance.  Respiratory: Negative for cough, chest tightness, shortness of breath and wheezing.   Cardiovascular: Negative for chest pain.  Gastrointestinal: Negative for abdominal pain, constipation, diarrhea, nausea and vomiting.    Endocrine: Negative for polydipsia, polyphagia and polyuria.  Genitourinary: Positive for dysuria (mild) and testicular pain. Negative for frequency, hematuria and urgency.  Musculoskeletal: Negative for back pain and neck stiffness.  Skin: Negative for rash.  Allergic/Immunologic: Negative for immunocompromised state.  Neurological: Negative for syncope, light-headedness and headaches.  Hematological: Does not bruise/bleed easily.  Psychiatric/Behavioral: Negative for sleep disturbance. The patient is not nervous/anxious.      Physical Exam Updated Vital Signs BP (!) 151/75 (BP Location: Right Arm)   Pulse 89   Temp 98.3 F (36.8 C) (Oral)   Resp 16   Ht 6' (1.829 m)   Wt 79.4 kg (175 lb)   SpO2 96%   BMI 23.73 kg/m   Physical Exam  Constitutional: He appears well-developed and well-nourished. No distress.  Awake, alert, nontoxic appearance  HENT:  Head: Normocephalic and atraumatic.  Mouth/Throat: Oropharynx is clear and moist. No oropharyngeal exudate.  Eyes: Conjunctivae are normal. No scleral icterus.  Neck: Normal range of motion. Neck supple.  Cardiovascular: Normal rate, regular rhythm and intact distal pulses.  Pulmonary/Chest: Effort normal and breath sounds normal. No respiratory distress. He has no wheezes.  Equal chest expansion  Abdominal: Soft. Bowel sounds are normal. He exhibits no mass. There is no tenderness. There is no rebound and no guarding. Hernia confirmed negative in the right inguinal area and confirmed negative in the left inguinal area.  Genitourinary: Rectum normal, prostate normal and penis normal. Prostate is not enlarged and not tender. Right testis shows no mass, no swelling and no tenderness. Right testis is descended. Cremasteric reflex is not absent on the right side. Left testis shows swelling and tenderness. Left testis shows no mass. Left testis is descended. Cremasteric reflex is not absent on the left side. Circumcised. No phimosis,  paraphimosis, hypospadias, penile erythema or penile tenderness. No discharge found.  Genitourinary Comments: Chaperone present  Musculoskeletal: Normal range of motion. He exhibits no edema.  Lymphadenopathy: No inguinal adenopathy noted on the right or left side.  Neurological: He is alert.  Speech is clear and goal oriented Moves extremities without ataxia  Skin: Skin is warm and dry. He is not diaphoretic.  Psychiatric: He has a normal mood and affect.  Nursing note and vitals reviewed.    ED Treatments / Results  Labs (all labs ordered are listed, but only abnormal results are displayed) Labs Reviewed  URINALYSIS, ROUTINE W REFLEX MICROSCOPIC  GC/CHLAMYDIA PROBE AMP ([place]) NOT AT [facility]    Radiology US Scrotum W/doppler  Result Date: 09/07/2017 CLINICAL DATA:  Left testicle pain and swelling EXAM: SCROTAL ULTRASOUND DOPPLER ULTRASOUND OF THE TESTICLES TECHNIQUE: Complete ultrasound examination of the testicles, epididymis, and other scrotal structures was performed. Color and spectral Doppler ultrasound were also utilized to evaluate blood flow to the testicles. COMPARISON:  CT abdomen and pelvis 01/28/2016 FINDINGS: Right testicle Measurements: 3.3 x 2.7 x 2.2 cm. No mass or microlithiasis visualized. Left testicle Measurements: 3.5 x 2.1 x 3 cm. No mass or microlithiasis visualized. Right epididymis:  Normal in size and appearance. Left epididymis:  Slightly enlarged.  Hyperemic. Hydrocele:  None visualized. Varicocele:  Small bilateral varicoceles. Pulsed Doppler interrogation of both testes demonstrates normal low resistance arterial and venous waveforms bilaterally. IMPRESSION: 1. Negative for testicular torsion 2. Slightly enlarged hyperemic left epididymis suggesting epididymitis 3. Small bilateral varicoceles Electronically Signed   By: Jasmine PangKim  Fujinaga M.D.   On: 09/07/2017 00:15    Procedures Procedures (including critical care time)  Medications Ordered in  ED Medications  azithromycin (ZITHROMAX) tablet 1,000 mg (not administered)  ondansetron (ZOFRAN-ODT) disintegrating tablet 4 mg (not administered)  cefTRIAXone (ROCEPHIN) injection 250 mg (not administered)  sterile water (preservative free) injection (not administered)     Initial Impression / Assessment and Plan / ED Course  I have reviewed the triage vital signs and the nursing notes.  Pertinent labs & imaging results that were available during my care of the patient were reviewed by me and considered in my medical decision making (see chart for details).     Testicular exam consistent with epididymitis.  Confirmed with ultrasound.  No evidence of testicular torsion.  Prostate is not tender or boggy.  No evidence of prostatitis.  Patient is afebrile without abdominal pain.  We will treat for possible STD and discharged home on doxycycline.  Long discussion with patient and mother.  He will have close primary care follow-up next week.  Discussed reasons to return immediately to the emergency department.  They state understanding and are in agreement with the plan.  Final Clinical Impressions(s) / ED Diagnoses   Final diagnoses:  Testicular pain  Epididymitis    ED Discharge Orders        Ordered    doxycycline (VIBRAMYCIN) 100 MG capsule  2 times daily     09/07/17 0139       Leea Rambeau, Dahlia ClientHannah, PA-C 09/07/17 0140    Geoffery Lyonselo, Douglas, MD 09/07/17 [phone]

## 2017-09-08 LAB — GC/CHLAMYDIA PROBE AMP (~~LOC~~) NOT AT ARMC
Chlamydia: NEGATIVE
NEISSERIA GONORRHEA: POSITIVE — AB

## 2017-09-15 ENCOUNTER — Telehealth: Payer: Self-pay | Admitting: *Deleted

## 2017-09-15 NOTE — Telephone Encounter (Signed)
Rn received a message from Ms. Matassa asking if I could give her a call. Responded to the message stating "Sure! Are your free"  Received a response back stating 'I am now" I called Ms Glennie and it went straight to VM. Message left stating my name and that I am following up with her. Asked for a return call.

## 2017-09-23 ENCOUNTER — Telehealth: Payer: Self-pay | Admitting: *Deleted

## 2017-09-23 NOTE — Telephone Encounter (Signed)
Received a message from Micheal Leonard's mom concerned about her son and stated he could benefit from a support group to discuss his positive status and being a MSM. [facility] and [facility] to see if we have a local  Young MSM support group that may be able to support, relate and encourage Micheal Leonard

## 2017-09-24 ENCOUNTER — Telehealth: Payer: Self-pay | Admitting: *Deleted

## 2017-09-24 NOTE — Telephone Encounter (Signed)
[name] thanks for engaging with Micheal Leonard and his Mom!

## 2017-09-24 NOTE — Telephone Encounter (Signed)
Hopefully we can leverage the U=U undetectable = untransmissable part of this.  The ONLY sure way to prevent HIV transmission here is to make sure he takes his meds.  I doubt we can do much interventions w re to a 22 year old man ( and one who has some cognitive problems) sex drive.   Best to focus on the meds being taken religiously.

## 2017-09-24 NOTE — Telephone Encounter (Signed)
I have been speaking with Ms. Micheal Leonard via text and her concern in that she and her son need  to speak with a counselor. From looking at [name]'s chart it appears he has been struggling with possible hyperactive sexuality and unprotected sex.  Our new counselor(Janet) is booked and may not offer family counseling along with individual counseling. Spoke with [facility]/Amy and [facility]/Mitch and we currently do not offer a support group for young MSM. Mitch suggested [name]. I contacted [facility] but they do not accept [name]'s insurance and referred me to [facility]/[name]. Contacted Angel and she is willing to do both Family and Individual Counseling and accept the patient's insurance. Information for [facility] given to the mom.

## 2017-09-24 NOTE — Telephone Encounter (Signed)
You are so right, great points!

## 2017-09-30 ENCOUNTER — Telehealth: Payer: Self-pay | Admitting: *Deleted

## 2017-09-30 NOTE — Telephone Encounter (Addendum)
Orignial Email received from [employer] stated:  Micheal SidleHey guys!  Micheal Leonard 26-Dec-1995. Last month when the techs called him he told them he had 19 tablets of HIV meds remaining, so they did not fill. This month he reports having 27 on hand. We haven't filled since 1/28.   [name], PharmD Clinical Pharmacist [facility] [phone]  [name] sent me a email stating:  [name] Have you seen him recently? Pharmacy was concern about his refill issue.  Ulyses SouthwardMinh Pham, PharmD, BCPS, AAHIVP, CPP Infectious Disease Pharmacist [place]  I replied stating: Yes I am concerned too. His mom gave me a call last week concerned about him as well. She wanted to know about counseling for herself and her son. So I researched and referred her to some counseling options last week that she was going to follow up on. I will reach out her again and make her aware that we are concerned about his adherence as well.    Contacted ms. Zale stating I wanted to f/u on the counseling and just as a FYI the pharmacy and myself are concerned about [name] adherence with his medications. I just wanted to be sure you were aware that he may not be taking his medications.

## 2017-10-01 ENCOUNTER — Telehealth: Payer: Self-pay | Admitting: *Deleted

## 2017-10-01 ENCOUNTER — Ambulatory Visit: Payer: Self-pay | Admitting: *Deleted

## 2017-10-01 DIAGNOSIS — B20 Human immunodeficiency virus [HIV] disease: Secondary | ICD-10-CM
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
## 2017-10-10 MED FILL — PREZCOBIX 800 MG-150 MG TAB: 800-150 | 30 days supply | Qty: 30 | Fill #3

## 2017-10-10 MED FILL — BIKTARVY 50-200-25 MG TABS: 50-200-25 | 30 days supply | Qty: 30 | Fill #3

## 2017-10-13 ENCOUNTER — Telehealth: Payer: Self-pay | Admitting: *Deleted

## 2017-10-13 NOTE — Telephone Encounter (Signed)
Received a text message from Micheal Leonard at 2:30AM stating he wondered if I could come by to talk. I responded and made him aware that I will be off until next week but if something is wrong to please let me know. Micheal Leonard stated everything his fine he just wanted to discuss something he has been feeling recently. We planned to meet on Monday(4/8) which is the 1st day that I return to work.

## 2017-10-20 ENCOUNTER — Telehealth: Payer: Self-pay | Admitting: *Deleted

## 2017-10-20 ENCOUNTER — Ambulatory Visit: Payer: 59 | Admitting: *Deleted

## 2017-10-20 ENCOUNTER — Ambulatory Visit (INDEPENDENT_AMBULATORY_CARE_PROVIDER_SITE_OTHER): Payer: 59 | Admitting: Licensed Clinical Social Worker

## 2017-10-20 DIAGNOSIS — B20 Human immunodeficiency virus [HIV] disease: Secondary | ICD-10-CM

## 2017-10-20 DIAGNOSIS — F4323 Adjustment disorder with mixed anxiety and depressed mood: Secondary | ICD-10-CM

## 2017-10-20 NOTE — Telephone Encounter (Signed)
Patient walked in to meet [name] McNeil, RN, also followed up with Regina Alexander, LCSW. He is requesting a note excusing him from work for the rest of the day (his shift starts at 2pm). Patient states he was anxious about everything going on, had nausea/vomiting episode this morning because of it. He is feeling better since he met with [name] and Regina, but is unsure if he will be able to keep food down later today. RN wrote note excusing him for today. He will return tomorrow. Howell, Michelle M, RN  

## 2017-10-20 NOTE — Telephone Encounter (Signed)
Thanks MIchelle! 
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
## 2017-10-20 NOTE — BH Specialist Note (Incomplete)
I

## 2017-10-23 ENCOUNTER — Ambulatory Visit: Payer: Self-pay | Admitting: *Deleted

## 2017-10-23 DIAGNOSIS — B20 Human immunodeficiency virus [HIV] disease: Secondary | ICD-10-CM

## 2017-10-23 DIAGNOSIS — F4323 Adjustment disorder with mixed anxiety and depressed mood: Secondary | ICD-10-CM
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
## 2017-10-27 NOTE — Telephone Encounter (Addendum)
Order received on 09/30/17 by Dr Ulyses JarredVan Dam/Tammy King RN to evaluate [facility]  In home visit made on 10/01/17 and evaluation completed with consent to care signed at this time.   Frequency / Duration of [facility] visits: Effective: 10/01/17: 1041mo1 302mo3, 4 PRN's for complications with disease process/progression, medication changes or concerns   [facility] will assess for learning needs related to diagnosis and treatment regimen, provide education as needed, fill pill box if needed, address any barriers which may be preventing or posing a risk to medication compliance, and communicating with care team including physician and case manager. Focus of each visit with be keeping the patient in care, medications access and assistance with removing potential or current barriers to care.  Individualized Plan Of Care 10/01/17 to 12/30/17  a. Type of service(s) and care to be delivered: RN Case Management  b. Frequency and duration of service: Effective 10/01/17: 61mo1, 762mo3, 4 prns for complications with disease process/progression, medication changes or concerns .   Visits/Contact may be conducted telephonically or in person to best suit the patient.  c. Activity restrictions: Pt may be up as tolerated and can safely ambulate without the need for a assistive device   d. Safety Measures: Standard Precautions/Infection Control  And Disease Management  e. Service Objectives and Goals: Service Objectives are to assist the pt with HIV medication regimen adherence and staying in care with the Infectious Disease Clinic by identifying barriers to care. RN  will address the barriers that are identified by the patient. Currently the patient states his goals would be to have a 1 pill regimen.  RN's focus will be on keeping the stated goals at the center of the plan of  care and explaining the patinet how to successful reach his goals with behavioral changes to support his goals.  Patient struggles with adherence when he feels unsupported.   f. Equipment required: No additional equipment needs at this time   g. Functional Limitations: Cognitive delay but functional  h. Rehabilitation potential: Guarded   i. Diet and Nutritional Needs: Regular Diet   j. Medications and treatments: Medications have been reconciled and reviewed and are a part of EPIC electronic file   k. Specific therapies if needed: RN   l. Pertinent diagnoses: New HIV disease   m. Expected outcome: Guarded

## 2017-10-27 NOTE — Telephone Encounter (Signed)
I approve of her plan of care

## 2017-10-28 ENCOUNTER — Ambulatory Visit: Payer: Self-pay | Admitting: *Deleted

## 2017-10-28 VITALS — BP 132/82 | HR 90 | Temp 98.2°F

## 2017-10-28 DIAGNOSIS — B20 Human immunodeficiency virus [HIV] disease: Secondary | ICD-10-CM

## 2017-10-28 DIAGNOSIS — F4323 Adjustment disorder with mixed anxiety and depressed mood: Secondary | ICD-10-CM

## 2017-10-30 NOTE — Progress Notes (Signed)
At this time Micheal Leonard discussed some of his concerns and plans for his life. He states he would love to go to Medical school even though that is a second choice for him. Micheal Leonard states his first choice has always been the [employer] but he is terrified that the may not receive a high enough score of military placement test. During this meeting we also discussed his challenges with medication adherence. Micheal Leonard stated about 2 weeks ago he was feeling really lost and did not take his medications faithfully. He states he is know taking his medications as prescribed and would like to be considered for a one pill regimen. Explained to Micheal Leonard the pros and cons of one pill regimens when struggling with adherence. Micheal Leonard verbalized understanding of education provided

## 2017-10-30 NOTE — Progress Notes (Signed)
Received a call from Micheal Leonard yesterday stating he really needs someone to talk too. He agreed to a visit today. At this time he stated he has been overwhelmed with the thought of having to take medication for the rest of his life and feels it may not be worth it. Micheal Leonard discussed his struggles as a child and stated currently he would prefer to not be labeled as anything. 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 After actively listening to Micheal Leonard he shared that he could benefit from counselor services but would prefer a male counselor.   After the visit I researched different options in the community that can provide a male counselor. Currently [facility] is not accepting new patients but I was able to link him with Micheal Leonard. Micheal Leonard stated Micheal Leonard can give his office a call tomorrow morning and speak with Micheal Leonard at that time Micheal Leonard can schedule the appointment with one of their male counselors. Contacted Micheal Leonard and gave him this information. Micheal Leonard agreed to call tomorrow morning to arrange his appointment.

## 2017-11-07 ENCOUNTER — Telehealth: Payer: Self-pay | Admitting: Pharmacist Clinician (PhC)/ Clinical Pharmacy Specialist

## 2017-11-07 NOTE — Telephone Encounter (Signed)
Micheal Leonard is off the adherence wagon again. He is now off of meds for 3 wks. Talked to his mom first and she cried because she didn't know what to do with him anymore. Encouraged her to help as much as she can. He was blaming the antiretrovirals for causing his mental thoughts. Repeatedly, told him that it's not the case. He probably will need a referral to psych. He will restart today and come back to see me in 2 wks.

## 2017-11-07 NOTE — Telephone Encounter (Signed)
Micheal Leonard is he on INSTII? We could put him on SYMTUZA maybe just in case the insti has anything to do with this. I bet it doesn't but just a thought

## 2017-11-27 ENCOUNTER — Other Ambulatory Visit (HOSPITAL_COMMUNITY)
Admission: RE | Admit: 2017-11-27 | Discharge: 2017-11-27 | Disposition: A | Discharge status: Home / Self Care | Payer: 59 | Source: Ambulatory Visit | Attending: Infectious Disease | Admitting: Infectious Disease

## 2017-11-27 ENCOUNTER — Ambulatory Visit (INDEPENDENT_AMBULATORY_CARE_PROVIDER_SITE_OTHER): Payer: 59 | Admitting: Pharmacist Clinician (PhC)/ Clinical Pharmacy Specialist

## 2017-11-27 DIAGNOSIS — B20 Human immunodeficiency virus [HIV] disease: Secondary | ICD-10-CM

## 2017-11-27 MED ORDER — DARUN-COBIC-EMTRICIT-TENOFAF 800-150-200-10 MG PO TABS: 1.0000 | ORAL_TABLET | Freq: Every day | ORAL | 5 refills | Status: DC

## 2017-11-27 NOTE — Progress Notes (Signed)
HPI: Micheal Leonard is a 22 y.o. male who is here for his HIV visit with pharmacy.   Allergies: No Known Allergies  Vitals:    Past Medical History: Past Medical History:  Diagnosis Date  . Cardiac anomaly, congenital   . Diarrhea 01/22/2017  . EBV infection    hx/notes 10/23/2016  . Hemoptysis 10/23/2016   [name] 10/23/2016  . Hypertension   . Migraine    "a few/year now; frequency is less than in the past" (10/23/2016)  . Pharyngitis 10/23/2016   notes 10/23/2016  . Pulmonary embolism (HCC) 01/29/2016  . S/P bidirectional Glenn shunt    hx/notes 10/23/2016  . S/P Fontan procedure    hx/notes 10/23/2016  . Tricuspid atresia and stenosis, congenital     Social History: Social History   Socioeconomic History  . Marital status: Single    Spouse name: Not on file  . Number of children: Not on file  . Years of education: Not on file  . Highest education level: Not on file  Occupational History  . Not on file  Social Needs  . Financial resource strain: Not on file  . Food insecurity:    Worry: Not on file    Inability: Not on file  . Transportation needs:    Medical: Not on file    Non-medical: Not on file  Tobacco Use  . Smoking status: Never Smoker  . Smokeless tobacco: Never Used  . Tobacco comment: declined condoms  Substance and Sexual Activity  . Alcohol use: No  . Drug use: No  . Sexual activity: Yes  Lifestyle  . Physical activity:    Days per week: Not on file    Minutes per session: Not on file  . Stress: Not on file  Relationships  . Social connections:    Talks on phone: Not on file    Gets together: Not on file    Attends religious service: Not on file    Active member of club or organization: Not on file    Attends meetings of clubs or organizations: Not on file    Relationship status: Not on file  Other Topics Concern  . Not on file  Social History Narrative  . Not on file    Previous Regimen: Genvoya  Current  Regimen: Prezcobix/Biktarvy  Labs: HIV 1 RNA Quant (copies/mL)  Date Value  08/05/2017 <20 DETECTED (A)  06/30/2017 <20 NOT DETECTED  05/05/2017 447 (H)   CD4 T Cell Abs (/uL)  Date Value  08/05/2017 300 (L)  05/05/2017 300 (L)  01/22/2017 450   Hepatitis B Surface Ag (no units)  Date Value  10/25/2016 Negative   HCV Ab (s/co ratio)  Date Value  01/29/2016 0.1    CrCl: CrCl cannot be calculated (Patient's most recent lab result is older than the maximum 21 days allowed.).  Lipids:    Component Value Date/Time   CHOL 203 (H) 01/22/2017 1051   TRIG 86 01/22/2017 1051   HDL 58 01/22/2017 1051   CHOLHDL 3.5 01/22/2017 1051   VLDL 17 01/22/2017 1051   LDLCALC 128 (H) 01/22/2017 1051    Assessment: Urias had a lapse in his adherence in mid April. He was off of therapy for about 3 wks. He didn't give any good reason of why. He mentioned some mental side effects. Dr.Van Dam is ok with just using Symtuza by itself. He was whining about too many tablets even though he only takes 2 tablet right now. Encouraged him to be  good on adherence to prevent resistance to Symtuza. He denied missing any doses since the lapse. His VL should be good again then. [facility] can fill and will ship today.   He was positive for gonorrhea last time and was treated with Roc/Azith. He denied any STD symptoms and sexual encounters since the last visit. We will re-swab him anyway.   Recommendations:  HIV labs today Stop Prezcobix/Biktarvy Start Symtuza 1 daily F/u with Dr. [name] in June  [name], PharmD, BCPS, AAHIVP, CPP Clinical Infectious Disease Pharmacist [facility] 11/27/2017, 2:24 PM

## 2017-11-28 LAB — URINE CYTOLOGY ANCILLARY ONLY
Chlamydia: NEGATIVE
Neisseria Gonorrhea: NEGATIVE

## 2017-11-28 LAB — RPR: RPR Ser Ql: NONREACTIVE

## 2017-11-28 LAB — CYTOLOGY, (ORAL, ANAL, URETHRAL) ANCILLARY ONLY
CHLAMYDIA, DNA PROBE: NEGATIVE
Chlamydia: NEGATIVE
NEISSERIA GONORRHEA: NEGATIVE
Neisseria Gonorrhea: NEGATIVE

## 2017-11-28 LAB — T-HELPER CELL (CD4) - (RCID CLINIC ONLY)
CD4 T CELL ABS: 290 /uL — AB (ref 400–2700)
CD4 T CELL HELPER: 17 % — AB (ref 33–55)

## 2017-11-28 MED FILL — SYMTUZA 800-150-200-10 MG T: 800-150-200 | 30 days supply | Qty: 30 | Fill #0

## 2017-12-01 LAB — HIV-1 RNA QUANT-NO REFLEX-BLD
HIV 1 RNA Quant: <20 copies/mL — AB
HIV-1 RNA QUANT, LOG: DETECTED {Log_copies}/mL — AB

## 2017-12-22 ENCOUNTER — Other Ambulatory Visit: Payer: 59

## 2017-12-29 MED FILL — SYMTUZA 800-150-200-10 MG T: 800-150-200 | 30 days supply | Qty: 30 | Fill #1

## 2017-12-31 ENCOUNTER — Telehealth: Payer: Self-pay | Admitting: *Deleted

## 2017-12-31 NOTE — Telephone Encounter (Signed)
RN contacted the patient today. Purpose of the call is to stay connected with the patient. RN left a message stating that I wanted to be sure all is well and to please let me know if I can assist in any way.  

## 2017-12-31 NOTE — Telephone Encounter (Signed)
PATIENT ON HOLD  Plan of Care orders have expired effective 12/31/17 but GOALS have not been completely meet at this time. I would like to connect with the patient first and then request further orders from MD. If I am unable to get in contact with him within the next 30 days I will have to discharge at that time. RN WILL NOT RESUME CARE UNTIL NEW MD ORDERS OBTAINED AND PATIENT ABLE/WILLING TO RE-ENGAGE IN CARE  

## 2018-01-05 ENCOUNTER — Ambulatory Visit: Payer: 59 | Admitting: Infectious Disease

## 2018-01-11 ENCOUNTER — Encounter (HOSPITAL_COMMUNITY): Payer: Self-pay | Admitting: Emergency Medicine

## 2018-01-11 ENCOUNTER — Emergency Department (HOSPITAL_COMMUNITY)
Admission: EM | Admit: 2018-01-11 | Discharge: 2018-01-11 | Disposition: A | Discharge status: Home / Self Care | Payer: 59 | Attending: Emergency Medicine | Admitting: Emergency Medicine

## 2018-01-11 DIAGNOSIS — L0291 Cutaneous abscess, unspecified: Secondary | ICD-10-CM | POA: Diagnosis present

## 2018-01-11 DIAGNOSIS — I10 Essential (primary) hypertension: Secondary | ICD-10-CM | POA: Diagnosis not present

## 2018-01-11 DIAGNOSIS — L02411 Cutaneous abscess of right axilla: Secondary | ICD-10-CM | POA: Insufficient documentation

## 2018-01-11 DIAGNOSIS — Z79899 Other long term (current) drug therapy: Secondary | ICD-10-CM | POA: Diagnosis not present

## 2018-01-11 MED ORDER — LIDOCAINE-EPINEPHRINE (PF) 2 %-1:200000 IJ SOLN
10.0000 mL | Freq: Once | INTRAMUSCULAR | Status: AC
  Administered 2018-01-11: 10 mL via INTRADERMAL
  Filled 2018-01-11: qty 20

## 2018-01-11 MED ORDER — DOXYCYCLINE HYCLATE 100 MG PO CAPS: 100.0000 mg | ORAL_CAPSULE | Freq: Two times a day (BID) | ORAL | 0 refills | Status: DC

## 2018-01-11 NOTE — Discharge Instructions (Signed)
You were seen in the emergency department today for an abscess to your right armpit area.  The abscess was incised and drained.  The left armpit area had a possible abscess which was not incised and drained.  Please apply warm compresses for 15 minutes 6 times per day.  We are also placing you on an antibiotic, doxycycline, to assist in treating the infection. Please take all of your antibiotics until finished. You may develop abdominal discomfort or diarrhea from the antibiotic.  You may help offset this with probiotics which you can buy at the store (ask your pharmacist if unable to find) or get probiotics in the form of eating yogurt. Do not eat or take the probiotics until 2 hours after your antibiotic. If you are unable to tolerate these side effects follow-up with your primary care provider or return to the emergency department.   If you begin to experience any blistering, rashes, swelling, or difficulty breathing seek medical care for evaluation of potentially more serious side effects.   Please be aware that this medication may interact with other medications you are taking, please be sure to discuss your medication list with your pharmacist.   Take Motrin and/or Tylenol per over-the-counter dosing for pain.  Please return to the ER, see your primary care provider, or go to an urgent care for a recheck of this area in 48 hours.  Return to the ER sooner for new or worsening symptoms including but not limited to worsening pain, spreading redness, fever, vomiting, or any other concerns.

## 2018-01-11 NOTE — ED Provider Notes (Signed)
[facility] EMERGENCY DEPARTMENT Provider Note   CSN: [id number] Arrival date & time: 01/11/18  2140     History   Chief Complaint Chief Complaint  Patient presents with  . Abscess    HPI Micheal Leonard is a 22 y.o. male with a hx of pulmonary embolism, congenital, tricuspid atresia and stenosis, HTN, and HIV (compliant on symtuza) who presents to the ED for abscess to R axillary area x 4 days. Patient states the area to R axilla is painful, swollen, and red. No expressible drainage. Progressively worsening. He also has two somewhat similar areas to his L axilla that are not painful and are actually improving, these have been present similar period of time. No specific alleviating/aggravating factors. No meds PTA. No hx of similar. He has shaved these areas within past 1 month. Denies fever, chills, nausea, or vomiting. Last tetanus within past 5 years.   HPI  Past Medical History:  Diagnosis Date  . Cardiac anomaly, congenital   . Diarrhea 01/22/2017  . EBV infection    hx/notes 10/23/2016  . Hemoptysis 10/23/2016   [name] 10/23/2016  . Hypertension   . Migraine    "a few/year now; frequency is less than in the past" (10/23/2016)  . Pharyngitis 10/23/2016   notes 10/23/2016  . Pulmonary embolism (HCC) 01/29/2016  . S/P bidirectional Glenn shunt    hx/notes 10/23/2016  . S/P Fontan procedure    hx/notes 10/23/2016  . Tricuspid atresia and stenosis, congenital     Patient Active Problem List   Diagnosis Date Noted  . Diarrhea 01/22/2017  . Bleeding gums 11/02/2016  . Sinus tachycardia 11/02/2016  . Cognitive developmental delay 10/25/2016  . Pulmonary embolism (HCC) 10/24/2016  . HIV disease (HCC) 10/24/2016  . Chlamydial infection of pharynx 10/23/2016  . S/P Fontan procedure 10/23/2016  . S/P bidirectional Glenn shunt 10/23/2016  . Tricuspid atresia with normal great arteries 10/23/2016  . Proteinuria 10/23/2016    Past Surgical History:    Procedure Laterality Date  . CARDIAC CATHETERIZATION  01/2016   at [facility]; hx/notes 10/23/2016  . CARDIAC SURGERY  01/1997; 2000   fontane procedure at [facility] as a child         Home Medications    Prior to Admission medications   Medication Sig Start Date End Date Taking? Authorizing Provider  Darunavir-Cobicisctat-Emtricitabine-Tenofovir Alafenamide [facility]) 800-150-200-10 MG TABS Take 1 tablet by mouth daily with breakfast. 11/27/17   [name], Lisette Grinder, MD    Family History Family History  Problem Relation Age of Onset  . Hypertension Father     Social History Social History   Tobacco Use  . Smoking status: Never Smoker  . Smokeless tobacco: Never Used  . Tobacco comment: declined condoms  Substance Use Topics  . Alcohol use: No  . Drug use: No     Allergies   Patient has no known allergies.   Review of Systems Review of Systems  Constitutional: Negative for chills and fever.  Skin: Positive for wound.  Neurological: Negative for weakness and numbness.     Physical Exam Updated Vital Signs BP (!) 143/89 (BP Location: Right Arm)   Pulse (!) 106   Temp 98.5 F (36.9 C) (Oral)   Resp 17   Ht 6' (1.829 m)   Wt 79.4 kg (175 lb)   SpO2 100%   BMI 23.73 kg/m   Physical Exam  Constitutional: He appears well-developed and well-nourished. No distress.  HENT:  Head: Normocephalic and atraumatic.  Eyes: Conjunctivae are normal. Right eye exhibits no discharge. Left eye exhibits no discharge.  Cardiovascular: Normal rate and regular rhythm.  2+ symmetric radial pulses.  Pulmonary/Chest: Effort normal and breath sounds normal.  Musculoskeletal:  Patient has normal range of motion to bilateral shoulders and elbows.  No bony tenderness.  Neurological: He is alert.  Clear speech.   Skin:  Right axilla: There is a 2 cm x 1cm area of palpable fluctuance to superior axilla.  This area is erythematous, slightly warm.  No active drainage.  There is surrounding  induration.  There is no streaking type erythema that extends past the axillary region. Left axilla: There is a 1.5 cm area of induration to the central axilla.  There is an additional 1 cm area of induration superior to other area.  Induration is not continuous.  These areas are nontender to palpation.  No appreciable fluctuance on exam.  No overlying erythema or warmth.  No streaking.  Psychiatric: He has a normal mood and affect. His behavior is normal. Thought content normal.  Nursing note and vitals reviewed.    ED Treatments / Results  Labs (all labs ordered are listed, but only abnormal results are displayed) Labs Reviewed - No data to display  EKG None  Radiology No results found.  Procedures EMERGENCY DEPARTMENT US SOFT TISSUE INTERPRETATION "Study: Limited Soft Tissue Ultrasound"  INDICATIONS: Soft tissue infection Multiple views of the body part were obtained in real-time with a multi-frequency linear probe  PERFORMED BY: Myself IMAGES ARCHIVED?: No SIDE:Right  BODY PART:Axilla INTERPRETATION:  Abcess present   EMERGENCY DEPARTMENT US SOFT TISSUE INTERPRETATION "Study: Limited Soft Tissue Ultrasound"  INDICATIONS: Soft tissue infection Multiple views of the body part were obtained in real-time with a multi-frequency linear probe  PERFORMED BY: Myself IMAGES ARCHIVED?: No SIDE:Left BODY PART:Axilla INTERPRETATION:  Patient has two separate areas of induration that have cobblestoning on ultrasound. There is a very small appreciable fluid pocket to lower area.   Marland Kitchen.Incision and Drainage Date/Time: 01/11/2018 11:30 PM Performed by: Cherly Anderson, PA-C Authorized by: Cherly Anderson, PA-C   Consent:    Consent obtained:  Verbal   Consent given by:  Patient   Risks discussed:  Incomplete drainage, bleeding, damage to other organs, infection and pain   Alternatives discussed:  No treatment and alternative treatment Location:    Type:  Abscess    Size:  2.5 cm   Location:  Upper extremity   Upper extremity location: R axilla. Pre-procedure details:    Skin preparation:  Betadine Anesthesia (see MAR for exact dosages):    Anesthesia method:  Local infiltration   Local anesthetic:  Lidocaine 2% WITH epi Procedure type:    Complexity:  Simple Procedure details:    Incision types:  Stab incision   Scalpel blade:  11   Wound management:  Probed and deloculated and irrigated with saline   Drainage:  Bloody and purulent   Drainage amount:  Moderate   Wound treatment:  Wound left open   Packing materials:  None Post-procedure details:    Patient tolerance of procedure:  Tolerated well, no immediate complications   (including critical care time)  Medications Ordered in ED Medications  lidocaine-EPINEPHrine (XYLOCAINE W/EPI) 2 %-1:200000 (PF) injection 10 mL (10 mLs Intradermal Given 01/11/18 2230)     Initial Impression / Assessment and Plan / ED Course  I have reviewed the triage vital signs and the nursing notes.  Pertinent labs & imaging results that were available  during my care of the patient were reviewed by me and considered in my medical decision making (see chart for details).   Patient with hx of HIV presents with abscess with surrounding cellulitis to right axilla as well as induration and possible small abscess to left axilla.  Vitals WNL other than initial tachycardia and elevated BP- normalized on my exam and with repeat vitals, afebrile. Right axillary region is tender to palpation, left axillary region is not.  I discussed incision and drainage procedure bilaterally, patient states he would only like this performed to right axillary region given the left axillary areas have been improving without intervention and are nonpainful. Discussed risks/benefits/alternatives. I&D of R axillay abscess per procedure note above. Patient tolerated procedure well. Given surrounding cellulitis will place on doxycycline. Warm  compresses/flushes advised. Wound recheck in 2 days. I discussed  treatment plan, need for follow-up, and return precautions with the patient and his mother at bedside. Provided opportunity for questions, patient and his mother confirmed understanding and are in agreement with plan.   Vitals:   01/11/18 2145 01/11/18 2346  BP: (!) 143/89 137/86  Pulse: (!) 106 72  Resp: 17 16  Temp: 98.5 F (36.9 C) 98.3 F (36.8 C)  SpO2: 100% 100%      Final Clinical Impressions(s) / ED Diagnoses   Final diagnoses:  Abscess of axilla, right    ED Discharge Orders        Ordered    doxycycline (VIBRAMYCIN) 100 MG capsule  2 times daily     01/11/18 2335       Jenifer Struve, Pleas KochSamantha R, PA-C 01/12/18 0104    Benjiman CorePickering, Nathan, MD 01/12/18 1556

## 2018-01-11 NOTE — ED Triage Notes (Signed)
Patient to ED c/o abscess to R axilla x 3 days. Denies fevers/chills.

## 2018-01-23 ENCOUNTER — Encounter: Payer: Self-pay | Admitting: Infectious Disease

## 2018-01-23 ENCOUNTER — Other Ambulatory Visit (HOSPITAL_COMMUNITY)
Admission: RE | Admit: 2018-01-23 | Discharge: 2018-01-23 | Disposition: A | Discharge status: Home / Self Care | Payer: 59 | Source: Ambulatory Visit | Attending: Infectious Disease | Admitting: Infectious Disease

## 2018-01-23 ENCOUNTER — Ambulatory Visit (INDEPENDENT_AMBULATORY_CARE_PROVIDER_SITE_OTHER): Payer: 59 | Admitting: Infectious Disease

## 2018-01-23 VITALS — Wt 196.0 lb

## 2018-01-23 DIAGNOSIS — B20 Human immunodeficiency virus [HIV] disease: Secondary | ICD-10-CM | POA: Diagnosis not present

## 2018-01-23 DIAGNOSIS — Z23 Encounter for immunization: Secondary | ICD-10-CM | POA: Diagnosis not present

## 2018-01-23 DIAGNOSIS — Z9889 Other specified postprocedural states: Secondary | ICD-10-CM | POA: Diagnosis not present

## 2018-01-23 DIAGNOSIS — Z113 Encounter for screening for infections with a predominantly sexual mode of transmission: Secondary | ICD-10-CM | POA: Diagnosis not present

## 2018-01-23 DIAGNOSIS — F819 Developmental disorder of scholastic skills, unspecified: Secondary | ICD-10-CM

## 2018-01-23 DIAGNOSIS — A564 Chlamydial infection of pharynx: Secondary | ICD-10-CM

## 2018-01-23 DIAGNOSIS — Z79899 Other long term (current) drug therapy: Secondary | ICD-10-CM

## 2018-01-23 NOTE — Addendum Note (Signed)
Addended by: Andree CossHOWELL, Ziggy Reveles M on: 01/23/2018 02:32 PM   Modules accepted: Orders

## 2018-01-23 NOTE — Addendum Note (Signed)
Addended by: Lorenso CourierMALDONADO, Shila Kruczek L on: 01/23/2018 11:46 AM   Modules accepted: Orders

## 2018-01-23 NOTE — Progress Notes (Signed)
Subjective:   Chief complaint: follow-up for HIV disease on meds.   Patient ID: Micheal Leonard, male    DOB: [birth year], 22 y.o.   MRN: [id number]  HPI 22 year old with hx of cognitive delay, congenital heart disease sp [facility] now admitted with acute HIV syndrome with cervical LA, fevers, and pharyngitis. He had workp that included HIV which was + with VL of half a million. CD4 was 180 but I suspect acutely suppressed in context of his infection as he had tested negative for HIV in the summer.   He was on [place] and Descovy and suppressed virus but had biilateral rash on arms and loose stools and then change to Genvoya after BIKTARVY was not OK with insurance.  We were worried about him having risk for VF with resistance and added Prezcobix to BIKTARVY. He did not like the symptoms he was having on this and taking 2 tablets. [facility].   He was suppressed at that time.  He has been feeling his [facility] are [facility]  He is sexually active and has 1 boyfriend who is with in particular.  We will screen for STIs today and have also given him healthy sexual condoms from [facility] as well as promotional information with regards [facility].          Past Medical History:  Diagnosis Date  . Cardiac anomaly, congenital   . Diarrhea 01/22/2017  . EBV infection    hx/notes 10/23/2016  . Hemoptysis 10/23/2016   [name]/notes 10/23/2016  . Hypertension   . Migraine    "a few/year now; frequency is less than in the past" (10/23/2016)  . Pharyngitis 10/23/2016   notes 10/23/2016  . Pulmonary embolism (HCC) 01/29/2016  . S/P bidirectional Glenn shunt    hx/notes 10/23/2016  . S/P Fontan procedure    hx/notes 10/23/2016  . Tricuspid atresia and stenosis, congenital     Past Surgical History:  Procedure Laterality Date  . CARDIAC [facility]  [facility]; hx/notes  10/23/2016  . CARDIAC SURGERY  01/1997; 2000   [facility]     Family History  Problem Relation Age of Onset  . Hypertension Father       Social History   Socioeconomic History  . Marital status: Significant Other    Spouse name: Not on file  . Number of children: Not on file  . Years of education: Not on file  . Highest education level: Not on file  Occupational History  . Not on file  Social Needs  . Financial resource strain: Not on file  . Food insecurity:    Worry: Not on file    Inability: Not on file  . Transportation needs:    Medical: Not on file    Non-medical: Not on file  Tobacco Use  . Smoking status: Never Smoker  . Smokeless tobacco: Never Used  . Tobacco comment: declined condoms  Substance and Sexual Activity  . Alcohol use: No  . Drug use: No  . Sexual activity: Yes  Lifestyle  . Physical activity:    Days per week: Not on file    Minutes per session: Not on file  . Stress: Not on file  Relationships  . Social connections:    Talks on phone: Not on file    Gets together: Not on  file    Attends religious service: Not on file    Active member of club or organization: Not on file    Attends meetings of clubs or organizations: Not on file    Relationship status: Not on file  Other Topics Concern  . Not on file  Social History Narrative  . Not on file    No Known Allergies   Current Outpatient Medications:  .  Darunavir-Cobicisctat-Emtricitabine-Tenofovir Alafenamide (SYMTUZA) 800-150-200-10 MG TABS, Take 1 tablet by mouth daily with breakfast., Disp: 30 tablet, Rfl: 5 .  doxycycline (VIBRAMYCIN) 100 MG capsule, Take 1 capsule (100 mg total) by mouth 2 (two) times daily., Disp: 14 capsule, Rfl: 0   Review of Systems  Constitutional: Negative for chills, diaphoresis and fever.  HENT: Negative for congestion, hearing loss, sore throat and tinnitus.   Eyes: Negative for photophobia.  Respiratory: Negative for cough,  shortness of breath and wheezing.   Cardiovascular: Negative for chest pain, palpitations and leg swelling.  Gastrointestinal: Negative for abdominal pain, blood in stool, constipation, diarrhea, nausea and vomiting.  Genitourinary: Negative for dysuria, flank pain and hematuria.  Musculoskeletal: Negative for back pain and myalgias.  Skin: Negative for rash.  Neurological: Negative for dizziness, weakness and headaches.  Hematological: Does not bruise/bleed easily.  Psychiatric/Behavioral: Negative for suicidal ideas. The patient is not nervous/anxious.        Objective:   Physical Exam  Constitutional: He is oriented to person, place, and time. He appears well-developed and well-nourished. No distress.  HENT:  Head: Normocephalic and atraumatic.  Mouth/Throat: No oropharyngeal exudate.  Eyes: Conjunctivae and EOM are normal. No scleral icterus.  Neck: Normal range of motion. Neck supple.  Cardiovascular: Normal rate and regular rhythm.  Pulmonary/Chest: Effort normal. No respiratory distress. He has no wheezes.  Abdominal: He exhibits no distension.  Musculoskeletal: He exhibits no edema or tenderness.  Neurological: He is alert and oriented to person, place, and time. He exhibits normal muscle tone. Coordination normal.  Skin: Skin is warm and dry. No rash noted. He is not diaphoretic. No erythema. No pallor.  Psychiatric: He has a normal mood and affect. Judgment and thought content normal. He is slowed.          Assessment & Plan:   Acute HIV disease: Continue SYMTUZA check labs today and check for STIs, got information in regards to preexposure prophylaxis in our clinic.  He says he will tell his boyfriend about it for sure.  Congenital heart disease: continue to follow at [facility]  Cognitive delay: I am not quite sure the extent of this but at present he is seemingly reliable with Mother's coaching as far as his adherence  I spent greater than 25 minutes with the patient  including greater than 50% of time in face to face counsel of the patient mother with regards the importance of being adherent to intervertebral medications the dangers of starting and stopping medications and the need to be always taking medications to preserve immune health and health overall and prevent transmission to others, also with regards to the nature of his particular regimen which is a high barrier to resistance, also talking about lifetime risk of HIV and high risk community such as the one in which he is living in and giving him information about preexposure prophylaxis to give to his friends.  And in coordination of his care.

## 2018-01-26 LAB — CBC WITH DIFFERENTIAL/PLATELET
BASOS PCT: 1 %
Basophils Absolute: 29 cells/uL (ref 0–200)
EOS ABS: 177 {cells}/uL (ref 15–500)
EOS PCT: 6.1 %
HCT: 47.9 % (ref 38.5–50.0)
HEMOGLOBIN: 16.4 g/dL (ref 13.2–17.1)
Lymphs Abs: 1282 cells/uL (ref 850–3900)
MCH: 30.4 pg (ref 27.0–33.0)
MCHC: 34.2 g/dL (ref 32.0–36.0)
MCV: 88.9 fL (ref 80.0–100.0)
MONOS PCT: 12.2 %
MPV: 13.2 fL — AB (ref 7.5–12.5)
NEUTROS ABS: 1059 {cells}/uL — AB (ref 1500–7800)
Neutrophils Relative %: 36.5 %
Platelets: 131 10*3/uL — ABNORMAL LOW (ref 140–400)
RBC: 5.39 10*6/uL (ref 4.20–5.80)
RDW: 13 % (ref 11.0–15.0)
Total Lymphocyte: 44.2 %
WBC mixed population: 354 cells/uL (ref 200–950)
WBC: 2.9 10*3/uL — AB (ref 3.8–10.8)

## 2018-01-26 LAB — COMPLETE METABOLIC PANEL WITH GFR
AG Ratio: 1.5 (calc) (ref 1.0–2.5)
ALKALINE PHOSPHATASE (APISO): 74 U/L (ref 40–115)
ALT: 25 U/L (ref 9–46)
AST: 28 U/L (ref 10–40)
Albumin: 4.9 g/dL (ref 3.6–5.1)
BUN: 11 mg/dL (ref 7–25)
CALCIUM: 9.7 mg/dL (ref 8.6–10.3)
CO2: 23 mmol/L (ref 20–32)
CREATININE: 0.95 mg/dL (ref 0.60–1.35)
Chloride: 106 mmol/L (ref 98–110)
GFR, EST NON AFRICAN AMERICAN: 114 mL/min/{1.73_m2} (ref >=60)
GFR, Est African American: 132 mL/min/{1.73_m2} (ref >=60)
GLUCOSE: 104 mg/dL — AB (ref 65–99)
Globulin: 3.2 g/dL (calc) (ref 1.9–3.7)
Potassium: 4 mmol/L (ref 3.5–5.3)
Sodium: 139 mmol/L (ref 135–146)
Total Bilirubin: 0.7 mg/dL (ref 0.2–1.2)
Total Protein: 8.1 g/dL (ref 6.1–8.1)

## 2018-01-26 LAB — HIV-1 RNA QUANT-NO REFLEX-BLD
HIV 1 RNA QUANT: DETECTED {copies}/mL — AB
HIV-1 RNA Quant, Log: <1.3 Log copies/mL — AB

## 2018-01-26 LAB — RPR: RPR: NONREACTIVE

## 2018-01-26 LAB — CYTOLOGY, (ORAL, ANAL, URETHRAL) ANCILLARY ONLY
Chlamydia: NEGATIVE
Chlamydia: NEGATIVE
Neisseria Gonorrhea: NEGATIVE
Neisseria Gonorrhea: NEGATIVE

## 2018-01-30 ENCOUNTER — Telehealth: Payer: Self-pay | Admitting: *Deleted

## 2018-01-30 NOTE — Telephone Encounter (Signed)
Order received initially on 09/30/17 by Dr Ulyses JarredVan Dam/Tammy King RN to evaluate patient [facility]  In home visit made on 10/01/17 and evaluation completed with consent to care signed at this time.   Frequency / Duration of [facility] visits: Effective: 01/30/18: 11mo4, 4 PRN's for complications with disease process/progression, medication changes or concerns   [facility] will assess for learning needs related to diagnosis and treatment regimen, provide education as needed, fill pill box if needed, address any barriers which may be preventing or posing a risk to medication compliance, and communicating with care team including physician and case manager. Focus of each visit with be keeping the patient in care, medications access and assistance with removing potential or current barriers to care.  Individualized Plan Of Care 01/30/18 to 04/30/18  a. Type of service(s) and care to be delivered: RN Case Management  b. Frequency and duration of service: Effective 10/01/17: 411mo1, 502mo3, 4 prns for complications with disease process/progression, medication changes or concerns .   Visits/Contact may be conducted telephonically or in person to best suit the patient.  c. Activity restrictions: Pt may be up as tolerated and can safely ambulate without the need for a assistive device   d. Safety Measures: Standard Precautions/Infection Control  And Disease Management  e. Service Objectives and Goals: Service Objectives are to assist the pt with HIV medication regimen adherence and staying in care with the Infectious Disease Clinic by identifying barriers to care. RN  will address the barriers that are identified by the patient. Currently the patient states his goals would be to have a 1 pill regimen.  RN's focus will be on keeping the stated goals at the center of the plan of  care and explaining the patinet how to successful reach his goals with behavioral changes to support his  goals. Patient struggles with adherence when he feels unsupported.   f. Equipment required: No additional equipment needs at this time   g. Functional Limitations: Cognitive delay but functional  h. Rehabilitation potential: Guarded   i. Diet and Nutritional Needs: Regular Diet   j. Medications and treatments: Medications have been reconciled and reviewed and are a part of EPIC electronic file   k. Specific therapies if needed: RN   l. Pertinent diagnoses: New HIV disease   m. Expected outcome: Guarded

## 2018-02-01 NOTE — Telephone Encounter (Signed)
Thanks so much [name] I do approve of this plan.

## 2018-02-12 MED FILL — SYMTUZA 800-150-200-10 MG T: 800-150-200 | 30 days supply | Qty: 30 | Fill #2

## 2018-03-11 MED FILL — SYMTUZA 800-150-200-10 MG T: 800-150-200 | 30 days supply | Qty: 30 | Fill #3

## 2018-03-24 ENCOUNTER — Ambulatory Visit: Payer: Self-pay | Admitting: *Deleted

## 2018-03-24 DIAGNOSIS — B20 Human immunodeficiency virus [HIV] disease: Secondary | ICD-10-CM

## 2018-04-02 NOTE — Progress Notes (Signed)
Received a call from [name] and he asked me to come by his home to just talk.  Home visit made today and he did not appear to really speak about his medications but stated a lot has been going on in his personal life. He has recently lost his job, ended a relationship and feels overwhelmed. I strongly suggested that he allow me to make an appt with [facility] counselor. Micheal Leonard would not commit but agreed to additional lubricants.   Traveled to the clinic and got a supply of lubricants and on the way back received a message from Micheal Leonard asking me to come back to his home another day.

## 2018-04-03 ENCOUNTER — Telehealth: Payer: Self-pay | Admitting: *Deleted

## 2018-04-03 MED FILL — SYMTUZA 800-150-200-10 MG T: 800-150-200 | 30 days supply | Qty: 30 | Fill #4

## 2018-04-03 NOTE — Telephone Encounter (Signed)
Received a call form [name] and he stated he is in a new relationship and his partner needs to be in care. [name] stated his [place] from [place] and is currently on [place] without a local provider. I asked [name] to please have his partner give me a call to discuss care and his availabilty days. I informed [name] that on the 1st visit we like to have new patients see the provider, SW if needed, counselor, lab and financial. I will call the office to arrange a linked visit once I speak with the patient and get consent from him.

## 2018-04-29 ENCOUNTER — Other Ambulatory Visit: Payer: 59

## 2018-04-30 ENCOUNTER — Other Ambulatory Visit: Payer: 59

## 2018-04-30 ENCOUNTER — Ambulatory Visit (INDEPENDENT_AMBULATORY_CARE_PROVIDER_SITE_OTHER): Payer: 59 | Admitting: Licensed Clinical Social Worker

## 2018-04-30 ENCOUNTER — Other Ambulatory Visit (HOSPITAL_COMMUNITY)
Admission: RE | Admit: 2018-04-30 | Discharge: 2018-04-30 | Disposition: A | Discharge status: Home / Self Care | Payer: 59 | Source: Ambulatory Visit | Attending: Infectious Disease | Admitting: Infectious Disease

## 2018-04-30 DIAGNOSIS — Z9889 Other specified postprocedural states: Secondary | ICD-10-CM | POA: Insufficient documentation

## 2018-04-30 DIAGNOSIS — Z79899 Other long term (current) drug therapy: Secondary | ICD-10-CM | POA: Diagnosis not present

## 2018-04-30 DIAGNOSIS — Z113 Encounter for screening for infections with a predominantly sexual mode of transmission: Secondary | ICD-10-CM | POA: Insufficient documentation

## 2018-04-30 DIAGNOSIS — F4323 Adjustment disorder with mixed anxiety and depressed mood: Secondary | ICD-10-CM | POA: Diagnosis not present

## 2018-04-30 DIAGNOSIS — B20 Human immunodeficiency virus [HIV] disease: Secondary | ICD-10-CM

## 2018-04-30 MED FILL — SYMTUZA 800-150-200-10 MG T: 800-150-200 | 30 days supply | Qty: 30 | Fill #5

## 2018-04-30 NOTE — BH Specialist Note (Deleted)
Integrated Behavioral Health Comprehensive Clinical Assessment  MRN: [id number] Name: Micheal Leonard  Session Time: {Time; Appointment:21385} - {Time; Appointment:21385} Total time: {IBH Total Time:21014050}  Type of Service: Integrated Behavioral Health-Individual Interpretor: {yes WU:981191} Interpretor Name and Language: ***  PRESENTING CONCERNS: Micheal Leonard is a 22 y.o. male accompanied by {Patient accompanied [phone]}. Devanta Daniel was referred to [facility] clinician for ***.  Previous mental health services Have you ever been treated for a mental health problem? {BHH YES OR NO:22294} If "Yes", when were you treated and whom did you see? *** Have you ever been hospitalized for mental health treatment? {BHH YES OR NO:22294} Have you ever been treated for any of the following? Past Psychiatric History/Hospitalization(s): Anxiety: {BHH YES OR NO:22294} Bipolar Disorder: {BHH YES OR NO:22294} Depression: {BHH YES OR NO:22294} Mania: {BHH YES OR NO:22294} Psychosis: {BHH YES OR NO:22294} Schizophrenia: {BHH YES OR NO:22294} Personality Disorder: {BHH YES OR NO:22294} Hospitalization for psychiatric illness: {BHH YES OR NO:22294} History of Electroconvulsive Shock Therapy: {BHH YES OR NO:22294} Prior Suicide Attempts: {BHH YES OR NO:22294} Have you ever had thoughts of harming yourself or others or attempted suicide? {CHL AMB BH Suicide Current Mental Status:21022748}  Medical history  has a past medical history of Cardiac anomaly, congenital, Diarrhea (01/22/2017), EBV infection, Hemoptysis (10/23/2016), Hypertension, Migraine, Pharyngitis (10/23/2016), Pulmonary embolism (HCC) (01/29/2016), S/P bidirectional Glenn shunt, S/P Fontan procedure, and Tricuspid atresia and stenosis, congenital. Primary Care Physician: Farris Has, MD Date of last physical exam: *** Allergies: No Known Allergies Current medications:  Outpatient Encounter Medications  as of 04/30/2018  Medication Sig  . Darunavir-Cobicisctat-Emtricitabine-Tenofovir Alafenamide (SYMTUZA) 800-150-200-10 MG TABS Take 1 tablet by mouth daily with breakfast.  . doxycycline (VIBRAMYCIN) 100 MG capsule Take 1 capsule (100 mg total) by mouth 2 (two) times daily.   No facility-administered encounter medications on file as of 04/30/2018.    Have you ever had any serious medication reactions? {No or If yes, please specify:20789} Is there any history of mental health problems or substance abuse in your family? {No or If yes, please specify:20789} Has anyone in your family been hospitalized for mental health treatment? {No or If yes, please specify:20789}  Social/family history Who lives in your current household? *** What is your family of origin, childhood history? *** Where were you born? *** Where did you grow up? *** How many different homes have you lived in? *** Describe your childhood: *** Do you have siblings, step/half siblings? {No or If yes, please specify:20789} What are their names, relation, sex, age? *** Are your parents separated or divorced? {No or If yes, please specify:20789} What are your social supports? ***  Education How many grades have you completed? {Misc; education levels:33222} Did you have any problems in school? {No or If yes, please specify:20789}  Employment/financial issues ***  Sleep Usual bedtime is *** {Time; am/pm:31393} Sleeping arrangements: *** Problems with snoring: {CHL AMB YES/NO/NOT KNOWN:210130105} Obstructive sleep apnea {CHL AMB IS/IS NOT:210130109} a concern. Problems with nightmares: {No or If yes, please specify:20789} Problems with night terrors: {No or If yes, please specify:20789} Problems with sleepwalking: {CHL AMB YES/NO/NOT KNOWN:210130105}  Trauma/Abuse history Have you ever experienced or been exposed to any form of abuse? {No or If yes, please specify:20789} Have you ever experienced or been exposed to  something traumatic? {No or If yes, please specify:20789}  Substance use Do you use alcohol, nicotine or caffeine? {findings; intake alcohol/tobacco/caffeine:30392} How old were you when you first tasted alcohol? *** Have you ever used  illicit drugs or abused prescription medications? {Drugs involved:60372}  Mental status General appearance/Behavior: {BHH GENERAL APPEARANCE/BEHAVIOR:22300} Eye contact: {BHH EYE CONTACT:22301} Motor behavior: {BHH MOTOR BEHAVIOR:22302} Speech: {BHH SPEECH:22304} Level of consciousness: {BHH LEVEL OF CONSCIOUSNESS:22305} Mood: {BHH MOOD:22306} Affect: {BHH AFFECT:22307} Anxiety level: {BHH ANXIETY LEVEL:22308} Thought process: {BHH THOUGHT PROCESS:22309} Thought content: {BHH THOUGHT CONTENT:22310} Perception: {BHH PERCEPTION:22311} Judgment: {BHH JUDGMENT:22312} Insight: {BHH INSIGHT:22313}  Diagnosis No diagnosis found.  GOALS ADDRESSED: Patient will reduce symptoms of: {IBH Symptoms:21014056} and increase knowledge and/or ability of: {IBH Patient Tools:21014057} and also: {IBH Goals:21014053}              INTERVENTIONS: Interventions utilized: {IBH Interventions:21014054} Standardized Assessments completed: {IBH Screening Tools:21014051}   ASSESSMENT/OUTCOME: ***  PLAN: ***  Scheduled next visit: ***  Reita Cliche Counselor

## 2018-05-01 ENCOUNTER — Telehealth: Payer: Self-pay | Admitting: *Deleted

## 2018-05-01 LAB — URINE CYTOLOGY ANCILLARY ONLY
CHLAMYDIA, DNA PROBE: NEGATIVE
NEISSERIA GONORRHEA: NEGATIVE

## 2018-05-01 LAB — T-HELPER CELL (CD4) - (RCID CLINIC ONLY)
CD4 T CELL ABS: 270 /uL — AB (ref 400–2700)
CD4 T CELL HELPER: 19 % — AB (ref 33–55)
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
## 2018-05-05 ENCOUNTER — Ambulatory Visit (INDEPENDENT_AMBULATORY_CARE_PROVIDER_SITE_OTHER): Payer: 59 | Admitting: Licensed Clinical Social Worker

## 2018-05-05 DIAGNOSIS — F4323 Adjustment disorder with mixed anxiety and depressed mood: Secondary | ICD-10-CM | POA: Diagnosis not present

## 2018-05-05 LAB — CBC WITH DIFFERENTIAL/PLATELET
Basophils Absolute: 39 cells/uL (ref 0–200)
Basophils Relative: 1.1 %
EOS ABS: 238 {cells}/uL (ref 15–500)
Eosinophils Relative: 6.8 %
HEMATOCRIT: 47.1 % (ref 38.5–50.0)
Hemoglobin: 16.2 g/dL (ref 13.2–17.1)
LYMPHS ABS: 1477 {cells}/uL (ref 850–3900)
MCH: 30.5 pg (ref 27.0–33.0)
MCHC: 34.4 g/dL (ref 32.0–36.0)
MCV: 88.7 fL (ref 80.0–100.0)
MPV: 12.3 fL (ref 7.5–12.5)
Monocytes Relative: 10.5 %
Neutro Abs: 1379 cells/uL — ABNORMAL LOW (ref 1500–7800)
Neutrophils Relative %: 39.4 %
Platelets: 221 10*3/uL (ref 140–400)
RBC: 5.31 10*6/uL (ref 4.20–5.80)
RDW: 12.9 % (ref 11.0–15.0)
Total Lymphocyte: 42.2 %
WBC: 3.5 10*3/uL — AB (ref 3.8–10.8)
WBCMIX: 368 {cells}/uL (ref 200–950)

## 2018-05-05 LAB — HIV-1 RNA QUANT-NO REFLEX-BLD
HIV 1 RNA Quant: <20 copies/mL — AB
HIV-1 RNA QUANT, LOG: DETECTED {Log_copies}/mL — AB

## 2018-05-05 LAB — COMPLETE METABOLIC PANEL WITH GFR
AG RATIO: 1.3 (calc) (ref 1.0–2.5)
ALT: 19 U/L (ref 9–46)
AST: 22 U/L (ref 10–40)
Albumin: 4.7 g/dL (ref 3.6–5.1)
Alkaline phosphatase (APISO): 75 U/L (ref 40–115)
BUN: 11 mg/dL (ref 7–25)
CALCIUM: 9.4 mg/dL (ref 8.6–10.3)
CO2: 26 mmol/L (ref 20–32)
Chloride: 102 mmol/L (ref 98–110)
Creat: 0.97 mg/dL (ref 0.60–1.35)
GFR, EST AFRICAN AMERICAN: 129 mL/min/{1.73_m2} (ref >=60)
GFR, EST NON AFRICAN AMERICAN: 111 mL/min/{1.73_m2} (ref >=60)
GLOBULIN: 3.5 g/dL (ref 1.9–3.7)
Glucose, Bld: 98 mg/dL (ref 65–99)
POTASSIUM: 4.2 mmol/L (ref 3.5–5.3)
SODIUM: 137 mmol/L (ref 135–146)
TOTAL PROTEIN: 8.2 g/dL — AB (ref 6.1–8.1)
Total Bilirubin: 0.5 mg/dL (ref 0.2–1.2)

## 2018-05-05 LAB — LIPID PANEL
Cholesterol: 195 mg/dL (ref <200)
HDL: 46 mg/dL (ref >40)
LDL Cholesterol (Calc): 133 mg/dL (calc) — ABNORMAL HIGH
NON-HDL CHOLESTEROL (CALC): 149 mg/dL — AB (ref <130)
Total CHOL/HDL Ratio: 4.2 (calc) (ref <5.0)
Triglycerides: 69 mg/dL (ref <150)

## 2018-05-05 LAB — RPR: RPR: NONREACTIVE
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
## 2018-05-07 NOTE — Telephone Encounter (Signed)
Order received initially on 09/30/17 by Dr Ulyses Jarred RN to evaluate patient for [facility]).  In home visit made on 10/01/17 and evaluation completed with consent to care signed at this time.   Frequency / Duration of [facility] visits: Effective: 05/01/18: 34mo4, 4 PRN's for complications with disease process/progression, medication changes or concerns   [facility] will assess for learning needs related to diagnosis and treatment regimen, provide education as needed, fill pill box if needed, address any barriers which may be preventing or posing a risk to medication compliance, and communicating with care team including physician and case manager. Focus of each visit with be keeping the patient in care, medications access and assistance with removing potential or current barriers to care.  Individualized Plan Of Care 05/01/18 to 07/30/18  a. Type of service(s) and care to be delivered: RN Case Management  b. Frequency and duration of service: Effective 05/01/18: 60mo4, 4 prns for complications with disease process/progression, medication changes or concerns .   Visits/Contact may be conducted telephonically or in person to best suit the patient.  c. Activity restrictions: Pt may be up as tolerated and can safely ambulate without the need for a assistive device   d. Safety Measures: Standard Precautions/Infection Control  And Disease Management  e. Service Objectives and Goals: Service Objectives are to assist the pt with HIV medication regimen adherence and staying in care with the Infectious Disease Clinic by identifying barriers to care. RN  will address the barriers that are identified by the patient. Currently the patient states his goals would be to have a 1 pill regimen.  RN's focus will be on keeping the stated goals at the center of the plan of  care and explaining the patinet how to successful reach his goals with behavioral changes to support his goals.  Patient struggles with adherence when he feels unsupported.   f. Equipment required: No additional equipment needs at this time   g. Functional Limitations: Cognitive delay but functional  h. Rehabilitation potential: Guarded   i. Diet and Nutritional Needs: Regular Diet   j. Medications and treatments: Medications have been reconciled and reviewed and are a part of EPIC electronic file   k. Specific therapies if needed: RN   l. Pertinent diagnoses: New HIV disease   m. Expected outcome: Guarded

## 2018-05-08 NOTE — Telephone Encounter (Signed)
I approve of POC!  Have a great weekend yourself thanks [name]!

## 2018-05-14 ENCOUNTER — Encounter: Payer: 59 | Admitting: Infectious Disease

## 2018-05-18 ENCOUNTER — Ambulatory Visit (INDEPENDENT_AMBULATORY_CARE_PROVIDER_SITE_OTHER): Payer: 59 | Admitting: Licensed Clinical Social Worker

## 2018-05-18 DIAGNOSIS — F4323 Adjustment disorder with mixed anxiety and depressed mood: Secondary | ICD-10-CM

## 2018-05-19 NOTE — BH Specialist Note (Signed)
Integrated Behavioral Health Follow Up Visit  MRN: [id number] Name: Lamarco Gudiel  Number of Integrated Behavioral Health Clinician visits: 4/6 Session Start time: 2:33pm  Session End time: 3:07pm Total time: 30 minutes  Type of Service: Integrated Behavioral Health- Individual/Family Interpretor:No. Interpretor Name and Language: n/a  SUBJECTIVE: Ameen Mostafa is a 22 y.o. male accompanied by self Patient reports the following symptoms/concerns: difficulty making decisions, anxiety, trouble concentrating   OBJECTIVE: Mood: Anxious and Affect: Constricted Risk of harm to self or others: No plan to harm self or others  LIFE CONTEXT: Patient reports he is trying to surround himself with more positive people - he is no longer friends with or in communication with the two men whose friendship he was worried about losing in previous sessions. He states that he is not really upset or hurt, because he sees that the situation was bad for him. Patient is glad to be living with family and that mom is a great support for him. He reports wanting more friends who make good decisions, and people who care about what is best for him.   GOALS ADDRESSED: Patient will: 1.  Reduce symptoms of: anxiety    INTERVENTIONS: Interventions utilized:  Solution-Focused Strategies, Behavioral Activation and Supportive Counseling  ASSESSMENT: Patient currently experiencing anxiety and struggles with concentration and decision making. He states that he is trying to make better decisions, but after recent relationship and friendship ended he is unsure of who to trust. Counselor explored with patient ways that he can identify someone who will be a positive influence on his life, emphasizing those who want him to be happy and healthy rather than wanting him to simply do what is best for them or what they want. Patient was able to identify ways that past friends have put aside what is best for him and focused only  on what they wanted. Counselor and patient explored decision-making strategies, including taking another's perspective and "playing the tape through". Patient reports he is able to do this in a neutral situation, but that in the moment he is required to make a decision he struggles to think of it in an objective way. Counselor and patient explored ways that patient can reality-check his options in a given situation. Patient was able to identify some of his values. Counselor guided him to identify actions that are and are not in alignment with these values. Patient identified that he wants to be helpful to others, but also does not want to be "walked on" by people who know he is helpful. Counselor and patient explored ways that patient can be more assertive, and how this can be a positive influence on others. Counselor pointed out that patient cannot make decisions for other people, only for himself. Counselor and patient explored general decisions that will be positive for him, and counselor pointed out that remembering his values will help him to determine positive vs negative decisions.   Patient may benefit from ongoing CBT and Solution Focused Therapy twice a month  PLAN: 1. Follow up with behavioral health clinician on : 06/10/18 @ 10am  Angus Palms, LCSW

## 2018-06-03 ENCOUNTER — Other Ambulatory Visit: Payer: Self-pay | Admitting: Infectious Disease

## 2018-06-08 MED FILL — SYMTUZA 800-150-200-10 MG T: 800-150-200 | 30 days supply | Qty: 30 | Fill #0

## 2018-06-09 ENCOUNTER — Ambulatory Visit (INDEPENDENT_AMBULATORY_CARE_PROVIDER_SITE_OTHER): Payer: 59 | Admitting: Licensed Clinical Social Worker

## 2018-06-09 DIAGNOSIS — F4323 Adjustment disorder with mixed anxiety and depressed mood: Secondary | ICD-10-CM

## 2018-06-09 IMAGING — CR DG CHEST 2V
2 series · 2 of 2 positions shown · non-contrast
Comparison: Chest radiograph 01/28/2016

CLINICAL DATA: Syncope.

EXAM:
CHEST  2 VIEW

[chest pa]
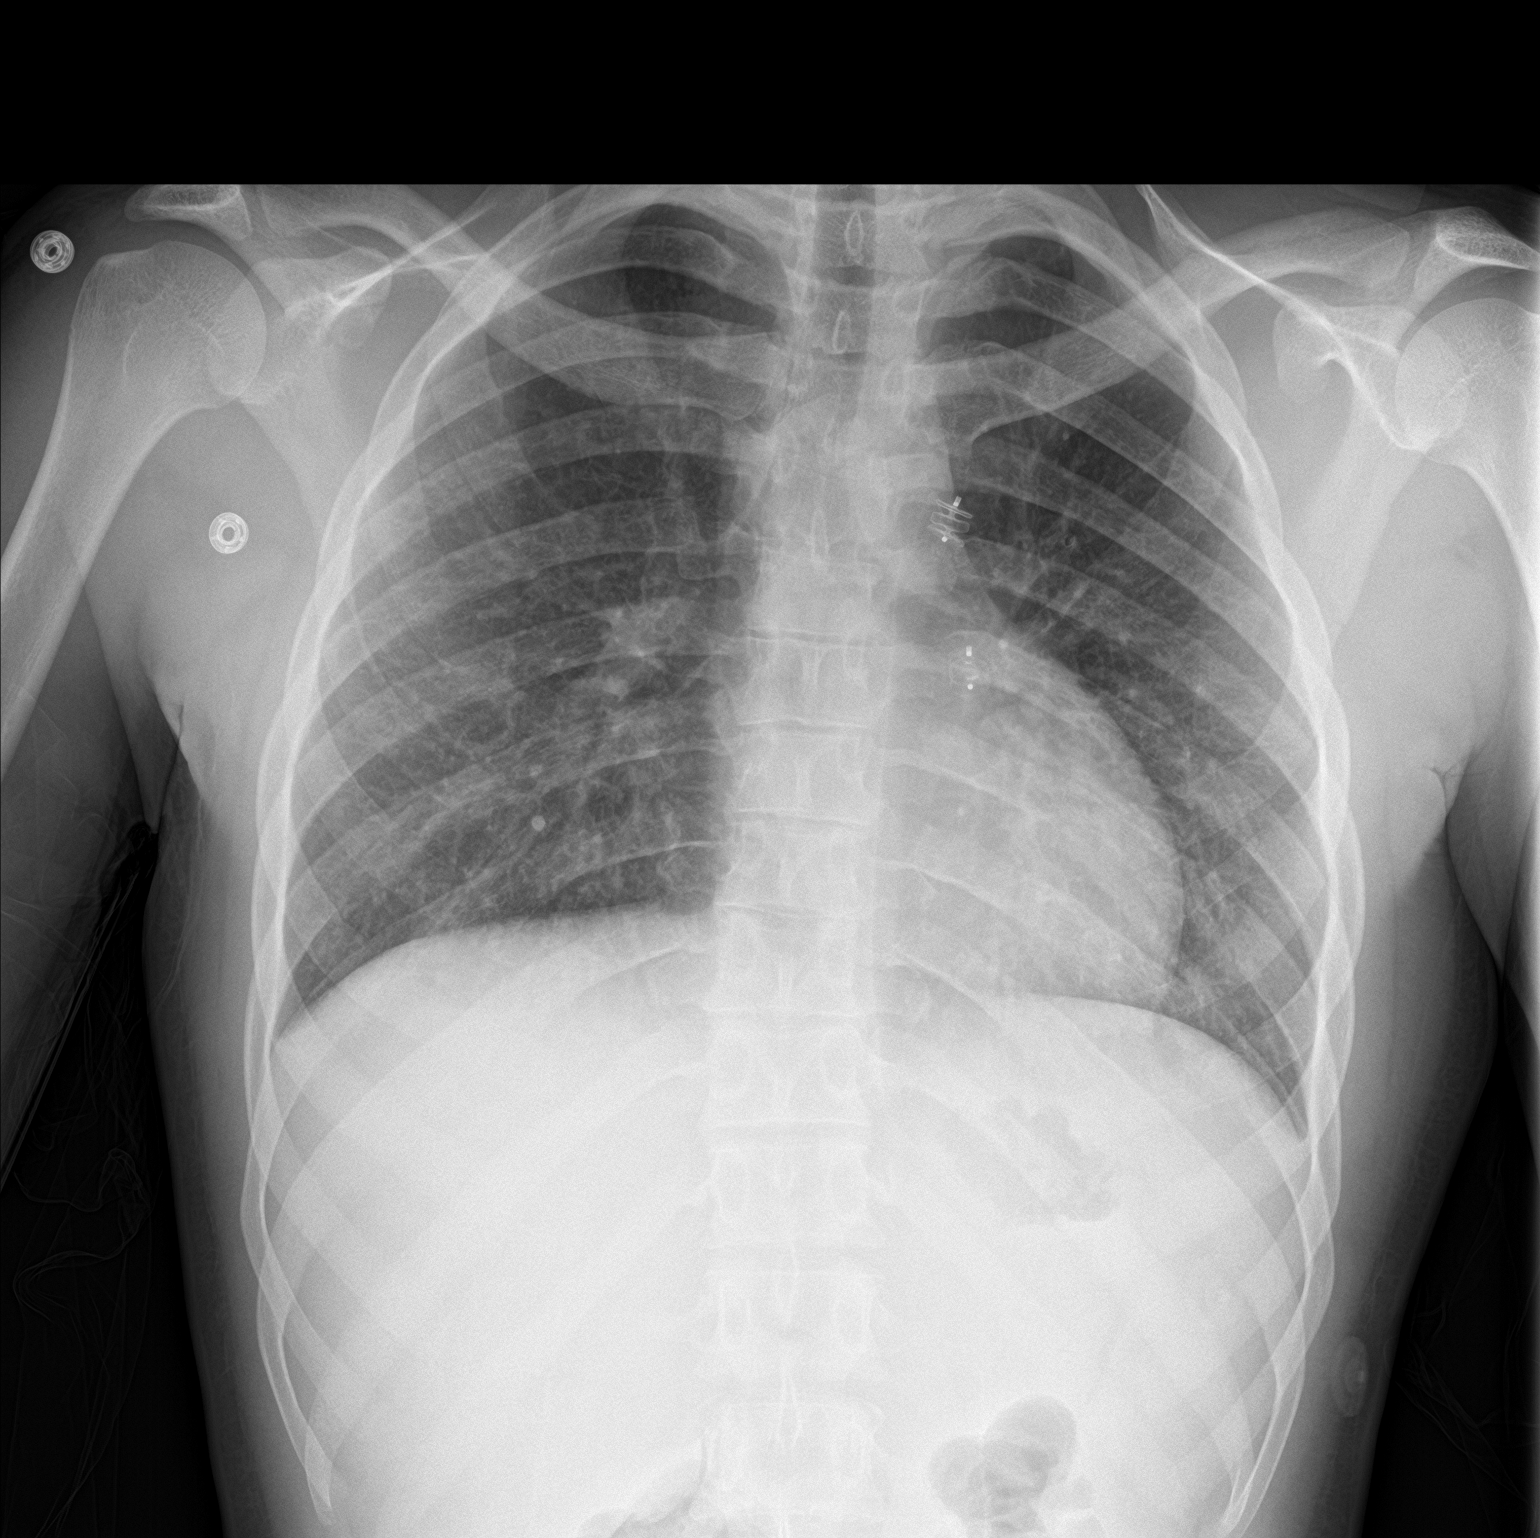

[chest lat]
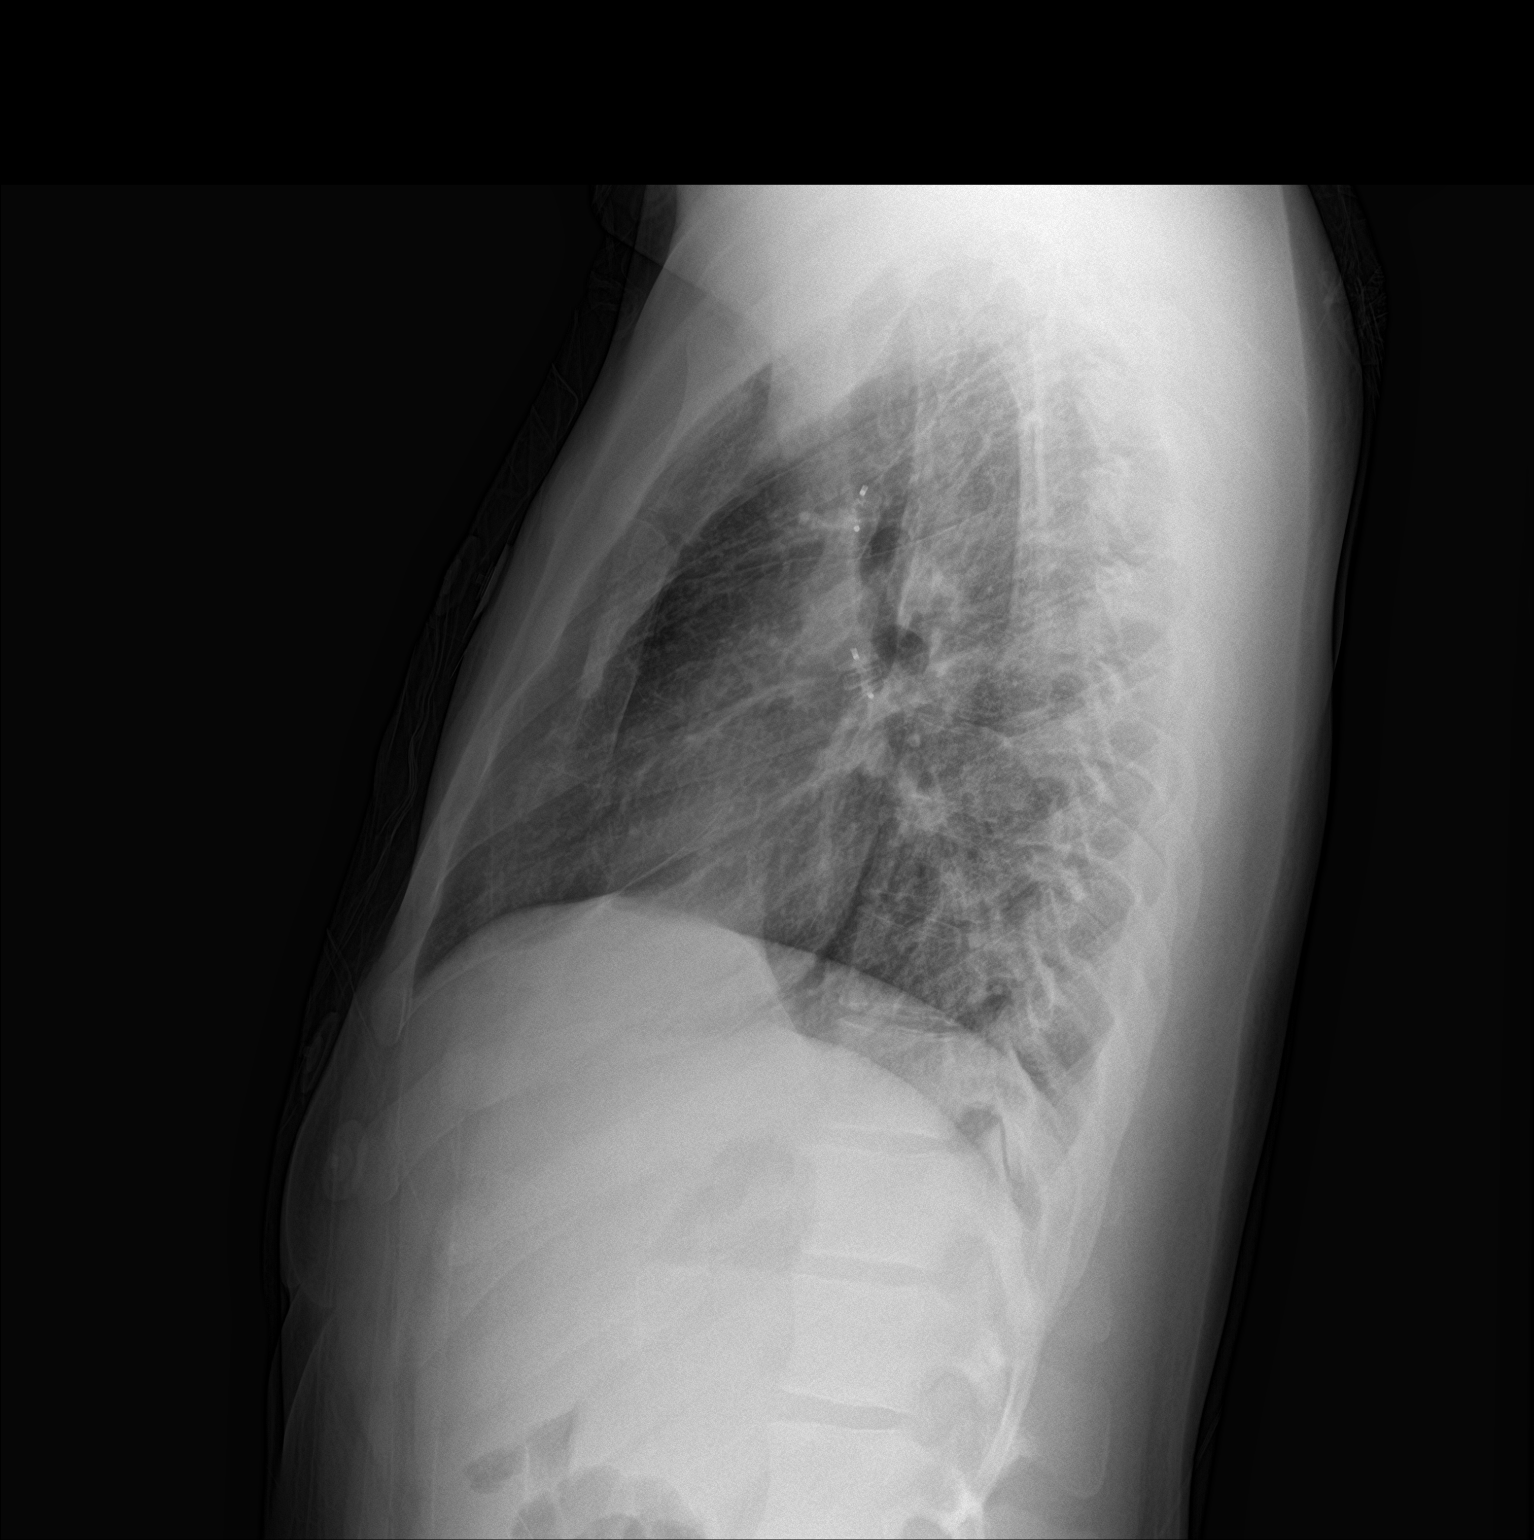

[2 of 2 positions shown; findings below may reference images not displayed]

FINDINGS: Heart is normal in size. Mediastinal contours are unchanged.
Presumed occlusion devices project over the left mediastinum. No
pulmonary edema. No focal airspace disease, pleural effusion or
pneumothorax. Low lung volumes with mild bronchovascular crowding.
No acute osseous abnormality.
IMPRESSION: No acute abnormality. Low lung volumes with bronchovascular
crowding.

## 2018-06-09 NOTE — BH Specialist Note (Signed)
Integrated Behavioral Health Follow Up Visit  MRN: [id number] Name: Micheal Leonard  Number of Integrated Behavioral Health Clinician visits: 5/6 Session Start time: 11:30am  Session End time: 12:02pm Total time: 30 minutes  Type of Service: Integrated Behavioral Health- Individual/Family Interpretor:No. Interpretor Name and Language: n/a  SUBJECTIVE: Micheal Leonard is a 22 y.o. male accompanied by self Patient reports the following symptoms/concerns: anxiety, irritability, trouble concentrating, isolating  OBJECTIVE: Mood: Depressed and Affect: Constricted Risk of harm to self or others: No plan to harm self or others  LIFE CONTEXT: Patient reports that he has been making better choices by spending less time with people he knows get into trouble and could get him into trouble (such as former friend and former significant other, neither of whom he speaks to any longer), but he has been spending more and more time alone in his room. He states that he plays video games all the time, and that his friends are other online players. Patient is beginning a new job over the weekend, at [employer]. He states that it will be nearly full-time to begin with as he is trained and during the holiday season but may not stay at that level.   GOALS ADDRESSED: Patient will: 1.  Reduce symptoms of: anxiety and depression   INTERVENTIONS: Interventions utilized:  Solution-Focused Strategies and Supportive Counseling  ASSESSMENT: Patient currently experiencing stress/anxiety, difficulty with concentrating, irritability, and a tendency to isolate himself.  Counselor and patient explored patient's holiday plans and his thoughts/feelings about them. Patient states that he will be going to [place] to spend Thanksgiving with his father, while his sister (who lives in [place]) will be spending it with their mother. Counselor guided patient to discuss his relationship with his father, as he does not talk  about him much. Patient indicates that he and his father get along, but father makes it clear that he expects his son to be/act differently than patient is. Counselor encouraged patient to identify what he thinks his father wants from him. Patient states that his father wants a son who plays sports and is very active all the time, which patient does/is not. He also reports that father often tells him to be a good example for his sister, who is a year younger. Counselor explored with patient what "a good example" might mean. Patient states that father wants him to have his own place, a car, and a full-time job. Patient states that he does independent things like working and moving out of dad's house (though he moved in with mom) and that sister does not need a good example because she is already living and working on her own. Counselor guided patient to identify how he feels about himself in the context of dad's desires for/expectations of him. Patient states that he does not feel bad, because he cannot be anyone other than who he is and he is proud of what he does.   Patient may benefit from ongoing monthly therapy sessions.  PLAN: Follow up with behavioral health clinician on : 07/16/18 when he sees Dr. Daiva EvesVan Dam  Angus Palmsegina Alexander, LCSW

## 2018-06-10 ENCOUNTER — Encounter: Payer: 59 | Admitting: Infectious Disease

## 2018-06-10 ENCOUNTER — Ambulatory Visit: Payer: 59 | Admitting: Licensed Clinical Social Worker

## 2018-06-24 IMAGING — CT CT NECK W/ CM
4 series · 15 of 33 positions shown, 18 images · IV contrast (APPLIED)
Comparison: CT chest October 23, 2016

CLINICAL DATA: Sore throat for 3 weeks. History of tricuspid
atresia and Abdoul-Aziz procedure.

EXAM:
CT NECK WITH CONTRAST
TECHNIQUE: Multidetector CT imaging of the neck was performed using the
standard protocol following the bolus administration of intravenous
contrast.
CONTRAST:  75mL 5SFVH7-KZZ IOPAMIDOL (5SFVH7-KZZ) INJECTION 61%

[Series 3: neck 2.0 i31s 3 · axial · 0.52mm/px · z∈[-207,-25]mm · 5 of 137 slices shown, 7 images]
[im 23/137  soft-tissue]
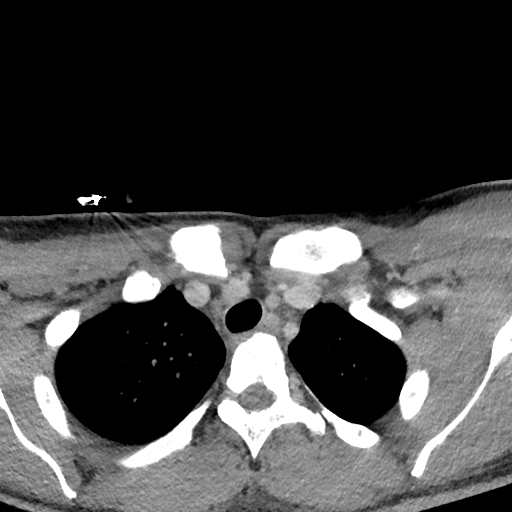
[im 23/137  bone]
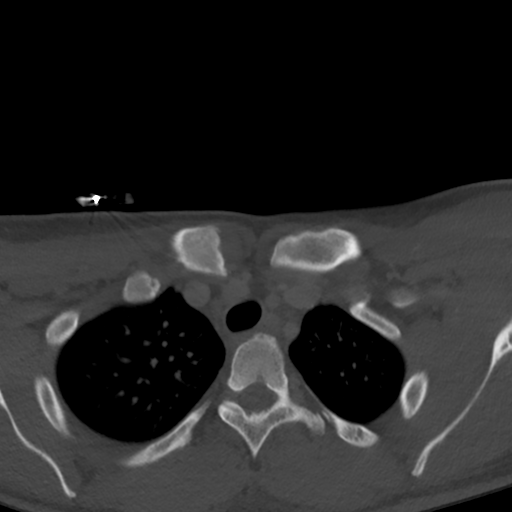
[im 46/137  bone]
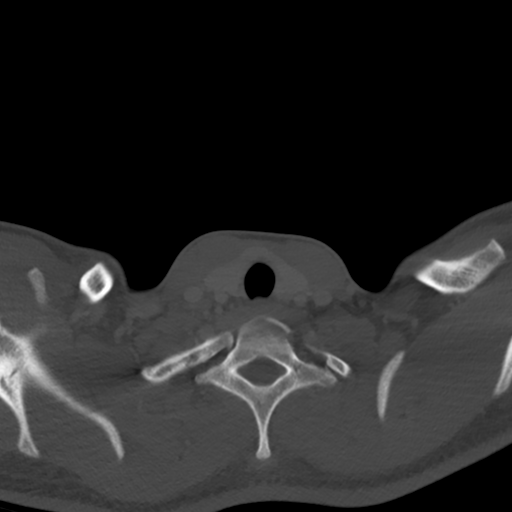
[im 69/137  bone]
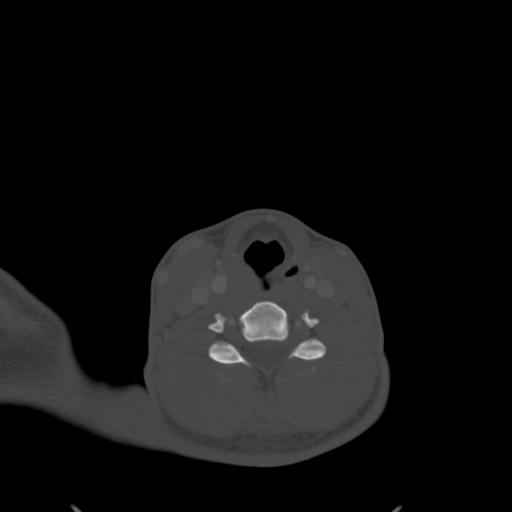
[im 91/137  bone]
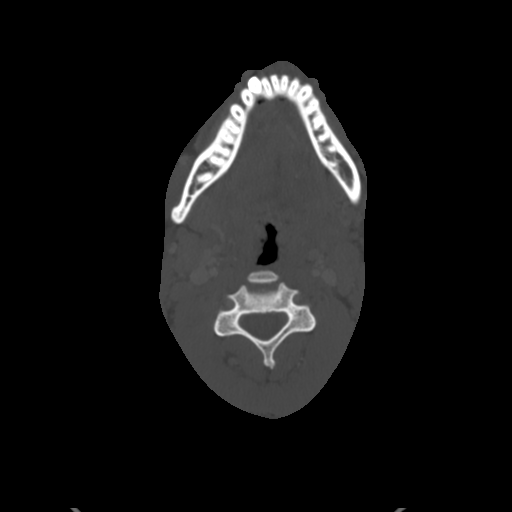
[im 114/137  soft-tissue]
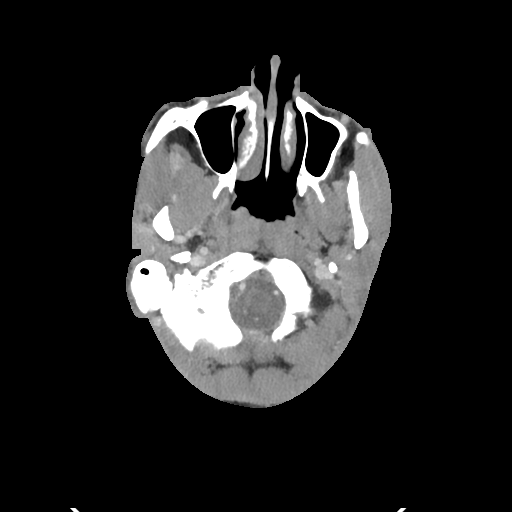
[im 114/137  bone]
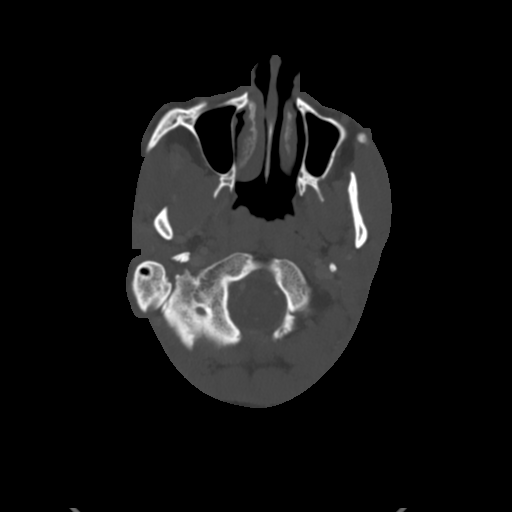

[Series 6: coronal st · coronal · 0.54mm/px · 3 of 139 slices shown]
[im 28/139  bone]
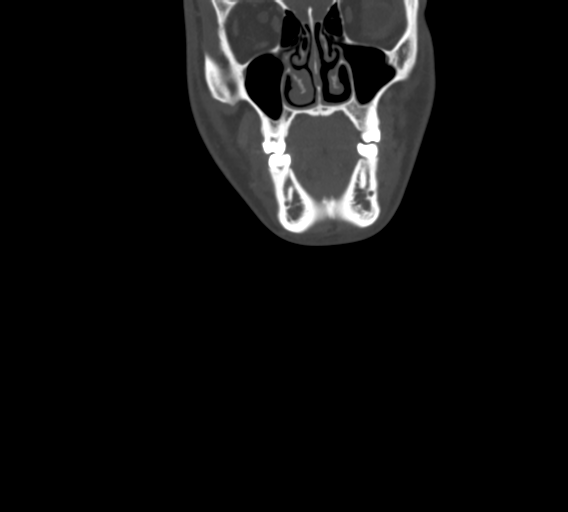
[im 56/139  bone]
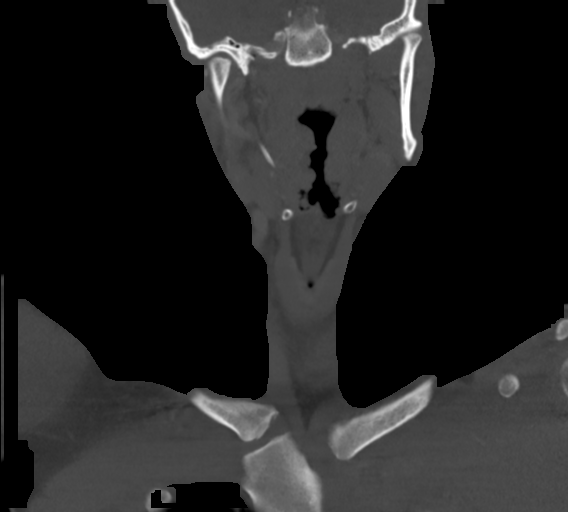
[im 83/139  bone]
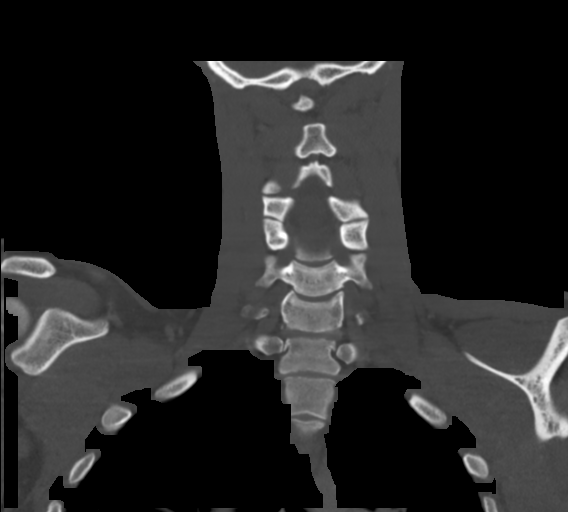

[Series 7: sagittal st · sagittal · 0.54mm/px · 5 of 101 slices shown, 6 images]
[im 34/101  bone]
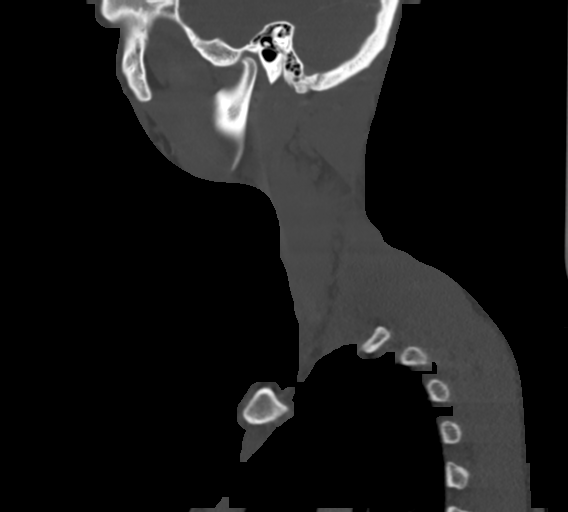
[im 42/101  bone]
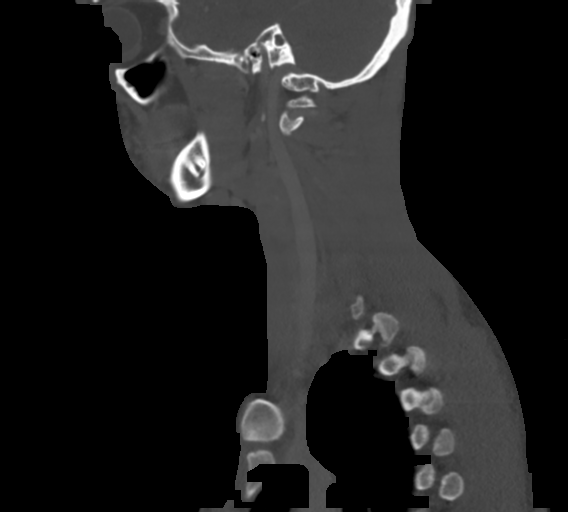
[im 51/101  soft-tissue]
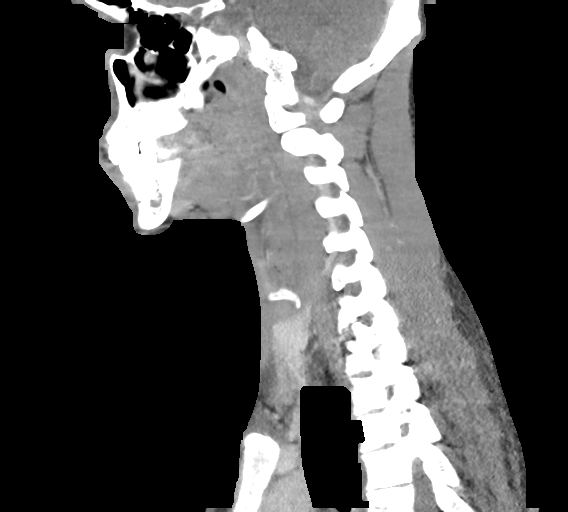
[im 51/101  bone]
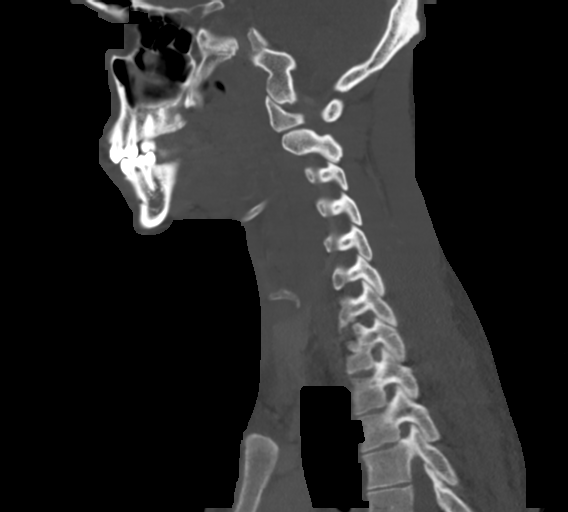
[im 59/101  bone]
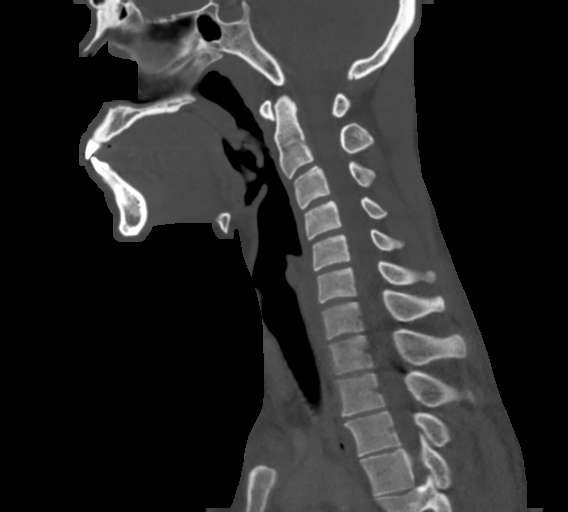
[im 67/101  bone]
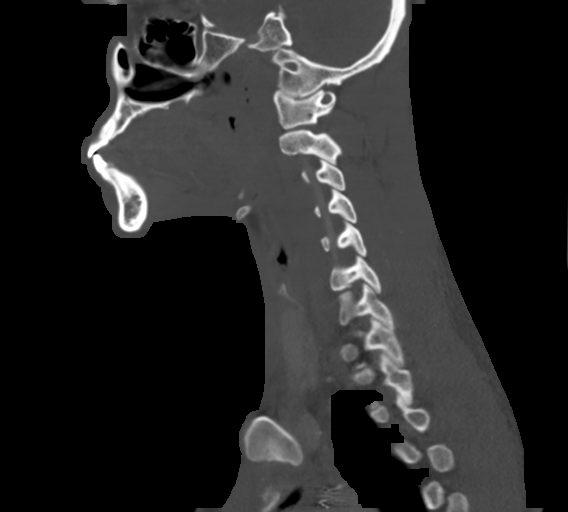

[Series 8: orthogonal st · axial · 0.39mm/px · z∈[-231,-186]mm · 2 of 136 slices shown]
[im 23/136  bone]
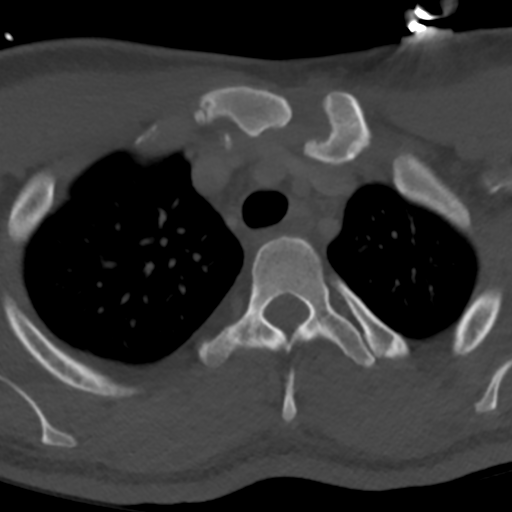
[im 46/136  bone]
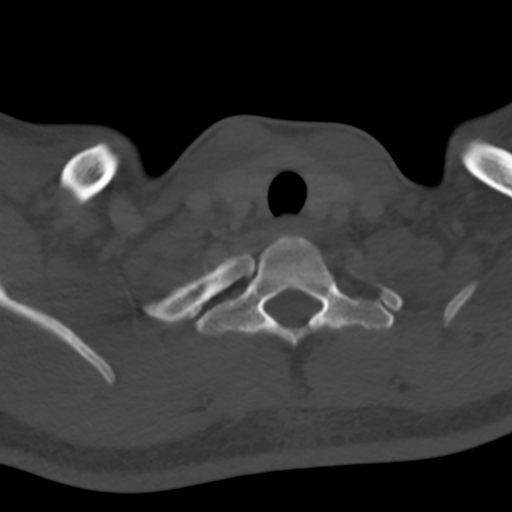

[15 of 33 positions shown; findings below may reference images not displayed]

FINDINGS: PHARYNX AND LARYNX: Mild asymmetrically enlarged RIGHT palatine
tonsil with mucosal edema. Relatively symmetric lymphoid enhancement
without focal fluid collection. Irregular appearance of the
pharyngeal mucosa. Preservation of parapharyngeal fat planes. Normal
larynx.

SALIVARY GLANDS: Normal.

THYROID: Normal.

LYMPH NODES: Bilateral level 2 lymphadenopathy to 19 mm with
homogeneous enhancement.

VASCULAR: Normal.

LIMITED INTRACRANIAL: Normal.

VISUALIZED ORBITS: Normal.

MASTOIDS AND VISUALIZED PARANASAL SINUSES: Frothy secretions RIGHT
sphenoid sinus. Mild maxillary mucosal thickening with subcentimeter
LEFT mucosal retention cyst. Mastoid air cells are well aerated.

SKELETON: Nonacute.

UPPER CHEST: Lung apices are clear. No superior mediastinal
lymphadenopathy.

OTHER: None.
IMPRESSION: Slight asymmetrically enlarged RIGHT palatine tonsil without CT
findings of acute tonsillitis or, peritonsillar abscess.

CT findings of pharyngeal mucosal ulceration, recommend direct
inspection.

Mild reactive cervical lymphadenopathy.

## 2018-07-01 ENCOUNTER — Other Ambulatory Visit: Payer: Self-pay | Admitting: Infectious Disease

## 2018-07-06 MED FILL — SYMTUZA 800-150-200-10 MG T: 800-150-200 | 30 days supply | Qty: 30 | Fill #0

## 2018-07-16 ENCOUNTER — Ambulatory Visit: Payer: 59 | Admitting: Infectious Disease

## 2018-07-16 ENCOUNTER — Ambulatory Visit (INDEPENDENT_AMBULATORY_CARE_PROVIDER_SITE_OTHER): Payer: 59 | Admitting: Licensed Clinical Social Worker

## 2018-07-16 ENCOUNTER — Ambulatory Visit: Payer: 59 | Admitting: Licensed Clinical Social Worker

## 2018-07-16 DIAGNOSIS — F411 Generalized anxiety disorder: Secondary | ICD-10-CM | POA: Diagnosis not present

## 2018-07-16 NOTE — BH Specialist Note (Signed)
Integrated Behavioral Health Comprehensive Clinical Assessment  MRN: [id number] Name: Micheal Leonard  Session Time:11:30am - 12:10am Total time: 40 minutes  Type of Service: Integrated Behavioral Health-Individual Interpretor: No. Interpretor Name and Language: n/a  PRESENTING CONCERNS: Micheal Hilasaiah Clack is a 23 y.o. male accompanied by self. Micheal Hilasaiah Printup [facility] for adjustment and anxiety.  Previous mental health services Have you ever been treated for a mental health problem? Yes If "Yes", when were you treated and whom did you see? Various outpatient therapists Have you ever been hospitalized for mental health treatment? No Have you ever been treated for any of the following? Past Psychiatric History/Hospitalization(s): none Anxiety: Yes Bipolar Disorder: No Depression: No Mania: Yes Psychosis: No Schizophrenia: No Personality Disorder: No Hospitalization for psychiatric illness: No History of Electroconvulsive Shock Therapy: No Have you ever had thoughts of harming yourself or others or attempted suicide? Suicidal ideation  Medical history  has a past medical history of Cardiac anomaly, congenital, Diarrhea (01/22/2017), EBV infection, Hemoptysis (10/23/2016), Hypertension, Migraine, Pharyngitis (10/23/2016), Pulmonary embolism (HCC) (01/29/2016), S/P bidirectional Glenn shunt, S/P Fontan procedure, and Tricuspid atresia and stenosis, congenital. Primary Care Physician: Farris HasMorrow, Aaron, MD  Allergies: No Known Allergies Current medications:  Outpatient Encounter Medications as of 07/16/2018  Medication Sig  . doxycycline (VIBRAMYCIN) 100 MG capsule Take 1 capsule (100 mg total) by mouth 2 (two) times daily.  . SYMTUZA 800-150-200-10 MG TABS TAKE 1 TABLET BY MOUTH DAILY WITH BREAKFAST.   No facility-administered encounter medications on file as of 07/16/2018.    Have you ever had any serious medication reactions? No Is there  any history of mental health problems or substance abuse in your family? Yes- unsure of diagnoses Has anyone in your family been hospitalized for mental health treatment? No  Social/family history Who lives in your current household? Patient and mom   What is your childhood history/family of origin? Patient grew up with mom and dad, now they are divorced and dad has remarried Describe your childhood: "It was okay, really normal." Patient has learning/cognitive delays. He reports that he has always been expected to be the example for his younger sister, even though they are just over a year apart in age. Do you have siblings, step/half siblings? Yes- one younger sister and one step sister on dad's side Are your parents separated or divorced? Yes- divorced and dad has remarried.Dad lives in [place], as does sister, mom lives in  [place] What are your social supports? Mom, friends  Education How many grades have you completed? High school graduate Did you have any problems in school? Yes- cognitive delays/learning disabilities  Employment/financial issues Patient works at [employer] on [address] 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   Trauma/Abuse history Have you ever experienced or been exposed to any form of abuse? No Have you ever experienced or been exposed to something traumatic? No  Substance use Do you use alcohol, nicotine or caffeine? Denies any substance use other than caffeine at this time How old were you when you first tasted alcohol? unsure Have you ever used illicit drugs or abused prescription medications? denies  Mental status General appearance/Behavior: Casual Eye contact: Good Motor behavior: Normal Speech: Normal Level of consciousness: Alert Mood: Anxious Affect: Constricted Anxiety level: Moderate Thought process: Tangential Thought content: WNL Perception: Normal Judgment: Poor Insight:  Absent  Diagnosis Generalized Anxiety Disorder  GOALS ADDRESSED: Patient will reduce symptoms of: anxiety  INTERVENTIONS: Interventions utilized: Solution-Focused Strategies and Supportive Counseling  ASSESSMENT/OUTCOME: Patient's anxiety seems to be generalized at this point, and is not subsiding after a few months. Therefore, the most appropriate diagnosis is Generalized Anxiety Disorder. Counselor explored with patient his recent experiences. Patient identified that his holidays went well, though his step-sister moved out of his dad's house on Christmas day, which was stressful, and his dad got upset when he found out about sister being pregnant. He states that he is feeling pressure with putting on sister's baby shower, but acknowledged that he has about 4 or 5 months to plan. Counselor guided patient to focus on what he is experiencing in his own life. Patient shared that he was recently 45 minutes late to work because he was in line to buy a phone for a friend of his. Counselor pointed this out as an example of patient's tendency to put his own needs aside to do something for someone else. Counselor and patient explored this as a pattern of behavior. Counselor encouraged patient to identify his own goals and needs. Counselor and patient created a worksheet for patient to explore what he needs and set goals for himself.  PLAN: Continue monthly CBT/ACT sessions  Scheduled next visit: 08/12/18  Reita Cliche Counselor

## 2018-07-17 ENCOUNTER — Ambulatory Visit (INDEPENDENT_AMBULATORY_CARE_PROVIDER_SITE_OTHER): Payer: 59 | Admitting: Licensed Clinical Social Worker

## 2018-07-17 ENCOUNTER — Other Ambulatory Visit (HOSPITAL_COMMUNITY)
Admission: RE | Admit: 2018-07-17 | Discharge: 2018-07-17 | Disposition: A | Discharge status: Home / Self Care | Payer: 59 | Source: Ambulatory Visit | Attending: Infectious Disease | Admitting: Infectious Disease

## 2018-07-17 ENCOUNTER — Ambulatory Visit: Payer: 59 | Admitting: Infectious Disease

## 2018-07-17 VITALS — BP 138/85 | HR 89 | Temp 98.1°F | Wt 201.0 lb

## 2018-07-17 DIAGNOSIS — Z113 Encounter for screening for infections with a predominantly sexual mode of transmission: Secondary | ICD-10-CM | POA: Diagnosis not present

## 2018-07-17 DIAGNOSIS — Z23 Encounter for immunization: Secondary | ICD-10-CM | POA: Diagnosis not present

## 2018-07-17 DIAGNOSIS — Z8619 Personal history of other infectious and parasitic diseases: Secondary | ICD-10-CM | POA: Diagnosis not present

## 2018-07-17 DIAGNOSIS — R6250 Unspecified lack of expected normal physiological development in childhood: Secondary | ICD-10-CM | POA: Diagnosis not present

## 2018-07-17 DIAGNOSIS — Q249 Congenital malformation of heart, unspecified: Secondary | ICD-10-CM

## 2018-07-17 DIAGNOSIS — Z79899 Other long term (current) drug therapy: Secondary | ICD-10-CM

## 2018-07-17 DIAGNOSIS — F411 Generalized anxiety disorder: Secondary | ICD-10-CM | POA: Diagnosis not present

## 2018-07-17 DIAGNOSIS — B20 Human immunodeficiency virus [HIV] disease: Secondary | ICD-10-CM

## 2018-07-17 DIAGNOSIS — Z9889 Other specified postprocedural states: Secondary | ICD-10-CM

## 2018-07-17 LAB — T-HELPER CELL (CD4) - (RCID CLINIC ONLY)
CD4 % Helper T Cell: 24 % — ABNORMAL LOW (ref 33–55)
CD4 T Cell Abs: 380 /uL — ABNORMAL LOW (ref 400–2700)

## 2018-07-17 NOTE — BH Specialist Note (Signed)
Integrated Behavioral Health Follow Up Visit  MRN: [id number] Name: Micheal Leonard   Session Start time: 9:53am Session End time: 10:25am Total time: 30 minutes  Type of Service: [place] Interpretor:No. Interpretor Name and Language: n/a  SUBJECTIVE: Micheal Leonard is a 23 y.o. male accompanied by self Patient reports the following symptoms/concerns: sadness, grief and worry for sister  OBJECTIVE: Mood: Dysthymic and Affect: Constricted Risk of harm to self or others: No plan to harm self or others  LIFE CONTEXT: Patient requested to see counselor after his appointment with Dr. Tommy Medal, and reports that after yesterday's session he learned that his sister had a miscarriage. He states that he has not been able to talk to her, and is experiencing confusing emotions.  GOALS ADDRESSED: Patient will: 1.  Demonstrate ability to: Increase healthy adjustment to current life circumstances  INTERVENTIONS: Interventions utilized:  Supportive Counseling  ASSESSMENT: Patient currently experiencing sadness and confusion in response to learning that his younger sister lost her baby. She was about [redacted] weeks along, and patient had already begun thinking about the baby shower he was tasked with planning for her. Counselor processed with patient how he found out about the miscarriage and who he has talked to about it. Patient states that his stepfather told him when they went to visit his mother on her dinner break from work, and he has tried to contact sister and dad (both of whom are in [place]) but has not heard back from either of them. He has talked to his uncle who is also local to sister, but uncle had not yet seen sister so could not tell him much about how she is. Counselor guided patient in exploring his emotions about the news. Patient kept returning to concern for his sister, stating that he cannot imagine th pain she must be going through, though  he did mention once that he was getting excited about being an uncle and now he is not going to be one. Counselor normalized and validated patient's concern for his sister, and his own feelings of grief. Counselor emphasized that it is okay to grieve someone he never met, but was anticipating meeting. Patient identified that he had been worried about sister's stress level affecting the baby, as she was getting very angry and losing her temper when she disagreed with the baby's father. Counselor educated patient on the prevalence of early-term miscarriages, and emphasized to him that there does not have to be a reason for this to take place. Counselor and patient explored ways that patient can be supportive to sister when he hears from her, and things that people often say which are meant to be helpful/comforting but often are not. Patient demonstrated understanding of avoiding these types of statements. Counselor emphasized to patient that it is okay for him to grieve, and it is okay for that to be non-linear. Patient agreed to call counselor if he needs to talk before next scheduled session in a few weeks.  Patient may benefit from continued CBT and supportive counseling sessions.  PLAN: 1. Follow up with behavioral health clinician on : 08/12/18   Lillie Fragmin, LCSW

## 2018-07-17 NOTE — Progress Notes (Signed)
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   Patient ID: Micheal Leonard, male    DOB: 1996/03/14, 23 y.o.   MRN: [id number]  HPI 24 year old with hx of cognitive delay, congenital heart disease sp fontane procedure at [facility] now admitted with acute HIV syndrome with cervical LA, fevers, and pharyngitis. He had workp that included HIV which was + with VL of half a million. CD4 was 180 but I suspect acutely suppressed in context of his infection as he had tested negative for HIV in the summer.   He was on [place] and Descovy and suppressed virus but had biilateral rash on arms and loose stools and then change to Genvoya after BIKTARVY was not OK with insurance.  We were worried about him having risk for VF with resistance and added Prezcobix to BIKTARVY. He did not like the symptoms he was having on this and taking 2 tablets. He was changed to [facility] by [facility] in May of 2019.   He was suppressed at that time.  He has been feeling his medications are to been shipped from [facility].  Since I last saw Leeum he has been having some difficulty with sleep due to erratic work schedule at [employer] where he tells me he frequently comes to work at different times on different days depending on how they schedule him this bothers him a bit and makes it difficult for him to sleep when he gets home as he stays awake for several hours after returning home after a long day of work.  He is states he is not been sexually active recently but did endorse receptive anal intercourse and oral sex around the time of his last birthday.  We had not screening for gonorrhea chlamydia in the oropharynx or rectum so we will do that today.  He is also due for flu shot and tetanus shot was reluctant to get either today.  Hopefully he ultimately got 1 I was very emphatic that he needed them.          Past Medical History:  Diagnosis Date  .  Cardiac anomaly, congenital   . Diarrhea 01/22/2017  . EBV infection    hx/notes 10/23/2016  . Hemoptysis 10/23/2016   [name] 10/23/2016  . Hypertension   . Migraine    "a few/year now; frequency is less than in the past" (10/23/2016)  . Pharyngitis 10/23/2016   notes 10/23/2016  . Pulmonary embolism (HCC) 01/29/2016  . S/P bidirectional Glenn shunt    hx/notes 10/23/2016  . S/P Fontan procedure    hx/notes 10/23/2016  . Tricuspid atresia and stenosis, congenital     Past Surgical History:  Procedure Laterality Date  . CARDIAC CATHETERIZATION  01/2016   at [facility]; hx/notes 10/23/2016  . CARDIAC SURGERY  01/1997; 2000   fontane procedure at [facility] as a child     Family History  Problem Relation Age of Onset  . Hypertension Father       Social History   Socioeconomic History  . Marital status: Significant Other    Spouse name: Not on file  . Number of children: Not on file  . Years of education: Not on file  . Highest education level: Not on file  Occupational History  . Not on file  Social Needs  . Financial resource strain: Not on file  . Food insecurity:    Worry: Not on file  Inability: Not on file  . Transportation needs:    Medical: Not on file    Non-medical: Not on file  Tobacco Use  . Smoking status: Never Smoker  . Smokeless tobacco: Never Used  . Tobacco comment: declined condoms  Substance and Sexual Activity  . Alcohol use: Yes    Comment: occ  . Drug use: No  . Sexual activity: Yes  Lifestyle  . Physical activity:    Days per week: Not on file    Minutes per session: Not on file  . Stress: Not on file  Relationships  . Social connections:    Talks on phone: Not on file    Gets together: Not on file    Attends religious service: Not on file    Active member of club or organization: Not on file    Attends meetings of clubs or organizations: Not on file    Relationship status: Not on file  Other Topics Concern  . Not on file  Social History  Narrative  . Not on file    No Known Allergies   Current Outpatient Medications:  .  SYMTUZA 800-150-200-10 MG TABS, TAKE 1 TABLET BY MOUTH DAILY WITH BREAKFAST., Disp: 30 tablet, Rfl: 0 .  doxycycline (VIBRAMYCIN) 100 MG capsule, Take 1 capsule (100 mg total) by mouth 2 (two) times daily. (Patient not taking: Reported on 07/17/2018), Disp: 14 capsule, Rfl: 0   Review of Systems  Constitutional: Negative for chills, diaphoresis and fever.  HENT: Negative for congestion, hearing loss, sore throat and tinnitus.   Eyes: Negative for photophobia.  Respiratory: Negative for cough, shortness of breath and wheezing.   Cardiovascular: Negative for chest pain, palpitations and leg swelling.  Gastrointestinal: Negative for abdominal pain, blood in stool, constipation, diarrhea, nausea and vomiting.  Genitourinary: Negative for dysuria, flank pain and hematuria.  Musculoskeletal: Negative for back pain and myalgias.  Skin: Negative for rash.  Neurological: Negative for dizziness, weakness and headaches.  Hematological: Does not bruise/bleed easily.  Psychiatric/Behavioral: Positive for sleep disturbance. Negative for suicidal ideas. The patient is not nervous/anxious.        Objective:   Physical Exam  Constitutional: He is oriented to person, place, and time. He appears well-developed and well-nourished. No distress.  HENT:  Head: Normocephalic and atraumatic.  Mouth/Throat: No oropharyngeal exudate.  Eyes: Conjunctivae and EOM are normal. No scleral icterus.  Neck: Normal range of motion. Neck supple.  Cardiovascular: Normal rate and regular rhythm.  Pulmonary/Chest: Effort normal. No respiratory distress. He has no wheezes.  Abdominal: He exhibits no distension.  Musculoskeletal:        General: No tenderness or edema.  Neurological: He is alert and oriented to person, place, and time. He exhibits normal muscle tone. Coordination normal.  Skin: Skin is warm and dry. No rash noted. He  is not diaphoretic. No erythema. No pallor.  Psychiatric: He has a normal mood and affect. His speech is normal. Judgment and thought content normal. He is slowed.          Assessment & Plan:   HIV disease: Continue SYMTUZA   History of STIs screen for gonorrhea chlamydia and oropharynx and rectum and urine  Congenital heart disease: continue to follow at [facility]  Cognitive delay: I am not quite sure the extent of this but at present he is seemingly reliable with Mother's coaching as far as his adherence  I spent greater than 40 minutes with the patient including greater than 50% of time in  face to face counsel of the patient on need to maintain high level of adherence to his medications, and in filling out forms for FMLA for his mother and in coordination of his care.

## 2018-07-20 ENCOUNTER — Telehealth: Payer: Self-pay

## 2018-07-20 LAB — CYTOLOGY, (ORAL, ANAL, URETHRAL) ANCILLARY ONLY
Chlamydia: NEGATIVE
Chlamydia: POSITIVE — AB
Neisseria Gonorrhea: NEGATIVE
Neisseria Gonorrhea: POSITIVE — AB

## 2018-07-20 LAB — URINE CYTOLOGY ANCILLARY ONLY
CHLAMYDIA, DNA PROBE: NEGATIVE
Neisseria Gonorrhea: NEGATIVE

## 2018-07-20 NOTE — Telephone Encounter (Signed)
Per Dr. Zenaida Niece dam Pt needs rx 250 mg IM Ceftriaxone and 1 gram azithromycin x 1. Partners need to be tested and treated and referred for PrEp. Called patient and left general VM to call office.

## 2018-07-21 ENCOUNTER — Other Ambulatory Visit: Payer: Self-pay

## 2018-07-21 DIAGNOSIS — A64 Unspecified sexually transmitted disease: Secondary | ICD-10-CM

## 2018-07-21 LAB — CBC WITH DIFFERENTIAL/PLATELET
Absolute Monocytes: 426 cells/uL (ref 200–950)
Basophils Absolute: 49 cells/uL (ref 0–200)
Basophils Relative: 1.2 %
Eosinophils Absolute: 217 cells/uL (ref 15–500)
Eosinophils Relative: 5.3 %
HCT: 48.1 % (ref 38.5–50.0)
Hemoglobin: 16.5 g/dL (ref 13.2–17.1)
Lymphs Abs: 1554 cells/uL (ref 850–3900)
MCH: 30.1 pg (ref 27.0–33.0)
MCHC: 34.3 g/dL (ref 32.0–36.0)
MCV: 87.8 fL (ref 80.0–100.0)
MPV: 12.4 fL (ref 7.5–12.5)
Monocytes Relative: 10.4 %
Neutro Abs: 1853 cells/uL (ref 1500–7800)
Neutrophils Relative %: 45.2 %
Platelets: 211 10*3/uL (ref 140–400)
RBC: 5.48 10*6/uL (ref 4.20–5.80)
RDW: 13.4 % (ref 11.0–15.0)
Total Lymphocyte: 37.9 %
WBC: 4.1 10*3/uL (ref 3.8–10.8)

## 2018-07-21 LAB — HIV-1 RNA QUANT-NO REFLEX-BLD
HIV 1 RNA Quant: 116 copies/mL — ABNORMAL HIGH
HIV-1 RNA Quant, Log: 2.06 Log copies/mL — ABNORMAL HIGH

## 2018-07-21 LAB — LIPID PANEL
CHOLESTEROL: 187 mg/dL (ref <200)
HDL: 47 mg/dL (ref >40)
LDL Cholesterol (Calc): 120 mg/dL (calc) — ABNORMAL HIGH
Non-HDL Cholesterol (Calc): 140 mg/dL (calc) — ABNORMAL HIGH (ref <130)
Total CHOL/HDL Ratio: 4 (calc) (ref <5.0)
Triglycerides: 102 mg/dL (ref <150)

## 2018-07-21 LAB — COMPLETE METABOLIC PANEL WITH GFR
AG Ratio: 1.5 (calc) (ref 1.0–2.5)
ALT: 25 U/L (ref 9–46)
AST: 29 U/L (ref 10–40)
Albumin: 4.9 g/dL (ref 3.6–5.1)
Alkaline phosphatase (APISO): 77 U/L (ref 40–115)
BUN: 10 mg/dL (ref 7–25)
CALCIUM: 9.6 mg/dL (ref 8.6–10.3)
CO2: 24 mmol/L (ref 20–32)
Chloride: 104 mmol/L (ref 98–110)
Creat: 0.89 mg/dL (ref 0.60–1.35)
GFR, EST NON AFRICAN AMERICAN: 122 mL/min/{1.73_m2} (ref >=60)
GFR, Est African American: 142 mL/min/{1.73_m2} (ref >=60)
Globulin: 3.3 g/dL (calc) (ref 1.9–3.7)
Glucose, Bld: 92 mg/dL (ref 65–99)
Potassium: 3.9 mmol/L (ref 3.5–5.3)
Sodium: 139 mmol/L (ref 135–146)
Total Bilirubin: 0.5 mg/dL (ref 0.2–1.2)
Total Protein: 8.2 g/dL — ABNORMAL HIGH (ref 6.1–8.1)

## 2018-07-21 LAB — RPR: RPR Ser Ql: NONREACTIVE

## 2018-07-21 MED ORDER — CEFTRIAXONE SODIUM 250 MG IJ SOLR: 250.0000 mg | Freq: Once | INTRAMUSCULAR | Status: DC

## 2018-07-21 MED ORDER — AZITHROMYCIN 250 MG PO TABS: 1000.0000 mg | ORAL_TABLET | Freq: Once | ORAL | Status: DC

## 2018-07-21 NOTE — Telephone Encounter (Signed)
error 

## 2018-07-21 NOTE — Telephone Encounter (Signed)
Patient called and made aware of results. Patient + for C/G.  Verbal Order per Dr. [name] Ceftriaxone 250mg  IM x 1 and Azithromycin 1 gram PO x 1. Advised patient to contact partner and make them aware to be tested and treated at local health department. Patient verbalized understanding of STI treatment and appointment. All questions/concerns answered.

## 2018-07-22 ENCOUNTER — Ambulatory Visit: Payer: 59

## 2018-07-23 ENCOUNTER — Ambulatory Visit (INDEPENDENT_AMBULATORY_CARE_PROVIDER_SITE_OTHER): Payer: 59 | Admitting: *Deleted

## 2018-07-23 DIAGNOSIS — A549 Gonococcal infection, unspecified: Secondary | ICD-10-CM

## 2018-07-23 DIAGNOSIS — A564 Chlamydial infection of pharynx: Secondary | ICD-10-CM

## 2018-07-23 MED ORDER — CEFTRIAXONE SODIUM 250 MG IJ SOLR
250.0000 mg | Freq: Once | INTRAMUSCULAR | Status: AC
  Administered 2018-07-23: 250 mg via INTRAMUSCULAR

## 2018-07-23 MED ORDER — AZITHROMYCIN 250 MG PO TABS
1000.0000 mg | ORAL_TABLET | Freq: Once | ORAL | Status: AC
  Administered 2018-07-23: 1000 mg via ORAL

## 2018-07-27 ENCOUNTER — Other Ambulatory Visit: Payer: Self-pay | Admitting: Infectious Disease

## 2018-07-30 MED FILL — SYMTUZA 800-150-200-10 MG T: 800-150-200 | 30 days supply | Qty: 30 | Fill #0

## 2018-07-31 ENCOUNTER — Telehealth: Payer: Self-pay | Admitting: *Deleted

## 2018-08-03 NOTE — Telephone Encounter (Signed)
Order received initially on 09/30/17 by Dr Ulyses Jarred RN to evaluate patient for [facility]).  In home visit made on 10/01/17 and evaluation completed with consent to care signed at this time.   Frequency / Duration of [facility] visits: Effective: 08/01/18: 24mo4, 4 PRN's for complications with disease process/progression, medication changes or concerns   [facility] will assess for learning needs related to diagnosis and treatment regimen, provide education as needed, fill pill box if needed, address any barriers which may be preventing or posing a risk to medication compliance, and communicating with care team including physician and case manager. Focus of each visit with be keeping the patient in care, medications access and assistance with removing potential or current barriers to care.  Individualized Plan Of Care 08/01/18 to 10/30/18  a. Type of service(s) and care to be delivered: RN Case Management  b. Frequency and duration of service: Effective 08/01/18: 67mo4, 4 prns for complications with disease process/progression, medication changes or concerns .   Visits/Contact may be conducted telephonically or in person to best suit the patient.  c. Activity restrictions: Pt may be up as tolerated and can safely ambulate without the need for a assistive device   d. Safety Measures: Standard Precautions/Infection Control  And Disease Management  e. Service Objectives and Goals: Service Objectives are to assist the pt with HIV medication regimen adherence and staying in care with the [facility] Disease [facility] by identifying barriers to care. RN  will address the barriers that are identified by the patient. Currently the patient states his goals would be to have a 1 pill regimen.  RN's focus will be on keeping the stated goals at the center of the plan of  care and explaining the patinet how to successful reach his goals with behavioral changes to support his goals.  Patient struggles with adherence when he feels unsupported.   f. Equipment required: No additional equipment needs at this time   g. Functional Limitations: Cognitive delay but functional  h. Rehabilitation potential: Guarded   i. Diet and Nutritional Needs: Regular Diet   j. Medications and treatments: Medications have been reconciled and reviewed and are a part of EPIC electronic file   k. Specific therapies if needed: RN   l. Pertinent diagnoses: New HIV disease   m. Expected outcome: Guarded

## 2018-08-04 NOTE — Telephone Encounter (Signed)
I do not mind if you still keep in the not super worried about his viral load going up a little bit it certainly not showing perfect adherence but it is not a disaster either.

## 2018-08-06 ENCOUNTER — Ambulatory Visit (INDEPENDENT_AMBULATORY_CARE_PROVIDER_SITE_OTHER): Payer: 59 | Admitting: Licensed Clinical Social Worker

## 2018-08-06 DIAGNOSIS — F4323 Adjustment disorder with mixed anxiety and depressed mood: Secondary | ICD-10-CM | POA: Diagnosis not present

## 2018-08-07 NOTE — BH Specialist Note (Signed)
Integrated Behavioral Health Follow Up Visit  MRN: [id number] Name: Micheal Leonard  Session Start time: 2:00pm  Session End time: 2:33pm Total time: 30 minutes  Type of Service: Integrated Behavioral Health- Individual/Family Interpretor:No. Interpretor Name and Language: n/a  SUBJECTIVE: Micheal Leonard is a 23 y.o. male accompanied by self Patient reports the following symptoms/concerns: anxiety, difficulty concentrating, difficulty making decisions   OBJECTIVE: Mood: Anxious and Affect: Constricted Risk of harm to self or others: No plan to harm self or others  LIFE CONTEXT: Patient reports that he has a new boyfriend; they have known each other for about a month and have been dating for a week. However, boyfriend currently still lives with his ex-girlfriend and has been talking about wanting to move into an apartment with patient in the coming months. Patient is working and living with mom, states that he is okay there and does not really need to move out.   GOALS ADDRESSED: Patient will: 1.  Reduce symptoms of: anxiety    INTERVENTIONS: Interventions utilized:  Solution-Focused Strategies and Supportive Counseling and Reality Therapy  ASSESSMENT: Patient currently experiencing anxiety and nervousness in relationship, difficulty making decisions, difficulty concentrating, struggles with self-concept. He reports that he is trying to decide when/whether to tell new boyfriend about his HIV status. Counselor guided patient to explore his thoughts and feelings about this situation. Patient states that boyfriend has only ever dated girls before and he worries that telling boyfriend he is HIV+ may leave him with bad ideas about relationships with men. Counselor emphasized that patient cannot control boyfriend's thoughts or impressions about relationships with men. Counselor encouraged patient to focus on what he wants and how disclosure/nondisclosure would impact his relationship with  boyfriend. Patient and counselor discussed patient's responsibility to disclose - he has an undetectable viral load so he has less requirement about disclosure. Counselor emphasized to patient that his status is his information to share when he feels it is best, but also pointed out that either decision may have impact on the relationship and how boyfriend sees him, which is up to boyfriend and not under patient's control. Counselor guided patient in discussing the pros and cons of disclosing his status. Patient stated that he thinks he would ideally wait a year to disclose, so that he knows he can trust boyfriend. Counselor assisted patient in developing a view of boyfriend's perspective. Counselor and patient explored issues of time in relationships, including trust and moving in together in addition to disclosure. Counselor emphasized to patient the importance of him not taking steps just to please boyfriend, if he is not ready for them.   Patient may benefit from ongoing CBT and Reality Therapy sessions twice monthly.  PLAN: 1. Follow up with behavioral health clinician on : 08/12/18  Angus Palms, LCSW

## 2018-08-12 ENCOUNTER — Ambulatory Visit (INDEPENDENT_AMBULATORY_CARE_PROVIDER_SITE_OTHER): Payer: 59 | Admitting: Licensed Clinical Social Worker

## 2018-08-12 DIAGNOSIS — F411 Generalized anxiety disorder: Secondary | ICD-10-CM

## 2018-08-12 NOTE — BH Specialist Note (Signed)
Integrated Behavioral Health Follow Up Visit  MRN: [id number] Name: Micheal Leonard   Session Start time: 10:00am  Session End time: 10:30am Total time: 30 minutes  Type of Service: Integrated Behavioral Health- Individual/Family Interpretor:No. Interpretor Name and Language: n/a  SUBJECTIVE: Micheal Leonard is a 23 y.o. male accompanied by self Patient reports the following symptoms/concerns: anxiety about future of relationship, restlessness, feelings of guilt, difficulty making decisions OBJECTIVE: Mood: Anxious and Affect: Constricted Risk of harm to self or others: No plan to harm self or others  LIFE CONTEXT: Patient reports that he talked to boyfriend about his HIV status, and boyfriend was surprisingly accepting/supportive. He states that they are still thinking of moving in together by May, and that they have recently gotten a dog together. Patient reports that work is going well, and that he is working as many hours as he wants, now. No problems with relationship with mother, and sister seems to be doing okay following the miscarriage, though patient has not actually seen her since.   GOALS ADDRESSED: Patient will: 1.  Reduce symptoms of: anxiety   INTERVENTIONS: Interventions utilized:  Brief CBT and Supportive Counseling  ASSESSMENT: Patient currently experiencing anxiety regarding relationship, restlessness, guilty feelings, and difficulty making decisions. Counselor processed with patient the experience of disclosing to new boyfriend, and his thoughts/feelings about boyfriend's response. Patient reports being surprised that boyfriend said patient didn't have to tell him, but states that he assumes this means he knows patient is taking good care of himself (I.e. taking meds, undetectable, etc) and in this way is protecting boyfriend as well. He states that maybe he was overthinking telling boyfriend.  Counselor emphasized the importance of thinking through disclosure  decisions and encouraged patient to be proud of himself for his courage and honestly in sharing this information. Patient indicated that he has not always been making good decisions lately, and shared that he slept with his ex-boyfriend yesterday. Counselor guided patient in functional analysis of the situation. Counselor processed with patient his thoughts and his feelings about sleeping with ex. Patient expressed feelings of guilt, beating himself up, and fear that boyfriend would break up with him if he found out. Counselor explored with patient the events that led to this decision; ex-boyfriend is still a friend and often comes over to play video games and hang out, but this has not led to sex in the past. Counselor guided patient to identify why things were different last night. Patient stated that he and ex were drinking and smoking marijuana, which he usually does not do at the same time. He said he and ex-boyfriend agreed that this cannot/will not happen again, but then patient made a statement that it is difficult because the other person has to be on board as well to make sure it doesn't happen again. Counselor emphasized to patient that he has as much power and say in the situation as the other party; if ex tries to make it happen again, patient has the right to say no, walk out, etc, if he does not want to participate.   Patient may benefit from ongoing counseling sessions twice monthly.  PLAN: 1. Follow up with behavioral health clinician on : 08/25/18   Angus Palms, LCSW

## 2018-08-25 ENCOUNTER — Ambulatory Visit: Payer: 59 | Admitting: Licensed Clinical Social Worker

## 2018-08-26 MED FILL — SYMTUZA 800-150-200-10 MG T: 800-150-200 | 30 days supply | Qty: 30 | Fill #1

## 2018-09-04 ENCOUNTER — Telehealth: Payer: Self-pay | Admitting: *Deleted

## 2018-09-04 NOTE — Telephone Encounter (Signed)
Reached out to Micheal Leonard today after noticing he missed the 08/25/18 appt with [facility] for counseling. I think visits with Rene Kocher could be really helpful for Micheal Leonard. Micheal Leonard agreed to reschedule his appt for Tuesday and declined transportation. I really encouraged Micheal Leonard to come and reminded him that we ([facility]) cares and are centered around keeping him healthy holistically. Visit with Rene Kocher rescheduled at this time.

## 2018-09-08 ENCOUNTER — Ambulatory Visit: Payer: 59 | Admitting: Licensed Clinical Social Worker

## 2018-09-09 ENCOUNTER — Ambulatory Visit: Payer: 59 | Admitting: Licensed Clinical Social Worker

## 2018-09-21 MED FILL — SYMTUZA 800-150-200-10 MG T: 800-150-200 | 30 days supply | Qty: 30 | Fill #2

## 2018-09-22 ENCOUNTER — Encounter (HOSPITAL_COMMUNITY): Payer: Self-pay | Admitting: *Deleted

## 2018-09-22 ENCOUNTER — Emergency Department (HOSPITAL_COMMUNITY)
Admission: EM | Admit: 2018-09-22 | Discharge: 2018-09-23 | Disposition: A | Discharge status: Home / Self Care | Payer: 59 | Attending: Emergency Medicine | Admitting: Emergency Medicine

## 2018-09-22 ENCOUNTER — Other Ambulatory Visit: Payer: Self-pay

## 2018-09-22 DIAGNOSIS — Y939 Activity, unspecified: Secondary | ICD-10-CM | POA: Diagnosis not present

## 2018-09-22 DIAGNOSIS — W272XXA Contact with scissors, initial encounter: Secondary | ICD-10-CM | POA: Diagnosis not present

## 2018-09-22 DIAGNOSIS — S61211A Laceration without foreign body of left index finger without damage to nail, initial encounter: Secondary | ICD-10-CM | POA: Insufficient documentation

## 2018-09-22 DIAGNOSIS — Y929 Unspecified place or not applicable: Secondary | ICD-10-CM | POA: Insufficient documentation

## 2018-09-22 DIAGNOSIS — Y990 Civilian activity done for income or pay: Secondary | ICD-10-CM | POA: Insufficient documentation

## 2018-09-22 DIAGNOSIS — Z23 Encounter for immunization: Secondary | ICD-10-CM | POA: Insufficient documentation

## 2018-09-22 NOTE — ED Triage Notes (Signed)
Pt reports he cut his left hand second finger with a scissors at work. Unknown tetanus

## 2018-09-23 MED ORDER — LIDOCAINE HCL (PF) 1 % IJ SOLN: 2.0000 mL | Freq: Once | INTRAMUSCULAR | Status: DC

## 2018-09-23 MED ORDER — TETANUS-DIPHTH-ACELL PERTUSSIS 5-2.5-18.5 LF-MCG/0.5 IM SUSP
0.5000 mL | Freq: Once | INTRAMUSCULAR | Status: AC
  Administered 2018-09-23: 0.5 mL via INTRAMUSCULAR
  Filled 2018-09-23: qty 0.5

## 2018-09-23 NOTE — Discharge Instructions (Addendum)
You were seen in the ER for small laceration to your left index finger.  You declined suture repair here.  Tetanus was updated.  Keep the wound clean with clean water and soap.  Apply thin layer of antibiotic ointment and keep covered until the scab/crusting forms.  Once the scab forms you may leave the wound uncovered.  Monitor and return to the ER for any signs of infection such as increased pain, swelling, redness, pus, fevers.

## 2018-09-23 NOTE — ED Provider Notes (Signed)
[facility] EMERGENCY DEPARTMENT Provider Note   CSN: [id number] Arrival date & time: 09/22/18  2328    History   Chief Complaint Chief Complaint  Patient presents with  . Finger Injury    HPI Micheal Leonard is a 23 y.o. male is here for evaluation of injury sustained while at work.  Patient was closing and cleaning up at work when he accidentally cut the palmar aspect of his left index finger with scissors.  There is still some small oozing of blood.  He is right-hand dominant.  Unknown tetanus status.  Denies distal paresthesias or loss of sensation.  No anticoagulants.     HPI  Past Medical History:  Diagnosis Date  . Cardiac anomaly, congenital   . Diarrhea 01/22/2017  . EBV infection    hx/notes 10/23/2016  . Hemoptysis 10/23/2016   [name] 10/23/2016  . Hypertension   . Migraine    "a few/year now; frequency is less than in the past" (10/23/2016)  . Pharyngitis 10/23/2016   notes 10/23/2016  . Pulmonary embolism (HCC) 01/29/2016  . S/P bidirectional Glenn shunt    hx/notes 10/23/2016  . S/P Fontan procedure    hx/notes 10/23/2016  . Tricuspid atresia and stenosis, congenital     Patient Active Problem List   Diagnosis Date Noted  . Diarrhea 01/22/2017  . Bleeding gums 11/02/2016  . Sinus tachycardia 11/02/2016  . Cognitive developmental delay 10/25/2016  . Pulmonary embolism (HCC) 10/24/2016  . HIV disease (HCC) 10/24/2016  . Chlamydial infection of pharynx 10/23/2016  . S/P Fontan procedure 10/23/2016  . S/P bidirectional Glenn shunt 10/23/2016  . Tricuspid atresia with normal great arteries 10/23/2016  . Proteinuria 10/23/2016    Past Surgical History:  Procedure Laterality Date  . CARDIAC CATHETERIZATION  01/2016   at [facility]; hx/notes 10/23/2016  . CARDIAC SURGERY  01/1997; 2000   fontane procedure at [facility] as a child         Home Medications    Prior to Admission medications   Medication Sig Start Date End Date Taking?  Authorizing Provider  doxycycline (VIBRAMYCIN) 100 MG capsule Take 1 capsule (100 mg total) by mouth 2 (two) times daily. Patient not taking: Reported on 07/17/2018 01/11/18   Petrucelli, Samantha R, PA-C  SYMTUZA 800-150-200-10 MG TABS TAKE 1 TABLET BY MOUTH DAILY WITH BREAKFAST. 07/27/18   Randall Hiss, MD    Family History Family History  Problem Relation Age of Onset  . Hypertension Father     Social History Social History   Tobacco Use  . Smoking status: Never Smoker  . Smokeless tobacco: Never Used  . Tobacco comment: declined condoms  Substance Use Topics  . Alcohol use: Yes    Comment: occ  . Drug use: No     Allergies   Patient has no known allergies.   Review of Systems Review of Systems  Skin: Positive for wound.  All other systems reviewed and are negative.    Physical Exam Updated Vital Signs BP (!) 164/93 (BP Location: Right Arm)   Pulse (!) 102   Temp 98.1 F (36.7 C) (Oral)   Resp 18   SpO2 95%   Physical Exam Constitutional:      Appearance: He is well-developed. He is not toxic-appearing.  HENT:     Head: Normocephalic.     Right Ear: External ear normal.     Left Ear: External ear normal.     Nose: Nose normal.  Eyes:  Conjunctiva/sclera: Conjunctivae normal.  Neck:     Musculoskeletal: Full passive range of motion without pain.  Cardiovascular:     Rate and Rhythm: Normal rate.     Comments: Brisk cap refill to distal left index finger pad Pulmonary:     Effort: Pulmonary effort is normal. No tachypnea or respiratory distress.  Musculoskeletal: Normal range of motion.     Comments: Full flexion/extension of left index finger against resistance   Skin:    General: Skin is warm and dry.     Capillary Refill: Capillary refill takes less than 2 seconds.     Comments: Straight laceration approx 1.5 cm vertically across flexor crease of DIP of left index finger, some oozing of blood noted with range of motion.  No overlaying  erythema, edema, warmth, purulence   Neurological:     Mental Status: He is alert and oriented to person, place, and time.     Comments: Sensation to light touch intact in the left distal index finger pad  Psychiatric:        Behavior: Behavior normal.        Thought Content: Thought content normal.      ED Treatments / Results  Labs (all labs ordered are listed, but only abnormal results are displayed) Labs Reviewed - No data to display  EKG None  Radiology No results found.  Procedures Procedures (including critical care time)  Medications Ordered in ED Medications  Tdap (BOOSTRIX) injection 0.5 mL (has no administration in time range)     Initial Impression / Assessment and Plan / ED Course  I have reviewed the triage vital signs and the nursing notes.  Pertinent labs & imaging results that were available during my care of the patient were reviewed by me and considered in my medical decision making (see chart for details).       23 year old presents with injury sustained while at work.  Small laceration to distal bruising noted on the flexor crease the left index finger.  Tetanus was updated here.  Bottom of the wound visualized with out foreign body seen or palpated.  No signs of tendon or nerve injury noted.  Full range of motion of the affected finger.  Given mechanism of injury, I do not think there is emergent need for x-ray.  I doubt foreign bodies embedded into the tissues.  Laceration was small but given its location and continued oozing I recommended 1 suture debris.  However patient declined this.  He opted for dressing.  We will discharge with wound care instructions, dressing, splint for protection.  Return precautions were given.  Patient is in agreement with this.  Final Clinical Impressions(s) / ED Diagnoses   Final diagnoses:  Laceration of left index finger without foreign body without damage to nail, initial encounter    ED Discharge Orders    None        Jerrell Mylar 09/23/18 0042    Palumbo, April, MD 09/23/18 [phone]

## 2018-10-13 ENCOUNTER — Encounter: Payer: Self-pay | Admitting: *Deleted

## 2018-10-16 ENCOUNTER — Encounter (HOSPITAL_COMMUNITY): Payer: Self-pay | Admitting: Emergency Medicine

## 2018-10-16 ENCOUNTER — Emergency Department (HOSPITAL_COMMUNITY)
Admission: EM | Admit: 2018-10-16 | Discharge: 2018-10-16 | Disposition: A | Discharge status: Home / Self Care | Payer: 59 | Attending: Emergency Medicine | Admitting: Emergency Medicine

## 2018-10-16 ENCOUNTER — Emergency Department (HOSPITAL_COMMUNITY): Payer: 59

## 2018-10-16 ENCOUNTER — Other Ambulatory Visit: Payer: Self-pay

## 2018-10-16 DIAGNOSIS — I10 Essential (primary) hypertension: Secondary | ICD-10-CM | POA: Insufficient documentation

## 2018-10-16 DIAGNOSIS — R1084 Generalized abdominal pain: Secondary | ICD-10-CM | POA: Diagnosis not present

## 2018-10-16 DIAGNOSIS — B20 Human immunodeficiency virus [HIV] disease: Secondary | ICD-10-CM | POA: Diagnosis not present

## 2018-10-16 DIAGNOSIS — Z86718 Personal history of other venous thrombosis and embolism: Secondary | ICD-10-CM | POA: Diagnosis not present

## 2018-10-16 HISTORY — DX: Asymptomatic human immunodeficiency virus (hiv) infection status: Z21

## 2018-10-16 HISTORY — DX: Human immunodeficiency virus (HIV) disease: B20

## 2018-10-16 LAB — GRAM STAIN

## 2018-10-16 LAB — COMPREHENSIVE METABOLIC PANEL
ALT: 26 U/L (ref 0–44)
AST: 32 U/L (ref 15–41)
Albumin: 4.4 g/dL (ref 3.5–5.0)
Alkaline Phosphatase: 93 U/L (ref 38–126)
Anion gap: 11 (ref 5–15)
BUN: 9 mg/dL (ref 6–20)
CO2: 24 mmol/L (ref 22–32)
Calcium: 9.7 mg/dL (ref 8.9–10.3)
Chloride: 102 mmol/L (ref 98–111)
Creatinine, Ser: 0.93 mg/dL (ref 0.61–1.24)
GFR calc Af Amer: >60 mL/min (ref >60)
GFR calc non Af Amer: >60 mL/min (ref >60)
Glucose, Bld: 93 mg/dL (ref 70–99)
Potassium: 4.3 mmol/L (ref 3.5–5.1)
Sodium: 137 mmol/L (ref 135–145)
Total Bilirubin: 0.4 mg/dL (ref 0.3–1.2)
Total Protein: 8.5 g/dL — ABNORMAL HIGH (ref 6.5–8.1)

## 2018-10-16 LAB — URINALYSIS, ROUTINE W REFLEX MICROSCOPIC
Bilirubin Urine: NEGATIVE
Glucose, UA: NEGATIVE mg/dL
Hgb urine dipstick: NEGATIVE
Ketones, ur: NEGATIVE mg/dL
Nitrite: NEGATIVE
Protein, ur: NEGATIVE mg/dL
Specific Gravity, Urine: 1.023 (ref 1.005–1.030)
pH: 6 (ref 5.0–8.0)

## 2018-10-16 LAB — CBC WITH DIFFERENTIAL/PLATELET
Abs Immature Granulocytes: 0.01 10*3/uL (ref 0.00–0.07)
Basophils Absolute: 0.1 10*3/uL (ref 0.0–0.1)
Basophils Relative: 1 %
Eosinophils Absolute: 0.6 10*3/uL — ABNORMAL HIGH (ref 0.0–0.5)
Eosinophils Relative: 10 %
HCT: 50.9 % (ref 39.0–52.0)
Hemoglobin: 16.4 g/dL (ref 13.0–17.0)
Immature Granulocytes: 0 %
Lymphocytes Relative: 34 %
Lymphs Abs: 2 10*3/uL (ref 0.7–4.0)
MCH: 29.3 pg (ref 26.0–34.0)
MCHC: 32.2 g/dL (ref 30.0–36.0)
MCV: 90.9 fL (ref 80.0–100.0)
Monocytes Absolute: 0.7 10*3/uL (ref 0.1–1.0)
Monocytes Relative: 12 %
Neutro Abs: 2.5 10*3/uL (ref 1.7–7.7)
Neutrophils Relative %: 43 %
Platelets: 233 10*3/uL (ref 150–400)
RBC: 5.6 MIL/uL (ref 4.22–5.81)
RDW: 12.9 % (ref 11.5–15.5)
WBC: 5.9 10*3/uL (ref 4.0–10.5)
nRBC: 0 % (ref 0.0–0.2)

## 2018-10-16 LAB — LIPASE, BLOOD: Lipase: 29 U/L (ref 11–51)

## 2018-10-16 MED ORDER — AZITHROMYCIN 1 G PO PACK
1.0000 g | PACK | Freq: Once | ORAL | Status: AC
  Administered 2018-10-16: 22:00:00 1 g via ORAL
  Filled 2018-10-16: qty 1

## 2018-10-16 MED ORDER — MORPHINE SULFATE (PF) 4 MG/ML IV SOLN
4.0000 mg | Freq: Once | INTRAVENOUS | Status: AC
  Administered 2018-10-16: 4 mg via INTRAVENOUS
  Filled 2018-10-16: qty 1

## 2018-10-16 MED ORDER — CEFTRIAXONE SODIUM 250 MG IJ SOLR
250.0000 mg | Freq: Once | INTRAMUSCULAR | Status: AC
  Administered 2018-10-16: 22:00:00 250 mg via INTRAMUSCULAR
  Filled 2018-10-16: qty 250

## 2018-10-16 MED ORDER — IOHEXOL 300 MG/ML  SOLN
100.0000 mL | Freq: Once | INTRAMUSCULAR | Status: AC | PRN
  Administered 2018-10-16: 20:00:00 100 mL via INTRAVENOUS

## 2018-10-16 MED ORDER — ALUM & MAG HYDROXIDE-SIMETH 200-200-20 MG/5ML PO SUSP
30.0000 mL | Freq: Once | ORAL | Status: AC
  Administered 2018-10-16: 30 mL via ORAL
  Filled 2018-10-16: qty 30

## 2018-10-16 MED ORDER — ONDANSETRON HCL 4 MG/2ML IJ SOLN
4.0000 mg | Freq: Once | INTRAMUSCULAR | Status: AC
  Administered 2018-10-16: 19:00:00 4 mg via INTRAVENOUS
  Filled 2018-10-16: qty 2

## 2018-10-16 MED ORDER — LIDOCAINE VISCOUS HCL 2 % MT SOLN
15.0000 mL | Freq: Once | OROMUCOSAL | Status: AC
  Administered 2018-10-16: 15 mL via ORAL
  Filled 2018-10-16: qty 15

## 2018-10-16 MED ORDER — LIDOCAINE HCL (PF) 1 % IJ SOLN
INTRAMUSCULAR | Status: AC
  Administered 2018-10-16: 22:00:00 0.9 mL
  Filled 2018-10-16: qty 5

## 2018-10-16 MED ORDER — LACTATED RINGERS IV BOLUS
1000.0000 mL | Freq: Once | INTRAVENOUS | Status: AC
  Administered 2018-10-16: 19:00:00 1000 mL via INTRAVENOUS

## 2018-10-16 NOTE — ED Notes (Signed)
ED Provider at bedside. 

## 2018-10-16 NOTE — ED Triage Notes (Signed)
Pt reports generalized abd pain for the last 3 days. Denvis n/v/d or fevers. Pt states he at times he feels unable to have a BM.

## 2018-10-16 NOTE — ED Provider Notes (Signed)
[facility] EMERGENCY DEPARTMENT Provider Note   CSN: [id number] Arrival date & time: 10/16/18  1731    History   Chief Complaint Chief Complaint  Patient presents with  . Abdominal Pain    HPI Micheal Leonard is a 23 y.o. male.      Abdominal Pain  Pain location:  Generalized Pain quality: aching, burning and cramping   Pain radiates to:  Does not radiate Pain severity:  Severe Onset quality:  Gradual Duration:  3 days Timing:  Constant Chronicity:  New Exacerbated by: Lying flat. Associated symptoms: anorexia, dysuria and nausea   Associated symptoms: no chest pain, no chills, no constipation, no cough, no diarrhea, no fever, no hematuria, no shortness of breath, no sore throat and no vomiting     Past Medical History:  Diagnosis Date  . Cardiac anomaly, congenital   . Diarrhea 01/22/2017  . EBV infection    hx/notes 10/23/2016  . Hemoptysis 10/23/2016   [name] 10/23/2016  . HIV (human immunodeficiency virus infection) (HCC)   . Hypertension   . Migraine    "a few/year now; frequency is less than in the past" (10/23/2016)  . Pharyngitis 10/23/2016   notes 10/23/2016  . Pulmonary embolism (HCC) 01/29/2016  . S/P bidirectional Glenn shunt    hx/notes 10/23/2016  . S/P Fontan procedure    hx/notes 10/23/2016  . Tricuspid atresia and stenosis, congenital     Patient Active Problem List   Diagnosis Date Noted  . Diarrhea 01/22/2017  . Bleeding gums 11/02/2016  . Sinus tachycardia 11/02/2016  . Cognitive developmental delay 10/25/2016  . Pulmonary embolism (HCC) 10/24/2016  . HIV disease (HCC) 10/24/2016  . Chlamydial infection of pharynx 10/23/2016  . S/P Fontan procedure 10/23/2016  . S/P bidirectional Glenn shunt 10/23/2016  . Tricuspid atresia with normal great arteries 10/23/2016  . Proteinuria 10/23/2016    Past Surgical History:  Procedure Laterality Date  . CARDIAC CATHETERIZATION  01/2016   at [facility]; hx/notes 10/23/2016  .  CARDIAC SURGERY  01/1997; 2000   fontane procedure at [facility] as a child         Home Medications    Prior to Admission medications   Medication Sig Start Date End Date Taking? Authorizing Provider  SYMTUZA 800-150-200-10 MG TABS TAKE 1 TABLET BY MOUTH DAILY WITH BREAKFAST. Patient not taking: Reported on 10/16/2018 07/27/18   [name], Lisette Grinder, MD    Family History Family History  Problem Relation Age of Onset  . Hypertension Father     Social History Social History   Tobacco Use  . Smoking status: Never Smoker  . Smokeless tobacco: Never Used  . Tobacco comment: declined condoms  Substance Use Topics  . Alcohol use: Yes    Comment: occ  . Drug use: No     Allergies   Patient has no known allergies.   Review of Systems Review of Systems  Constitutional: Negative for chills and fever.  HENT: Negative for ear pain and sore throat.   Eyes: Negative for pain and visual disturbance.  Respiratory: Negative for cough and shortness of breath.   Cardiovascular: Negative for chest pain and palpitations.  Gastrointestinal: Positive for abdominal pain, anorexia and nausea. Negative for constipation, diarrhea and vomiting.  Genitourinary: Positive for dysuria. Negative for decreased urine volume, discharge, hematuria, penile pain, scrotal swelling and testicular pain.  Musculoskeletal: Negative for arthralgias and back pain.  Skin: Negative for color change and rash.  Neurological: Negative for seizures and syncope.  All other systems reviewed and are negative.    Physical Exam Updated Vital Signs BP (!) 149/94   Pulse 71   Temp 98.3 F (36.8 C) (Oral)   Resp (!) 21   SpO2 97%   Physical Exam Vitals signs and nursing note reviewed.  Constitutional:      Appearance: He is well-developed.  HENT:     Head: Normocephalic and atraumatic.  Eyes:     Conjunctiva/sclera: Conjunctivae normal.  Neck:     Musculoskeletal: Neck supple.  Cardiovascular:     Rate and  Rhythm: Normal rate and regular rhythm.     Heart sounds: No murmur.  Pulmonary:     Effort: Pulmonary effort is normal. No respiratory distress.     Breath sounds: Normal breath sounds.  Abdominal:     Palpations: There is no mass.     Comments: Soft and nondistended.  Mild tenderness to palpation diffusely.  No rebound or guarding is present.  No peritoneal signs.  Musculoskeletal: Normal range of motion.     Right lower leg: No edema.     Left lower leg: No edema.  Skin:    General: Skin is warm and dry.  Neurological:     General: No focal deficit present.     Mental Status: He is alert and oriented to person, place, and time.      ED Treatments / Results  Labs (all labs ordered are listed, but only abnormal results are displayed) Labs Reviewed  CBC WITH DIFFERENTIAL/PLATELET - Abnormal; Notable for the following components:      Result Value   Eosinophils Absolute 0.6 (*)    All other components within normal limits  COMPREHENSIVE METABOLIC PANEL - Abnormal; Notable for the following components:   Total Protein 8.5 (*)    All other components within normal limits  URINALYSIS, ROUTINE W REFLEX MICROSCOPIC - Abnormal; Notable for the following components:   Leukocytes,Ua TRACE (*)    Bacteria, UA RARE (*)    All other components within normal limits  GRAM STAIN  URINE CULTURE  LIPASE, BLOOD  GC/CHLAMYDIA PROBE AMP ([place]) NOT AT [facility]    EKG EKG Interpretation  Date/Time:  Friday October 16 2018 18:55:16 EDT Ventricular Rate:  95 PR Interval:    QRS Duration: 108 QT Interval:  332 QTC Calculation: 418 R Axis:   -16 Text Interpretation:  Sinus rhythm Borderline short PR interval Probable left ventricular hypertrophy Nonspecific T abnormalities, inferior leads No acute changes Confirmed by Marily MemosMesner, Jason [id number]) on 10/16/2018 6:58:58 PM   Radiology Ct Abdomen Pelvis W Contrast  Result Date: 10/16/2018 CLINICAL DATA:  23 year old male with abdominal pain.  Concern for gastroenteritis or colitis. EXAM: CT ABDOMEN AND PELVIS WITH CONTRAST TECHNIQUE: Multidetector CT imaging of the abdomen and pelvis was performed using the standard protocol following bolus administration of intravenous contrast. CONTRAST:  100mL OMNIPAQUE IOHEXOL 300 MG/ML  SOLN COMPARISON:  CT of the abdomen pelvis dated 01/28/2016 FINDINGS: Lower chest: The visualized lung bases are clear. No intra-abdominal free air or free fluid. Hepatobiliary: No focal liver abnormality is seen. No gallstones, gallbladder wall thickening, or biliary dilatation. Pancreas: Unremarkable. No pancreatic ductal dilatation or surrounding inflammatory changes. Spleen: Normal in size without focal abnormality. Adrenals/Urinary Tract: Adrenal glands are unremarkable. Kidneys are normal, without renal calculi, focal lesion, or hydronephrosis. Bladder is unremarkable. Stomach/Bowel: Mildly thickened appearance of the distal colon likely related to underdistention. Colitis is less likely. Clinical correlation is recommended. Nondilated fecalized loops of small  bowel in the lower abdomen may represent increased transit time or small intestinal bacterial overgrowth. No bowel obstruction. Normal appendix. Vascular/Lymphatic: No significant vascular findings are present. No enlarged abdominal or pelvic lymph nodes. Reproductive: The prostate and seminal vesicles are grossly unremarkable. Other: None Musculoskeletal: No acute or significant osseous findings. IMPRESSION: Underdistention of the distal colon versus less likely mild colitis. Clinical correlation is recommended. No bowel obstruction. Normal appendix. Electronically Signed   By: Elgie Collard M.D.   On: 10/16/2018 20:17    Procedures Procedures (including critical care time)  Medications Ordered in ED Medications  alum & mag hydroxide-simeth (MAALOX/MYLANTA) 200-200-20 MG/5ML suspension 30 mL (has no administration in time range)    And  lidocaine (XYLOCAINE)  2 % viscous mouth solution 15 mL (has no administration in time range)  cefTRIAXone (ROCEPHIN) injection 250 mg (has no administration in time range)  lidocaine (PF) (XYLOCAINE) 1 % injection (has no administration in time range)  lactated ringers bolus 1,000 mL (0 mLs Intravenous Stopped 10/16/18 2125)  ondansetron (ZOFRAN) injection 4 mg (4 mg Intravenous Given 10/16/18 1848)  morphine 4 MG/ML injection 4 mg (4 mg Intravenous Given 10/16/18 1848)  iohexol (OMNIPAQUE) 300 MG/ML solution 100 mL (100 mLs Intravenous Contrast Given 10/16/18 1954)  azithromycin (ZITHROMAX) powder 1 g (1 g Oral Given 10/16/18 2207)     Initial Impression / Assessment and Plan / ED Course  I have reviewed the triage vital signs and the nursing notes.  Pertinent labs & imaging results that were available during my care of the patient were reviewed by me and considered in my medical decision making (see chart for details).       Micheal Leonard is a 23 y.o. male with a hx of pulmonary embolism, congenital, tricuspid atresia and stenosis, HTN, and HIV, who presents the emergency department complaining of generalized abdominal pain for the past 3 days.  Denies any infectious complaints.  On initial evaluation of the patient he was hemodynamically stable and nontoxic-appearing.  Patient was slight tachycardic at 105, afebrile, hypertensive, satting appropriately on room air.  EKG shows normal sinus rhythm at 95 bpm with T wave inversions in lead III and aVF which are consistent with prior EKGs.  No acute ischemic changes are present.  Patient given IV morphine, IV fluid bolus, and IV Zofran for symptomatic relief while awaiting labs and imaging.  CBC with no leukocytosis or anemia.  There is also no evidence of lymphopenia or neutropenia.  CMP with no significant metabolic or electrolyte derangements.  Lipase within normal limits. UA unremarkable.   CT scan read as under distention of the distal colon versus less likely  mild colitis.  This patient has no history of bloody stools and no unintentional weight loss do not feel inflammatory colitis is likely.  Patient had resolution of pain with IV morphine.  On reevaluation he was resting comfortably on his phone in the room.  Given his history of sexual transmitted infections he was offered empiric treatment while awaiting results of STI panel.  Patient wished to receive treatment at this time.  He was given IM Rocephin and azithromycin in the emergency department.  I discussed concerning signs and symptoms that would necessitate return to the emergency department.  Patient voiced understanding of these instructions and had no further questions at this time.  Final Clinical Impressions(s) / ED Diagnoses   Final diagnoses:  Generalized abdominal pain    ED Discharge Orders    None  Leonette Monarch, MD 10/16/18 2211    Marily Memos, MD 10/16/18 2231

## 2018-10-17 LAB — URINE CULTURE: Culture: NO GROWTH

## 2018-11-05 ENCOUNTER — Telehealth: Payer: Self-pay | Admitting: *Deleted

## 2018-11-05 MED FILL — SYMTUZA 800-150-200-10 MG T: 800-150-200 | 30 days supply | Qty: 30 | Fill #3

## 2018-11-05 NOTE — Telephone Encounter (Addendum)
PRIOR ORDERS ENDED ON 10/29/17 WITH REQUEST FOR NEW ORDERS MADE ON 11/04/17. A VISIT WITH THE PATIENT HAS NOT BEEN MADE WITHOUT ORDERS IN PLACE Order received initially on 09/30/17 by Dr Ulyses Jarred RN to evaluate patient for [facility]).  In home visit made on 10/01/17 and evaluation completed with consent to care signed at this time.   Frequency / Duration of CBHCN visits: Effective: 08/01/18: 68mo4, 4 PRN's for complications with disease process/progression, medication changes or concerns  At this time I would like to continue to have active orders for [facility]. My focus will be on his nonadherence to his appts and I would like to offer assistance with him keeping his appt with Dr [name] on 11/30/18.   CBHCN will assess for learning needs related to diagnosis and treatment regimen, provide education as needed, fill pill box if needed, address any barriers which may be preventing or posing a risk to medication compliance, and communicating with care team including physician and case manager. Focus of each visit with be keeping the patient in care, medications access and assistance with removing potential or current barriers to care.  Individualized Plan Of Care 10/31/18 to 01/29/19  a. Type of service(s) and care to be delivered: RN Case Management  b. Frequency and duration of service: Effective 10/31/18: 39mo4, 4 prns for complications with disease process/progression, medication changes or concerns .   Visits/Contact may be conducted telephonically or in person to best suit the patient.  c. Activity restrictions: Pt may be up as tolerated and can safely ambulate without the need for a assistive device   d. Safety Measures: Standard Precautions/Infection Control  And Disease Management  e. Service Objectives and Goals: Service Objectives are to assist the pt with HIV medication regimen adherence and staying in care with the Infectious Disease Clinic by  identifying barriers to care. RN  will address the barriers that are identified by the patient. Currently the patient states his goals would be to have a 1 pill regimen.  RN's focus will be on keeping the stated goals at the center of the plan of  care and explaining the patinet how to successful reach his goals with behavioral changes to support his goals. Patient struggles with adherence when he feels unsupported.   f. Equipment required: No additional equipment needs at this time   g. Functional Limitations: Cognitive delay but functional  h. Rehabilitation potential: Guarded   i. Diet and Nutritional Needs: Regular Diet   j. Medications and treatments: Medications have been reconciled and reviewed and are a part of EPIC electronic file   k. Specific therapies if needed: RN   l. Pertinent diagnoses: New HIV disease   m. Expected outcome: Guarded

## 2018-11-06 NOTE — Telephone Encounter (Signed)
I agree with this plan and approve of it.

## 2018-11-16 ENCOUNTER — Other Ambulatory Visit: Payer: 59

## 2018-11-17 ENCOUNTER — Other Ambulatory Visit: Payer: Self-pay

## 2018-11-17 ENCOUNTER — Other Ambulatory Visit: Payer: 59

## 2018-11-17 DIAGNOSIS — B20 Human immunodeficiency virus [HIV] disease: Secondary | ICD-10-CM

## 2018-11-18 LAB — T-HELPER CELL (CD4) - (RCID CLINIC ONLY)
CD4 % Helper T Cell: 23 % — ABNORMAL LOW (ref 33–65)
CD4 T Cell Abs: 395 /uL — ABNORMAL LOW (ref 400–1790)

## 2018-11-21 LAB — CBC WITH DIFFERENTIAL/PLATELET
Absolute Monocytes: 525 cells/uL (ref 200–950)
Basophils Absolute: 39 cells/uL (ref 0–200)
Basophils Relative: 0.9 %
Eosinophils Absolute: 374 cells/uL (ref 15–500)
Eosinophils Relative: 8.7 %
HCT: 47.1 % (ref 38.5–50.0)
Hemoglobin: 16 g/dL (ref 13.2–17.1)
Lymphs Abs: 1750 cells/uL (ref 850–3900)
MCH: 29.9 pg (ref 27.0–33.0)
MCHC: 34 g/dL (ref 32.0–36.0)
MCV: 88 fL (ref 80.0–100.0)
MPV: 12.2 fL (ref 7.5–12.5)
Monocytes Relative: 12.2 %
Neutro Abs: 1613 cells/uL (ref 1500–7800)
Neutrophils Relative %: 37.5 %
Platelets: 224 10*3/uL (ref 140–400)
RBC: 5.35 10*6/uL (ref 4.20–5.80)
RDW: 13.5 % (ref 11.0–15.0)
Total Lymphocyte: 40.7 %
WBC: 4.3 10*3/uL (ref 3.8–10.8)

## 2018-11-21 LAB — COMPLETE METABOLIC PANEL WITH GFR
AG Ratio: 1.6 (calc) (ref 1.0–2.5)
ALT: 25 U/L (ref 9–46)
AST: 25 U/L (ref 10–40)
Albumin: 4.9 g/dL (ref 3.6–5.1)
Alkaline phosphatase (APISO): 72 U/L (ref 36–130)
BUN: 7 mg/dL (ref 7–25)
CO2: 27 mmol/L (ref 20–32)
Calcium: 9.5 mg/dL (ref 8.6–10.3)
Chloride: 103 mmol/L (ref 98–110)
Creat: 0.93 mg/dL (ref 0.60–1.35)
GFR, Est African American: 136 mL/min/{1.73_m2} (ref >=60)
GFR, Est Non African American: 117 mL/min/{1.73_m2} (ref >=60)
Globulin: 3 g/dL (calc) (ref 1.9–3.7)
Glucose, Bld: 70 mg/dL (ref 65–99)
Potassium: 3.6 mmol/L (ref 3.5–5.3)
Sodium: 139 mmol/L (ref 135–146)
Total Bilirubin: 0.6 mg/dL (ref 0.2–1.2)
Total Protein: 7.9 g/dL (ref 6.1–8.1)

## 2018-11-21 LAB — LIPID PANEL
Cholesterol: 205 mg/dL — ABNORMAL HIGH (ref <200)
HDL: 43 mg/dL (ref >=40)
LDL Cholesterol (Calc): 140 mg/dL (calc) — ABNORMAL HIGH
Non-HDL Cholesterol (Calc): 162 mg/dL (calc) — ABNORMAL HIGH (ref <130)
Total CHOL/HDL Ratio: 4.8 (calc) (ref <5.0)
Triglycerides: 102 mg/dL (ref <150)

## 2018-11-21 LAB — HIV-1 RNA QUANT-NO REFLEX-BLD
HIV 1 RNA Quant: 1580 copies/mL — ABNORMAL HIGH
HIV-1 RNA Quant, Log: 3.2 Log copies/mL — ABNORMAL HIGH

## 2018-11-21 LAB — RPR: RPR Ser Ql: NONREACTIVE

## 2018-11-23 ENCOUNTER — Other Ambulatory Visit: Payer: Self-pay | Admitting: Infectious Disease

## 2018-11-23 DIAGNOSIS — B20 Human immunodeficiency virus [HIV] disease: Secondary | ICD-10-CM

## 2018-11-30 ENCOUNTER — Encounter: Payer: Self-pay | Admitting: Infectious Disease

## 2018-11-30 ENCOUNTER — Ambulatory Visit (INDEPENDENT_AMBULATORY_CARE_PROVIDER_SITE_OTHER): Payer: 59 | Admitting: Infectious Disease

## 2018-11-30 ENCOUNTER — Other Ambulatory Visit: Payer: Self-pay

## 2018-11-30 VITALS — Ht 72.0 in | Wt 204.0 lb

## 2018-11-30 DIAGNOSIS — F819 Developmental disorder of scholastic skills, unspecified: Secondary | ICD-10-CM | POA: Diagnosis not present

## 2018-11-30 DIAGNOSIS — B20 Human immunodeficiency virus [HIV] disease: Secondary | ICD-10-CM | POA: Diagnosis not present

## 2018-11-30 DIAGNOSIS — Z9889 Other specified postprocedural states: Secondary | ICD-10-CM

## 2018-11-30 DIAGNOSIS — A564 Chlamydial infection of pharynx: Secondary | ICD-10-CM

## 2018-11-30 DIAGNOSIS — R000 Tachycardia, unspecified: Secondary | ICD-10-CM

## 2018-11-30 MED ORDER — DARUN-COBIC-EMTRICIT-TENOFAF 800-150-200-10 MG PO TABS: 1.0000 | ORAL_TABLET | Freq: Every day | ORAL | 5 refills | Status: DC

## 2018-11-30 NOTE — Progress Notes (Signed)
Subjective:   Chief complaint: follow-up for HIV disease on meds,    Patient ID: Micheal Leonard, male    DOB: 1995/10/22, 22 y.o.   MRN: [id number]  HPI 23 year old with hx of cognitive delay, congenital heart disease [facility] now admitted with acute HIV syndrome with cervical LA, fevers, and pharyngitis. He had workp that included HIV which was + with VL of half a million. CD4 was 180 but I suspect acutely suppressed in context of his infection as he had tested negative for HIV in the summer.   He was on [place] and Descovy and suppressed virus but had biilateral rash on arms and loose stools and then change to Genvoya after BIKTARVY was not OK with insurance.  We were worried about him having risk for VF with resistance and added Prezcobix to BIKTARVY. He did not like the symptoms he was having on this and taking 2 tablets. [facility].   He was suppressed at that time.   Since I last saw him he has shown low grade viremia  He sounds to be fairly haphazard with his adherence though enough adherence that his VL only 1k  He has had STI's as well.  He is living his with mother, and her boyfriend. Boyfriend is working away from home, Mom from home.  Micheal Leonard [facility].          Past Medical History:  Diagnosis Date  . Cardiac anomaly, congenital   . Diarrhea 01/22/2017  . EBV infection    hx/notes 10/23/2016  . Hemoptysis 10/23/2016   Micheal Leonard/notes 10/23/2016  . HIV (human immunodeficiency virus infection) (HCC)   . Hypertension   . Migraine    "a few/year now; frequency is less than in the past" (10/23/2016)  . Pharyngitis 10/23/2016   notes 10/23/2016  . Pulmonary embolism (HCC) 01/29/2016  . S/P bidirectional Glenn shunt    hx/notes 10/23/2016  . S/P Fontan procedure    hx/notes 10/23/2016  . Tricuspid atresia and stenosis, congenital     Past Surgical History:  Procedure Laterality Date  .  C[facility]; hx/notes 10/23/2016  . CARDIAC SURGERY  01/1997; 2000   [facility]     Family History  Problem Relation Age of Onset  . Hypertension Father       Social History   Socioeconomic History  . Marital status: Significant Other    Spouse name: Not on file  . Number of children: Not on file  . Years of education: Not on file  . Highest education level: Not on file  Occupational History  . Not on file  Social Needs  . Financial resource strain: Not on file  . Food insecurity:    Worry: Not on file    Inability: Not on file  . Transportation needs:    Medical: Not on file    Non-medical: Not on file  Tobacco Use  . Smoking status: Never Smoker  . Smokeless tobacco: Never Used  . Tobacco comment: declined condoms  Substance and Sexual Activity  . Alcohol use: Yes    Comment: 2 GLASSES A WEEK  . Drug use: Yes    Frequency: 2.0 times per week    Types: Marijuana  . Sexual activity: Yes  Lifestyle  . Physical activity:    Days per week: Not on file  Minutes per session: Not on file  . Stress: Not on file  Relationships  . Social connections:    Talks on phone: Not on file    Gets together: Not on file    Attends religious service: Not on file    Active member of club or organization: Not on file    Attends meetings of clubs or organizations: Not on file    Relationship status: Not on file  Other Topics Concern  . Not on file  Social History Narrative  . Not on file    No Known Allergies   Current Outpatient Medications:  .  Darunavir-Cobicisctat-Emtricitabine-Tenofovir Alafenamide (SYMTUZA) 800-150-200-10 MG TABS, Take 1 tablet by mouth daily with breakfast., Disp: 30 tablet, Rfl: 5   Review of Systems  Constitutional: Negative for chills, diaphoresis and fever.  HENT: Negative for congestion, hearing loss, sore throat and tinnitus.   Eyes: Negative for photophobia.  Respiratory: Negative  for cough, shortness of breath and wheezing.   Cardiovascular: Negative for chest pain, palpitations and leg swelling.  Gastrointestinal: Negative for abdominal pain, blood in stool, constipation, diarrhea, nausea and vomiting.  Genitourinary: Negative for dysuria, flank pain and hematuria.  Musculoskeletal: Negative for back pain and myalgias.  Skin: Negative for rash.  Neurological: Negative for dizziness, weakness and headaches.  Hematological: Does not bruise/bleed easily.  Psychiatric/Behavioral: Positive for sleep disturbance. Negative for suicidal ideas. The patient is not nervous/anxious.        Objective:   Physical Exam  Constitutional: He is oriented to person, place, and time. He appears well-developed and well-nourished. No distress.  HENT:  Head: Normocephalic and atraumatic.  Mouth/Throat: No oropharyngeal exudate.  Eyes: Conjunctivae and EOM are normal. No scleral icterus.  Neck: Normal range of motion. Neck supple.  Cardiovascular: Normal rate and regular rhythm.  Pulmonary/Chest: Effort normal. No respiratory distress. He has no wheezes.  Abdominal: He exhibits no distension.  Musculoskeletal:        General: No tenderness or edema.  Neurological: He is alert and oriented to person, place, and time. He exhibits normal muscle tone. Coordination normal.  Skin: Skin is warm and dry. No rash noted. He is not diaphoretic. No erythema. No pallor.  Psychiatric: He has a normal mood and affect. His speech is normal. Judgment and thought content normal. He is slowed.          Assessment & Plan:   HIV disease: Continue SYMTUZA and recheck VL with genotype today  History of STIs screen for gonorrhea chlamydia and oropharynx and rectum and urine  Cognitive delay: Without Mom's help his adherence could suffer. He may try to do job training program which I would support   I spent greater than 25 minutes with the patient including greater than 50% of time in face to face  counsel of the patient re need for him to improve his adherence in filling out forms for him and in coordination of his care.

## 2018-12-01 MED FILL — SYMTUZA 800-150-200-10 MG T: 800-150-200 | 30 days supply | Qty: 30 | Fill #4

## 2018-12-07 LAB — HIV-1 GENOTYPING (RTI,PI,IN INHBTR)
Date Viral Load Collected: 5052020
HIV-1 Genotype: DETECTED — AB

## 2018-12-11 ENCOUNTER — Encounter: Payer: Self-pay | Admitting: *Deleted

## 2018-12-16 ENCOUNTER — Ambulatory Visit: Payer: Self-pay | Admitting: *Deleted

## 2018-12-16 ENCOUNTER — Telehealth: Payer: Self-pay | Admitting: *Deleted

## 2018-12-16 ENCOUNTER — Ambulatory Visit (INDEPENDENT_AMBULATORY_CARE_PROVIDER_SITE_OTHER): Payer: 59 | Admitting: Infectious Disease

## 2018-12-16 ENCOUNTER — Encounter: Payer: Self-pay | Admitting: Infectious Disease

## 2018-12-16 ENCOUNTER — Other Ambulatory Visit: Payer: Self-pay

## 2018-12-16 DIAGNOSIS — Z9889 Other specified postprocedural states: Secondary | ICD-10-CM

## 2018-12-16 DIAGNOSIS — F819 Developmental disorder of scholastic skills, unspecified: Secondary | ICD-10-CM | POA: Diagnosis not present

## 2018-12-16 DIAGNOSIS — B20 Human immunodeficiency virus [HIV] disease: Secondary | ICD-10-CM

## 2018-12-16 NOTE — Telephone Encounter (Addendum)
Contacted the patient today to see how his visit went with Dr [name] and to see if I can assist in anyway.  Patient stated his visit went well but he would really like some additional condoms and lubricants. I will go by his home today to ensure he has these items.   PRIOR ORDERS ENDED ON 10/29/17 WITH REQUEST FOR NEW ORDERS MADE ON 11/04/17. A VISIT WITH THE PATIENT HAS NOT BEEN MADE WITHOUT ORDERS IN PLACE Order received initially on 09/30/17 by Dr Ulyses Jarred RN to evaluate patient for [facility]).  In home visit made on 10/01/17 and evaluation completed with consent to care signed at this time.   Frequency / Duration of CBHCN visits: Effective: 08/01/18: 79mo4, 4 PRN's for complications with disease process/progression, medication changes or concerns  At this time I would like to continue to have active orders for [facility]. My focus will be on his nonadherence to his appts and I would like to offer assistance with him keeping his appt with Dr [name] on 11/30/18.   CBHCN will assess for learning needs related to diagnosis and treatment regimen, provide education as needed, fill pill box if needed, address any barriers which may be preventing or posing a risk to medication compliance, and communicating with care team including physician and case manager. Focus of each visit with be keeping the patient in care, medications access and assistance with removing potential or current barriers to care.  Individualized Plan Of Care 01/30/19 to 04/30/19  a. Type of service(s) and care to be delivered: RN Case Management  b. Frequency and duration of service: Effective 01/30/19: 46mo4, 4 prns for complications with disease process/progression, medication changes or concerns .   Visits/Contact may be conducted telephonically or in person to best suit the patient.  c. Activity restrictions: Pt may be up as tolerated and can safely ambulate without the need for a  assistive device   d. Safety Measures: Standard Precautions/Infection Control  And Disease Management  e. Service Objectives and Goals: Service Objectives are to assist the pt with HIV medication regimen adherence and staying in care with the Infectious Disease Clinic by identifying barriers to care. RN  will address the barriers that are identified by the patient. Currently the patient states his goals would be to have a 1 pill regimen.  RN's focus will be on keeping the stated goals at the center of the plan of  care and explaining the patinet how to successful reach his goals with behavioral changes to support his goals. Patient struggles with adherence when he feels unsupported.   f. Equipment required: No additional equipment needs at this time   g. Functional Limitations: Cognitive delay but functional  h. Rehabilitation potential: Guarded   i. Diet and Nutritional Needs: Regular Diet   j. Medications and treatments: Medications have been reconciled and reviewed and are a part of EPIC electronic file   k. Specific therapies if needed: RN   l. Pertinent diagnoses: New HIV disease   m. Expected outcome: Guarded

## 2018-12-16 NOTE — Progress Notes (Signed)
Virtual Visit via Telephone Note  I connected with Micheal Leonard on 12/16/18 at  3:30 PM EDT by telephone and verified that I am speaking with the correct person using two identifiers.  Location: Patient: Home Provider: [facility]   I discussed the limitations, risks, security and privacy concerns of performing an evaluation and management service by telephone and the availability of in person appointments. I also discussed with the patient that there may be a patient responsible charge related to this service. The patient expressed understanding and agreed to proceed.   History of Present Illness:   Micheal Leonard is a 23 year old African-American man who was diagnosed with HIV as a teenager.  He has had on and off again adherence and now has been off of his medications yet again.  He was viremic but did not have evidence of resistance when checked at last visit with me.  Rather than come to clinic today he elected for a phone visit.  I informed her that he really needed to have a visit in person with me.  I also told him that he may need to get labs again.  When I talked him about his medications he claims he is only missed 3 doses since he saw me.  However later in the day after our visit was finished [name] visited him and found that he had a diffuse rash and upon further questioning admitted he had not been taking any medications for several months but that he did not want me to get upset with him.  He also apparently was interested in traveling to [place] where he thought there was a cure available.  I do wonder if he might be a candidate for our ACT G study for poorly adherent patients given that he has no history of resistance and at times he has had sufficient adherence.  He also might like the idea of long-acting drugs that are injectables  Observations/Objective:  HIV disease with poor adherence  Cognitive impairment: I do not note degrees which is been formally evaluated but there is  clearly is some level of impairment.  Assessment and Plan:  HIV disease: Try to encourage him to take his Symtuza he will come here next Thursday.  I think he might be a good candidate as I mentioned for our ACT G study and according to [name] he would likely be interested     Follow Up Instructions:    I discussed the assessment and treatment plan with the patient. The patient was provided an opportunity to ask questions and all were answered. The patient agreed with the plan and demonstrated an understanding of the instructions.   The patient was advised to call back or seek an in-person evaluation if the symptoms worsen or if the condition fails to improve as anticipated.  I provided 8 minutes of non-face-to-face time during this encounter.   Acey Lav, MD

## 2018-12-23 ENCOUNTER — Telehealth: Payer: Self-pay | Admitting: Infectious Disease

## 2018-12-23 NOTE — Telephone Encounter (Signed)
COVID-19 Pre-Screening Questions:12/23/18   Do you currently have a fever (>100 F), chills or unexplained body aches? NO  Are you currently experiencing new cough, shortness of breath, sore throat, runny nose? NO   Have you recently travelled outside the state of [place] in the last 14 days? NO   Have you been in contact with someone that is currently pending confirmation of Covid19 testing or has been confirmed to have the [place] virus?  NO   **If the patient answers NO to ALL questions -  advise the patient to please call the clinic before coming to the office should any symptoms develop.

## 2018-12-24 ENCOUNTER — Ambulatory Visit (INDEPENDENT_AMBULATORY_CARE_PROVIDER_SITE_OTHER): Payer: 59 | Admitting: Infectious Disease

## 2018-12-24 ENCOUNTER — Other Ambulatory Visit (HOSPITAL_COMMUNITY)
Admission: RE | Admit: 2018-12-24 | Discharge: 2018-12-24 | Disposition: A | Discharge status: Home / Self Care | Payer: 59 | Source: Ambulatory Visit | Attending: Infectious Disease | Admitting: Infectious Disease

## 2018-12-24 ENCOUNTER — Other Ambulatory Visit: Payer: Self-pay

## 2018-12-24 ENCOUNTER — Encounter: Payer: Self-pay | Admitting: Infectious Disease

## 2018-12-24 VITALS — BP 144/89 | HR 84 | Temp 98.3°F | Wt 209.0 lb

## 2018-12-24 DIAGNOSIS — F819 Developmental disorder of scholastic skills, unspecified: Secondary | ICD-10-CM

## 2018-12-24 DIAGNOSIS — B20 Human immunodeficiency virus [HIV] disease: Secondary | ICD-10-CM | POA: Diagnosis not present

## 2018-12-24 DIAGNOSIS — A564 Chlamydial infection of pharynx: Secondary | ICD-10-CM | POA: Diagnosis not present

## 2018-12-24 NOTE — Progress Notes (Signed)
Subjective:   Chief complaint: follow-up for HIV disease on meds,    Patient ID: Micheal Leonard, male    DOB: [birth year], 23 y.o.   MRN: [id number]  HPI 23 year old with hx of cognitive delay, congenital heart disease sp [facility] now admitted with acute HIV syndrome with cervical LA, fevers, and pharyngitis. He had workp that included HIV which was + with VL of half a million. CD4 was 180 but I suspect acutely suppressed in context of his infection as he had tested negative for HIV in the summer.   He was on [place] and Descovy and suppressed virus but had biilateral rash on arms and loose stools and then change to Genvoya after BIKTARVY was not OK with insurance.  We were worried about him having risk for VF with resistance and added Prezcobix to BIKTARVY. He did not like the symptoms he was having on this and taking 2 tablets. [facility] May of [facility].   He was suppressed at that time.   He has continued to show intermittent adherence to ARV  I had phone conversation w him in which he said he only missed 3 doses of meds in month but [facility] had not been taking his ARV for months.  He explained that multiple stressors made him forget his meds including to do with his family  His mother had read about a cure and I am not sure if he is talking about BMT but I explained that this was a drastic and undesirable procedure for those without need for BMT          Past Medical History:  Diagnosis Date  . Cardiac anomaly, congenital   . Diarrhea 01/22/2017  . EBV infection    hx/notes 10/23/2016  . Hemoptysis 10/23/2016   [name]/notes 10/23/2016  . HIV (human immunodeficiency virus infection) (HCC)   . Hypertension   . Migraine    "a few/year now; frequency is less than in the past" (10/23/2016)  . Pharyngitis 10/23/2016   notes 10/23/2016  . Pulmonary embolism (HCC) 01/29/2016  . S/P bidirectional Glenn shunt    hx/notes  10/23/2016  . S/P Fontan procedure    hx/notes 10/23/2016  . Tricuspid atresia and stenosis, congenital     Past Surgical History:  Procedure Laterality Date  . C[facility]AC [facility]   [facility]; hx/notes 10/23/2016  . CARDIAC SURGERY  01/1997; 2000   [facility] a [facility]     Family History  Problem Relation Age of Onset  . Hypertension Father       Social History   Socioeconomic History  . Marital status: Significant Other    Spouse name: Not on file  . Number of children: Not on file  . Years of education: Not on file  . Highest education level: Not on file  Occupational History  . Not on file  Social Needs  . Financial resource strain: Not on file  . Food insecurity    Worry: Not on file    Inability: Not on file  . Transportation needs    Medical: Not on file    Non-medical: Not on file  Tobacco Use  . Smoking status: Never Smoker  . Smokeless tobacco: Never Used  . Tobacco comment: declined condoms  Substance and Sexual Activity  . Alcohol use: Yes    Comment: 2 GLASSES A WEEK  . Drug use: Yes  Frequency: 2.0 times per week    Types: Marijuana  . Sexual activity: Yes  Lifestyle  . Physical activity    Days per week: Not on file    Minutes per session: Not on file  . Stress: Not on file  Relationships  . Social Herbalist on phone: Not on file    Gets together: Not on file    Attends religious service: Not on file    Active member of club or organization: Not on file    Attends meetings of clubs or organizations: Not on file    Relationship status: Not on file  Other Topics Concern  . Not on file  Social History Narrative  . Not on file    No Known Allergies   Current Outpatient Medications:  .  Darunavir-Cobicisctat-Emtricitabine-Tenofovir Alafenamide (SYMTUZA) 800-150-200-10 MG TABS, Take 1 tablet by mouth daily with breakfast., Disp: 30 tablet, Rfl: 5   Review of Systems  Constitutional: Negative for  chills, diaphoresis and fever.  HENT: Negative for congestion, hearing loss, sore throat and tinnitus.   Eyes: Negative for photophobia.  Respiratory: Negative for cough, shortness of breath and wheezing.   Cardiovascular: Negative for chest pain, palpitations and leg swelling.  Gastrointestinal: Negative for abdominal pain, blood in stool, constipation, diarrhea, nausea and vomiting.  Genitourinary: Negative for dysuria, flank pain and hematuria.  Musculoskeletal: Negative for back pain and myalgias.  Skin: Negative for rash.  Neurological: Negative for dizziness, weakness and headaches.  Hematological: Does not bruise/bleed easily.  Psychiatric/Behavioral: Positive for dysphoric mood. Negative for sleep disturbance and suicidal ideas. The patient is not nervous/anxious.        Objective:   Physical Exam  Constitutional: He is oriented to person, place, and time. He appears well-developed and well-nourished. No distress.  HENT:  Head: Normocephalic and atraumatic.  Mouth/Throat: No oropharyngeal exudate.  Eyes: Conjunctivae and EOM are normal. No scleral icterus.  Neck: Normal range of motion. Neck supple.  Cardiovascular: Normal rate and regular rhythm.  Pulmonary/Chest: Effort normal. No respiratory distress. He has no wheezes.  Abdominal: He exhibits no distension.  Musculoskeletal:        General: No tenderness or edema.  Neurological: He is alert and oriented to person, place, and time. He exhibits normal muscle tone. Coordination normal.  Skin: Skin is warm and dry. No rash noted. He is not diaphoretic. No erythema. No pallor.  Psychiatric: He has a normal mood and affect. His speech is normal. Judgment and thought content normal. He is slowed.          Assessment & Plan:   HIV disease: Continue SYMTUZA and recheck VL with genotype today. Would like to enroll him into ACTG study if it opens back up again  History of STIs screen for gonorrhea chlamydia and oropharynx  and rectum and urine  Cognitive delay: Without Mom's help his adherence could suffer. He may try to do job training program which I would support   I spent greater than 25 minutes with the patient including greater than 50% of time in face to face counsel of the patient re the importance of taking his meds and restoring and preserving immune function and not transmitting the virus to others and discussion the nature and state of "cure" research and in coordination of his care.

## 2018-12-25 LAB — CYTOLOGY, (ORAL, ANAL, URETHRAL) ANCILLARY ONLY
Chlamydia: NEGATIVE
Chlamydia: NEGATIVE
Neisseria Gonorrhea: NEGATIVE
Neisseria Gonorrhea: NEGATIVE

## 2018-12-25 LAB — URINE CYTOLOGY ANCILLARY ONLY
Chlamydia: NEGATIVE
Neisseria Gonorrhea: NEGATIVE

## 2018-12-28 MED FILL — SYMTUZA 800-150-200-10 MG T: 800-150-200 | 30 days supply | Qty: 30 | Fill #5

## 2019-01-06 LAB — HIV-1 RNA ULTRAQUANT REFLEX TO GENTYP+
HIV 1 RNA Quant: 2190 copies/mL — ABNORMAL HIGH
HIV-1 RNA Quant, Log: 3.34 Log copies/mL — ABNORMAL HIGH

## 2019-01-06 LAB — HIV-1 GENOTYPE: HIV-1 Genotype: DETECTED — AB

## 2019-01-11 LAB — HIV-1 RNA ULTRAQUANT REFLEX TO GENTYP+

## 2019-01-11 LAB — HIV-1 GENOTYPE: HIV-1 Genotype: DETECTED — AB

## 2019-01-11 NOTE — Progress Notes (Signed)
Contacted the patient today to see how his visit went with Dr Tommy Medal and to see if I can assist in anyway.  Patient stated his visit went well but he would really like some additional condoms and lubricants. Traveled to the clinic and made a lube/condom bag for Micheal Leonard. Delivered the bag to his home today to ensure he has these items and Micheal Leonard stated he is concerned about seeing Dr Tommy Medal. Based on a physical assessment of Micheal Leonard he appears to have a viremic rash.  I questioned Micheal Leonard about his adherence and he admitted to not taking his medications for the last 2 or more months. Micheal Leonard stated he is afraid Dr Tommy Medal will be upset with him. Reassured Micheal Leonard that its not about letting us down or upsetting the doctor. As a team we just want to understand what he needs and how to best help him. Micheal Leonard states he would like to go to [place] and receive experimental medications that cure HIV. Explained to Micheal Leonard that Dr Tommy Medal would be a great person to discuss the lastest HIV treatment/research. Visit made with Dr Tommy Medal for the 11th and transportation has been scheduled for the patient. At this time a mask has been given to the patient as well.

## 2019-01-21 ENCOUNTER — Other Ambulatory Visit: Payer: Self-pay

## 2019-01-21 ENCOUNTER — Emergency Department (HOSPITAL_COMMUNITY)
Admission: EM | Admit: 2019-01-21 | Discharge: 2019-01-22 | Disposition: A | Discharge status: Home / Self Care | Payer: 59 | Attending: Emergency Medicine | Admitting: Emergency Medicine

## 2019-01-21 ENCOUNTER — Encounter (HOSPITAL_COMMUNITY): Payer: Self-pay

## 2019-01-21 DIAGNOSIS — I10 Essential (primary) hypertension: Secondary | ICD-10-CM | POA: Insufficient documentation

## 2019-01-21 DIAGNOSIS — R197 Diarrhea, unspecified: Secondary | ICD-10-CM | POA: Diagnosis not present

## 2019-01-21 DIAGNOSIS — Z03818 Encounter for observation for suspected exposure to other biological agents ruled out: Secondary | ICD-10-CM | POA: Diagnosis not present

## 2019-01-21 DIAGNOSIS — E860 Dehydration: Secondary | ICD-10-CM | POA: Insufficient documentation

## 2019-01-21 DIAGNOSIS — R51 Headache: Secondary | ICD-10-CM | POA: Diagnosis present

## 2019-01-21 LAB — COMPREHENSIVE METABOLIC PANEL
ALT: 28 U/L (ref 0–44)
AST: 41 U/L (ref 15–41)
Albumin: 4.8 g/dL (ref 3.5–5.0)
Alkaline Phosphatase: 86 U/L (ref 38–126)
Anion gap: 12 (ref 5–15)
BUN: 18 mg/dL (ref 6–20)
CO2: 21 mmol/L — ABNORMAL LOW (ref 22–32)
Calcium: 9.9 mg/dL (ref 8.9–10.3)
Chloride: 103 mmol/L (ref 98–111)
Creatinine, Ser: 1.41 mg/dL — ABNORMAL HIGH (ref 0.61–1.24)
GFR calc Af Amer: >60 mL/min (ref >60)
GFR calc non Af Amer: >60 mL/min (ref >60)
Glucose, Bld: 117 mg/dL — ABNORMAL HIGH (ref 70–99)
Potassium: 3.6 mmol/L (ref 3.5–5.1)
Sodium: 136 mmol/L (ref 135–145)
Total Bilirubin: 1 mg/dL (ref 0.3–1.2)
Total Protein: 9.7 g/dL — ABNORMAL HIGH (ref 6.5–8.1)

## 2019-01-21 LAB — LIPASE, BLOOD: Lipase: 22 U/L (ref 11–51)

## 2019-01-21 LAB — CBC
HCT: 54.2 % — ABNORMAL HIGH (ref 39.0–52.0)
Hemoglobin: 18.3 g/dL — ABNORMAL HIGH (ref 13.0–17.0)
MCH: 30.1 pg (ref 26.0–34.0)
MCHC: 33.8 g/dL (ref 30.0–36.0)
MCV: 89.1 fL (ref 80.0–100.0)
Platelets: 340 10*3/uL (ref 150–400)
RBC: 6.08 MIL/uL — ABNORMAL HIGH (ref 4.22–5.81)
RDW: 13.2 % (ref 11.5–15.5)
WBC: 6.8 10*3/uL (ref 4.0–10.5)
nRBC: 0 % (ref 0.0–0.2)

## 2019-01-21 MED ORDER — SODIUM CHLORIDE 0.9% FLUSH: 3.0000 mL | Freq: Once | INTRAVENOUS | Status: DC

## 2019-01-21 NOTE — ED Triage Notes (Signed)
Pt reports he has had ongoing diarrhea, headache, and vomiting. Pt reports started 3 days ago.

## 2019-01-22 LAB — URINALYSIS, ROUTINE W REFLEX MICROSCOPIC
Bilirubin Urine: NEGATIVE
Glucose, UA: NEGATIVE mg/dL
Hgb urine dipstick: NEGATIVE
Ketones, ur: NEGATIVE mg/dL
Leukocytes,Ua: NEGATIVE
Nitrite: NEGATIVE
Protein, ur: 100 mg/dL — AB
Specific Gravity, Urine: 1.033 — ABNORMAL HIGH (ref 1.005–1.030)
pH: 5 (ref 5.0–8.0)

## 2019-01-22 MED ORDER — DIPHENHYDRAMINE HCL 50 MG/ML IJ SOLN
12.5000 mg | Freq: Once | INTRAMUSCULAR | Status: AC
  Administered 2019-01-22: 12.5 mg via INTRAVENOUS
  Filled 2019-01-22: qty 1

## 2019-01-22 MED ORDER — METOCLOPRAMIDE HCL 10 MG PO TABS: 10.0000 mg | ORAL_TABLET | Freq: Four times a day (QID) | ORAL | 0 refills | Status: DC

## 2019-01-22 MED ORDER — METOCLOPRAMIDE HCL 5 MG/ML IJ SOLN
10.0000 mg | Freq: Once | INTRAMUSCULAR | Status: AC
  Administered 2019-01-22: 10 mg via INTRAVENOUS
  Filled 2019-01-22: qty 2

## 2019-01-22 MED ORDER — SODIUM CHLORIDE 0.9 % IV BOLUS
2000.0000 mL | Freq: Once | INTRAVENOUS | Status: AC
  Administered 2019-01-22: 2000 mL via INTRAVENOUS

## 2019-01-22 MED FILL — METOCLOPRAMIDE 10 MG TABLET: 10 | 3 days supply | Qty: 10 | Fill #0

## 2019-01-22 NOTE — ED Notes (Signed)
Patient verbalizes understanding of discharge instructions. Opportunity for questioning and answers were provided. Armband removed by staff, pt discharged from ED ambulatory.   

## 2019-01-22 NOTE — Discharge Instructions (Signed)
Please follow up with your doctor for recheck in 3 days if symptoms persist.

## 2019-01-22 NOTE — ED Notes (Signed)
Pt tolerating PO Apple Juice.

## 2019-01-22 NOTE — ED Provider Notes (Signed)
[facility] EMERGENCY DEPARTMENT Provider Note   CSN: [id number] Arrival date & time: 01/21/19  2127     History   Chief Complaint Chief Complaint  Patient presents with  . Headache  . Diarrhea    HPI Micheal Leonard is a 23 y.o. male.     Patient with history of HIV, s/p bidirectional Glenn shunt, s/p Fontan procedure, presents with diarrhea x 3 days, TNTC episodes that are non-bloody. He has had infrequent vomiting but persistent nausea. No fever, cough, congestion, chest pain. He reports diffuse abdominal pain. No dysuria but he reports urinating less frequently. The patient has a headache that is frontal and bilateral that started at the same time as his diarrhea. No recent changes in medications or new medications.   The history is provided by the patient. No language interpreter was used.    Past Medical History:  Diagnosis Date  . Cardiac anomaly, congenital   . Diarrhea 01/22/2017  . EBV infection    hx/notes 10/23/2016  . Hemoptysis 10/23/2016   [name] 10/23/2016  . HIV (human immunodeficiency virus infection) ([place])   . Hypertension   . Migraine    "a few/year now; frequency is less than in the past" (10/23/2016)  . Pharyngitis 10/23/2016   notes 10/23/2016  . Pulmonary embolism ([place]) 01/29/2016  . S/P bidirectional Glenn shunt    hx/notes 10/23/2016  . S/P Fontan procedure    hx/notes 10/23/2016  . Tricuspid atresia and stenosis, congenital     Patient Active Problem List   Diagnosis Date Noted  . Diarrhea 01/22/2017  . Bleeding gums 11/02/2016  . Sinus tachycardia 11/02/2016  . Cognitive developmental delay 10/25/2016  . Pulmonary embolism ([place]) 10/24/2016  . HIV disease ([place]) 10/24/2016  . Chlamydial infection of pharynx 10/23/2016  . S/P Fontan procedure 10/23/2016  . S/P bidirectional Glenn shunt 10/23/2016  . Tricuspid atresia with normal great arteries 10/23/2016  . Proteinuria 10/23/2016    Past Surgical History:  Procedure  Laterality Date  . CARDIAC CATHETERIZATION  01/2016   at [facility]; hx/notes 10/23/2016  . CARDIAC SURGERY  01/1997; 2000   [facility] as a child         Home Medications    Prior to Admission medications   Medication Sig Start Date End Date Taking? Authorizing Provider  Darunavir-Cobicisctat-Emtricitabine-Tenofovir Alafenamide [facility]) 800-150-200-10 MG TABS Take 1 tablet by mouth daily with breakfast. 11/30/18   Tommy Medal, Lavell Islam, MD    Family History Family History  Problem Relation Age of Onset  . Hypertension Father     Social History Social History   Tobacco Use  . Smoking status: Never Smoker  . Smokeless tobacco: Never Used  . Tobacco comment: declined condoms  Substance Use Topics  . Alcohol use: Yes    Comment: 2 GLASSES A WEEK  . Drug use: Yes    Frequency: 2.0 times per week    Types: Marijuana     Allergies   Patient has no known allergies.   Review of Systems Review of Systems  Constitutional: Negative for chills and fever.  HENT: Negative.   Respiratory: Negative.   Cardiovascular: Negative.   Gastrointestinal: Positive for abdominal pain and diarrhea. Negative for blood in stool.  Genitourinary: Positive for decreased urine volume. Negative for dysuria.  Musculoskeletal: Negative.   Skin: Negative.   Neurological: Positive for headaches.     Physical Exam Updated Vital Signs BP 120/71 (BP Location: Right Arm)   Pulse (!) 59  Temp 98.1 F (36.7 C) (Oral)   Resp 18   SpO2 98%   Physical Exam Constitutional:      Appearance: He is well-developed.  HENT:     Head: Normocephalic.     Mouth/Throat:     Mouth: Mucous membranes are dry.  Neck:     Musculoskeletal: Normal range of motion and neck supple.  Cardiovascular:     Rate and Rhythm: Normal rate and regular rhythm.  Pulmonary:     Effort: Pulmonary effort is normal.     Breath sounds: Normal breath sounds. No wheezing, rhonchi or rales.  Abdominal:      General: Bowel sounds are normal. There is no distension.     Palpations: Abdomen is soft.     Tenderness: There is abdominal tenderness (Tenderness is diffuse. ). There is no guarding or rebound.  Musculoskeletal: Normal range of motion.  Skin:    General: Skin is warm and dry.     Findings: No rash.  Neurological:     General: No focal deficit present.     Mental Status: He is alert and oriented to person, place, and time.      ED Treatments / Results  Labs (all labs ordered are listed, but only abnormal results are displayed) Labs Reviewed  COMPREHENSIVE METABOLIC PANEL - Abnormal; Notable for the following components:      Result Value   CO2 21 (*)    Glucose, Bld 117 (*)    Creatinine, Ser 1.41 (*)    Total Protein 9.7 (*)    All other components within normal limits  CBC - Abnormal; Notable for the following components:   RBC 6.08 (*)    Hemoglobin 18.3 (*)    HCT 54.2 (*)    All other components within normal limits  URINALYSIS, ROUTINE W REFLEX MICROSCOPIC - Abnormal; Notable for the following components:   Color, Urine AMBER (*)    Specific Gravity, Urine 1.033 (*)    Protein, ur 100 (*)    Bacteria, UA RARE (*)    All other components within normal limits  LIPASE, BLOOD    EKG None  Radiology No results found.  Procedures Procedures (including critical care time)  Medications Ordered in ED Medications  sodium chloride flush (NS) 0.9 % injection 3 mL (has no administration in time range)  sodium chloride 0.9 % bolus 2,000 mL (has no administration in time range)  metoCLOPramide (REGLAN) injection 10 mg (has no administration in time range)  diphenhydrAMINE (BENADRYL) injection 12.5 mg (has no administration in time range)     Initial Impression / Assessment and Plan / ED Course  I have reviewed the triage vital signs and the nursing notes.  Pertinent labs & imaging results that were available during my care of the patient were reviewed by me and  considered in my medical decision making (see chart for details).        Patient to ED with diffuse abdominal pain, diarrhea, nausea with infrequent vomiting and headache that is bilateral and frontal.   The patient denies fever and is afebrile in ED. VSS, no tachycardia or hypotension.   No episodes of diarrhea since arrival here. He is given IV fluids for dehydration, Reglan and benadryl for nausea and for headache.   On re-examination, the patient is feeling better. Headache is resolved, no further nausea. He is tolerating PO fluids.   Chart reviewed. The patient has a significant heart history but denies chest pain, SOB tonight and is  not felt contributory to his current illness.   He can be discharged home. He is advised to see his PCP for recheck this coming week if symptoms continue. Will provide Reglan for nausea at home.   Final Clinical Impressions(s) / ED Diagnoses   Final diagnoses:  None   1. Diarrhea 2. Dehydration  ED Discharge Orders    None       Elpidio AnisUpstill, Kileigh Ortmann, Cordelia Poche-C 01/22/19 [id number]    Palumbo, April, MD 01/22/19 [phone]

## 2019-01-23 LAB — NOVEL CORONAVIRUS, NAA (HOSP ORDER, SEND-OUT TO REF LAB; TAT 18-24 HRS): SARS-CoV-2, NAA: NOT DETECTED

## 2019-01-25 MED FILL — SYMTUZA 800-150-200-10 MG T: 800-150-200 | 30 days supply | Qty: 30 | Fill #0

## 2019-02-08 NOTE — Telephone Encounter (Signed)
I acknowledge and approve of this care plan

## 2019-02-12 ENCOUNTER — Encounter: Payer: Self-pay | Admitting: *Deleted

## 2019-02-16 MED FILL — SYMTUZA 800-150-200-10 MG T: 800-150-200 | 30 days supply | Qty: 30 | Fill #1

## 2019-03-11 ENCOUNTER — Telehealth: Payer: Self-pay | Admitting: *Deleted

## 2019-03-11 NOTE — Telephone Encounter (Signed)
RN contacted the patient today. Purpose of the call is to stay connected with the patient. RN left a message stating that I wanted to be sure all is well and to please let me know if I can assist in any way.  

## 2019-03-18 MED FILL — SYMTUZA 800-150-200-10 MG T: 800-150-200 | 30 days supply | Qty: 30 | Fill #2

## 2019-04-13 MED FILL — SYMTUZA 800-150-200-10 MG T: 800-150-200 | 30 days supply | Qty: 30 | Fill #3

## 2019-04-14 ENCOUNTER — Encounter: Payer: Self-pay | Admitting: *Deleted

## 2019-04-27 ENCOUNTER — Ambulatory Visit: Payer: Self-pay | Admitting: *Deleted

## 2019-04-27 ENCOUNTER — Telehealth: Payer: Self-pay | Admitting: *Deleted

## 2019-04-27 DIAGNOSIS — F4323 Adjustment disorder with mixed anxiety and depressed mood: Secondary | ICD-10-CM

## 2019-04-27 DIAGNOSIS — B20 Human immunodeficiency virus [HIV] disease: Secondary | ICD-10-CM

## 2019-04-27 NOTE — Telephone Encounter (Signed)
Call placed to [facility]. I did not receive an answer so a message was left asking him to return my call to discuss his current needs and the need to schedule an appointment

## 2019-05-10 MED FILL — SYMTUZA 800-150-200-10 MG T: 800-150-200 | 30 days supply | Qty: 30 | Fill #4

## 2019-05-12 NOTE — Telephone Encounter (Signed)
  Individualized Plan Of Care 05/01/19 to 07/30/19  a. Type of service(s) and care to be delivered: RN Case Management  b. Frequency and duration of service: Effective 05/01/19: 68mo4, 4 prns for complications with disease process/progression, medication changes or concerns .   Visits/Contact may be conducted telephonically or in person to best suit the patient.  c. Activity restrictions: Pt may be up as tolerated and can safely ambulate without the need for a assistive device   d. Safety Measures: Standard Precautions/Infection Control  And Disease Management  e. Service Objectives and Goals: Service Objectives are to assist the pt with HIV medication regimen adherence and staying in care with the Infectious Disease Clinic by identifying barriers to care. RN  will address the barriers that are identified by the patient. Currently the patient states his goals would be to have a 1 pill regimen.  RN's focus will be on keeping the stated goals at the center of the plan of  care and explaining the patinet how to successful reach his goals with behavioral changes to support his goals. Patient struggles with adherence when he feels unsupported.   f. Equipment required: No additional equipment needs at this time   g. Functional Limitations: Cognitive delay but functional  h. Rehabilitation potential: Guarded   i. Diet and Nutritional Needs: Regular Diet   j. Medications and treatments: Medications have been reconciled and reviewed and are a part of EPIC electronic file   k. Specific therapies if needed: RN   l. Pertinent diagnoses: New HIV disease   m. Expected outcome: Guarded

## 2019-05-13 NOTE — Telephone Encounter (Signed)
I agree and I agree with your plan as outlined.

## 2019-05-31 ENCOUNTER — Ambulatory Visit: Payer: 59 | Admitting: Infectious Disease

## 2019-05-31 NOTE — Progress Notes (Signed)
Purpose of this communication is to relay that that the set frequency for home visits this week has been changed due to a missed visit. Communication has been made with the patient to ensure needs have been met and to offer a home visit. At this time a return call has not been received before the business week is out. The [facility] Nurse plans to continue contact with the patient to offer any services or attempt to address any needs the patient expresses that is reasonable. Dr has been contacted and made aware of this change in the plan of care and how the patient's needs have been met  

## 2019-05-31 NOTE — Progress Notes (Signed)
Purpose of this communication is to relay that that the set frequency for home visits this week has been changed due to a missed visit. Communication has been made with the patient to ensure needs have been met and to offer a home visit. At this time a return call has not been received before the business week is out. The [facility] Nurse plans to continue contact with the patient to offer any services or attempt to address any needs the patient expresses that is reasonable. Dr has been contacted and made aware of this change in the plan of care and how the patient's needs have been met 

## 2019-06-04 ENCOUNTER — Emergency Department (HOSPITAL_COMMUNITY)
Admission: EM | Admit: 2019-06-04 | Discharge: 2019-06-04 | Disposition: A | Discharge status: Home / Self Care | Payer: No Typology Code available for payment source | Attending: Emergency Medicine | Admitting: Emergency Medicine

## 2019-06-04 ENCOUNTER — Other Ambulatory Visit: Payer: Self-pay

## 2019-06-04 ENCOUNTER — Encounter (HOSPITAL_COMMUNITY): Payer: Self-pay | Admitting: Emergency Medicine

## 2019-06-04 DIAGNOSIS — Z20828 Contact with and (suspected) exposure to other viral communicable diseases: Secondary | ICD-10-CM | POA: Insufficient documentation

## 2019-06-04 DIAGNOSIS — I889 Nonspecific lymphadenitis, unspecified: Secondary | ICD-10-CM | POA: Insufficient documentation

## 2019-06-04 DIAGNOSIS — Z21 Asymptomatic human immunodeficiency virus [HIV] infection status: Secondary | ICD-10-CM | POA: Insufficient documentation

## 2019-06-04 DIAGNOSIS — I10 Essential (primary) hypertension: Secondary | ICD-10-CM | POA: Insufficient documentation

## 2019-06-04 DIAGNOSIS — M542 Cervicalgia: Secondary | ICD-10-CM | POA: Diagnosis present

## 2019-06-04 DIAGNOSIS — Z79899 Other long term (current) drug therapy: Secondary | ICD-10-CM | POA: Insufficient documentation

## 2019-06-04 LAB — GROUP A STREP BY PCR: Group A Strep by PCR: NOT DETECTED

## 2019-06-04 MED ORDER — IBUPROFEN 600 MG PO TABS: 600.0000 mg | ORAL_TABLET | Freq: Four times a day (QID) | ORAL | 0 refills | Status: DC | PRN

## 2019-06-04 MED FILL — SYMTUZA 800-150-200-10 MG T: 800-150-200 | 30 days supply | Qty: 30 | Fill #5

## 2019-06-04 NOTE — ED Provider Notes (Signed)
[facility] EMERGENCY DEPARTMENT Provider Note   CSN: [id number] Arrival date & time: 06/04/19  1954     History   Chief Complaint Chief Complaint  Patient presents with  . Neck Pain    HPI Micheal Leonard is a 23 y.o. male.     The history is provided by the patient and medical records. No language interpreter was used.  Neck Pain    23 year old male with history of HIV, hypertension, PE presenting complaining of neck pain.  Patient report pain to the back of his neck ongoing for the past 2 days.  Described pain as a throbbing achy sensation, persistent, more noticeable with palpation or with movement of his neck.  Rates pain as 7 out of 10.  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  He did not have any significant pain at that time.  At home he has been taking some aspirin without adequate relief.  He does not complain of any fever chills no runny nose sneezing or coughing no sore throat, ear pain, chest pain, trouble breathing, rash, or dysuria.  He is compliant with his HIV medication and states that it is well controlled.  No recent sick contact with anyone with COVID-19.  Past Medical History:  Diagnosis Date  . Cardiac anomaly, congenital   . Diarrhea 01/22/2017  . EBV infection    hx/notes 10/23/2016  . Hemoptysis 10/23/2016   [name]/notes 10/23/2016  . HIV (human immunodeficiency virus infection) (HCC)   . Hypertension   . Migraine    "a few/year now; frequency is less than in the past" (10/23/2016)  . Pharyngitis 10/23/2016   notes 10/23/2016  . Pulmonary embolism (HCC) 01/29/2016  . S/P bidirectional Glenn shunt    hx/notes 10/23/2016  . S/P Fontan procedure    hx/notes 10/23/2016  . Tricuspid atresia and stenosis, congenital     Patient Active Problem List   Diagnosis Date Noted  . Diarrhea 01/22/2017  . Bleeding gums  11/02/2016  . Sinus tachycardia 11/02/2016  . Cognitive developmental delay 10/25/2016  . Pulmonary embolism (HCC) 10/24/2016  . HIV disease (HCC) 10/24/2016  . Chlamydial infection of pharynx 10/23/2016  . S/P Fontan procedure 10/23/2016  . S/P bidirectional Glenn shunt 10/23/2016  . Tricuspid atresia with normal great arteries 10/23/2016  . Proteinuria 10/23/2016    Past Surgical History:  Procedure Laterality Date  . [facility]; hx/notes 10/23/2016  . CARDIAC SURGERY  01/1997; 2000   [facility]         Home Medications    Prior to Admission medications   Medication Sig Start Date End Date Taking? Authorizing Provider  Darunavir-Cobicisctat-Emtricitabine-Tenofovir Alafenamide [facility](SYMTUZA) 800-150-200-10 MG TABS Take 1 tablet by mouth daily with breakfast. 11/30/18   Daiva EvesVan Dam, Lisette Grinderornelius N, MD  metoCLOPramide (REGLAN) 10 MG tablet Take 1 tablet (10 mg total) by mouth every 6 (six) hours. 01/22/19   Elpidio AnisUpstill, Shari, PA-C    Family History Family History  Problem Relation Age of Onset  . Hypertension Father     Social History Social History   Tobacco Use  . Smoking status: Never Smoker  . Smokeless tobacco: Never Used  . Tobacco comment: declined condoms  Substance Use Topics  . Alcohol use: Yes    Comment: 2 GLASSES A WEEK  .  Drug use: Yes    Frequency: 2.0 times per week    Types: Marijuana     Allergies   Patient has no known allergies.   Review of Systems Review of Systems  Musculoskeletal: Positive for neck pain.  All other systems reviewed and are negative.    Physical Exam Updated Vital Signs BP (!) 144/90 (BP Location: Left Arm)   Pulse (!) 107   Temp 99.5 F (37.5 C) (Oral)   Resp 16   Ht 6\' 1"  (1.854 m)   Wt 79.4 kg   SpO2 95%   BMI 23.09 kg/m   Physical Exam Vitals signs and nursing note reviewed.  Constitutional:      General: He is not in acute distress.    Appearance: He is  well-developed.  HENT:     Head: Atraumatic.     Right Ear: Tympanic membrane normal.     Left Ear: Tympanic membrane normal.     Nose: Nose normal.     Mouth/Throat:     Mouth: Mucous membranes are moist.  Eyes:     Conjunctiva/sclera: Conjunctivae normal.  Neck:     Musculoskeletal: Normal range of motion and neck supple. No neck rigidity.     Comments: Shotty reactive occipital lymph node palpated, tenderness to palpation. Cardiovascular:     Rate and Rhythm: Tachycardia present.     Pulses: Normal pulses.     Heart sounds: Normal heart sounds.  Pulmonary:     Effort: Pulmonary effort is normal.     Breath sounds: Normal breath sounds. No wheezing, rhonchi or rales.  Abdominal:     Palpations: Abdomen is soft.     Tenderness: There is no abdominal tenderness.  Skin:    Findings: No rash.  Neurological:     Mental Status: He is alert and oriented to person, place, and time.  Psychiatric:        Mood and Affect: Mood normal.      ED Treatments / Results  Labs (all labs ordered are listed, but only abnormal results are displayed) Labs Reviewed  GROUP A STREP BY PCR  SARS CORONAVIRUS 2 (TAT 6-24 HRS)    EKG None  Radiology No results found.  Procedures Procedures (including critical care time)  Medications Ordered in ED Medications - No data to display   Initial Impression / Assessment and Plan / ED Course  I have reviewed the triage vital signs and the nursing notes.  Pertinent labs & imaging results that were available during my care of the patient were reviewed by me and considered in my medical decision making (see chart for details).        BP (!) 144/90 (BP Location: Left Arm)   Pulse (!) 107   Temp 99.5 F (37.5 C) (Oral)   Resp 16   Ht 6\' 1"  (1.854 m)   Wt 79.4 kg   SpO2 95%   BMI 23.09 kg/m    Final Clinical Impressions(s) / ED Diagnoses   Final diagnoses:  Occipital lymphadenitis    ED Discharge Orders         Ordered     ibuprofen (ADVIL) 600 MG tablet  Every 6 hours PRN     06/04/19 2144         9:31 PM Patient report tenderness to his posterior neck for the past couple days.  He did report being struck by a sliding door several days prior to that but did not have any pain at that time.  On exam, he does have some shotty lymphadenopathy noted to the occiput/posterior neck without any midline cervical spine tenderness and no nuchal rigidity.  He does not have any URI symptom or Covid symptoms.  Ear nose and throat is unremarkable.  History of HIV have patient report compliant with his medication and HIV is well controlled  I suspect pain is related to his lymphadenitis.  A COVID-19 test was obtained as well as rapid strep test.  Patient discharged home with anti-inflammatory medication along with return precaution.  Kay Ricciuti was evaluated in Emergency Department on 06/04/2019 for the symptoms described in the history of present illness. He was evaluated in the context of the global COVID-19 pandemic, which necessitated consideration that the patient might be at risk for infection with the SARS-CoV-2 virus that causes COVID-19. Institutional protocols and algorithms that pertain to the evaluation of patients at risk for COVID-19 are in a state of rapid change based on information released by regulatory bodies including the CDC and federal and state organizations. These policies and algorithms were followed during the patient's care in the ED.    Domenic Moras, PA-C 06/04/19 2201    Drenda Freeze, MD 06/04/19 [phone]

## 2019-06-04 NOTE — ED Notes (Signed)
Discharge instructions discussed with pt. Pt verbalized understanding with no questions at this time.  

## 2019-06-04 NOTE — Discharge Instructions (Signed)
Your neck pain is likely due to enlarged lymph nodes.  This could be due to an underlying infection.  At this time no obvious infection were noted.  Please take ibuprofen as needed for pain.  Your strep test is negative.  Your COVID-19 test is currently pending however if test positive, you will be notified in the next 2 to 3 days.  If you develop worsening symptoms we have any concern please return to the ER for further management

## 2019-06-04 NOTE — ED Triage Notes (Addendum)
Pt reports right sided face/neck/shoulder muscle pain/soreness that started a few days ago. Pt reports he had an accident at work where a door hit him in the neck and is unsure if the pain is from that or if he pulled a muscle. Increased pain with motion. No neuro deficits. Full ROM in shoulder, limited neck movement.

## 2019-06-05 LAB — SARS CORONAVIRUS 2 (TAT 6-24 HRS): SARS Coronavirus 2: NEGATIVE

## 2019-06-05 MED FILL — IBUPROFEN 600 MG TABLET: 600 | 8 days supply | Qty: 30 | Fill #0

## 2019-06-16 ENCOUNTER — Ambulatory Visit: Payer: 59 | Admitting: Infectious Disease

## 2019-06-16 ENCOUNTER — Other Ambulatory Visit: Payer: Self-pay

## 2019-06-16 ENCOUNTER — Other Ambulatory Visit (HOSPITAL_COMMUNITY)
Admission: RE | Admit: 2019-06-16 | Discharge: 2019-06-16 | Disposition: A | Discharge status: Home / Self Care | Payer: 59 | Source: Ambulatory Visit | Attending: Infectious Disease | Admitting: Infectious Disease

## 2019-06-16 ENCOUNTER — Encounter: Payer: Self-pay | Admitting: Infectious Disease

## 2019-06-16 VITALS — BP 132/78 | HR 105 | Temp 98.0°F | Ht 73.0 in | Wt 188.0 lb

## 2019-06-16 DIAGNOSIS — B20 Human immunodeficiency virus [HIV] disease: Secondary | ICD-10-CM | POA: Diagnosis present

## 2019-06-16 DIAGNOSIS — A564 Chlamydial infection of pharynx: Secondary | ICD-10-CM | POA: Diagnosis present

## 2019-06-16 DIAGNOSIS — Q224 Congenital tricuspid stenosis: Secondary | ICD-10-CM | POA: Diagnosis not present

## 2019-06-16 DIAGNOSIS — Z23 Encounter for immunization: Secondary | ICD-10-CM | POA: Diagnosis not present

## 2019-06-16 DIAGNOSIS — Z9889 Other specified postprocedural states: Secondary | ICD-10-CM | POA: Diagnosis present

## 2019-06-16 NOTE — Progress Notes (Signed)
Chief complaint: rash on SYMTUZA and frustration at work   Subjective:   Chief complaint: follow-up for HIV disease on meds,    Patient ID: Micheal Leonard, male    DOB: 1995/09/12, 23 y.o.   MRN: [id number]  HPI 23 year old with hx of cognitive delay, congenital heart disease sp fontane procedure at [facility] originally  admitted with acute HIV syndrome in 2018 with cervical LA, fevers, and pharyngitis. He had workp that included HIV which was + with VL of half a million.   He was on [place] and Descovy and suppressed virus but had biilateral rash on arms and loose stools and then change to Genvoya after BIKTARVY was not OK with insurance.  We were worried about him having risk for VF with resistance and added Prezcobix to BIKTARVY. He did not like the symptoms he was having on this and taking 2 tablets. He was [facility] to [facility] by Ulyses SouthwardMinh Pham in May of 2019.   He was suppressed at that time.   He has continued to show intermittent adherence to ARV  He has struggled with taking daily ARV and only brought virus down to few thousand copies.  He claims to experience rash with starting [facility] and thinks it also is causing him to have blood in stools in the past.  He was  In ER with neck pain and screend for COVID 19 and strep throat.  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     Past Medical History:  Diagnosis Date  . Cardiac anomaly, congenital   . Diarrhea 01/22/2017  . EBV infection    hx/notes 10/23/2016  . Hemoptysis 10/23/2016   [name]/notes 10/23/2016  . HIV (human immunodeficiency virus infection) (HCC)   . Hypertension   . Migraine    "a few/year now; frequency is less than in the past" (10/23/2016)  . Pharyngitis 10/23/2016   notes 10/23/2016  . Pulmonary embolism (HCC) 01/29/2016  . S/P bidirectional Glenn shunt    hx/notes 10/23/2016  . S/P Fontan  procedure    hx/notes 10/23/2016  . Tricuspid atresia and stenosis, congenital     Past Surgical History:  Procedure Laterality Date  . [facility]; hx/notes 10/23/2016  . CARDIAC SURGERY  01/1997; 2000   [facility]     Family History  Problem Relation Age of Onset  . Hypertension Father       Social History   Socioeconomic History  . Marital status: Significant Other    Spouse name: Not on file  . Number of children: Not on file  . Years of education: Not on file  . Highest education level: Not on file  Occupational History  . Not on file  Social Needs  . Financial resource strain: Not on file  . Food insecurity    Worry: Not on file    Inability: Not on file  . Transportation needs    Medical: Not on file    Non-medical: Not on file  Tobacco Use  . Smoking status: Never Smoker  . Smokeless tobacco: Never Used  . Tobacco comment: declined condoms  Substance and Sexual Activity  . Alcohol use: Yes    Comment: 2 GLASSES A WEEK  . Drug use: Yes    Frequency: 2.0 times per week    Types: Marijuana  .  Sexual activity: Yes  Lifestyle  . Physical activity    Days per week: Not on file    Minutes per session: Not on file  . Stress: Not on file  Relationships  . Social Musician on phone: Not on file    Gets together: Not on file    Attends religious service: Not on file    Active member of club or organization: Not on file    Attends meetings of clubs or organizations: Not on file    Relationship status: Not on file  Other Topics Concern  . Not on file  Social History Narrative  . Not on file    No Known Allergies   Current Outpatient Medications:  .  Darunavir-Cobicisctat-Emtricitabine-Tenofovir Alafenamide (SYMTUZA) 800-150-200-10 MG TABS, Take 1 tablet by mouth daily with breakfast., Disp: 30 tablet, Rfl: 5 .  ibuprofen (ADVIL) 600 MG tablet, Take 1 tablet (600 mg total) by mouth every  6 (six) hours as needed., Disp: 30 tablet, Rfl: 0 .  metoCLOPramide (REGLAN) 10 MG tablet, Take 1 tablet (10 mg total) by mouth every 6 (six) hours., Disp: 10 tablet, Rfl: 0   Review of Systems  Constitutional: Negative for chills, diaphoresis and fever.  HENT: Negative for congestion, hearing loss, sore throat and tinnitus.   Eyes: Negative for photophobia.  Respiratory: Negative for cough, shortness of breath and wheezing.   Cardiovascular: Negative for chest pain, palpitations and leg swelling.  Gastrointestinal: Positive for blood in stool. Negative for abdominal pain, constipation, diarrhea, nausea and vomiting.  Genitourinary: Negative for dysuria, flank pain and hematuria.  Musculoskeletal: Negative for back pain and myalgias.  Skin: Positive for rash.  Neurological: Negative for dizziness, weakness and headaches.  Hematological: Does not bruise/bleed easily.  Psychiatric/Behavioral: Positive for dysphoric mood. Negative for sleep disturbance and suicidal ideas. The patient is not nervous/anxious.        Objective:   Physical Exam  Constitutional: He is oriented to person, place, and time. He appears well-developed and well-nourished. No distress.  HENT:  Head: Normocephalic and atraumatic.  Mouth/Throat: No oropharyngeal exudate.  Eyes: Conjunctivae and EOM are normal. No scleral icterus.  Neck: Normal range of motion. Neck supple.  Cardiovascular: Normal rate and regular rhythm.  Pulmonary/Chest: Effort normal. No respiratory distress. He has no wheezes.  Abdominal: He exhibits no distension.  Musculoskeletal:        General: No tenderness or edema.  Neurological: He is alert and oriented to person, place, and time. He exhibits normal muscle tone. Coordination normal.  Skin: Skin is warm and dry. No rash noted. He is not diaphoretic. No erythema. No pallor.  Psychiatric: He has a normal mood and affect. His speech is normal. Judgment and thought content normal. Cognition  and memory are not impaired. He exhibits normal recent memory and normal remote memory.   Rash on arms 06/16/2019:           Assessment & Plan:   HIV disease: check labs today and introduced him to [employer]. I think it may be worthwhile changing him back to an  INSTI  Based regimen but he didn't yet want to do so. Perhaps Tivicay and Descovy VIA study if he can be enrolled into ACTG study  History of STIs check labs  Cognitive delay: Without Mom's help his adherence has suffered. I suspect depression plays a significant role her  Rash: checking RPR. As above I am willing to change off of DRV based regimen  I spent greater than 25 minutes with the patient including greater than 50% of time in face to face counsel of the patient re HIV regimens, ACTG study and in coordination of his care

## 2019-06-17 LAB — CYTOLOGY, (ORAL, ANAL, URETHRAL) ANCILLARY ONLY
Chlamydia: NEGATIVE
Chlamydia: POSITIVE — AB
Comment: NEGATIVE
Comment: NEGATIVE
Comment: NORMAL
Comment: NORMAL
Neisseria Gonorrhea: NEGATIVE
Neisseria Gonorrhea: POSITIVE — AB

## 2019-06-17 LAB — URINE CYTOLOGY ANCILLARY ONLY
Chlamydia: NEGATIVE
Comment: NEGATIVE
Comment: NORMAL
Neisseria Gonorrhea: NEGATIVE

## 2019-06-17 LAB — T-HELPER CELL (CD4) - (RCID CLINIC ONLY)
CD4 % Helper T Cell: 24 % — ABNORMAL LOW (ref 33–65)
CD4 T Cell Abs: 192 /uL — ABNORMAL LOW (ref 400–1790)

## 2019-06-21 ENCOUNTER — Telehealth: Payer: Self-pay

## 2019-06-21 DIAGNOSIS — A64 Unspecified sexually transmitted disease: Secondary | ICD-10-CM

## 2019-06-21 MED ORDER — DOXYCYCLINE HYCLATE 100 MG PO TABS: 100.0000 mg | ORAL_TABLET | Freq: Two times a day (BID) | ORAL | 0 refills | Status: DC

## 2019-06-21 MED FILL — DOXYCYCLINE HYCLATE 100 MG: 100 | 14 days supply | Qty: 28 | Fill #0

## 2019-06-21 NOTE — Telephone Encounter (Signed)
Tommy Medal, Lavell Islam, MD  P [facility] Triage Nurse Pool    Isiaah's rash is not from [facility] it is from syphilis. He need ONE 2.4 MU IM injection of Pencillin. He also needs 250mg  of ceftriaxone IM + 1 gram of azthromycin. Then he also needs to take 14 days of doxycyline100mg  twice daily, partners need to be treated, tested and tested for HIV and offered PrEP   Patent made aware of results and treatment needed. Patient advised to make partners aware so they can also be tested and treated at the health department and encourage appointment for HIV testing/PREP. Patient understood results and advise. Patient scheduled for nurse visit 12/9 and doxycycline 100mg  twice daily for 14 days sent to [facility]

## 2019-06-23 ENCOUNTER — Ambulatory Visit: Payer: 59

## 2019-06-23 ENCOUNTER — Encounter: Payer: 59 | Admitting: *Deleted

## 2019-06-29 ENCOUNTER — Other Ambulatory Visit: Payer: Self-pay | Admitting: Infectious Disease

## 2019-06-30 LAB — CBC WITH DIFFERENTIAL/PLATELET
Absolute Monocytes: 302 cells/uL (ref 200–950)
Basophils Absolute: 22 cells/uL (ref 0–200)
Basophils Relative: 0.6 %
Eosinophils Absolute: 223 cells/uL (ref 15–500)
Eosinophils Relative: 6.2 %
HCT: 41 % (ref 38.5–50.0)
Hemoglobin: 13.6 g/dL (ref 13.2–17.1)
Lymphs Abs: 896 cells/uL (ref 850–3900)
MCH: 27.9 pg (ref 27.0–33.0)
MCHC: 33.2 g/dL (ref 32.0–36.0)
MCV: 84.2 fL (ref 80.0–100.0)
MPV: 11.2 fL (ref 7.5–12.5)
Monocytes Relative: 8.4 %
Neutro Abs: 2156 cells/uL (ref 1500–7800)
Neutrophils Relative %: 59.9 %
Platelets: 272 10*3/uL (ref 140–400)
RBC: 4.87 10*6/uL (ref 4.20–5.80)
RDW: 13.9 % (ref 11.0–15.0)
Total Lymphocyte: 24.9 %
WBC: 3.6 10*3/uL — ABNORMAL LOW (ref 3.8–10.8)

## 2019-06-30 LAB — HIV-1 GENOTYPE: HIV-1 Genotype: DETECTED — AB

## 2019-06-30 LAB — COMPLETE METABOLIC PANEL WITH GFR
AG Ratio: 0.8 (calc) — ABNORMAL LOW (ref 1.0–2.5)
ALT: 14 U/L (ref 9–46)
AST: 26 U/L (ref 10–40)
Albumin: 4 g/dL (ref 3.6–5.1)
Alkaline phosphatase (APISO): 158 U/L — ABNORMAL HIGH (ref 36–130)
BUN: 7 mg/dL (ref 7–25)
CO2: 25 mmol/L (ref 20–32)
Calcium: 9.5 mg/dL (ref 8.6–10.3)
Chloride: 104 mmol/L (ref 98–110)
Creat: 0.82 mg/dL (ref 0.60–1.35)
GFR, Est African American: 145 mL/min/{1.73_m2} (ref >=60)
GFR, Est Non African American: 126 mL/min/{1.73_m2} (ref >=60)
Globulin: 5.2 g/dL (calc) — ABNORMAL HIGH (ref 1.9–3.7)
Glucose, Bld: 79 mg/dL (ref 65–99)
Potassium: 4.3 mmol/L (ref 3.5–5.3)
Sodium: 138 mmol/L (ref 135–146)
Total Bilirubin: 0.6 mg/dL (ref 0.2–1.2)
Total Protein: 9.2 g/dL — ABNORMAL HIGH (ref 6.1–8.1)

## 2019-06-30 LAB — LIPID PANEL
Cholesterol: 155 mg/dL (ref <200)
HDL: 21 mg/dL — ABNORMAL LOW (ref >=40)
LDL Cholesterol (Calc): 111 mg/dL (calc) — ABNORMAL HIGH
Non-HDL Cholesterol (Calc): 134 mg/dL (calc) — ABNORMAL HIGH (ref <130)
Total CHOL/HDL Ratio: 7.4 (calc) — ABNORMAL HIGH (ref <5.0)
Triglycerides: 119 mg/dL (ref <150)

## 2019-06-30 LAB — RPR: RPR Ser Ql: REACTIVE — AB

## 2019-06-30 LAB — HIV-1 INTEGRASE GENOTYPE

## 2019-06-30 LAB — HIV RNA, RTPCR W/R GT (RTI, PI,INT)
HIV 1 RNA Quant: 6530 copies/mL — ABNORMAL HIGH
HIV-1 RNA Quant, Log: 3.81 Log copies/mL — ABNORMAL HIGH

## 2019-06-30 LAB — FLUORESCENT TREPONEMAL AB(FTA)-IGG-BLD: Fluorescent Treponemal ABS: REACTIVE — AB

## 2019-06-30 LAB — RPR TITER: RPR Titer: 1:64 {titer} — ABNORMAL HIGH

## 2019-07-01 ENCOUNTER — Other Ambulatory Visit: Payer: Self-pay | Admitting: Infectious Disease

## 2019-07-01 MED FILL — SYMTUZA 800-150-200-10 MG T: 800-150-200 | 30 days supply | Qty: 30 | Fill #0

## 2019-07-02 ENCOUNTER — Ambulatory Visit (INDEPENDENT_AMBULATORY_CARE_PROVIDER_SITE_OTHER): Payer: 59

## 2019-07-02 ENCOUNTER — Telehealth: Payer: Self-pay

## 2019-07-02 ENCOUNTER — Encounter (INDEPENDENT_AMBULATORY_CARE_PROVIDER_SITE_OTHER): Payer: 59 | Admitting: *Deleted

## 2019-07-02 ENCOUNTER — Other Ambulatory Visit: Payer: Self-pay

## 2019-07-02 VITALS — BP 147/92 | HR 97 | Temp 99.0°F | Wt 191.2 lb

## 2019-07-02 DIAGNOSIS — Z202 Contact with and (suspected) exposure to infections with a predominantly sexual mode of transmission: Secondary | ICD-10-CM

## 2019-07-02 DIAGNOSIS — Z006 Encounter for examination for normal comparison and control in clinical research program: Secondary | ICD-10-CM

## 2019-07-02 DIAGNOSIS — A64 Unspecified sexually transmitted disease: Secondary | ICD-10-CM

## 2019-07-02 DIAGNOSIS — A539 Syphilis, unspecified: Secondary | ICD-10-CM | POA: Diagnosis not present

## 2019-07-02 MED ORDER — AZITHROMYCIN 250 MG PO TABS
1000.0000 mg | ORAL_TABLET | Freq: Once | ORAL | Status: AC
  Administered 2019-07-02: 10:00:00 1000 mg via ORAL

## 2019-07-02 MED ORDER — PENICILLIN G BENZATHINE 1200000 UNIT/2ML IM SUSP
1.2000 10*6.[IU] | Freq: Once | INTRAMUSCULAR | Status: AC
  Administered 2019-07-02: 10:00:00 1.2 10*6.[IU] via INTRAMUSCULAR

## 2019-07-02 MED ORDER — DOXYCYCLINE HYCLATE 100 MG PO TABS: 100.0000 mg | ORAL_TABLET | Freq: Two times a day (BID) | ORAL | 0 refills | Status: AC

## 2019-07-02 MED ORDER — CEFTRIAXONE SODIUM 250 MG IJ SOLR
250.0000 mg | Freq: Once | INTRAMUSCULAR | Status: AC
  Administered 2019-07-02: 10:00:00 250 mg via INTRAMUSCULAR

## 2019-07-02 NOTE — Progress Notes (Signed)
Patient came into clinic today for STD treatment. Patient denies any allergies to medicine. Tolerated treatment well.  Patient understands he must contact recent partners to get tested treated at local health department. Instructed patient to refrain from ANY sexual encounter for at least 10 days after treatment.  Encouraged patient to walk, do squats,and massage backside for the next couple of days to help with soreness. Patient understands he can take tylenol/ibuprofen as needed for soreness.  Patient's mother was present during today's visit. Provided her information regarding syphillis. All questions answered during today's visit.  [place]

## 2019-07-02 NOTE — Telephone Encounter (Signed)
Patient came into clinic for STD treatment. States he did receive doxycyline on 12/7. Has only been taking one tab every day for a week. Patient states he didn't know he was supposed to take two tabs once a day. Will route message to MD to advise next steps. [place]

## 2019-07-02 NOTE — Telephone Encounter (Signed)
RN notified patient, sent in 7 day supply to the pharmacy of his choice. Landis Gandy, RN

## 2019-07-02 NOTE — Addendum Note (Signed)
Addended by: Landis Gandy on: 07/02/2019 01:52 PM   Modules accepted: Orders

## 2019-07-02 NOTE — Telephone Encounter (Signed)
I would have him repeat the treatment at 100 mg twice daily for 7 days

## 2019-07-02 NOTE — Research (Signed)
Micheal Leonard came in to day to consent and screen for the A5359 study ([place]). His mother was with him. I discussed the consent/study in detail with both of them and answered all their questions. Micheal Leonard was able to verbalize the purpose of the study and what he needs to do to participate. He did complete high school and is working at [employer] out right now at night and will be able to come to his appointments. He had significant congenital heart problems and had 2 open heart surgeries as a child/infant. We did do an EKG that had a slightly prolonged QT interval which Dr. Tommy Medal reviewed. We will repeat the EKG in 2 weeks to see if it is improved any, since he did have an EKG in April this year that had a normal QT interval. He was just treated today for both gonorrhea and syphilis prior to the visit. He has a lot of trouble remembering to take his meds and tends to never be quite suppressed. He is eager to start the study and get some adherence training to help him be successful at taking his meds. His mother fully approved of the him participating also.

## 2019-07-05 LAB — COMPREHENSIVE METABOLIC PANEL
AG Ratio: 0.8 (calc) — ABNORMAL LOW (ref 1.0–2.5)
ALT: 15 U/L (ref 9–46)
AST: 26 U/L (ref 10–40)
Albumin: 3.8 g/dL (ref 3.6–5.1)
Alkaline phosphatase (APISO): 131 U/L — ABNORMAL HIGH (ref 36–130)
BUN: 10 mg/dL (ref 7–25)
CO2: 20 mmol/L (ref 20–32)
Calcium: 8.8 mg/dL (ref 8.6–10.3)
Chloride: 102 mmol/L (ref 98–110)
Creat: 0.79 mg/dL (ref 0.60–1.35)
Globulin: 4.7 g/dL (calc) — ABNORMAL HIGH (ref 1.9–3.7)
Glucose, Bld: 153 mg/dL — ABNORMAL HIGH (ref 65–99)
Potassium: 3.9 mmol/L (ref 3.5–5.3)
Sodium: 134 mmol/L — ABNORMAL LOW (ref 135–146)
Total Bilirubin: 0.4 mg/dL (ref 0.2–1.2)
Total Protein: 8.5 g/dL — ABNORMAL HIGH (ref 6.1–8.1)

## 2019-07-05 LAB — HEPATITIS B SURFACE ANTIGEN: Hepatitis B Surface Ag: NONREACTIVE

## 2019-07-05 LAB — URINALYSIS, ROUTINE W REFLEX MICROSCOPIC
Bacteria, UA: NONE SEEN /HPF
Bilirubin Urine: NEGATIVE
Glucose, UA: NEGATIVE
Hgb urine dipstick: NEGATIVE
Hyaline Cast: NONE SEEN /LPF
Ketones, ur: NEGATIVE
Leukocytes,Ua: NEGATIVE
Nitrite: NEGATIVE
RBC / HPF: NONE SEEN /HPF (ref 0–2)
Specific Gravity, Urine: 1.025 (ref 1.001–1.03)
Squamous Epithelial / HPF: NONE SEEN /HPF (ref <=5)
WBC, UA: NONE SEEN /HPF (ref 0–5)
pH: <=5 (ref 5.0–8.0)

## 2019-07-05 LAB — BILIRUBIN, DIRECT: Bilirubin, Direct: 0.1 mg/dL (ref 0.0–0.2)

## 2019-07-05 LAB — HEPATITIS C ANTIBODY
Hepatitis C Ab: NONREACTIVE
SIGNAL TO CUT-OFF: 0.15 (ref <1.00)

## 2019-07-05 LAB — HEPATITIS B SURFACE ANTIBODY,QUALITATIVE: Hep B S Ab: NONREACTIVE

## 2019-07-05 LAB — HEPATITIS B CORE ANTIBODY, TOTAL: Hep B Core Total Ab: NONREACTIVE

## 2019-07-13 ENCOUNTER — Other Ambulatory Visit: Payer: Self-pay

## 2019-07-13 NOTE — Progress Notes (Signed)
After assessing COVID risk through questionnaire, home visit was made today (10/13). Micheal Leonard states he continues to struggle with medication adherence and is not sure what he can do to increase his commitment to adherence. We discussed counseling services, medication reminders and continued home visits. Right now Micheal Leonard is not interested in counseling. He would like to continue communicating with me and asked for the lasted office visit he can receive. Office visit scheduled with Dr Lucianne Lei for November. Request for additional visit will be submitted

## 2019-07-15 ENCOUNTER — Encounter (INDEPENDENT_AMBULATORY_CARE_PROVIDER_SITE_OTHER): Payer: 59 | Admitting: *Deleted

## 2019-07-15 ENCOUNTER — Ambulatory Visit: Payer: Self-pay | Admitting: *Deleted

## 2019-07-15 ENCOUNTER — Telehealth: Payer: Self-pay | Admitting: *Deleted

## 2019-07-15 ENCOUNTER — Other Ambulatory Visit: Payer: Self-pay

## 2019-07-15 DIAGNOSIS — Z006 Encounter for examination for normal comparison and control in clinical research program: Secondary | ICD-10-CM

## 2019-07-15 DIAGNOSIS — B20 Human immunodeficiency virus [HIV] disease: Secondary | ICD-10-CM

## 2019-07-15 NOTE — Research (Signed)
Micheal Leonard came in today to do an EKG for screening for A5359 study, a study looking at using injectables for patients having trouble getting suppressed on their antiretrovirals.  The EKG was repeated and at this time his QTC interval is 478ms, making him eligible for the study. He is currently on Symtuza but it is not provided by the study. I think he would benefit from [place] providing his meds at each visit so we can more accurately counts his pills and work on his adherence. He said that 2 pills would be okay at this time, so maybe we could use prezcobix and descovy from the study. I will discuss with Dr. Tommy Medal. We have planned to enroll him in 2 weeks.

## 2019-07-15 NOTE — Telephone Encounter (Addendum)
Received return text message from Micheal Leonard stating he feels ok about the study and taking 2 pills instead of 1. I offered assistance and Micheal Leonard stated he could use a little "baggie" of barriers/protection. Visit planned for today.

## 2019-07-15 NOTE — Telephone Encounter (Signed)
RN contacted the patient today. Purpose of the call is to stay connected with the patient. RN left a message asking the patient about his appt today, what was his thoughts and could I assist him in anyway with completing the study

## 2019-07-19 ENCOUNTER — Ambulatory Visit: Payer: 59 | Admitting: Infectious Disease

## 2019-07-19 ENCOUNTER — Other Ambulatory Visit: Payer: Self-pay

## 2019-07-20 ENCOUNTER — Encounter: Payer: Self-pay | Admitting: Infectious Disease

## 2019-07-20 ENCOUNTER — Ambulatory Visit (INDEPENDENT_AMBULATORY_CARE_PROVIDER_SITE_OTHER): Payer: 59 | Admitting: Infectious Disease

## 2019-07-20 ENCOUNTER — Telehealth: Payer: Self-pay | Admitting: *Deleted

## 2019-07-20 DIAGNOSIS — A539 Syphilis, unspecified: Secondary | ICD-10-CM

## 2019-07-20 DIAGNOSIS — A549 Gonococcal infection, unspecified: Secondary | ICD-10-CM | POA: Insufficient documentation

## 2019-07-20 DIAGNOSIS — A564 Chlamydial infection of pharynx: Secondary | ICD-10-CM | POA: Diagnosis not present

## 2019-07-20 DIAGNOSIS — B20 Human immunodeficiency virus [HIV] disease: Secondary | ICD-10-CM | POA: Diagnosis not present

## 2019-07-20 HISTORY — DX: Gonococcal infection, unspecified: A54.9

## 2019-07-20 HISTORY — DX: Syphilis, unspecified: A53.9

## 2019-07-20 NOTE — Progress Notes (Signed)
Home visit made, visit conducted outside the home with COVID precautions maintained. Patient given a supply of barrier and lubricants with education provided. Micheal Leonard appears excited to be a part of the research study so I strongly encouraged him to comply with the 2 pill regimen. [name] verbalized understanding. He declines transportation needs or any other concerns at this time. Patient appears stable but noted sporadic non linear rashes (resembling atopic dermatitis without lichenification) on his proximal arms and legs with thinning hair.

## 2019-07-20 NOTE — Telephone Encounter (Signed)
  Individualized Plan Of Care 07/31/19 to 10/29/19  a. Type of service(s) and care to be delivered: RN Case Management  b. Frequency and duration of service: Effective 07/31/19: 46mo4, 4 prns for complications with disease process/progression, medication changes or concerns .   Visits/Contact may be conducted telephonically or in person to best suit the patient.  c. Activity restrictions: Pt may be up as tolerated and can safely ambulate without the need for a assistive device   d. Safety Measures: Standard Precautions/Infection Control  And Disease Management  e. Service Objectives and Goals: Service Objectives are to assist the pt with HIV medication regimen adherence and staying in care with the Infectious Disease Clinic by identifying barriers to care. RN  will address the barriers that are identified by the patient. Currently the patient is participating in the research study at [facility] for injectable medications.  RN's focus will be on keeping the stated goals at the center of the plan of care. Education will center around how to successfully reach his goals with behavioral changes. Patient struggles with adherence when he feels unsupported. Goal is to increase his support system.   f. Equipment required: No additional equipment needs at this time   g. Functional Limitations: Cognitive delay but functional  h. Rehabilitation potential: Guarded   i. Diet and Nutritional Needs: Regular Diet   j. Medications and treatments: Medications have been reconciled and reviewed and are a part of EPIC electronic file   k. Specific therapies if needed: RN   l. Pertinent diagnoses: New HIV disease   m. Expected outcome: Guarded

## 2019-07-20 NOTE — Progress Notes (Signed)
Virtual Visit via Telephone Note  I connected with Tadeusz Stahl on 07/20/19 at 10:00 AM EST by telephone and verified that I am speaking with the correct person using two identifiers.  Location: Patient: Home Provider: [facility]   I discussed the limitations, risks, security and privacy concerns of performing an evaluation and management service by telephone and the availability of in person appointments. I also discussed with the patient that there may be a patient responsible charge related to this service. The patient expressed understanding and agreed to proceed.  Chief complaint: Follow-up for HIV disease  History of Present Illness:  year old with hx of cognitive delay, congenital heart disease sp fontane procedure at [facility] originally  admitted with acute HIV syndrome in 2018 with cervical LA, fevers, and pharyngitis. He had workp that included HIV which was + with VL of half a million.   He was on [place] and Descovy and suppressed virus but had biilateral rash on arms and loose stools and then change to Genvoya after BIKTARVY was not OK with insurance.  We were worried about him having risk for VF with resistance and added Prezcobix to BIKTARVY. He did not like the symptoms he was having on this and taking 2 tablets. He was changed to [facility] by [name] in May of 2019.   He was suppressed at that time.   He has continued to show intermittent adherence to ARV  He has struggled with taking daily ARV and only brought virus down to few thousand copies.  He claims to experience rash with starting [facility] --this in fact turned out to have nothing to do with [facility] had to do the fact that he had syphilis.  We are going to enroll him into the ACT G 359 study.  Initial EKG had a QT prolongation that would have precluded him from the study but follow-up EKG was within range that was acceptable.  He has appointment coming up with Selena Batten on the 14th.  We will switch him over to  Prezcobix and Descovy through the study and he will get his medications through this.  His rash has started resolved with treatment of the syphilis and he is in good spirits   Observations/Objective:  24 year old African-American man living with HIV but with poor adherence with antiretrovirals going to get enrolled into a CG study.  He has had coinfection's with several STIs as documented before.    Assessment and Plan: HIV disease: Enroll into study and give Prezcobix and Descovy through study.  You have opportunity to get into the long-acting injectable arm as if he gets suppressed through 6 months.  Syphilis status post treatment of penicillin  And chlamydia also status post ceftriaxone and azithromycin as well as doxycycline.  Need to have be screened again for these STIs in the future   Follow Up Instructions:    I discussed the assessment and treatment plan with the patient. The patient was provided an opportunity to ask questions and all were answered. The patient agreed with the plan and demonstrated an understanding of the instructions.   The patient was advised to call back or seek an in-person evaluation if the symptoms worsen or if the condition fails to improve as anticipated.  I provided 15 minutes of non-face-to-face time during this encounter.   Acey Lav, MD

## 2019-07-21 NOTE — Telephone Encounter (Signed)
I am in agreement with this plan of care

## 2019-07-27 ENCOUNTER — Other Ambulatory Visit: Payer: Self-pay | Admitting: Infectious Disease

## 2019-07-29 ENCOUNTER — Encounter: Payer: 59 | Admitting: *Deleted

## 2019-08-02 ENCOUNTER — Encounter (INDEPENDENT_AMBULATORY_CARE_PROVIDER_SITE_OTHER): Payer: 59 | Admitting: *Deleted

## 2019-08-02 ENCOUNTER — Other Ambulatory Visit: Payer: Self-pay

## 2019-08-02 VITALS — BP 150/94 | HR 78 | Temp 98.2°F | Wt 196.2 lb

## 2019-08-02 DIAGNOSIS — Z006 Encounter for examination for normal comparison and control in clinical research program: Secondary | ICD-10-CM

## 2019-08-02 LAB — LIPID PANEL
Cholesterol: 167 mg/dL (ref <200)
HDL: 35 mg/dL — ABNORMAL LOW (ref >=40)
LDL Cholesterol (Calc): 105 mg/dL (calc) — ABNORMAL HIGH
Non-HDL Cholesterol (Calc): 132 mg/dL (calc) — ABNORMAL HIGH (ref <130)
Total CHOL/HDL Ratio: 4.8 (calc) (ref <5.0)
Triglycerides: 154 mg/dL — ABNORMAL HIGH (ref <150)

## 2019-08-02 LAB — COMPREHENSIVE METABOLIC PANEL
AG Ratio: 1.1 (calc) (ref 1.0–2.5)
ALT: 15 U/L (ref 9–46)
AST: 24 U/L (ref 10–40)
Albumin: 4.3 g/dL (ref 3.6–5.1)
Alkaline phosphatase (APISO): 79 U/L (ref 36–130)
BUN: 10 mg/dL (ref 7–25)
CO2: 28 mmol/L (ref 20–32)
Calcium: 9.3 mg/dL (ref 8.6–10.3)
Chloride: 107 mmol/L (ref 98–110)
Creat: 0.88 mg/dL (ref 0.60–1.35)
Globulin: 4 g/dL (calc) — ABNORMAL HIGH (ref 1.9–3.7)
Glucose, Bld: 91 mg/dL (ref 65–99)
Potassium: 4 mmol/L (ref 3.5–5.3)
Sodium: 142 mmol/L (ref 135–146)
Total Bilirubin: 0.6 mg/dL (ref 0.2–1.2)
Total Protein: 8.3 g/dL — ABNORMAL HIGH (ref 6.1–8.1)

## 2019-08-02 LAB — BILIRUBIN, DIRECT: Bilirubin, Direct: 0.1 mg/dL (ref 0.0–0.2)

## 2019-08-02 MED ORDER — STUDY - A5359 (STEP 1) - EMTRICITABINE/TENOFOVIR ALAFENAMIDE (DESCOVY) TABLET (PI-VAN DAM): 1.0000 | ORAL_TABLET | Freq: Every day | ORAL | Status: DC

## 2019-08-02 MED ORDER — STUDY - A5359 (STEP 1) - DARUNAVIR/COBICISTAT (PREZCOBIX) TABLET (PI-VAN DAM): 1.0000 | ORAL_TABLET | Freq: Every day | ORAL | Status: DC

## 2019-08-02 NOTE — Addendum Note (Signed)
Addended by: Phill Myron on: 08/02/2019 09:43 AM   Modules accepted: Orders, SmartSet

## 2019-08-02 NOTE — Research (Signed)
Micheal Leonard came in today to enroll in the a5359 study evaluating Long acting injectables in people with HIV. I verified his eligibility and that he understood what the study was about prior to randomization. He will start prezcobix and descovy today. Adherence training was done and he was given a pill box and calendar to help him remember his meds as well as his next appt with [place] in 2 weeks. He denied any new problems or other medications.

## 2019-08-16 ENCOUNTER — Encounter (INDEPENDENT_AMBULATORY_CARE_PROVIDER_SITE_OTHER): Payer: 59 | Admitting: *Deleted

## 2019-08-16 ENCOUNTER — Other Ambulatory Visit: Payer: Self-pay

## 2019-08-16 VITALS — BP 115/76 | HR 95 | Temp 98.7°F | Wt 194.2 lb

## 2019-08-16 DIAGNOSIS — Z006 Encounter for examination for normal comparison and control in clinical research program: Secondary | ICD-10-CM

## 2019-08-16 NOTE — Research (Signed)
Micheal Leonard was here for his 2 week followup after enrollment in to [phone]. He only took 4 doses over the past 2 weeks of his study meds. He says he forgets to take them when he stays with his friend and doesn't take the pills with him. He is using the calendar to mark off days when he takes them. We discussed ways to avoid missing them. He promises to go ahead and keep the 2 bottles of pills in his go bag which he uses when he goes to stay with his friend. He also has a pill box with 2 weeks worth already filled out. He said he would tell his friend to remind him to take them when he is with him. He said he stopped feeling tired after he talked to me last week about it. I also gave him a larger pill container that will hold 2 doses which he keeps around his neck on a chain. He is due to come back in 2 weeks and then will see Micheal Leonard the week after that.

## 2019-08-30 ENCOUNTER — Encounter (INDEPENDENT_AMBULATORY_CARE_PROVIDER_SITE_OTHER): Payer: 59 | Admitting: *Deleted

## 2019-08-30 ENCOUNTER — Other Ambulatory Visit: Payer: Self-pay

## 2019-08-30 VITALS — BP 149/89 | HR 82 | Temp 98.4°F | Wt 198.0 lb

## 2019-08-30 DIAGNOSIS — Z006 Encounter for examination for normal comparison and control in clinical research program: Secondary | ICD-10-CM

## 2019-08-30 NOTE — Research (Signed)
Micheal Leonard was here for his week 4 study visit for the Latitude study. He continues to struggle with adherence. He had started using the calendar as instructed but was marking off days even when he didn't take them. He also forgot again to take pills with him when he went to his friends and stayed a few days. We discussed adherence strategies such as setting the phone alarm. His mother is also reminding him to take his meds when he is at home. He is looking for a new job since he got fired from [employer] and says he is going back to work at [employer]. I filled up 2 weeks of the pill boxes for him to start off this month with. He will be returning in 4 weeks.

## 2019-09-02 LAB — CBC WITH DIFFERENTIAL/PLATELET
Absolute Monocytes: 387 cells/uL (ref 200–950)
Basophils Absolute: 29 cells/uL (ref 0–200)
Basophils Relative: 0.9 %
Eosinophils Absolute: 170 cells/uL (ref 15–500)
Eosinophils Relative: 5.3 %
HCT: 49.2 % (ref 38.5–50.0)
Hemoglobin: 16.1 g/dL (ref 13.2–17.1)
Lymphs Abs: 1645 cells/uL (ref 850–3900)
MCH: 28 pg (ref 27.0–33.0)
MCHC: 32.7 g/dL (ref 32.0–36.0)
MCV: 85.6 fL (ref 80.0–100.0)
MPV: 12 fL (ref 7.5–12.5)
Monocytes Relative: 12.1 %
Neutro Abs: 970 cells/uL — ABNORMAL LOW (ref 1500–7800)
Neutrophils Relative %: 30.3 %
Platelets: 215 10*3/uL (ref 140–400)
RBC: 5.75 10*6/uL (ref 4.20–5.80)
RDW: 15.1 % — ABNORMAL HIGH (ref 11.0–15.0)
Total Lymphocyte: 51.4 %
WBC: 3.2 10*3/uL — ABNORMAL LOW (ref 3.8–10.8)

## 2019-09-02 LAB — BILIRUBIN, DIRECT: Bilirubin, Direct: 0.1 mg/dL (ref 0.0–0.2)

## 2019-09-02 LAB — COMPREHENSIVE METABOLIC PANEL
AG Ratio: 1.2 (calc) (ref 1.0–2.5)
ALT: 20 U/L (ref 9–46)
AST: 24 U/L (ref 10–40)
Albumin: 4.7 g/dL (ref 3.6–5.1)
Alkaline phosphatase (APISO): 76 U/L (ref 36–130)
BUN: 8 mg/dL (ref 7–25)
CO2: 27 mmol/L (ref 20–32)
Calcium: 9.8 mg/dL (ref 8.6–10.3)
Chloride: 105 mmol/L (ref 98–110)
Creat: 0.91 mg/dL (ref 0.60–1.35)
Globulin: 3.9 g/dL (calc) — ABNORMAL HIGH (ref 1.9–3.7)
Glucose, Bld: 137 mg/dL — ABNORMAL HIGH (ref 65–99)
Potassium: 4.6 mmol/L (ref 3.5–5.3)
Sodium: 140 mmol/L (ref 135–146)
Total Bilirubin: 0.5 mg/dL (ref 0.2–1.2)
Total Protein: 8.6 g/dL — ABNORMAL HIGH (ref 6.1–8.1)

## 2019-09-02 LAB — HIV-1 RNA QUANT-NO REFLEX-BLD
HIV 1 RNA Quant: 955 copies/mL — ABNORMAL HIGH
HIV-1 RNA Quant, Log: 2.98 Log copies/mL — ABNORMAL HIGH

## 2019-09-08 ENCOUNTER — Other Ambulatory Visit: Payer: Self-pay

## 2019-09-08 ENCOUNTER — Ambulatory Visit (INDEPENDENT_AMBULATORY_CARE_PROVIDER_SITE_OTHER): Payer: 59 | Admitting: Infectious Disease

## 2019-09-08 ENCOUNTER — Encounter: Payer: Self-pay | Admitting: Infectious Disease

## 2019-09-08 DIAGNOSIS — A549 Gonococcal infection, unspecified: Secondary | ICD-10-CM

## 2019-09-08 DIAGNOSIS — A564 Chlamydial infection of pharynx: Secondary | ICD-10-CM | POA: Diagnosis not present

## 2019-09-08 DIAGNOSIS — B20 Human immunodeficiency virus [HIV] disease: Secondary | ICD-10-CM | POA: Diagnosis not present

## 2019-09-08 DIAGNOSIS — Z91199 Patient's noncompliance with other medical treatment and regimen due to unspecified reason: Secondary | ICD-10-CM | POA: Insufficient documentation

## 2019-09-08 DIAGNOSIS — A539 Syphilis, unspecified: Secondary | ICD-10-CM | POA: Diagnosis not present

## 2019-09-08 DIAGNOSIS — Z9119 Patient's noncompliance with other medical treatment and regimen: Secondary | ICD-10-CM

## 2019-09-08 HISTORY — DX: Patient's noncompliance with other medical treatment and regimen: Z91.19

## 2019-09-08 HISTORY — DX: Patient's noncompliance with other medical treatment and regimen due to unspecified reason: Z91.199

## 2019-09-08 NOTE — Progress Notes (Signed)
Virtual Visit via Telephone Note  I connected with Micheal Leonard on 09/08/19 at  3:00 PM EST by telephone and verified that I am speaking with the correct person using two identifiers.  Location: Patient: Micheal Leonard turned out to be in a car  Provider: [facility]   I discussed the limitations, risks, security and privacy concerns of performing an evaluation and management service by telephone and the availability of in person appointments. I also discussed with the patient that there may be a patient responsible charge related to this service. The patient expressed understanding and agreed to proceed.    History of Present Illness:  Micheal Leonard is a 24 year old African-American man living with HIV.  Micheal Leonard has struggled a great deal with adherence and is currently enrolled in our LATTITUDE study Prezcobix and Descovy.  Micheal Leonard has not yet been able to suppress his virus and I worry if Micheal Leonard is not improve his adherence Micheal Leonard will end up not being able to participate in the study anymore.  We spent a great deal of this talk on the phone trying to problem shoot his remembering to take pills every day Micheal Leonard says Micheal Leonard frequently has trouble remembering.  Some of the issues that revolve around this have to do with his struggles with depression and how HIV is one of the things in his life story that does contribute to his depression.  Micheal Leonard also says that Micheal Leonard is in between jobs and Micheal Leonard is been stressed about trying to find a new job.  His mother is supportive but works during the day.  Upon talking to him I asked if there was a particular time that Micheal Leonard is with his mother the day on a consistent basis and it sounds like they do eat dinner together.  Suggested that Micheal Leonard take his antivirals with dinner to make it a consistent pattern in his day.    Past Medical History:  Diagnosis Date  . Cardiac anomaly, congenital   . Diarrhea 01/22/2017  . EBV infection    hx/notes 10/23/2016  . Gonorrhea 07/20/2019  . Hemoptysis 10/23/2016   [name]  10/23/2016  . HIV (human immunodeficiency virus infection) (HCC)   . Hypertension   . Migraine    "a few/year now; frequency is less than in the past" (10/23/2016)  . Pharyngitis 10/23/2016   notes 10/23/2016  . Pulmonary embolism (HCC) 01/29/2016  . S/P bidirectional Glenn shunt    hx/notes 10/23/2016  . S/P Fontan procedure    hx/notes 10/23/2016  . Syphilis 07/20/2019  . Tricuspid atresia and stenosis, congenital     Past Surgical History:  Procedure Laterality Date  . CARDIAC CATHETERIZATION  01/2016   at [facility]; hx/notes 10/23/2016  . CARDIAC SURGERY  01/1997; 2000   fontane procedure at [facility] as a child     Family History  Problem Relation Age of Onset  . Hypertension Father       Social History   Socioeconomic History  . Marital status: Significant Other    Spouse name: Not on file  . Number of children: Not on file  . Years of education: Not on file  . Highest education level: Not on file  Occupational History  . Not on file  Tobacco Use  . Smoking status: Never Smoker  . Smokeless tobacco: Never Used  Substance and Sexual Activity  . Alcohol use: Yes    Comment: 2 GLASSES A WEEK  . Drug use: Yes    Frequency: 2.0 times per week    Types:  Marijuana  . Sexual activity: Yes  Other Topics Concern  . Not on file  Social History Narrative  . Not on file   Social Determinants of Health   Financial Resource Strain:   . Difficulty of Paying Living Expenses: Not on file  Food Insecurity:   . Worried About Charity fundraiser in the Last Year: Not on file  . Ran Out of Food in the Last Year: Not on file  Transportation Needs:   . Lack of Transportation (Medical): Not on file  . Lack of Transportation (Non-Medical): Not on file  Physical Activity:   . Days of Exercise per Week: Not on file  . Minutes of Exercise per Session: Not on file  Stress:   . Feeling of Stress : Not on file  Social Connections:   . Frequency of Communication with Friends and Family: Not  on file  . Frequency of Social Gatherings with Friends and Family: Not on file  . Attends Religious Services: Not on file  . Active Member of Clubs or Organizations: Not on file  . Attends Archivist Meetings: Not on file  . Marital Status: Not on file    No Known Allergies   Current Outpatient Medications:  .  Study - L5726 (Step 1) - darunavir/cobicistat (PREZCOBIX) tablet (PI-[place]), Take 1 tablet by mouth daily., Disp:  , Rfl:  .  Study - O0355 (Step 1) - emtricitabine/tenofovir alafenamide (DESCOVY) tablet ([place]), Take 1 tablet by mouth daily., Disp:  , Rfl:  .  ibuprofen (ADVIL) 600 MG tablet, Take 1 tablet (600 mg total) by mouth every 6 (six) hours as needed. (Patient not taking: Reported on 09/08/2019), Disp: 30 tablet, Rfl: 0 .  metoCLOPramide (REGLAN) 10 MG tablet, Take 1 tablet (10 mg total) by mouth every 6 (six) hours. (Patient not taking: Reported on 09/08/2019), Disp: 10 tablet, Rfl: 0     Observations/Objective:  Gearl was in good spirits but I do worry about his ability to adhere to antivirals.  If Micheal Leonard could get through the first 6 months of his current study Micheal Leonard would have a potential to be on long-acting injectable drugs and I suspect they will be more effective with him than having to rely on him taking medications in his home consistently.    Assessment and Plan:  HIV disease: See above discussion continues to struggle with adherence hopefully Micheal Leonard can improve in the study I would not schedule an appointment around time of lunch so they can hopefully talk to him and his mother to heart improve his adherence strategies.  In the interim Micheal Leonard is going to see Maudie Mercury on March 15  Since talked about the importance of treating his HIV standpoint of treating his own health and restoring his immune system but also in terms of preventing infection and transmission to others particularly in light of his history of STIs.    Follow Up Instructions:    I discussed  the assessment and treatment plan with the patient. The patient was provided an opportunity to ask questions and all were answered. The patient agreed with the plan and demonstrated an understanding of the instructions.   The patient was advised to call back or seek an in-person evaluation if the symptoms worsen or if the condition fails to improve as anticipated.  I provided 22 minutes of non-face-to-face time during this encounter.   Alcide Evener, MD

## 2019-09-27 ENCOUNTER — Encounter (INDEPENDENT_AMBULATORY_CARE_PROVIDER_SITE_OTHER): Payer: 59 | Admitting: *Deleted

## 2019-09-27 ENCOUNTER — Other Ambulatory Visit: Payer: Self-pay

## 2019-09-27 VITALS — BP 125/80 | HR 92 | Temp 98.2°F | Wt 202.2 lb

## 2019-09-27 DIAGNOSIS — Z006 Encounter for examination for normal comparison and control in clinical research program: Secondary | ICD-10-CM

## 2019-09-27 NOTE — Research (Signed)
Micheal Leonard was here for his week 8 visit for A5359. His adherence has been much better this time at 100% by pill counts. He had talked with Dr. [name] on 2/24 about that and it seems to have made a difference. He is using a pill box which I filled up for him before he left. He says he has been taking them when he eats dinner which can vary by a few hours. He denies any other new problems or medications and will be returning in 4 weeks.

## 2019-09-29 LAB — HIV-1 RNA QUANT-NO REFLEX-BLD
HIV 1 RNA Quant: 88400 copies/mL — ABNORMAL HIGH
HIV-1 RNA Quant, Log: 4.95 Log copies/mL — ABNORMAL HIGH

## 2019-10-12 ENCOUNTER — Ambulatory Visit: Payer: 59 | Admitting: Infectious Disease

## 2019-10-14 ENCOUNTER — Telehealth: Payer: Self-pay | Admitting: *Deleted

## 2019-10-14 ENCOUNTER — Ambulatory Visit: Payer: 59 | Admitting: Infectious Disease

## 2019-10-14 NOTE — Telephone Encounter (Signed)
HE Could it  depending on the drug the study could provide it or he could be on a nonstudy drug through insurance and still be in study. He wont be able to stay in the study if he doesn't get suppressed and stay that way  Is it now only 12 weeks he needs to be suppressed before randomization Selena Batten

## 2019-10-14 NOTE — Telephone Encounter (Signed)
Call Rithvik today due to missed visit. Bion stated he completely forgot about his appt today.   Manly stated he would like to know if the research study offers a 1 pill regimen because the 2 pill regimen is not working for him. Jermario states the Prezcobix makes him extremely tired and hinders his ability to work. I suggested he take it at night. Mattix states he work at night and cannot do that.  Requested a bag of condoms but stated he will not be home today. Next visit planned for tomorrow to deliver supplies

## 2019-10-15 ENCOUNTER — Ambulatory Visit: Payer: Self-pay | Admitting: *Deleted

## 2019-10-15 DIAGNOSIS — Z91199 Patient's noncompliance with other medical treatment and regimen due to unspecified reason: Secondary | ICD-10-CM

## 2019-10-15 DIAGNOSIS — Z9119 Patient's noncompliance with other medical treatment and regimen: Secondary | ICD-10-CM

## 2019-10-18 ENCOUNTER — Telehealth: Payer: Self-pay | Admitting: *Deleted

## 2019-10-18 ENCOUNTER — Other Ambulatory Visit: Payer: Self-pay

## 2019-10-18 NOTE — Progress Notes (Signed)
Home visit made with Micheal Leonard to deliver safe sex supplies. Micheal Leonard and I discussed his current job at [employer] and his struggles with medication adherence. He currently states the medications make him too tired to fulfill his work responsibilities and wonders if he can switch to a 1 pill regimen. I have passed the message to Dr. [name]. He is currently enrolled in a study for possible monthly injectables to treat his HIV and in the past we have tried [facility] therapy, assistance with medication management, and changing his regimen with little affect on his adherence. I do not think Micheal Leonard is ready to commit to taking his medications. I have provided him with protective barriers and lubricants today and  I am here to support him when he becomes ready.

## 2019-10-25 ENCOUNTER — Encounter: Payer: 59 | Admitting: *Deleted

## 2019-10-25 ENCOUNTER — Other Ambulatory Visit: Payer: Self-pay | Admitting: *Deleted

## 2019-10-25 ENCOUNTER — Other Ambulatory Visit: Payer: Self-pay | Admitting: Infectious Disease

## 2019-10-25 MED ORDER — DARUN-COBIC-EMTRICIT-TENOFAF 800-150-200-10 MG PO TABS: 1.0000 | ORAL_TABLET | Freq: Every day | ORAL | 5 refills | Status: DC

## 2019-10-25 MED FILL — SYMTUZA 800-150-200-10 MG T: 800-150-200 | 30 days supply | Qty: 30 | Fill #0

## 2019-10-27 ENCOUNTER — Ambulatory Visit: Payer: 59 | Admitting: Infectious Disease

## 2019-10-27 ENCOUNTER — Encounter: Payer: 59 | Admitting: *Deleted

## 2019-10-28 ENCOUNTER — Other Ambulatory Visit: Payer: Self-pay

## 2019-10-28 ENCOUNTER — Telehealth: Payer: Self-pay

## 2019-10-28 DIAGNOSIS — B20 Human immunodeficiency virus [HIV] disease: Secondary | ICD-10-CM

## 2019-10-28 DIAGNOSIS — Z79899 Other long term (current) drug therapy: Secondary | ICD-10-CM

## 2019-10-28 DIAGNOSIS — Z113 Encounter for screening for infections with a predominantly sexual mode of transmission: Secondary | ICD-10-CM

## 2019-10-28 NOTE — Telephone Encounter (Signed)
Brooks from [facility] contacted RN to inform office that patient has been named as a contact with possible exposure to syphilis. Asked for new RPR to be drawn at next appointment with Dr. [name]. Orders placed but instructed Shon Baton that if patient needs to be tested, it would be better for him to be tested at an urgent care or the health department and treated there. She stated she'll ask the patient to get tested but wanted the labs to remain in case she cannot convince him to do so before the appointment on the 20th with Dr. [name].   Rosanna Randy, RN

## 2019-10-31 NOTE — Telephone Encounter (Signed)
The intent of this communication is to inform the Health Care Team that this patient will be discharged from [facility]).  Moving forward, the [facility] will be willing to reopen the patient to services if and when the patient is ready to discuss medication adherence and HIV disease  management. Effective 10/30/19,  patient will be discharged and removed from [facility] active patient listing.

## 2019-11-02 ENCOUNTER — Ambulatory Visit (INDEPENDENT_AMBULATORY_CARE_PROVIDER_SITE_OTHER): Payer: 59 | Admitting: Infectious Disease

## 2019-11-02 ENCOUNTER — Encounter: Payer: Self-pay | Admitting: Infectious Disease

## 2019-11-02 ENCOUNTER — Encounter (INDEPENDENT_AMBULATORY_CARE_PROVIDER_SITE_OTHER): Payer: Self-pay | Admitting: *Deleted

## 2019-11-02 ENCOUNTER — Other Ambulatory Visit: Payer: Self-pay

## 2019-11-02 ENCOUNTER — Other Ambulatory Visit (HOSPITAL_COMMUNITY)
Admission: RE | Admit: 2019-11-02 | Discharge: 2019-11-02 | Disposition: A | Discharge status: Home / Self Care | Payer: 59 | Source: Ambulatory Visit | Attending: Infectious Disease | Admitting: Infectious Disease

## 2019-11-02 VITALS — BP 152/94 | HR 87 | Temp 98.9°F | Wt 201.0 lb

## 2019-11-02 VITALS — BP 151/87 | HR 84 | Temp 98.4°F | Ht 72.0 in | Wt 202.0 lb

## 2019-11-02 DIAGNOSIS — Z91199 Patient's noncompliance with other medical treatment and regimen due to unspecified reason: Secondary | ICD-10-CM

## 2019-11-02 DIAGNOSIS — B20 Human immunodeficiency virus [HIV] disease: Secondary | ICD-10-CM

## 2019-11-02 DIAGNOSIS — A539 Syphilis, unspecified: Secondary | ICD-10-CM | POA: Diagnosis not present

## 2019-11-02 DIAGNOSIS — Z113 Encounter for screening for infections with a predominantly sexual mode of transmission: Secondary | ICD-10-CM | POA: Insufficient documentation

## 2019-11-02 DIAGNOSIS — Z79899 Other long term (current) drug therapy: Secondary | ICD-10-CM

## 2019-11-02 DIAGNOSIS — Z9119 Patient's noncompliance with other medical treatment and regimen: Secondary | ICD-10-CM | POA: Diagnosis not present

## 2019-11-02 DIAGNOSIS — F819 Developmental disorder of scholastic skills, unspecified: Secondary | ICD-10-CM | POA: Diagnosis not present

## 2019-11-02 DIAGNOSIS — Z006 Encounter for examination for normal comparison and control in clinical research program: Secondary | ICD-10-CM

## 2019-11-02 NOTE — Research (Signed)
Micheal Leonard came in today for his week 12 visit for the Latitude study. He did not like the way that Prexcobix and Descovy made him feel and he stopped it. We did get a new prescription for Symtuza and he brought those with him today. He has only taken 3 of the symtuza so far and received them a week ago. He is wearing a pill holder around his neck (2) in case he goes and stays with a friend and doesn't have his meds with him. We did discuss in detail adherence strategies like setting his meds where he will see them and be reminded to take them everyday and tying it in with a routine he does everyday. He has started working at [employer] and says his schedule varies so that may contribute some to his poor adherence. He says the Symtuza do not cause him to have any side effects. We will start having [facility] mail his meds here so we can control when he gets them sonce he is supposed to be coming in every month right now. Dr. [name] gave him 2 extra bottles as well. He will be returning in 3 weeks since he was a week late for this one.

## 2019-11-02 NOTE — Progress Notes (Signed)
Subjective:  Chief complaint I have a busy day today  Patient ID: Micheal Leonard, male    DOB: January 24, 1996, 24 y.o.   MRN: [id number]  HPI   Micheal Leonard is a 24 year old African-American man living with HIV who is enrolled in our LATTITUDE study via [facility] and had not been able to suppress his virus on Prezcobix DESCOVY provided through study.  He wished to go back on single tablet regimen and we placed him back on Symtuza where he is getting the medications outside of the study but still in the study.  He last signed for [facility] on 13 April.  In actual fact himself cannot sign but his father signed for the medications.  He started them last Friday and took 3 days worth of medicines that then list missed at least one if not 2 days of medicine.  Is here to see Reeser today and also to see me.  I provided him with 2 bottles worth of Symtuza which with his 1 at home would be nearly 3 months worth of medications.  Going forward will ship the medications here to the clinic to make sure that there handed off to him in person.  Besides that he needs to take his meds every single day and not skip even a few doses per week as he seems to be doing.  I have also strongly recommended that he get the COVID-19 vaccine  Past Medical History:  Diagnosis Date  . Cardiac anomaly, congenital   . Diarrhea 01/22/2017  . EBV infection    hx/notes 10/23/2016  . Gonorrhea 07/20/2019  . Hemoptysis 10/23/2016   [name] 10/23/2016  . HIV (human immunodeficiency virus infection) (HCC)   . Hypertension   . Migraine    "a few/year now; frequency is less than in the past" (10/23/2016)  . Nonadherence to medical treatment 09/08/2019  . Pharyngitis 10/23/2016   notes 10/23/2016  . Pulmonary embolism (HCC) 01/29/2016  . S/P bidirectional Glenn shunt    hx/notes 10/23/2016  . S/P Fontan procedure    hx/notes 10/23/2016  . Syphilis 07/20/2019  . Tricuspid atresia and stenosis, congenital     Past Surgical History:  Procedure  Laterality Date  . CARDIAC CATHETERIZATION  01/2016   at [facility]; hx/notes 10/23/2016  . CARDIAC SURGERY  01/1997; 2000   fontane procedure at [facility] as a child     Family History  Problem Relation Age of Onset  . Hypertension Father       Social History   Socioeconomic History  . Marital status: Significant Other    Spouse name: Not on file  . Number of children: Not on file  . Years of education: Not on file  . Highest education level: Not on file  Occupational History  . Not on file  Tobacco Use  . Smoking status: Never Smoker  . Smokeless tobacco: Never Used  Substance and Sexual Activity  . Alcohol use: Yes    Comment: 2 GLASSES A WEEK  . Drug use: Yes    Frequency: 2.0 times per week    Types: Marijuana  . Sexual activity: Yes    Partners: Male    Birth control/protection: Condom    Comment: given condoms  Other Topics Concern  . Not on file  Social History Narrative  . Not on file   Social Determinants of Health   Financial Resource Strain:   . Difficulty of Paying Living Expenses:   Food Insecurity:   . Worried About [employer] of  Food in the Last Year:   . [place] in the Last Year:   Transportation Needs:   . Film/video editor (Medical):   Marland Kitchen Lack of Transportation (Non-Medical):   Physical Activity:   . Days of Exercise per Week:   . Minutes of Exercise per Session:   Stress:   . Feeling of Stress :   Social Connections:   . Frequency of Communication with Friends and Family:   . Frequency of Social Gatherings with Friends and Family:   . Attends Religious Services:   . Active Member of Clubs or Organizations:   . Attends Archivist Meetings:   Marland Kitchen Marital Status:     No Known Allergies   Current Outpatient Medications:  .  Darunavir-Cobicisctat-Emtricitabine-Tenofovir Alafenamide (SYMTUZA) 800-150-200-10 MG TABS, Take 1 tablet by mouth daily with breakfast., Disp: 30 tablet, Rfl: 5 .  fexofenadine (ALLEGRA) 180 MG tablet,  Take 180 mg by mouth daily., Disp: , Rfl:     Review of Systems  Constitutional: Negative for activity change, appetite change, chills, diaphoresis, fatigue, fever and unexpected weight change.  HENT: Negative for congestion, rhinorrhea, sinus pressure, sneezing, sore throat and trouble swallowing.   Eyes: Negative for photophobia and visual disturbance.  Respiratory: Negative for cough, chest tightness, shortness of breath, wheezing and stridor.   Cardiovascular: Negative for chest pain, palpitations and leg swelling.  Gastrointestinal: Negative for abdominal distention, abdominal pain, anal bleeding, blood in stool, constipation, diarrhea, nausea and vomiting.  Genitourinary: Negative for difficulty urinating, dysuria, flank pain and hematuria.  Musculoskeletal: Negative for arthralgias, back pain, gait problem, joint swelling and myalgias.  Skin: Negative for color change, pallor, rash and wound.  Neurological: Negative for dizziness, tremors, weakness and light-headedness.  Hematological: Negative for adenopathy. Does not bruise/bleed easily.  Psychiatric/Behavioral: Negative for agitation, behavioral problems, confusion, decreased concentration, dysphoric mood and sleep disturbance.       Objective:   Physical Exam Constitutional:      Appearance: He is well-developed.  HENT:     Head: Normocephalic and atraumatic.  Eyes:     Conjunctiva/sclera: Conjunctivae normal.  Cardiovascular:     Rate and Rhythm: Normal rate and regular rhythm.  Pulmonary:     Effort: Pulmonary effort is normal. No respiratory distress.     Breath sounds: No wheezing.  Abdominal:     General: There is no distension.     Palpations: Abdomen is soft.  Musculoskeletal:        General: No tenderness. Normal range of motion.     Cervical back: Normal range of motion and neck supple.  Skin:    General: Skin is warm and dry.     Coloration: Skin is not pale.     Findings: No erythema or rash.    Neurological:     General: No focal deficit present.     Mental Status: He is alert and oriented to person, place, and time.  Psychiatric:        Attention and Perception: Attention normal.        Mood and Affect: Mood normal.        Speech: Speech is delayed.        Behavior: Behavior is slowed. Behavior is cooperative.        Thought Content: Thought content normal.        Cognition and Memory: Memory normal.    Rash on legs partly from syphilis 11/02/2019:  Assessment & Plan:   HIV disease with poor adherence continue in our study he has now nearly 3 months worth of Symtuza he has keychain's of which she was wearing to I gave him a third today he has a pillbox.  We will ship meds here to the clinic to deliver to him when he has visits.  Hopefully can suppress and have a chance to be randomized to long-acting therapy.  Syphilis: Status post recent treatment we will watch titers may be too early to see them declined significantly  For Covid vaccination strongly recommend that he get COVID-19 vaccine and provided him information how to do so.

## 2019-11-02 NOTE — Addendum Note (Signed)
Addended by: Clista Bernhardt on: 11/02/2019 01:46 PM   Modules accepted: Orders

## 2019-11-03 LAB — URINE CYTOLOGY ANCILLARY ONLY
Chlamydia: NEGATIVE
Comment: NEGATIVE
Comment: NORMAL
Neisseria Gonorrhea: NEGATIVE

## 2019-11-03 LAB — T-HELPER CELL (CD4) - (RCID CLINIC ONLY)
CD4 % Helper T Cell: 22 % — ABNORMAL LOW (ref 33–65)
CD4 T Cell Abs: 271 /uL — ABNORMAL LOW (ref 400–1790)

## 2019-11-04 LAB — COMPREHENSIVE METABOLIC PANEL
AG Ratio: 1.2 (calc) (ref 1.0–2.5)
ALT: 22 U/L (ref 9–46)
AST: 34 U/L (ref 10–40)
Albumin: 4.6 g/dL (ref 3.6–5.1)
Alkaline phosphatase (APISO): 96 U/L (ref 36–130)
BUN: 9 mg/dL (ref 7–25)
CO2: 25 mmol/L (ref 20–32)
Calcium: 9.5 mg/dL (ref 8.6–10.3)
Chloride: 104 mmol/L (ref 98–110)
Creat: 0.96 mg/dL (ref 0.60–1.35)
Globulin: 4 g/dL (calc) — ABNORMAL HIGH (ref 1.9–3.7)
Glucose, Bld: 76 mg/dL (ref 65–99)
Potassium: 3.7 mmol/L (ref 3.5–5.3)
Sodium: 139 mmol/L (ref 135–146)
Total Bilirubin: 0.6 mg/dL (ref 0.2–1.2)
Total Protein: 8.6 g/dL — ABNORMAL HIGH (ref 6.1–8.1)

## 2019-11-04 LAB — CBC WITH DIFFERENTIAL/PLATELET
Absolute Monocytes: 378 cells/uL (ref 200–950)
Basophils Absolute: 39 cells/uL (ref 0–200)
Basophils Relative: 1 %
Eosinophils Absolute: 437 cells/uL (ref 15–500)
Eosinophils Relative: 11.2 %
HCT: 47.7 % (ref 38.5–50.0)
Hemoglobin: 15.8 g/dL (ref 13.2–17.1)
Lymphs Abs: 1303 cells/uL (ref 850–3900)
MCH: 28.8 pg (ref 27.0–33.0)
MCHC: 33.1 g/dL (ref 32.0–36.0)
MCV: 86.9 fL (ref 80.0–100.0)
MPV: 11.9 fL (ref 7.5–12.5)
Monocytes Relative: 9.7 %
Neutro Abs: 1743 cells/uL (ref 1500–7800)
Neutrophils Relative %: 44.7 %
Platelets: 196 10*3/uL (ref 140–400)
RBC: 5.49 10*6/uL (ref 4.20–5.80)
RDW: 15 % (ref 11.0–15.0)
Total Lymphocyte: 33.4 %
WBC: 3.9 10*3/uL (ref 3.8–10.8)

## 2019-11-04 LAB — FLUORESCENT TREPONEMAL AB(FTA)-IGG-BLD: Fluorescent Treponemal ABS: REACTIVE — AB

## 2019-11-04 LAB — LIPID PANEL
Cholesterol: 167 mg/dL (ref <200)
HDL: 34 mg/dL — ABNORMAL LOW (ref >=40)
LDL Cholesterol (Calc): 114 mg/dL (calc) — ABNORMAL HIGH
Non-HDL Cholesterol (Calc): 133 mg/dL (calc) — ABNORMAL HIGH (ref <130)
Total CHOL/HDL Ratio: 4.9 (calc) (ref <5.0)
Triglycerides: 90 mg/dL (ref <150)

## 2019-11-04 LAB — RPR TITER: RPR Titer: 1:8 {titer} — ABNORMAL HIGH

## 2019-11-04 LAB — HIV-1 RNA QUANT-NO REFLEX-BLD
HIV 1 RNA Quant: 14600 copies/mL — ABNORMAL HIGH
HIV-1 RNA Quant, Log: 4.16 Log copies/mL — ABNORMAL HIGH

## 2019-11-04 LAB — BILIRUBIN, DIRECT: Bilirubin, Direct: 0.1 mg/dL (ref 0.0–0.2)

## 2019-11-04 LAB — RPR: RPR Ser Ql: REACTIVE — AB

## 2019-11-11 ENCOUNTER — Encounter: Payer: Self-pay | Admitting: Infectious Disease

## 2019-11-11 LAB — CD4/CD8 (T-HELPER/T-SUPPRESSOR CELL)
CD4 % Helper T Cell: 23
CD4 Count: 276
CD8 % Suppressor T Cell: 41.2
CD8 T Cell Abs: 494

## 2019-11-22 ENCOUNTER — Telehealth: Payer: Self-pay | Admitting: Pharmacy Technician

## 2019-11-22 NOTE — Telephone Encounter (Addendum)
[facility] Patient Advocate Encounter  Patient's medication has been couriered to [facility] from [facility] and were picked up today, 11/24/2019.  Netty Starring. Dimas Aguas CPhT [facility] Patient [facility] Phone: [phone] Fax:  [phone]

## 2019-11-24 ENCOUNTER — Other Ambulatory Visit: Payer: Self-pay

## 2019-11-24 ENCOUNTER — Encounter (INDEPENDENT_AMBULATORY_CARE_PROVIDER_SITE_OTHER): Payer: Self-pay | Admitting: *Deleted

## 2019-11-24 VITALS — BP 128/87 | HR 91 | Temp 98.5°F | Wt 199.2 lb

## 2019-11-24 DIAGNOSIS — Z006 Encounter for examination for normal comparison and control in clinical research program: Secondary | ICD-10-CM

## 2019-11-24 NOTE — Research (Signed)
Micheal Leonard was here today for his week 16 visit for A5359. He forgot to bring his previous pill bottles in with him today for pill counts, so it's hard to tell how well he has done with adherence. He said he had missed a few this week, due to schedules changing. He is going to [place] next week for 10 days and says he is going to start packing early and making sure he has a bottle of pills in his suitcase before he leaves. He denies any new problems and has started taking allegra for allergy sx. He will be returning in 4 weeks for the next visit.

## 2019-11-26 LAB — HIV-1 RNA QUANT-NO REFLEX-BLD
HIV 1 RNA Quant: 302 copies/mL — ABNORMAL HIGH
HIV-1 RNA Quant, Log: 2.48 Log copies/mL — ABNORMAL HIGH

## 2019-12-20 ENCOUNTER — Encounter: Payer: 59 | Admitting: *Deleted

## 2019-12-22 ENCOUNTER — Encounter: Payer: 59 | Admitting: *Deleted

## 2019-12-24 ENCOUNTER — Telehealth: Payer: Self-pay | Admitting: Pharmacy Technician

## 2019-12-24 NOTE — Telephone Encounter (Signed)
[facility] Patient Advocate Encounter  Patient's medication arrived and is stored in the pharmacy cabinet for his 06/14 appointment.

## 2019-12-27 ENCOUNTER — Other Ambulatory Visit: Payer: Self-pay

## 2019-12-27 ENCOUNTER — Encounter (INDEPENDENT_AMBULATORY_CARE_PROVIDER_SITE_OTHER): Payer: Self-pay | Admitting: *Deleted

## 2019-12-27 VITALS — BP 124/83 | HR 80 | Temp 98.3°F | Wt 204.0 lb

## 2019-12-27 DIAGNOSIS — Z006 Encounter for examination for normal comparison and control in clinical research program: Secondary | ICD-10-CM

## 2019-12-27 NOTE — Research (Signed)
Micheal Leonard was here today for his week 20 visit for A5359. He did bring his pills back today but it was difficult to track adherence because he didn't bring them back last visit and he brought an old bottle of pills back from before he was on study. He said he did well since he returned from [place] at then end of May. We reviewed adherence guidelines. This was the visit where his VL needs to be under 50 to continue on study. In any case he does have the next appt. Scheduled for 3 weeks from now which may be his last visit.He denied any new problems or medications.

## 2019-12-29 LAB — CBC WITH DIFFERENTIAL/PLATELET
Absolute Monocytes: 385 cells/uL (ref 200–950)
Basophils Absolute: 41 cells/uL (ref 0–200)
Basophils Relative: 1.1 %
Eosinophils Absolute: 233 cells/uL (ref 15–500)
Eosinophils Relative: 6.3 %
HCT: 45.9 % (ref 38.5–50.0)
Hemoglobin: 15.7 g/dL (ref 13.2–17.1)
Lymphs Abs: 1399 cells/uL (ref 850–3900)
MCH: 30.2 pg (ref 27.0–33.0)
MCHC: 34.2 g/dL (ref 32.0–36.0)
MCV: 88.3 fL (ref 80.0–100.0)
MPV: 12.1 fL (ref 7.5–12.5)
Monocytes Relative: 10.4 %
Neutro Abs: 1643 cells/uL (ref 1500–7800)
Neutrophils Relative %: 44.4 %
Platelets: 204 10*3/uL (ref 140–400)
RBC: 5.2 10*6/uL (ref 4.20–5.80)
RDW: 13.9 % (ref 11.0–15.0)
Total Lymphocyte: 37.8 %
WBC: 3.7 10*3/uL — ABNORMAL LOW (ref 3.8–10.8)

## 2019-12-29 LAB — COMPREHENSIVE METABOLIC PANEL
AG Ratio: 1.3 (calc) (ref 1.0–2.5)
ALT: 18 U/L (ref 9–46)
AST: 25 U/L (ref 10–40)
Albumin: 4.8 g/dL (ref 3.6–5.1)
Alkaline phosphatase (APISO): 75 U/L (ref 36–130)
BUN: 11 mg/dL (ref 7–25)
CO2: 26 mmol/L (ref 20–32)
Calcium: 9.6 mg/dL (ref 8.6–10.3)
Chloride: 101 mmol/L (ref 98–110)
Creat: 0.93 mg/dL (ref 0.60–1.35)
Globulin: 3.8 g/dL (calc) — ABNORMAL HIGH (ref 1.9–3.7)
Glucose, Bld: 97 mg/dL (ref 65–99)
Potassium: 4.2 mmol/L (ref 3.5–5.3)
Sodium: 136 mmol/L (ref 135–146)
Total Bilirubin: 0.6 mg/dL (ref 0.2–1.2)
Total Protein: 8.6 g/dL — ABNORMAL HIGH (ref 6.1–8.1)

## 2019-12-29 LAB — BILIRUBIN, DIRECT: Bilirubin, Direct: 0.1 mg/dL (ref 0.0–0.2)

## 2019-12-29 LAB — HIV-1 RNA QUANT-NO REFLEX-BLD
HIV 1 RNA Quant: 322 copies/mL — ABNORMAL HIGH
HIV-1 RNA Quant, Log: 2.51 Log copies/mL — ABNORMAL HIGH

## 2020-01-19 ENCOUNTER — Encounter: Payer: 59 | Admitting: *Deleted

## 2020-01-21 ENCOUNTER — Encounter (INDEPENDENT_AMBULATORY_CARE_PROVIDER_SITE_OTHER): Payer: Self-pay | Admitting: *Deleted

## 2020-01-21 ENCOUNTER — Other Ambulatory Visit: Payer: Self-pay

## 2020-01-21 VITALS — BP 138/76 | HR 80 | Temp 98.8°F | Wt 204.1 lb

## 2020-01-21 DIAGNOSIS — Z006 Encounter for examination for normal comparison and control in clinical research program: Secondary | ICD-10-CM

## 2020-01-21 NOTE — Research (Signed)
[name] was here for his week 24 and final visit for the A5359 [place] study. He was not able to get completely suppressed on hsi oral ARVs and was nopt able to move into the injectable phase of the study. He denies any new problems or concerns. I did tell him that the injectables will soon be available and he can discuss that with Dr. [name] to see about starting treatment that way. He is interested in screening for the Hep B vaccine study. I gave him the consent to read over and let his Mother review and have him scheduled for Tuesday.

## 2020-01-22 LAB — LIPID PANEL
Cholesterol: 189 mg/dL (ref <200)
HDL: 42 mg/dL (ref >=40)
LDL Cholesterol (Calc): 126 mg/dL (calc) — ABNORMAL HIGH
Non-HDL Cholesterol (Calc): 147 mg/dL (calc) — ABNORMAL HIGH (ref <130)
Total CHOL/HDL Ratio: 4.5 (calc) (ref <5.0)
Triglycerides: 106 mg/dL (ref <150)

## 2020-01-22 LAB — COMPREHENSIVE METABOLIC PANEL
AG Ratio: 1.4 (calc) (ref 1.0–2.5)
ALT: 19 U/L (ref 9–46)
AST: 25 U/L (ref 10–40)
Albumin: 4.8 g/dL (ref 3.6–5.1)
Alkaline phosphatase (APISO): 64 U/L (ref 36–130)
BUN: 12 mg/dL (ref 7–25)
CO2: 25 mmol/L (ref 20–32)
Calcium: 9.7 mg/dL (ref 8.6–10.3)
Chloride: 105 mmol/L (ref 98–110)
Creat: 1.03 mg/dL (ref 0.60–1.35)
Globulin: 3.4 g/dL (calc) (ref 1.9–3.7)
Glucose, Bld: 101 mg/dL — ABNORMAL HIGH (ref 65–99)
Potassium: 4.2 mmol/L (ref 3.5–5.3)
Sodium: 140 mmol/L (ref 135–146)
Total Bilirubin: 0.8 mg/dL (ref 0.2–1.2)
Total Protein: 8.2 g/dL — ABNORMAL HIGH (ref 6.1–8.1)

## 2020-01-22 LAB — BILIRUBIN, DIRECT: Bilirubin, Direct: 0.2 mg/dL (ref 0.0–0.2)

## 2020-01-25 ENCOUNTER — Other Ambulatory Visit: Payer: Self-pay

## 2020-01-25 ENCOUNTER — Encounter (INDEPENDENT_AMBULATORY_CARE_PROVIDER_SITE_OTHER): Payer: Self-pay | Admitting: *Deleted

## 2020-01-25 VITALS — BP 127/82 | HR 82 | Temp 98.1°F | Ht 72.0 in | Wt 207.0 lb

## 2020-01-25 DIAGNOSIS — Z006 Encounter for examination for normal comparison and control in clinical research program: Secondary | ICD-10-CM

## 2020-01-25 NOTE — Research (Signed)
Micheal Leonard was here to screen for the A5379 study, [employer]. Informed consent was obtained after he had time to review it at home with his Mother. We reviewed it again today and answered all his questions. He was able to verbalize the purpose of the study and his role on it.  He denies any new problems and will require a PE before entry. He has one scheduled with [name] on 8/10 and we will plan entry after that date. He was vaccinated as an infant, and there are no positive antibody results available. His last Hep B antibody test was negative in 2020.

## 2020-01-26 LAB — COMPREHENSIVE METABOLIC PANEL
AG Ratio: 1.3 (calc) (ref 1.0–2.5)
ALT: 21 U/L (ref 9–46)
AST: 22 U/L (ref 10–40)
Albumin: 4.8 g/dL (ref 3.6–5.1)
Alkaline phosphatase (APISO): 71 U/L (ref 36–130)
BUN: 17 mg/dL (ref 7–25)
CO2: 23 mmol/L (ref 20–32)
Calcium: 9.7 mg/dL (ref 8.6–10.3)
Chloride: 107 mmol/L (ref 98–110)
Creat: 0.94 mg/dL (ref 0.60–1.35)
Globulin: 3.7 g/dL (calc) (ref 1.9–3.7)
Glucose, Bld: 91 mg/dL (ref 65–99)
Potassium: 4.3 mmol/L (ref 3.5–5.3)
Sodium: 137 mmol/L (ref 135–146)
Total Bilirubin: 0.5 mg/dL (ref 0.2–1.2)
Total Protein: 8.5 g/dL — ABNORMAL HIGH (ref 6.1–8.1)

## 2020-01-26 LAB — CBC WITH DIFFERENTIAL/PLATELET
Absolute Monocytes: 410 cells/uL (ref 200–950)
Basophils Absolute: 19 cells/uL (ref 0–200)
Basophils Relative: 0.6 %
Eosinophils Absolute: 211 cells/uL (ref 15–500)
Eosinophils Relative: 6.6 %
HCT: 47.3 % (ref 38.5–50.0)
Hemoglobin: 16 g/dL (ref 13.2–17.1)
Lymphs Abs: 1171 cells/uL (ref 850–3900)
MCH: 30.5 pg (ref 27.0–33.0)
MCHC: 33.8 g/dL (ref 32.0–36.0)
MCV: 90.1 fL (ref 80.0–100.0)
MPV: 12.6 fL — ABNORMAL HIGH (ref 7.5–12.5)
Monocytes Relative: 12.8 %
Neutro Abs: 1389 cells/uL — ABNORMAL LOW (ref 1500–7800)
Neutrophils Relative %: 43.4 %
Platelets: 219 10*3/uL (ref 140–400)
RBC: 5.25 10*6/uL (ref 4.20–5.80)
RDW: 13 % (ref 11.0–15.0)
Total Lymphocyte: 36.6 %
WBC: 3.2 10*3/uL — ABNORMAL LOW (ref 3.8–10.8)

## 2020-01-26 LAB — HEPATITIS B SURFACE ANTIGEN: Hepatitis B Surface Ag: NONREACTIVE

## 2020-01-26 LAB — HEPATITIS B CORE ANTIBODY, TOTAL: Hep B Core Total Ab: NONREACTIVE

## 2020-01-26 LAB — BILIRUBIN, DIRECT: Bilirubin, Direct: 0.1 mg/dL (ref 0.0–0.2)

## 2020-01-26 LAB — HEMOGLOBIN A1C
Hgb A1c MFr Bld: 5.6 % of total Hgb (ref <5.7)
Mean Plasma Glucose: 114 (calc)
eAG (mmol/L): 6.3 (calc)

## 2020-01-26 LAB — CK: Total CK: 363 U/L — ABNORMAL HIGH (ref 44–196)

## 2020-01-26 LAB — HEPATITIS B SURFACE ANTIBODY,QUALITATIVE: Hep B S Ab: NONREACTIVE

## 2020-01-26 LAB — PROTIME-INR
INR: 1.1
Prothrombin Time: 11.9 s — ABNORMAL HIGH (ref 9.0–11.5)

## 2020-01-26 LAB — PHOSPHORUS: Phosphorus: 3.9 mg/dL (ref 2.5–4.5)

## 2020-02-10 MED FILL — SYMTUZA 800-150-200-10 MG T: 800-150-200 | 30 days supply | Qty: 30 | Fill #3

## 2020-02-11 ENCOUNTER — Encounter: Payer: Self-pay | Admitting: Infectious Disease

## 2020-02-11 LAB — CD4/CD8 (T-HELPER/T-SUPPRESSOR CELL)
CD4 % Helper T Cell: 25.3
CD4 Count: 304
CD8 % Suppressor T Cell: 32.8
CD8 T Cell Abs: 394
HIV 1 RNA UltraQuant: 79

## 2020-02-22 ENCOUNTER — Other Ambulatory Visit: Payer: Self-pay

## 2020-02-22 ENCOUNTER — Ambulatory Visit (INDEPENDENT_AMBULATORY_CARE_PROVIDER_SITE_OTHER): Payer: Self-pay | Admitting: *Deleted

## 2020-02-22 VITALS — BP 147/89 | HR 92 | Temp 98.7°F | Wt 210.1 lb

## 2020-02-22 DIAGNOSIS — Z006 Encounter for examination for normal comparison and control in clinical research program: Secondary | ICD-10-CM

## 2020-02-22 NOTE — Progress Notes (Signed)
Micheal Leonard was here for a PE with [employer] [facility], documented in the source.

## 2020-02-28 ENCOUNTER — Encounter: Payer: 59 | Admitting: *Deleted

## 2020-02-29 ENCOUNTER — Encounter (INDEPENDENT_AMBULATORY_CARE_PROVIDER_SITE_OTHER): Payer: Self-pay | Admitting: *Deleted

## 2020-02-29 ENCOUNTER — Encounter: Payer: Self-pay | Admitting: Infectious Disease

## 2020-02-29 ENCOUNTER — Other Ambulatory Visit: Payer: Self-pay

## 2020-02-29 VITALS — BP 144/88 | HR 80 | Temp 98.2°F | Wt 212.1 lb

## 2020-02-29 DIAGNOSIS — Z006 Encounter for examination for normal comparison and control in clinical research program: Secondary | ICD-10-CM

## 2020-02-29 NOTE — Research (Signed)
Micheal Leonard was randomized today on the BeeHive study after eligibility was verified. He was put on the Engerix vaccine 3 doses arm. He was given the first injection in his rt. Deltoid without any problem. He denied any side effects afterwards and was instructed on obtaining his temperature everyday and keeping up with the diary. He will be returning on 03/24/20 for the next study visit.

## 2020-03-01 LAB — COMPREHENSIVE METABOLIC PANEL
AG Ratio: 1.3 (calc) (ref 1.0–2.5)
ALT: 22 U/L (ref 9–46)
AST: 25 U/L (ref 10–40)
Albumin: 4.7 g/dL (ref 3.6–5.1)
Alkaline phosphatase (APISO): 79 U/L (ref 36–130)
BUN: 11 mg/dL (ref 7–25)
CO2: 25 mmol/L (ref 20–32)
Calcium: 9.6 mg/dL (ref 8.6–10.3)
Chloride: 104 mmol/L (ref 98–110)
Creat: 1 mg/dL (ref 0.60–1.35)
Globulin: 3.6 g/dL (calc) (ref 1.9–3.7)
Glucose, Bld: 103 mg/dL — ABNORMAL HIGH (ref 65–99)
Potassium: 4.4 mmol/L (ref 3.5–5.3)
Sodium: 138 mmol/L (ref 135–146)
Total Bilirubin: 0.5 mg/dL (ref 0.2–1.2)
Total Protein: 8.3 g/dL — ABNORMAL HIGH (ref 6.1–8.1)

## 2020-03-01 LAB — HEPATIC FUNCTION PANEL
AG Ratio: 1.3 (calc) (ref 1.0–2.5)
ALT: 22 U/L (ref 9–46)
AST: 25 U/L (ref 10–40)
Albumin: 4.7 g/dL (ref 3.6–5.1)
Alkaline phosphatase (APISO): 79 U/L (ref 36–130)
Bilirubin, Direct: 0.1 mg/dL (ref 0.0–0.2)
Globulin: 3.6 g/dL (calc) (ref 1.9–3.7)
Indirect Bilirubin: 0.4 mg/dL (calc) (ref 0.2–1.2)
Total Bilirubin: 0.5 mg/dL (ref 0.2–1.2)
Total Protein: 8.3 g/dL — ABNORMAL HIGH (ref 6.1–8.1)

## 2020-03-01 LAB — PHOSPHORUS: Phosphorus: 3.3 mg/dL (ref 2.5–4.5)

## 2020-03-01 LAB — CK: Total CK: 571 U/L — ABNORMAL HIGH (ref 44–196)

## 2020-03-09 MED FILL — SYMTUZA 800-150-200-10 MG T: 800-150-200 | 30 days supply | Qty: 30 | Fill #4

## 2020-03-24 ENCOUNTER — Encounter: Payer: 59 | Admitting: *Deleted

## 2020-03-29 ENCOUNTER — Other Ambulatory Visit: Payer: Self-pay

## 2020-03-29 ENCOUNTER — Encounter (INDEPENDENT_AMBULATORY_CARE_PROVIDER_SITE_OTHER): Payer: Self-pay | Admitting: *Deleted

## 2020-03-29 VITALS — BP 143/81 | HR 90 | Temp 98.3°F | Wt 216.0 lb

## 2020-03-29 DIAGNOSIS — Z006 Encounter for examination for normal comparison and control in clinical research program: Secondary | ICD-10-CM

## 2020-03-29 NOTE — Research (Signed)
Micheal Leonard was here for his week 4 visit for the BeeHive study. He received his second dose of Engerix today in his rt deltoid. He denied any post vaccination sx. He was given a diary to take home and document daily temps for a week and to document any other side effects or med. Issues/meds. He forgot to bring his diary back form last visit, though he did say he filled it out. He said he remembered having a headache on day 5 after the last injection, but other than that has not had any problems. His Mom was with him at this visit and we did discuss his adherence issues with his ARVS and maybe getting him started on injectable therapies when we start them in the clinic.  He will be returning in 4 weeks.

## 2020-03-30 LAB — CBC WITH DIFFERENTIAL/PLATELET
Absolute Monocytes: 400 cells/uL (ref 200–950)
Basophils Absolute: 40 cells/uL (ref 0–200)
Basophils Relative: 1.3 %
Eosinophils Absolute: 229 cells/uL (ref 15–500)
Eosinophils Relative: 7.4 %
HCT: 46.3 % (ref 38.5–50.0)
Hemoglobin: 15.3 g/dL (ref 13.2–17.1)
Lymphs Abs: 1026 cells/uL (ref 850–3900)
MCH: 29.2 pg (ref 27.0–33.0)
MCHC: 33 g/dL (ref 32.0–36.0)
MCV: 88.4 fL (ref 80.0–100.0)
MPV: 12.4 fL (ref 7.5–12.5)
Monocytes Relative: 12.9 %
Neutro Abs: 1404 cells/uL — ABNORMAL LOW (ref 1500–7800)
Neutrophils Relative %: 45.3 %
Platelets: 195 10*3/uL (ref 140–400)
RBC: 5.24 10*6/uL (ref 4.20–5.80)
RDW: 13.2 % (ref 11.0–15.0)
Total Lymphocyte: 33.1 %
WBC: 3.1 10*3/uL — ABNORMAL LOW (ref 3.8–10.8)

## 2020-03-30 LAB — HEPATIC FUNCTION PANEL
AG Ratio: 1.3 (calc) (ref 1.0–2.5)
ALT: 25 U/L (ref 9–46)
AST: 36 U/L (ref 10–40)
Albumin: 4.7 g/dL (ref 3.6–5.1)
Alkaline phosphatase (APISO): 79 U/L (ref 36–130)
Bilirubin, Direct: 0.1 mg/dL (ref 0.0–0.2)
Globulin: 3.5 g/dL (calc) (ref 1.9–3.7)
Indirect Bilirubin: 0.5 mg/dL (calc) (ref 0.2–1.2)
Total Bilirubin: 0.6 mg/dL (ref 0.2–1.2)
Total Protein: 8.2 g/dL — ABNORMAL HIGH (ref 6.1–8.1)

## 2020-04-05 ENCOUNTER — Encounter: Payer: Self-pay | Admitting: Infectious Disease

## 2020-04-05 LAB — CD4/CD8 (T-HELPER/T-SUPPRESSOR CELL)
CD4 % Helper T Cell: 27.5
CD4 Count: 275
CD8 % Suppressor T Cell: 35.3
CD8 T Cell Abs: 353
HIV 1 RNA UltraQuant: <40

## 2020-04-06 MED FILL — SYMTUZA 800-150-200-10 MG T: 800-150-200 | 30 days supply | Qty: 30 | Fill #5

## 2020-04-28 ENCOUNTER — Ambulatory Visit (INDEPENDENT_AMBULATORY_CARE_PROVIDER_SITE_OTHER): Payer: 59

## 2020-04-28 ENCOUNTER — Other Ambulatory Visit: Payer: Self-pay | Admitting: Infectious Disease

## 2020-04-28 ENCOUNTER — Encounter (INDEPENDENT_AMBULATORY_CARE_PROVIDER_SITE_OTHER): Payer: Self-pay | Admitting: *Deleted

## 2020-04-28 ENCOUNTER — Other Ambulatory Visit: Payer: Self-pay

## 2020-04-28 VITALS — BP 141/85 | HR 76 | Temp 98.4°F | Wt 213.4 lb

## 2020-04-28 DIAGNOSIS — Z23 Encounter for immunization: Secondary | ICD-10-CM

## 2020-04-28 DIAGNOSIS — Z006 Encounter for examination for normal comparison and control in clinical research program: Secondary | ICD-10-CM

## 2020-04-28 NOTE — Progress Notes (Signed)
   Covid-19 Vaccination Clinic  Name:  Micheal Leonard    MRN: [id number] DOB: March 29, 1996  04/28/2020  Mr. [name] was observed post Covid-19 immunization for 15 minutes without incident. He was provided with Vaccine Information Sheet and instruction to access the V-Safe system.   Mr. [name] was instructed to call 911 with any severe reactions post vaccine: Marland Kitchen Difficulty breathing  . Swelling of face and throat  . A fast heartbeat  . A bad rash all over body  . Dizziness and weakness   Immunizations Administered    Name Date Dose VIS Date Route   Pfizer COVID-19 Vaccine 04/28/2020  9:54 AM 0.3 mL 09/08/2018 Intramuscular   Manufacturer: [employer], [employer]   Lot: [id number]   NDC: [address]-5     Andree Coss, RN

## 2020-04-28 NOTE — Research (Signed)
Micheal Leonard was here for his week 8 study vsit for the BeeHive study. He did bring both diaries back today. He denies any current problems and is going to get his first covid vaccine today. He will be back in 4 weeks

## 2020-04-29 LAB — CBC WITH DIFFERENTIAL/PLATELET
Absolute Monocytes: 441 cells/uL (ref 200–950)
Basophils Absolute: 42 cells/uL (ref 0–200)
Basophils Relative: 1.1 %
Eosinophils Absolute: 281 cells/uL (ref 15–500)
Eosinophils Relative: 7.4 %
HCT: 47.6 % (ref 38.5–50.0)
Hemoglobin: 16 g/dL (ref 13.2–17.1)
Lymphs Abs: 1600 cells/uL (ref 850–3900)
MCH: 29.5 pg (ref 27.0–33.0)
MCHC: 33.6 g/dL (ref 32.0–36.0)
MCV: 87.8 fL (ref 80.0–100.0)
MPV: 12.2 fL (ref 7.5–12.5)
Monocytes Relative: 11.6 %
Neutro Abs: 1436 cells/uL — ABNORMAL LOW (ref 1500–7800)
Neutrophils Relative %: 37.8 %
Platelets: 196 10*3/uL (ref 140–400)
RBC: 5.42 10*6/uL (ref 4.20–5.80)
RDW: 13.1 % (ref 11.0–15.0)
Total Lymphocyte: 42.1 %
WBC: 3.8 10*3/uL (ref 3.8–10.8)

## 2020-04-29 LAB — HEPATIC FUNCTION PANEL
AG Ratio: 1.1 (calc) (ref 1.0–2.5)
ALT: 29 U/L (ref 9–46)
AST: 29 U/L (ref 10–40)
Albumin: 4.6 g/dL (ref 3.6–5.1)
Alkaline phosphatase (APISO): 80 U/L (ref 36–130)
Bilirubin, Direct: 0.2 mg/dL (ref 0.0–0.2)
Globulin: 4.1 g/dL (calc) — ABNORMAL HIGH (ref 1.9–3.7)
Indirect Bilirubin: 0.4 mg/dL (calc) (ref 0.2–1.2)
Total Bilirubin: 0.6 mg/dL (ref 0.2–1.2)
Total Protein: 8.7 g/dL — ABNORMAL HIGH (ref 6.1–8.1)

## 2020-05-03 MED FILL — SYMTUZA 800-150-200-10 MG T: 800-150-200 | 30 days supply | Qty: 30 | Fill #0

## 2020-05-19 ENCOUNTER — Ambulatory Visit: Payer: 59

## 2020-05-24 ENCOUNTER — Encounter: Payer: 59 | Admitting: *Deleted

## 2020-05-26 MED FILL — SYMTUZA 800-150-200-10 MG T: 800-150-200 | 30 days supply | Qty: 30 | Fill #1

## 2020-05-29 ENCOUNTER — Ambulatory Visit: Payer: 59 | Admitting: Infectious Disease

## 2020-05-30 ENCOUNTER — Other Ambulatory Visit: Payer: Self-pay

## 2020-05-30 ENCOUNTER — Encounter: Payer: Self-pay | Admitting: Infectious Disease

## 2020-05-30 ENCOUNTER — Ambulatory Visit (INDEPENDENT_AMBULATORY_CARE_PROVIDER_SITE_OTHER): Payer: 59 | Admitting: Infectious Disease

## 2020-05-30 ENCOUNTER — Other Ambulatory Visit: Payer: 59

## 2020-05-30 ENCOUNTER — Encounter (INDEPENDENT_AMBULATORY_CARE_PROVIDER_SITE_OTHER): Payer: Self-pay | Admitting: *Deleted

## 2020-05-30 VITALS — BP 145/92 | HR 112 | Temp 101.3°F | Wt 221.4 lb

## 2020-05-30 DIAGNOSIS — A564 Chlamydial infection of pharynx: Secondary | ICD-10-CM

## 2020-05-30 DIAGNOSIS — J029 Acute pharyngitis, unspecified: Secondary | ICD-10-CM

## 2020-05-30 DIAGNOSIS — Z20822 Contact with and (suspected) exposure to covid-19: Secondary | ICD-10-CM

## 2020-05-30 DIAGNOSIS — A539 Syphilis, unspecified: Secondary | ICD-10-CM | POA: Diagnosis not present

## 2020-05-30 DIAGNOSIS — A549 Gonococcal infection, unspecified: Secondary | ICD-10-CM

## 2020-05-30 DIAGNOSIS — R42 Dizziness and giddiness: Secondary | ICD-10-CM

## 2020-05-30 DIAGNOSIS — B20 Human immunodeficiency virus [HIV] disease: Secondary | ICD-10-CM

## 2020-05-30 DIAGNOSIS — R509 Fever, unspecified: Secondary | ICD-10-CM

## 2020-05-30 DIAGNOSIS — N529 Male erectile dysfunction, unspecified: Secondary | ICD-10-CM

## 2020-05-30 DIAGNOSIS — Z006 Encounter for examination for normal comparison and control in clinical research program: Secondary | ICD-10-CM

## 2020-05-30 HISTORY — DX: Dizziness and giddiness: R42

## 2020-05-30 HISTORY — DX: Male erectile dysfunction, unspecified: N52.9

## 2020-05-30 NOTE — Research (Signed)
Micheal Leonard came in today for his week 12 visit for the BeeHive study. He was very sleepy and said he could n't sleep last night. He wanted to talk with Dr. [name] about something "private" and he had missed his appt. with him yesterday, He saw Dr. [name] and told him he was concerned about ED.  After he came in our office and I got VS on him, his temp was 101.3  And when asked about other sx. He reported headache and sore throat since yesterday. We sent him over for covid testing right away and told him to take tylenol and follow the testing site's instructions if he is positive for covid. He was vaccinated once about a month ago and needs the second dose now.He is scheduled to see Dr. [name] again next week and research in January.

## 2020-05-30 NOTE — Progress Notes (Signed)
Subjective:  Chief complaint difficulty with ejaculation and dizziness when having sex  Patient ID: Micheal Leonard, male    DOB: 06-Jan-1996, 24 y.o.   MRN: [id number]  HPI   Micheal Leonard is a 24 year old African-American man living with HIV who is enrolled in our LATTITUDE study via [facility]   [name] came in today for research visit but then had a concerned that he wanted to talk to a male provider about.  The concern is that he has having troubles with maintaining an erection when having sex either with another person or if he is masturbating he also is having symptoms of lightheadedness and dizziness when he is either having sex or masturbating.  I do not yet have labs on him to see if his viral load is suppressed.  He is not on medications that should make him lightheaded or dizzy and he does not have the symptoms except when he is having sex or masturbating.  He says this is been going on for roughly 2 months.  We did check orthostatics in the clinic and he was not orthostatic in fact his blood pressure went up.  Also check some endocrine labs on him.  After he had finished his visit he then disclosed to Micheal Leonard that he had been having a sore throat and a temperature up to 101.2.  He has subsequent been sent for COVID-19 testing.  Past Medical History:  Diagnosis Date  . Cardiac anomaly, congenital   . Diarrhea 01/22/2017  . EBV infection    hx/notes 10/23/2016  . Gonorrhea 07/20/2019  . Hemoptysis 10/23/2016   [name] 10/23/2016  . HIV (human immunodeficiency virus infection) (HCC)   . Hypertension   . Migraine    "a few/year now; frequency is less than in the past" (10/23/2016)  . Nonadherence to medical treatment 09/08/2019  . Pharyngitis 10/23/2016   notes 10/23/2016  . Pulmonary embolism (HCC) 01/29/2016  . S/P bidirectional Glenn shunt    hx/notes 10/23/2016  . S/P Fontan procedure    hx/notes 10/23/2016  . Syphilis 07/20/2019  . Tricuspid atresia and stenosis, congenital      Past Surgical History:  Procedure Laterality Date  . CARDIAC CATHETERIZATION  01/2016   at [facility]; hx/notes 10/23/2016  . CARDIAC SURGERY  01/1997; 2000   fontane procedure at [facility] as a child     Family History  Problem Relation Age of Onset  . Hypertension Father       Social History   Socioeconomic History  . Marital status: Significant Other    Spouse name: Not on file  . Number of children: Not on file  . Years of education: Not on file  . Highest education level: Not on file  Occupational History  . Not on file  Tobacco Use  . Smoking status: Never Smoker  . Smokeless tobacco: Never Used  Substance and Sexual Activity  . Alcohol use: Yes    Comment: 2 GLASSES A WEEK  . Drug use: Yes    Frequency: 2.0 times per week    Types: Marijuana  . Sexual activity: Yes    Partners: Male    Birth control/protection: Condom    Comment: given condoms  Other Topics Concern  . Not on file  Social History Narrative  . Not on file   Social Determinants of Health   Financial Resource Strain:   . Difficulty of Paying Living Expenses: Not on file  Food Insecurity:   . Worried About Programme researcher, broadcasting/film/video  in the Last Year: Not on file  . Ran Out of Food in the Last Year: Not on file  Transportation Needs:   . Lack of Transportation (Medical): Not on file  . Lack of Transportation (Non-Medical): Not on file  Physical Activity:   . Days of Exercise per Week: Not on file  . Minutes of Exercise per Session: Not on file  Stress:   . Feeling of Stress : Not on file  Social Connections:   . Frequency of Communication with Friends and Family: Not on file  . Frequency of Social Gatherings with Friends and Family: Not on file  . Attends Religious Services: Not on file  . Active Member of Clubs or Organizations: Not on file  . Attends Banker Meetings: Not on file  . Marital Status: Not on file    No Known Allergies   Current Outpatient Medications:  .   fexofenadine (ALLEGRA) 180 MG tablet, Take 180 mg by mouth daily., Disp: , Rfl:  .  SYMTUZA 800-150-200-10 MG TABS, TAKE 1 TABLET BY MOUTH DAILY WITH BREAKFAST., Disp: 30 tablet, Rfl: 5    Review of Systems  Constitutional: Negative for activity change, appetite change, chills, diaphoresis, fatigue, fever and unexpected weight change.  HENT: Negative for congestion, rhinorrhea, sinus pressure, sneezing, sore throat and trouble swallowing.   Eyes: Negative for photophobia and visual disturbance.  Respiratory: Negative for cough, chest tightness, shortness of breath, wheezing and stridor.   Cardiovascular: Negative for chest pain, palpitations and leg swelling.  Gastrointestinal: Negative for abdominal distention, abdominal pain, anal bleeding, blood in stool, constipation, diarrhea, nausea and vomiting.  Genitourinary: Negative for difficulty urinating, dysuria, flank pain and hematuria.  Musculoskeletal: Negative for arthralgias, back pain, gait problem, joint swelling and myalgias.  Skin: Negative for color change, pallor, rash and wound.  Neurological: Negative for dizziness, tremors, weakness and light-headedness.  Hematological: Negative for adenopathy. Does not bruise/bleed easily.  Psychiatric/Behavioral: Negative for agitation, behavioral problems, confusion, decreased concentration, dysphoric mood and sleep disturbance.       Objective:   Physical Exam Constitutional:      Appearance: He is well-developed.  HENT:     Head: Normocephalic and atraumatic.  Eyes:     Conjunctiva/sclera: Conjunctivae normal.  Cardiovascular:     Rate and Rhythm: Normal rate and regular rhythm.  Pulmonary:     Effort: Pulmonary effort is normal. No respiratory distress.     Breath sounds: No wheezing.  Abdominal:     General: There is no distension.     Palpations: Abdomen is soft.  Musculoskeletal:        General: No tenderness. Normal range of motion.     Cervical back: Normal range of  motion and neck supple.  Skin:    General: Skin is warm and dry.     Coloration: Skin is not pale.     Findings: No erythema or rash.  Neurological:     General: No focal deficit present.     Mental Status: He is alert and oriented to person, place, and time.  Psychiatric:        Attention and Perception: Attention normal.        Mood and Affect: Mood normal.        Speech: Speech is delayed.        Behavior: Behavior is slowed. Behavior is cooperative.        Thought Content: Thought content normal.        Cognition and Memory:  Memory normal.           Assessment & Plan:   HIV disease with poor adherence: Labs being checked today he is still in our study  Erectile dysfunction: Strange constellation of symptoms in particular with the dizziness and lightheadedness.  I will check some endocrine labs today including TSH FSH LH and testosterone.  Fever and sore throat: He is to be referred for COVID-19 testing, can also recheck for GC in OP at future visit if COVID negative  Syphilis: Status post recent treatment we will watch titers may be too early to see them declined significantly

## 2020-05-31 LAB — FSH/LH
FSH: 6.3 m[IU]/mL (ref 1.6–8.0)
LH: 11.3 m[IU]/mL — ABNORMAL HIGH (ref 1.5–9.3)

## 2020-05-31 LAB — SARS-COV-2, NAA 2 DAY TAT

## 2020-05-31 LAB — NOVEL CORONAVIRUS, NAA: SARS-CoV-2, NAA: NOT DETECTED

## 2020-05-31 LAB — TESTOSTERONE: Testosterone: 302 ng/dL (ref 250–827)

## 2020-05-31 LAB — TSH: TSH: 1.25 mIU/L (ref 0.40–4.50)

## 2020-06-05 ENCOUNTER — Ambulatory Visit: Payer: 59 | Admitting: Infectious Disease

## 2020-06-07 ENCOUNTER — Other Ambulatory Visit: Payer: Self-pay

## 2020-06-07 ENCOUNTER — Other Ambulatory Visit (HOSPITAL_COMMUNITY)
Admission: RE | Admit: 2020-06-07 | Discharge: 2020-06-07 | Disposition: A | Discharge status: Home / Self Care | Payer: 59 | Source: Ambulatory Visit | Attending: Infectious Disease | Admitting: Infectious Disease

## 2020-06-07 ENCOUNTER — Ambulatory Visit (INDEPENDENT_AMBULATORY_CARE_PROVIDER_SITE_OTHER): Payer: 59 | Admitting: Infectious Disease

## 2020-06-07 VITALS — BP 144/91 | HR 82 | Temp 98.1°F | Wt 219.0 lb

## 2020-06-07 DIAGNOSIS — B20 Human immunodeficiency virus [HIV] disease: Secondary | ICD-10-CM | POA: Diagnosis not present

## 2020-06-07 DIAGNOSIS — A539 Syphilis, unspecified: Secondary | ICD-10-CM

## 2020-06-07 DIAGNOSIS — Z9889 Other specified postprocedural states: Secondary | ICD-10-CM

## 2020-06-07 DIAGNOSIS — A549 Gonococcal infection, unspecified: Secondary | ICD-10-CM | POA: Diagnosis not present

## 2020-06-07 DIAGNOSIS — R42 Dizziness and giddiness: Secondary | ICD-10-CM

## 2020-06-07 DIAGNOSIS — A564 Chlamydial infection of pharynx: Secondary | ICD-10-CM

## 2020-06-07 LAB — T-HELPER CELL (CD4) - (RCID CLINIC ONLY)
CD4 % Helper T Cell: 27 % — ABNORMAL LOW (ref 33–65)
CD4 T Cell Abs: 223 /uL — ABNORMAL LOW (ref 400–1790)

## 2020-06-07 MED ORDER — SYMTUZA 800-150-200-10 MG PO TABS
1.0000 | ORAL_TABLET | Freq: Every day | ORAL | 11 refills | Status: DC
Start: 2020-06-07 — End: 2020-08-24

## 2020-06-07 NOTE — Progress Notes (Signed)
Subjective:  Chief complaint: Follow-up for HIV disease  Patient ID: Micheal Leonard, male    DOB: 07/17/1995, 24 y.o.   MRN: [id number]  HPI   Micheal Leonard is a 24 year old African-American man living with HIV who is enrolled in our LATTITUDE study via [facility]   Micheal Leonard came in at last visit re symptoms of having  troubles with maintaining an erection when having sex either with another person or if he is masturbating he also is having symptoms of lightheadedness and dizziness when he is either having sex or masturbating.   After he had finished his visit he then disclosed to Deirdre Evener that he had been having a sore throat and a temperature up to 101.2.  He has subsequent been sent for COVID-19 testing was tested negative at the [facility] site.  He says he tested positive for influenza though I do have access to where he was tested positive for this.  He was not prescribed Tamiflu.  He says his sore throat and fevers have resolved.  Would like to screen him however for gonorrhea chlamydia.  In talking to him it sounds as if he has continued haphazard adherence to his antiretroviral therapy.  Past Medical History:  Diagnosis Date  . Cardiac anomaly, congenital   . Diarrhea 01/22/2017  . Dizziness 05/30/2020  . EBV infection    hx/notes 10/23/2016  . Erectile dysfunction 05/30/2020  . Gonorrhea 07/20/2019  . Hemoptysis 10/23/2016   [name] 10/23/2016  . HIV (human immunodeficiency virus infection) (HCC)   . Hypertension   . Migraine    "a few/year now; frequency is less than in the past" (10/23/2016)  . Nonadherence to medical treatment 09/08/2019  . Pharyngitis 10/23/2016   notes 10/23/2016  . Pulmonary embolism (HCC) 01/29/2016  . S/P bidirectional Glenn shunt    hx/notes 10/23/2016  . S/P Fontan procedure    hx/notes 10/23/2016  . Syphilis 07/20/2019  . Tricuspid atresia and stenosis, congenital     Past Surgical History:  Procedure Laterality Date  . CARDIAC CATHETERIZATION  01/2016    at [facility]; hx/notes 10/23/2016  . CARDIAC SURGERY  01/1997; 2000   fontane procedure at [facility] as a child     Family History  Problem Relation Age of Onset  . Hypertension Father       Social History   Socioeconomic History  . Marital status: Significant Other    Spouse name: Not on file  . Number of children: Not on file  . Years of education: Not on file  . Highest education level: Not on file  Occupational History  . Not on file  Tobacco Use  . Smoking status: Never Smoker  . Smokeless tobacco: Never Used  Substance and Sexual Activity  . Alcohol use: Yes    Comment: 2 GLASSES A WEEK  . Drug use: Yes    Frequency: 2.0 times per week    Types: Marijuana  . Sexual activity: Yes    Partners: Male    Birth control/protection: Condom    Comment: given condoms  Other Topics Concern  . Not on file  Social History Narrative  . Not on file   Social Determinants of Health   Financial Resource Strain:   . Difficulty of Paying Living Expenses: Not on file  Food Insecurity:   . Worried About Programme researcher, broadcasting/film/video in the Last Year: Not on file  . Ran Out of Food in the Last Year: Not on file  Transportation Needs:   .  Lack of Transportation (Medical): Not on file  . Lack of Transportation (Non-Medical): Not on file  Physical Activity:   . Days of Exercise per Week: Not on file  . Minutes of Exercise per Session: Not on file  Stress:   . Feeling of Stress : Not on file  Social Connections:   . Frequency of Communication with Friends and Family: Not on file  . Frequency of Social Gatherings with Friends and Family: Not on file  . Attends Religious Services: Not on file  . Active Member of Clubs or Organizations: Not on file  . Attends Banker Meetings: Not on file  . Marital Status: Not on file    No Known Allergies   Current Outpatient Medications:  .  SYMTUZA 800-150-200-10 MG TABS, TAKE 1 TABLET BY MOUTH DAILY WITH BREAKFAST., Disp: 30 tablet, Rfl:  5 .  fexofenadine (ALLEGRA) 180 MG tablet, Take 180 mg by mouth daily. (Patient not taking: Reported on 06/07/2020), Disp: , Rfl:     Review of Systems  Constitutional: Negative for activity change, appetite change, chills, diaphoresis, fatigue, fever and unexpected weight change.  HENT: Negative for congestion, rhinorrhea, sinus pressure, sneezing, sore throat and trouble swallowing.   Eyes: Negative for photophobia and visual disturbance.  Respiratory: Negative for cough, chest tightness, shortness of breath, wheezing and stridor.   Cardiovascular: Negative for chest pain, palpitations and leg swelling.  Gastrointestinal: Negative for abdominal distention, abdominal pain, anal bleeding, blood in stool, constipation, diarrhea, nausea and vomiting.  Genitourinary: Negative for difficulty urinating, dysuria, flank pain and hematuria.  Musculoskeletal: Negative for arthralgias, back pain, gait problem, joint swelling and myalgias.  Skin: Negative for color change, pallor, rash and wound.  Neurological: Negative for dizziness, tremors, weakness, light-headedness and numbness.  Hematological: Negative for adenopathy. Does not bruise/bleed easily.  Psychiatric/Behavioral: Negative for agitation, behavioral problems, confusion, decreased concentration, dysphoric mood and sleep disturbance.       Objective:   Physical Exam Constitutional:      Appearance: He is well-developed.  HENT:     Head: Normocephalic and atraumatic.  Eyes:     Conjunctiva/sclera: Conjunctivae normal.  Cardiovascular:     Rate and Rhythm: Normal rate and regular rhythm.  Pulmonary:     Effort: Pulmonary effort is normal. No respiratory distress.     Breath sounds: No wheezing.  Abdominal:     General: There is no distension.     Palpations: Abdomen is soft.  Musculoskeletal:        General: No tenderness. Normal range of motion.     Cervical back: Normal range of motion and neck supple.  Skin:    General: Skin  is warm and dry.     Coloration: Skin is not pale.     Findings: No erythema or rash.  Neurological:     General: No focal deficit present.     Mental Status: He is alert and oriented to person, place, and time.  Psychiatric:        Attention and Perception: Attention normal.        Mood and Affect: Mood normal.        Speech: Speech is delayed.        Behavior: Behavior is slowed. Behavior is cooperative.        Thought Content: Thought content normal.        Cognition and Memory: Memory normal.           Assessment & Plan:   HIV disease  with poor adherence: Viral load today and CD4 count with resistance testing  Would consider trying to use Cabenuva off label to try to control his virus since he does so poorly taking pills by himself.  History of syphilis gonorrhea chlamydia test for all these including test that extra genital sites.   Erectile dysfunction: Strange constellation of symptoms to me about last visit.  His endocrine studies were normal.  We will revisit this if he is still having trouble in the future.  Fever sore throat: Could have been due to influenza but we will get an chest for other etiologies including GC via testing of the oropharynx

## 2020-06-09 LAB — CYTOLOGY, (ORAL, ANAL, URETHRAL) ANCILLARY ONLY
Chlamydia: NEGATIVE
Chlamydia: NEGATIVE
Comment: NEGATIVE
Comment: NEGATIVE
Comment: NORMAL
Comment: NORMAL
Neisseria Gonorrhea: NEGATIVE
Neisseria Gonorrhea: NEGATIVE

## 2020-06-09 LAB — URINE CYTOLOGY ANCILLARY ONLY
Chlamydia: NEGATIVE
Comment: NEGATIVE
Comment: NORMAL
Neisseria Gonorrhea: NEGATIVE

## 2020-06-20 LAB — COMPLETE METABOLIC PANEL WITHOUT GFR
AG Ratio: 1.1 (calc) (ref 1.0–2.5)
ALT: 22 U/L (ref 9–46)
AST: 25 U/L (ref 10–40)
Albumin: 4.4 g/dL (ref 3.6–5.1)
Alkaline phosphatase (APISO): 86 U/L (ref 36–130)
BUN: 8 mg/dL (ref 7–25)
CO2: 24 mmol/L (ref 20–32)
Calcium: 9.3 mg/dL (ref 8.6–10.3)
Chloride: 107 mmol/L (ref 98–110)
Creat: 0.93 mg/dL (ref 0.60–1.35)
GFR, Est African American: 134 mL/min/{1.73_m2}
GFR, Est Non African American: 115 mL/min/{1.73_m2}
Globulin: 4 g/dL — ABNORMAL HIGH (ref 1.9–3.7)
Glucose, Bld: 89 mg/dL (ref 65–99)
Potassium: 3.9 mmol/L (ref 3.5–5.3)
Sodium: 139 mmol/L (ref 135–146)
Total Bilirubin: 0.4 mg/dL (ref 0.2–1.2)
Total Protein: 8.4 g/dL — ABNORMAL HIGH (ref 6.1–8.1)

## 2020-06-20 LAB — CBC WITH DIFFERENTIAL/PLATELET
Absolute Monocytes: 393 cells/uL (ref 200–950)
Basophils Absolute: 30 cells/uL (ref 0–200)
Basophils Relative: 0.9 %
Eosinophils Absolute: 172 cells/uL (ref 15–500)
Eosinophils Relative: 5.2 %
HCT: 40.2 % (ref 38.5–50.0)
Hemoglobin: 13.7 g/dL (ref 13.2–17.1)
Lymphs Abs: 838 cells/uL — ABNORMAL LOW (ref 850–3900)
MCH: 29.9 pg (ref 27.0–33.0)
MCHC: 34.1 g/dL (ref 32.0–36.0)
MCV: 87.8 fL (ref 80.0–100.0)
MPV: 11.7 fL (ref 7.5–12.5)
Monocytes Relative: 11.9 %
Neutro Abs: 1868 cells/uL (ref 1500–7800)
Neutrophils Relative %: 56.6 %
Platelets: 248 10*3/uL (ref 140–400)
RBC: 4.58 10*6/uL (ref 4.20–5.80)
RDW: 13.2 % (ref 11.0–15.0)
Total Lymphocyte: 25.4 %
WBC: 3.3 10*3/uL — ABNORMAL LOW (ref 3.8–10.8)

## 2020-06-20 LAB — FLUORESCENT TREPONEMAL AB(FTA)-IGG-BLD: Fluorescent Treponemal ABS: REACTIVE — AB

## 2020-06-20 LAB — HIV-1 RNA ULTRAQUANT REFLEX TO GENTYP+
HIV 1 RNA Quant: 55700 copies/mL — ABNORMAL HIGH
HIV-1 RNA Quant, Log: 4.75 Log copies/mL — ABNORMAL HIGH

## 2020-06-20 LAB — RPR TITER: RPR Titer: 1:4 {titer} — ABNORMAL HIGH

## 2020-06-20 LAB — HIV-1 GENOTYPE: HIV-1 Genotype: DETECTED — AB

## 2020-06-20 LAB — SYPHILIS: RPR W/REFLEX TO RPR TITER AND TREPONEMAL ANTIBODIES, TRADITIONAL SCREENING AND DIAGNOSIS ALGORITHM: RPR Ser Ql: REACTIVE — AB

## 2020-06-22 MED FILL — SYMTUZA 800-150-200-10 MG T: 800-150-200 | 30 days supply | Qty: 30 | Fill #2

## 2020-06-23 ENCOUNTER — Other Ambulatory Visit: Payer: Self-pay

## 2020-06-23 ENCOUNTER — Ambulatory Visit (INDEPENDENT_AMBULATORY_CARE_PROVIDER_SITE_OTHER): Payer: 59

## 2020-06-23 DIAGNOSIS — Z23 Encounter for immunization: Secondary | ICD-10-CM

## 2020-06-23 NOTE — Progress Notes (Signed)
   Covid-19 Vaccination Clinic  Name:  Lean Fayson    MRN: [id number] DOB: 31-Mar-1996  06/23/2020  Mr. Head was observed post Covid-19 immunization for 15 minutes without incident. He was provided with Vaccine Information Sheet and instruction to access the V-Safe system.   Mr. Brandi was instructed to call 911 with any severe reactions post vaccine: Marland Kitchen Difficulty breathing  . Swelling of face and throat  . A fast heartbeat  . A bad rash all over body  . Dizziness and weakness   Immunizations Administered    Name Date Dose VIS Date Route   Pfizer COVID-19 Vaccine 06/23/2020  9:57 AM 0.3 mL 05/03/2020 Intramuscular   Manufacturer: [employer], [employer]   Lot: [id number]   NDC: [address]-7     Rosanna Randy, RN

## 2020-07-17 MED FILL — SYMTUZA 800-150-200-10 MG T: 800-150-200 | 30 days supply | Qty: 30 | Fill #3

## 2020-07-26 ENCOUNTER — Ambulatory Visit: Payer: 59 | Admitting: Infectious Disease

## 2020-08-11 ENCOUNTER — Other Ambulatory Visit: Payer: Self-pay

## 2020-08-11 ENCOUNTER — Encounter (INDEPENDENT_AMBULATORY_CARE_PROVIDER_SITE_OTHER): Payer: Self-pay | Admitting: *Deleted

## 2020-08-11 VITALS — BP 138/85 | HR 84 | Temp 98.3°F | Wt 220.4 lb

## 2020-08-11 DIAGNOSIS — Z006 Encounter for examination for normal comparison and control in clinical research program: Secondary | ICD-10-CM

## 2020-08-11 NOTE — Research (Signed)
  Micheal Leonard was here for his week 24 visit for the [facility]. He denies any new problems and has gotten both of his covid vaccines. His last VL checked in the clinic was elevated at 55, 700 which looked like he was not taking his meds at all.  He tells me they make him feel sick, so we discussed taking something for Nausea when he takes his meds to see if that will help. I did stress the importance of his getting suppressed or risking getting seriously ill in the near future since his CD4 is only 223. He seemed to understand and said he would try better in the future. We will be testing his VL on study in 4 weeks. He is interested in getting the cabaneuva but knows that he will have to get his VL down first.  [facility] was supposed to have called him to do an intake, but he says they never called. He received his 3rd engerix hep B vaccine today without any problem and was instructed on checking his temperature every day for the next 7 days and noting whether he has any side effects from the vaccine.  I did schedule him for an appt. With Dr. [name] in 2 weeks because he missed the last one.

## 2020-08-15 MED FILL — SYMTUZA 800-150-200-10 MG T: 800-150-200 | 30 days supply | Qty: 30 | Fill #4

## 2020-08-24 ENCOUNTER — Other Ambulatory Visit: Payer: Self-pay

## 2020-08-24 ENCOUNTER — Ambulatory Visit (INDEPENDENT_AMBULATORY_CARE_PROVIDER_SITE_OTHER): Payer: 59 | Admitting: Infectious Disease

## 2020-08-24 ENCOUNTER — Other Ambulatory Visit (HOSPITAL_COMMUNITY)
Admission: RE | Admit: 2020-08-24 | Discharge: 2020-08-24 | Disposition: A | Discharge status: Home / Self Care | Payer: 59 | Source: Ambulatory Visit | Attending: Infectious Disease | Admitting: Infectious Disease

## 2020-08-24 ENCOUNTER — Other Ambulatory Visit: Payer: Self-pay | Admitting: Infectious Disease

## 2020-08-24 VITALS — Temp 98.0°F | Wt 220.0 lb

## 2020-08-24 DIAGNOSIS — A549 Gonococcal infection, unspecified: Secondary | ICD-10-CM

## 2020-08-24 DIAGNOSIS — Z7185 Encounter for immunization safety counseling: Secondary | ICD-10-CM

## 2020-08-24 DIAGNOSIS — A539 Syphilis, unspecified: Secondary | ICD-10-CM | POA: Insufficient documentation

## 2020-08-24 DIAGNOSIS — B20 Human immunodeficiency virus [HIV] disease: Secondary | ICD-10-CM | POA: Diagnosis not present

## 2020-08-24 MED ORDER — BIKTARVY 50-200-25 MG PO TABS: 1.0000 | ORAL_TABLET | Freq: Every day | ORAL | 11 refills | Status: DC

## 2020-08-24 MED FILL — BIKTARVY 50-200-25 MG TABS: 50-200-25 | 30 days supply | Qty: 30 | Fill #0

## 2020-08-24 NOTE — Progress Notes (Signed)
Subjective:  Chief complaint: Follow-up for HIV disease  Patient ID: Micheal Leonard, male    DOB: [birth year], 25 y.o.   MRN: [id number]  HPI   Micheal Leonard is a 25 year old African-American man living with HIV who was enrolled in our LATTITUDE study via [facility] at unfortunately could not maintain virological suppression and no longer in the study.  He is in the hepatitis B study through the [facility].  He told staff that he was taking his antiretrovirals but when I talked to myself he said he was having difficulty taking them.  He says that he feels tired a lot of the time and also feels nauseous and feels like he is going to throw up while taking the [facility].  His most viral load was above 50,000 and CD4 count is teetering on dropping below 200.  His genotype did not show resistance thanks to the high barrier that [facility] provides and the fact that he probably is not taking much of his medicines at all.  We had an extensive conversation together.  I am very worried about Micheal Leonard.  I explained to him that we have been using SYMTUZA due to his high barrier to resistance into the fact that he has not shown consistent adherence to the medication.  I am willing to switch him to a different drug that will be likely more tolerable and also have fairly high barrier to resistance albeit not as high as darunavir.  We will therefore switch him to [facility] and I will try to follow him very closely if he can come to clinic reliably.  I understand from the notes that he has been interested in long-acting Cabenuva and we could contemplate giving this to him off label but he currently is not taking antiretrovirals to suppress his medications and falling into the category of patients who this drug is indicated for at present     Past Medical History:  Diagnosis Date  . Cardiac anomaly, congenital   . Diarrhea 01/22/2017  . Dizziness 05/30/2020  . EBV infection    hx/notes 10/23/2016  . Erectile dysfunction  05/30/2020  . Gonorrhea 07/20/2019  . Hemoptysis 10/23/2016   [name] 10/23/2016  . HIV (human immunodeficiency virus infection) (HCC)   . Hypertension   . Migraine    "a few/year now; frequency is less than in the past" (10/23/2016)  . Nonadherence to medical treatment 09/08/2019  . Pharyngitis 10/23/2016   notes 10/23/2016  . Pulmonary embolism (HCC) 01/29/2016  . S/P bidirectional Glenn shunt    hx/notes 10/23/2016  . S/P Fontan procedure    hx/notes 10/23/2016  . Syphilis 07/20/2019  . Tricuspid atresia and stenosis, congenital     Past Surgical History:  Procedure Laterality Date  . CARDIAC CATHETERIZATION  01/2016   at [facility]; hx/notes 10/23/2016  . CARDIAC SURGERY  01/1997; 2000   fontane procedure at [facility] as a child     Family History  Problem Relation Age of Onset  . Hypertension Father       Social History   Socioeconomic History  . Marital status: Significant Other    Spouse name: Not on file  . Number of children: Not on file  . Years of education: Not on file  . Highest education level: Not on file  Occupational History  . Not on file  Tobacco Use  . Smoking status: Never Smoker  . Smokeless tobacco: Never Used  Substance and Sexual Activity  . Alcohol use: Yes    Comment: 2  GLASSES A WEEK  . Drug use: Yes    Frequency: 2.0 times per week    Types: Marijuana  . Sexual activity: Yes    Partners: Male    Birth control/protection: Condom    Comment: given condoms  Other Topics Concern  . Not on file  Social History Narrative  . Not on file   Social Determinants of Health   Financial Resource Strain: Not on file  Food Insecurity: Not on file  Transportation Needs: Not on file  Physical Activity: Not on file  Stress: Not on file  Social Connections: Not on file    No Known Allergies   Current Outpatient Medications:  .  fexofenadine (ALLEGRA) 180 MG tablet, Take 180 mg by mouth daily. (Patient not taking: Reported on 06/07/2020), Disp: , Rfl:  .   SYMTUZA 800-150-200-10 MG TABS, Take 1 tablet by mouth daily with breakfast., Disp: 30 tablet, Rfl: 11    Review of Systems  Constitutional: Negative for activity change, appetite change, chills, diaphoresis, fatigue, fever and unexpected weight change.  HENT: Negative for congestion, rhinorrhea, sinus pressure, sneezing, sore throat and trouble swallowing.   Eyes: Negative for photophobia and visual disturbance.  Respiratory: Negative for cough, chest tightness, shortness of breath, wheezing and stridor.   Cardiovascular: Negative for chest pain, palpitations and leg swelling.  Gastrointestinal: Negative for abdominal distention, abdominal pain, anal bleeding, blood in stool, constipation, diarrhea, nausea and vomiting.  Genitourinary: Negative for difficulty urinating, dysuria, flank pain and hematuria.  Musculoskeletal: Negative for arthralgias, back pain, gait problem, joint swelling and myalgias.  Skin: Negative for color change, pallor, rash and wound.  Neurological: Negative for dizziness, tremors, weakness, light-headedness and numbness.  Hematological: Negative for adenopathy. Does not bruise/bleed easily.  Psychiatric/Behavioral: Negative for agitation, behavioral problems, confusion, decreased concentration, dysphoric mood and sleep disturbance. The patient is nervous/anxious.        Objective:   Physical Exam Constitutional:      Appearance: He is well-developed.  HENT:     Head: Normocephalic and atraumatic.  Eyes:     Conjunctiva/sclera: Conjunctivae normal.  Cardiovascular:     Rate and Rhythm: Normal rate and regular rhythm.  Pulmonary:     Effort: Pulmonary effort is normal. No respiratory distress.     Breath sounds: No wheezing.  Abdominal:     General: There is no distension.     Palpations: Abdomen is soft.  Musculoskeletal:        General: No tenderness. Normal range of motion.     Cervical back: Normal range of motion and neck supple.  Skin:    General:  Skin is warm and dry.     Coloration: Skin is not pale.     Findings: No erythema or rash.  Neurological:     General: No focal deficit present.     Mental Status: He is alert and oriented to person, place, and time.  Psychiatric:        Attention and Perception: Attention normal.        Mood and Affect: Mood normal.        Speech: Speech is delayed.        Behavior: Behavior is slowed. Behavior is cooperative.        Thought Content: Thought content normal.        Cognition and Memory: Memory normal.           Assessment & Plan:   HIV disease with poor adherence recheck viral load with reflex  to resistance genotype.  We will switch him to [place] return to clinic in a month and recheck labs at that time  Would consider trying to use Cabenuva off label to try to control his virus since he does so poorly taking pills by himself--however this would be off label at present and with im would also probably want to do direct to inject. I need more time for our staff to be comfortable with higher volumes of Cabenuva  History of syphilis gonorrhea chlamydia test for all these including test that extra genital sites.  COVID prevention: he can have booster at our Friday clinic

## 2020-08-25 LAB — URINE CYTOLOGY ANCILLARY ONLY
Chlamydia: NEGATIVE
Comment: NEGATIVE
Comment: NORMAL
Neisseria Gonorrhea: NEGATIVE

## 2020-08-25 LAB — CYTOLOGY, (ORAL, ANAL, URETHRAL) ANCILLARY ONLY
Chlamydia: NEGATIVE
Comment: NEGATIVE
Comment: NORMAL
Neisseria Gonorrhea: NEGATIVE

## 2020-08-25 LAB — T-HELPER CELL (CD4) - (RCID CLINIC ONLY)
CD4 % Helper T Cell: 23 % — ABNORMAL LOW (ref 33–65)
CD4 T Cell Abs: 194 /uL — ABNORMAL LOW (ref 400–1790)

## 2020-08-29 LAB — CYTOLOGY, (ORAL, ANAL, URETHRAL) ANCILLARY ONLY
Chlamydia: NEGATIVE
Comment: NEGATIVE
Comment: NORMAL
Neisseria Gonorrhea: NEGATIVE

## 2020-09-02 LAB — CBC WITH DIFFERENTIAL/PLATELET
Absolute Monocytes: 307 cells/uL (ref 200–950)
Basophils Absolute: 19 cells/uL (ref 0–200)
Basophils Relative: 0.6 %
Eosinophils Absolute: 109 cells/uL (ref 15–500)
Eosinophils Relative: 3.5 %
HCT: 44.2 % (ref 38.5–50.0)
Hemoglobin: 14.7 g/dL (ref 13.2–17.1)
Lymphs Abs: 856 cells/uL (ref 850–3900)
MCH: 28.8 pg (ref 27.0–33.0)
MCHC: 33.3 g/dL (ref 32.0–36.0)
MCV: 86.5 fL (ref 80.0–100.0)
MPV: 11.5 fL (ref 7.5–12.5)
Monocytes Relative: 9.9 %
Neutro Abs: 1810 cells/uL (ref 1500–7800)
Neutrophils Relative %: 58.4 %
Platelets: 273 10*3/uL (ref 140–400)
RBC: 5.11 10*6/uL (ref 4.20–5.80)
RDW: 13.1 % (ref 11.0–15.0)
Total Lymphocyte: 27.6 %
WBC: 3.1 10*3/uL — ABNORMAL LOW (ref 3.8–10.8)

## 2020-09-02 LAB — COMPLETE METABOLIC PANEL WITH GFR
AG Ratio: 0.9 (calc) — ABNORMAL LOW (ref 1.0–2.5)
ALT: 25 U/L (ref 9–46)
AST: 29 U/L (ref 10–40)
Albumin: 4.4 g/dL (ref 3.6–5.1)
Alkaline phosphatase (APISO): 139 U/L — ABNORMAL HIGH (ref 36–130)
BUN: 11 mg/dL (ref 7–25)
CO2: 26 mmol/L (ref 20–32)
Calcium: 9.6 mg/dL (ref 8.6–10.3)
Chloride: 102 mmol/L (ref 98–110)
Creat: 0.94 mg/dL (ref 0.60–1.35)
GFR, Est African American: 132 mL/min/{1.73_m2} (ref >=60)
GFR, Est Non African American: 114 mL/min/{1.73_m2} (ref >=60)
Globulin: 4.7 g/dL (calc) — ABNORMAL HIGH (ref 1.9–3.7)
Glucose, Bld: 99 mg/dL (ref 65–99)
Potassium: 4.3 mmol/L (ref 3.5–5.3)
Sodium: 137 mmol/L (ref 135–146)
Total Bilirubin: 0.6 mg/dL (ref 0.2–1.2)
Total Protein: 9.1 g/dL — ABNORMAL HIGH (ref 6.1–8.1)

## 2020-09-02 LAB — HIV-1 GENOTYPE: HIV-1 Genotype: DETECTED — AB

## 2020-09-02 LAB — HIV-1 RNA ULTRAQUANT REFLEX TO GENTYP+
HIV 1 RNA Quant: 65500 copies/mL — ABNORMAL HIGH
HIV-1 RNA Quant, Log: 4.82 Log copies/mL — ABNORMAL HIGH

## 2020-09-15 ENCOUNTER — Encounter: Payer: 59 | Admitting: *Deleted

## 2020-09-19 ENCOUNTER — Encounter (INDEPENDENT_AMBULATORY_CARE_PROVIDER_SITE_OTHER): Payer: Self-pay | Admitting: *Deleted

## 2020-09-19 ENCOUNTER — Other Ambulatory Visit: Payer: Self-pay

## 2020-09-19 VITALS — BP 133/84 | HR 98 | Temp 98.3°F | Wt 218.2 lb

## 2020-09-19 DIAGNOSIS — Z006 Encounter for examination for normal comparison and control in clinical research program: Secondary | ICD-10-CM

## 2020-09-19 MED FILL — BIKTARVY 50-200-25 MG TABS: 50-200-25 | 30 days supply | Qty: 30 | Fill #1

## 2020-09-19 NOTE — Addendum Note (Signed)
Addended by: Phill Myron on: 09/19/2020 03:44 PM   Modules accepted: Kipp Brood

## 2020-09-19 NOTE — Research (Signed)
Micheal Leonard was here for his week 28 visit for the [employer] study. He denies any new problems and did return his diary card from the last visit. He was switched to Biktarvy at the last visit (4 weeks ago)with Dr. [name] and says he is tolerating it fine, no side effects. We are checking his viral load today on study so we should be able to check on his adherence to it. We did discuss the importance of adherence and what will happen if he doesn't take it like he should. He says he has it next to his bedside so he can take it before he falls asleep. He will be returning for the next study visit on 3/25.

## 2020-09-20 LAB — COMPREHENSIVE METABOLIC PANEL
AG Ratio: 1 (calc) (ref 1.0–2.5)
ALT: 23 U/L (ref 9–46)
AST: 26 U/L (ref 10–40)
Albumin: 4.6 g/dL (ref 3.6–5.1)
Alkaline phosphatase (APISO): 130 U/L (ref 36–130)
BUN: 9 mg/dL (ref 7–25)
CO2: 24 mmol/L (ref 20–32)
Calcium: 9.3 mg/dL (ref 8.6–10.3)
Chloride: 103 mmol/L (ref 98–110)
Creat: 0.93 mg/dL (ref 0.60–1.35)
Globulin: 4.5 g/dL (calc) — ABNORMAL HIGH (ref 1.9–3.7)
Glucose, Bld: 151 mg/dL — ABNORMAL HIGH (ref 65–99)
Potassium: 3.8 mmol/L (ref 3.5–5.3)
Sodium: 138 mmol/L (ref 135–146)
Total Bilirubin: 0.6 mg/dL (ref 0.2–1.2)
Total Protein: 9.1 g/dL — ABNORMAL HIGH (ref 6.1–8.1)

## 2020-09-20 LAB — CD4/CD8 (T-HELPER/T-SUPPRESSOR CELL)
CD4 T Cell Abs: 213
CD4%: 26.6
CD8 % Suppressor T Cell: 297
CD8 T Cell Abs: 37.1

## 2020-09-20 LAB — CBC AND DIFFERENTIAL
HCT: 44 (ref 41–53)
Hemoglobin: 14.3 (ref 13.5–17.5)
Neutrophils Absolute: 2
Platelets: 255 (ref 150–399)
WBC: 3.3

## 2020-09-20 LAB — PHOSPHORUS: Phosphorus: 2.9 mg/dL (ref 2.5–4.5)

## 2020-09-20 LAB — CK: Total CK: 508 U/L — ABNORMAL HIGH (ref 44–196)

## 2020-09-20 LAB — CBC: RBC: 5.16 — AB (ref 3.87–5.11)

## 2020-09-29 LAB — HIV-1 RNA QUANT-NO REFLEX-BLD
Copies/mL: 1688
Log copies/ml (ULTRA): 3.23

## 2020-10-06 ENCOUNTER — Other Ambulatory Visit (HOSPITAL_COMMUNITY): Payer: Self-pay

## 2020-10-06 ENCOUNTER — Other Ambulatory Visit: Payer: 59

## 2020-10-06 ENCOUNTER — Encounter (INDEPENDENT_AMBULATORY_CARE_PROVIDER_SITE_OTHER): Payer: Self-pay | Admitting: *Deleted

## 2020-10-06 ENCOUNTER — Other Ambulatory Visit: Payer: Self-pay

## 2020-10-06 VITALS — BP 143/84 | HR 106 | Temp 98.4°F | Wt 217.1 lb

## 2020-10-06 DIAGNOSIS — B20 Human immunodeficiency virus [HIV] disease: Secondary | ICD-10-CM

## 2020-10-06 DIAGNOSIS — Z006 Encounter for examination for normal comparison and control in clinical research program: Secondary | ICD-10-CM

## 2020-10-06 NOTE — Research (Signed)
Micheal Leonard was here for his week 32 visit for the [employer] study. He denies any problems except for sneezing and coughing which he relates to allergies.  He says he started taking his Biktarvy in the morning when he first gets up because he always forgets at nighttime. The Susanne Borders is not bothering him like the Symtuza did, so hopefully he is more compliant with it. We did check a Viral load at this visit off study at Dr. Clinton Gallant request. He will be returning for study in July.

## 2020-10-09 LAB — HIV-1 RNA QUANT-NO REFLEX-BLD
HIV 1 RNA Quant: <20 Copies/mL — ABNORMAL HIGH
HIV-1 RNA Quant, Log: <1.3 Log cps/mL — ABNORMAL HIGH

## 2020-10-12 MED FILL — BIKTARVY 50-200-25 MG TABS: 50-200-25 | 30 days supply | Qty: 30 | Fill #2

## 2020-10-19 ENCOUNTER — Other Ambulatory Visit (HOSPITAL_COMMUNITY): Payer: Self-pay

## 2020-11-10 ENCOUNTER — Other Ambulatory Visit (HOSPITAL_COMMUNITY): Payer: Self-pay

## 2020-11-10 MED FILL — Bictegravir-Emtricitabine-Tenofovir AF Tab 50-200-25 MG: ORAL | 30 days supply | Qty: 30 | Fill #0 | Status: AC

## 2020-11-24 ENCOUNTER — Ambulatory Visit (INDEPENDENT_AMBULATORY_CARE_PROVIDER_SITE_OTHER): Payer: 59 | Admitting: Infectious Disease

## 2020-11-24 ENCOUNTER — Encounter: Payer: Self-pay | Admitting: Infectious Disease

## 2020-11-24 ENCOUNTER — Other Ambulatory Visit: Payer: Self-pay

## 2020-11-24 ENCOUNTER — Other Ambulatory Visit (HOSPITAL_COMMUNITY)
Admission: RE | Admit: 2020-11-24 | Discharge: 2020-11-24 | Disposition: A | Discharge status: Home / Self Care | Payer: 59 | Source: Ambulatory Visit | Attending: Infectious Disease | Admitting: Infectious Disease

## 2020-11-24 VITALS — BP 144/84 | HR 88 | Temp 98.0°F | Wt 217.0 lb

## 2020-11-24 DIAGNOSIS — A549 Gonococcal infection, unspecified: Secondary | ICD-10-CM | POA: Diagnosis not present

## 2020-11-24 DIAGNOSIS — R21 Rash and other nonspecific skin eruption: Secondary | ICD-10-CM | POA: Diagnosis present

## 2020-11-24 DIAGNOSIS — Z9889 Other specified postprocedural states: Secondary | ICD-10-CM | POA: Diagnosis present

## 2020-11-24 DIAGNOSIS — A564 Chlamydial infection of pharynx: Secondary | ICD-10-CM

## 2020-11-24 DIAGNOSIS — Z23 Encounter for immunization: Secondary | ICD-10-CM

## 2020-11-24 DIAGNOSIS — Z8774 Personal history of (corrected) congenital malformations of heart and circulatory system: Secondary | ICD-10-CM

## 2020-11-24 DIAGNOSIS — B20 Human immunodeficiency virus [HIV] disease: Secondary | ICD-10-CM | POA: Insufficient documentation

## 2020-11-24 DIAGNOSIS — A539 Syphilis, unspecified: Secondary | ICD-10-CM | POA: Diagnosis not present

## 2020-11-24 MED ORDER — PENICILLIN G BENZATHINE 1200000 UNIT/2ML IM SUSY
1.2000 10*6.[IU] | PREFILLED_SYRINGE | Freq: Once | INTRAMUSCULAR | Status: AC
  Administered 2020-11-24: 1.2 10*6.[IU] via INTRAMUSCULAR

## 2020-11-24 NOTE — Progress Notes (Signed)
   Covid-19 Vaccination Clinic  Name:  Micheal Leonard    MRN: [id number] DOB: [birth year]  11/24/2020  Mr. Micheal Leonard was observed post Covid-19 immunization for 15 minutes without incident. He was provided with Vaccine Information Sheet and instruction to access the V-Safe system.   Mr. Micheal Leonard was instructed to call 911 with any severe reactions post vaccine: Marland Kitchen Difficulty breathing  . Swelling of face and throat  . A fast heartbeat  . A bad rash all over body  . Dizziness and weakness     Micheal Leonard T Pricilla Loveless

## 2020-11-24 NOTE — Progress Notes (Signed)
Subjective:  Chief complaint: Follow-up for HIV disease  Patient ID: Micheal Leonard, male    DOB: [birth year], 25 y.o.   MRN: [id number]  HPI    Micheal Leonard is a 25 year old African-American man living with HIV who was enrolled in our LATTITUDE study via [facility] at unfortunately could not maintain virological suppression and no longer in the study.  He is in the hepatitis B study through the [facility].  He unfortunately did not do well then on the study and ultimately was not doing well with his adherence to [facility].  I changed him to [facility] and now his viral load in March was undetectable.  He is accompanied by a friend of his who is a Engineer, civil (consulting) and who says that he and his husband are good friends with Micheal Leonard and that they have kind of "adopted him."  Friend who sounds like he also has HIV says that he has been " pain after Micheal Leonard to take his medications."  Is a does have a rash which seems new and is concerning to me  for possible syphilis   He is again voicing interest in [place]  Past Medical History:  Diagnosis Date  . Cardiac anomaly, congenital   . Diarrhea 01/22/2017  . Dizziness 05/30/2020  . EBV infection    hx/notes 10/23/2016  . Erectile dysfunction 05/30/2020  . Gonorrhea 07/20/2019  . Hemoptysis 10/23/2016   Micheal Leonard 10/23/2016  . HIV (human immunodeficiency virus infection) (HCC)   . Hypertension   . Migraine    "a few/year now; frequency is less than in the past" (10/23/2016)  . Nonadherence to medical treatment 09/08/2019  . Pharyngitis 10/23/2016   notes 10/23/2016  . Pulmonary embolism (HCC) 01/29/2016  . S/P bidirectional Glenn shunt    hx/notes 10/23/2016  . S/P Fontan procedure    hx/notes 10/23/2016  . Syphilis 07/20/2019  . Tricuspid atresia and stenosis, congenital     Past Surgical History:  Procedure Laterality Date  . CARDIAC CATHETERIZATION  01/2016   at [facility]; hx/notes 10/23/2016  . CARDIAC SURGERY  01/1997; 2000   fontane procedure at [facility] as a child      Family History  Problem Relation Age of Onset  . Hypertension Father       Social History   Socioeconomic History  . Marital status: Significant Other    Spouse name: Not on file  . Number of children: Not on file  . Years of education: Not on file  . Highest education level: Not on file  Occupational History  . Not on file  Tobacco Use  . Smoking status: Never Smoker  . Smokeless tobacco: Never Used  Substance and Sexual Activity  . Alcohol use: Yes    Comment: 2 GLASSES A WEEK  . Drug use: Yes    Frequency: 2.0 times per week    Types: Marijuana  . Sexual activity: Yes    Partners: Male    Birth control/protection: Condom    Comment: declined condoms 11/2020  Other Topics Concern  . Not on file  Social History Narrative  . Not on file   Social Determinants of Health   Financial Resource Strain: Not on file  Food Insecurity: Not on file  Transportation Needs: Not on file  Physical Activity: Not on file  Stress: Not on file  Social Connections: Not on file    No Known Allergies   Current Outpatient Medications:  .  bictegravir-emtricitabine-tenofovir AF (BIKTARVY) 50-200-25 MG TABS tablet, TAKE 1 TABLET BY MOUTH  DAILY., Disp: 30 tablet, Rfl: 11 .  fexofenadine (ALLEGRA) 180 MG tablet, Take 180 mg by mouth daily., Disp: , Rfl:  .  lisinopril (ZESTRIL) 5 MG tablet, Take 5 mg by mouth daily., Disp: , Rfl:     Review of Systems  Constitutional: Negative for activity change, appetite change, chills, diaphoresis, fatigue, fever and unexpected weight change.  HENT: Negative for congestion, rhinorrhea, sinus pressure, sneezing, sore throat and trouble swallowing.   Eyes: Negative for photophobia and visual disturbance.  Respiratory: Negative for cough, chest tightness, shortness of breath, wheezing and stridor.   Cardiovascular: Negative for chest pain, palpitations and leg swelling.  Gastrointestinal: Negative for abdominal distention, abdominal pain, anal  bleeding, blood in stool, constipation, diarrhea, nausea and vomiting.  Genitourinary: Negative for difficulty urinating, dysuria, flank pain and hematuria.  Musculoskeletal: Negative for arthralgias, back pain, gait problem, joint swelling and myalgias.  Skin: Positive for rash. Negative for color change, pallor and wound.  Neurological: Negative for dizziness, tremors, weakness, light-headedness and numbness.  Hematological: Negative for adenopathy. Does not bruise/bleed easily.  Psychiatric/Behavioral: Negative for agitation, behavioral problems, confusion, decreased concentration, dysphoric mood and sleep disturbance.       Objective:   Physical Exam Constitutional:      Appearance: He is well-developed.  HENT:     Head: Normocephalic and atraumatic.  Eyes:     Conjunctiva/sclera: Conjunctivae normal.  Cardiovascular:     Rate and Rhythm: Normal rate and regular rhythm.  Pulmonary:     Effort: Pulmonary effort is normal. No respiratory distress.     Breath sounds: No wheezing.  Abdominal:     General: There is no distension.     Palpations: Abdomen is soft.  Musculoskeletal:        General: No tenderness. Normal range of motion.     Cervical back: Normal range of motion and neck supple.  Skin:    General: Skin is warm and dry.     Coloration: Skin is not pale.     Findings: Rash present. No erythema. Rash is crusting, macular and scaling.  Neurological:     General: No focal deficit present.     Mental Status: He is alert and oriented to person, place, and time.  Psychiatric:        Attention and Perception: Attention normal.        Mood and Affect: Mood normal.        Speech: Speech is delayed.        Behavior: Behavior is slowed. Behavior is cooperative.        Thought Content: Thought content normal.        Cognition and Memory: Memory normal.        Judgment: Judgment normal.     Rash Nov 24, 2020:              Assessment & Plan:   Rash: We will  give 2,400,000 units of Bicillin for possible syphilis and also check for syphilis through serology  STI screening screen for gonorrhea and chlamydia genital necks genital sites  HIV disease: Recheck viral load hopefully is continue to be adherent to his Biktarvy. Given his haphazhard prior parents he would not seem to be a good candidate for Cabenuva side of a research study but if he can remain suppressed I am willing to entertain this possibility.   COVID prevention: Gave him a booster of COVID-19 Pfizer vaccine  I spent greater than 40 minutes with the patient including greater than  50% of time in face to face counsel of the patient and in coordination of his care.

## 2020-11-26 LAB — URINE CYTOLOGY ANCILLARY ONLY
Chlamydia: NEGATIVE
Comment: NEGATIVE
Comment: NORMAL
Neisseria Gonorrhea: NEGATIVE

## 2020-11-26 LAB — CYTOLOGY, (ORAL, ANAL, URETHRAL) ANCILLARY ONLY
Chlamydia: NEGATIVE
Chlamydia: NEGATIVE
Comment: NEGATIVE
Comment: NEGATIVE
Comment: NORMAL
Comment: NORMAL
Neisseria Gonorrhea: NEGATIVE
Neisseria Gonorrhea: NEGATIVE

## 2020-11-27 ENCOUNTER — Telehealth: Payer: Self-pay

## 2020-11-27 NOTE — Telephone Encounter (Signed)
Patient notified of positive RPR and scheduled for 2 follow up appointments for 2.4 million units of bicillin per Dr. [name]. iNstructed to abstain from sexual intercourse and notify partners of positive test.   Rosanna Randy, RN

## 2020-11-27 NOTE — Telephone Encounter (Signed)
-----   Message from Randall Hiss, MD sent at 11/27/2020  4:04 PM EDT ----- Micheal Leonard needs to come back for dose #2 of benzathine penicillin on the 20th and then the third on the 27th.  We gave him his first dose at his clinic visit on the 13th when I suspected syphilis

## 2020-11-28 ENCOUNTER — Telehealth: Payer: Self-pay

## 2020-11-28 NOTE — Telephone Encounter (Signed)
-----   Message from Randall Hiss, MD sent at 11/28/2020  8:52 AM EDT ----- Micheal Leonard viremic again, Can he come back and see someone after actually taking meds for 2-4 weeks consistently he also needs syphilis treatment finished up

## 2020-11-28 NOTE — Telephone Encounter (Signed)
Thanks Micheal Leonard I sent for resistance testing. If he ever wants to be on Cabenuva he can't fail oral therapy first over and over

## 2020-11-28 NOTE — Telephone Encounter (Signed)
I spoke to patient and relay lab results to the patient. Patient reports he has only missed 3 doses of Biktarvy in the past month. I encouraged patient that he needs to make sure he is taking his medication daily and no missed doses. Patient verbalized understanding and scheduled to follow up 12/20/20. Micheal Leonard T Pricilla Loveless

## 2020-12-01 ENCOUNTER — Ambulatory Visit (INDEPENDENT_AMBULATORY_CARE_PROVIDER_SITE_OTHER): Payer: 59

## 2020-12-01 ENCOUNTER — Other Ambulatory Visit: Payer: Self-pay

## 2020-12-01 DIAGNOSIS — A539 Syphilis, unspecified: Secondary | ICD-10-CM

## 2020-12-01 MED ORDER — PENICILLIN G BENZATHINE 1200000 UNIT/2ML IM SUSY
1.2000 10*6.[IU] | PREFILLED_SYRINGE | Freq: Once | INTRAMUSCULAR | Status: AC
  Administered 2020-12-01: 1.2 10*6.[IU] via INTRAMUSCULAR

## 2020-12-01 NOTE — Progress Notes (Signed)
Patient in office today for his second Bicillin Injection 2.4 MU. Patient stated he did well with the first injection, but has some soreness. I did reiterate to patient to abstain from sex until completion of treatment and an additional 10 days. Patient offered condoms as well, which he declined. Patient had no question and tolerated injections today well. Micheal Leonard T Pricilla Loveless

## 2020-12-07 ENCOUNTER — Ambulatory Visit (INDEPENDENT_AMBULATORY_CARE_PROVIDER_SITE_OTHER): Payer: 59

## 2020-12-07 ENCOUNTER — Other Ambulatory Visit: Payer: Self-pay

## 2020-12-07 DIAGNOSIS — A539 Syphilis, unspecified: Secondary | ICD-10-CM

## 2020-12-07 LAB — CBC WITH DIFFERENTIAL/PLATELET
Absolute Monocytes: 368 cells/uL (ref 200–950)
Basophils Absolute: 38 cells/uL (ref 0–200)
Basophils Relative: 1.2 %
Eosinophils Absolute: 246 cells/uL (ref 15–500)
Eosinophils Relative: 7.7 %
HCT: 48 % (ref 38.5–50.0)
Hemoglobin: 15.5 g/dL (ref 13.2–17.1)
Lymphs Abs: 1078 cells/uL (ref 850–3900)
MCH: 27.4 pg (ref 27.0–33.0)
MCHC: 32.3 g/dL (ref 32.0–36.0)
MCV: 84.8 fL (ref 80.0–100.0)
MPV: 12 fL (ref 7.5–12.5)
Monocytes Relative: 11.5 %
Neutro Abs: 1469 cells/uL — ABNORMAL LOW (ref 1500–7800)
Neutrophils Relative %: 45.9 %
Platelets: 263 10*3/uL (ref 140–400)
RBC: 5.66 10*6/uL (ref 4.20–5.80)
RDW: 14 % (ref 11.0–15.0)
Total Lymphocyte: 33.7 %
WBC: 3.2 10*3/uL — ABNORMAL LOW (ref 3.8–10.8)

## 2020-12-07 LAB — COMPLETE METABOLIC PANEL WITH GFR
AG Ratio: 1.1 (calc) (ref 1.0–2.5)
ALT: 23 U/L (ref 9–46)
AST: 29 U/L (ref 10–40)
Albumin: 4.8 g/dL (ref 3.6–5.1)
Alkaline phosphatase (APISO): 95 U/L (ref 36–130)
BUN: 8 mg/dL (ref 7–25)
CO2: 27 mmol/L (ref 20–32)
Calcium: 9.8 mg/dL (ref 8.6–10.3)
Chloride: 104 mmol/L (ref 98–110)
Creat: 0.98 mg/dL (ref 0.60–1.35)
GFR, Est African American: 125 mL/min/{1.73_m2} (ref >=60)
GFR, Est Non African American: 108 mL/min/{1.73_m2} (ref >=60)
Globulin: 4.2 g/dL (calc) — ABNORMAL HIGH (ref 1.9–3.7)
Glucose, Bld: 96 mg/dL (ref 65–99)
Potassium: 4 mmol/L (ref 3.5–5.3)
Sodium: 139 mmol/L (ref 135–146)
Total Bilirubin: 0.4 mg/dL (ref 0.2–1.2)
Total Protein: 9 g/dL — ABNORMAL HIGH (ref 6.1–8.1)

## 2020-12-07 LAB — HIV RNA, RTPCR W/R GT (RTI, PI,INT)
HIV 1 RNA Quant: 1320 copies/mL — ABNORMAL HIGH
HIV-1 RNA Quant, Log: 3.12 Log copies/mL — ABNORMAL HIGH

## 2020-12-07 LAB — FLUORESCENT TREPONEMAL AB(FTA)-IGG-BLD: Fluorescent Treponemal ABS: REACTIVE — AB

## 2020-12-07 LAB — T-HELPER CELLS (CD4) COUNT (NOT AT ARMC)
Absolute CD4: 303 cells/uL — ABNORMAL LOW (ref 490–1740)
CD4 T Helper %: 27 % — ABNORMAL LOW (ref 30–61)
Total lymphocyte count: 1101 cells/uL (ref 850–3900)

## 2020-12-07 LAB — RPR TITER: RPR Titer: 1:128 {titer} — ABNORMAL HIGH

## 2020-12-07 LAB — HIV-1 INTEGRASE GENOTYPE

## 2020-12-07 LAB — HIV-1 GENOTYPE: HIV-1 Genotype: DETECTED — AB

## 2020-12-07 LAB — RPR: RPR Ser Ql: REACTIVE — AB

## 2020-12-07 MED ORDER — PENICILLIN G BENZATHINE 1200000 UNIT/2ML IM SUSY
1.2000 10*6.[IU] | PREFILLED_SYRINGE | Freq: Once | INTRAMUSCULAR | Status: AC
  Administered 2020-12-07: 1.2 10*6.[IU] via INTRAMUSCULAR

## 2020-12-07 NOTE — Progress Notes (Signed)
Reviewed allergiest. Patient tolerated Bicillin injections well. Reinforced abstinence until treatment completed plus an additional 10 days, offered condoms and encouraged use. Advised patient to notify sexual partners for testing and treatment. Patient verbalized understanding.   Sandie Ano, RN

## 2020-12-14 ENCOUNTER — Other Ambulatory Visit (HOSPITAL_COMMUNITY): Payer: Self-pay

## 2020-12-14 MED FILL — Bictegravir-Emtricitabine-Tenofovir AF Tab 50-200-25 MG: ORAL | 30 days supply | Qty: 30 | Fill #1 | Status: AC

## 2020-12-20 ENCOUNTER — Ambulatory Visit: Payer: 59 | Admitting: Infectious Disease

## 2021-01-10 ENCOUNTER — Other Ambulatory Visit (HOSPITAL_COMMUNITY): Payer: Self-pay

## 2021-01-10 MED FILL — Bictegravir-Emtricitabine-Tenofovir AF Tab 50-200-25 MG: ORAL | 30 days supply | Qty: 30 | Fill #2 | Status: AC

## 2021-01-25 ENCOUNTER — Encounter (INDEPENDENT_AMBULATORY_CARE_PROVIDER_SITE_OTHER): Payer: Self-pay | Admitting: *Deleted

## 2021-01-25 ENCOUNTER — Ambulatory Visit (INDEPENDENT_AMBULATORY_CARE_PROVIDER_SITE_OTHER): Payer: 59

## 2021-01-25 ENCOUNTER — Encounter: Payer: Self-pay | Admitting: Infectious Disease

## 2021-01-25 ENCOUNTER — Other Ambulatory Visit: Payer: Self-pay

## 2021-01-25 ENCOUNTER — Ambulatory Visit (INDEPENDENT_AMBULATORY_CARE_PROVIDER_SITE_OTHER): Payer: 59 | Admitting: Infectious Disease

## 2021-01-25 ENCOUNTER — Other Ambulatory Visit (HOSPITAL_COMMUNITY): Payer: Self-pay

## 2021-01-25 VITALS — BP 147/92 | HR 96 | Temp 98.8°F | Wt 209.4 lb

## 2021-01-25 VITALS — BP 147/92 | HR 96 | Temp 98.8°F | Wt 209.6 lb

## 2021-01-25 DIAGNOSIS — A539 Syphilis, unspecified: Secondary | ICD-10-CM | POA: Diagnosis not present

## 2021-01-25 DIAGNOSIS — A549 Gonococcal infection, unspecified: Secondary | ICD-10-CM | POA: Diagnosis not present

## 2021-01-25 DIAGNOSIS — Z9889 Other specified postprocedural states: Secondary | ICD-10-CM

## 2021-01-25 DIAGNOSIS — A564 Chlamydial infection of pharynx: Secondary | ICD-10-CM | POA: Diagnosis not present

## 2021-01-25 DIAGNOSIS — Z23 Encounter for immunization: Secondary | ICD-10-CM

## 2021-01-25 DIAGNOSIS — B20 Human immunodeficiency virus [HIV] disease: Secondary | ICD-10-CM | POA: Diagnosis not present

## 2021-01-25 DIAGNOSIS — Z006 Encounter for examination for normal comparison and control in clinical research program: Secondary | ICD-10-CM

## 2021-01-25 DIAGNOSIS — Z9119 Patient's noncompliance with other medical treatment and regimen: Secondary | ICD-10-CM

## 2021-01-25 DIAGNOSIS — Z91199 Patient's noncompliance with other medical treatment and regimen due to unspecified reason: Secondary | ICD-10-CM

## 2021-01-25 MED ORDER — BICTEGRAVIR-EMTRICITAB-TENOFOV 50-200-25 MG PO TABS
1.0000 | ORAL_TABLET | Freq: Every day | ORAL | 11 refills | Status: DC
  Filled 2021-01-25 – 2021-02-05 (×2): qty 30, 30d supply, fill #0
  Filled 2021-03-08: qty 30, 30d supply, fill #1
  Filled 2021-03-30: qty 30, 30d supply, fill #2
  Filled 2021-04-30: qty 30, 30d supply, fill #3
  Filled 2021-05-23: qty 30, 30d supply, fill #4
  Filled 2021-06-26: qty 30, 30d supply, fill #5
  Filled 2021-07-23: qty 30, 30d supply, fill #6

## 2021-01-25 NOTE — Progress Notes (Signed)
Subjective:  Chief complaint: Follow-up for HIV disease  Patient ID: Micheal Leonard, male    DOB: 1995/10/09, 25 y.o.   MRN: [id number]  HPI   Micheal Leonard is a 25 year old African-American man living with HIV who was enrolled in our LATTITUDE study via ACTG at unfortunately could not maintain virological suppression and no longer in the study.  He is in the hepatitis B study through the ACT G.  He unfortunately did not do well then on the study and ultimately was not doing well with his adherence to [facility].  I changed him to [facility] and now his viral load in March was undetectable.  Hewas accompanied by a friend of his who is a Engineer, civil (consulting) and who sa that he and his husband are good friends with Mervil and that they have kind of "adopted him."  Friend who sounds like he also has HIV says that he has been " pain after Kenric to take his medications."  At that visit patient had diffuse rash which was consistent with syphilis and was due to syphilis.  He was given penicillin at that visit.  Unfortunately become viremic again with wild-type virus clearly not taking the [facility] as religiously as he needs to.  He comes to clinic for follow-up today having also seen Selena Batten with a CTG for the [place] study.  Past Medical History:  Diagnosis Date   Cardiac anomaly, congenital    Diarrhea 01/22/2017   Dizziness 05/30/2020   EBV infection    hx/notes 10/23/2016   Erectile dysfunction 05/30/2020   Gonorrhea 07/20/2019   Hemoptysis 10/23/2016   [name] 10/23/2016   HIV (human immunodeficiency virus infection) (HCC)    Hypertension    Migraine    "a few/year now; frequency is less than in the past" (10/23/2016)   Nonadherence to medical treatment 09/08/2019   Pharyngitis 10/23/2016   notes 10/23/2016   Pulmonary embolism (HCC) 01/29/2016   S/P bidirectional Glenn shunt    hx/notes 10/23/2016   S/P Fontan procedure    hx/notes 10/23/2016   Syphilis 07/20/2019   Tricuspid atresia and stenosis, congenital      Past Surgical History:  Procedure Laterality Date   CARDIAC CATHETERIZATION  01/2016   at [facility]; hx/notes 10/23/2016   CARDIAC SURGERY  01/1997; 2000   fontane procedure at [facility] as a child     Family History  Problem Relation Age of Onset   Hypertension Father       Social History   Socioeconomic History   Marital status: Significant Other    Spouse name: Not on file   Number of children: Not on file   Years of education: Not on file   Highest education level: Not on file  Occupational History   Not on file  Tobacco Use   Smoking status: Never   Smokeless tobacco: Never  Substance and Sexual Activity   Alcohol use: Yes    Comment: 2 GLASSES A WEEK   Drug use: Yes    Frequency: 2.0 times per week    Types: Marijuana   Sexual activity: Yes    Partners: Male    Birth control/protection: Condom    Comment: declined condoms  Other Topics Concern   Not on file  Social History Narrative   Not on file   Social Determinants of Health   Financial Resource Strain: Not on file  Food Insecurity: Not on file  Transportation Needs: Not on file  Physical Activity: Not on file  Stress: Not on file  Social Connections: Not on file    No Known Allergies   Current Outpatient Medications:    lisinopril (ZESTRIL) 5 MG tablet, Take 5 mg by mouth daily., Disp: , Rfl:    bictegravir-emtricitabine-tenofovir AF (BIKTARVY) 50-200-25 MG TABS tablet, TAKE 1 TABLET BY MOUTH DAILY., Disp: 30 tablet, Rfl: 11   fexofenadine (ALLEGRA) 180 MG tablet, Take 180 mg by mouth daily. (Patient not taking: Reported on 01/25/2021), Disp: , Rfl:     Review of Systems  Constitutional:  Negative for activity change, appetite change, chills, diaphoresis, fatigue, fever and unexpected weight change.  HENT:  Negative for congestion, rhinorrhea, sinus pressure, sneezing, sore throat and trouble swallowing.   Eyes:  Negative for photophobia and visual disturbance.  Respiratory:  Negative for cough,  chest tightness, shortness of breath, wheezing and stridor.   Cardiovascular:  Negative for chest pain, palpitations and leg swelling.  Gastrointestinal:  Negative for abdominal distention, abdominal pain, anal bleeding, blood in stool, constipation, diarrhea, nausea and vomiting.  Genitourinary:  Negative for difficulty urinating, dysuria, flank pain and hematuria.  Musculoskeletal:  Negative for arthralgias, back pain, gait problem, joint swelling and myalgias.  Skin:  Positive for rash. Negative for color change, pallor and wound.  Neurological:  Negative for dizziness, tremors, weakness, light-headedness and numbness.  Hematological:  Negative for adenopathy. Does not bruise/bleed easily.  Psychiatric/Behavioral:  Negative for agitation, behavioral problems, confusion, decreased concentration, dysphoric mood and sleep disturbance.       Objective:   Physical Exam HENT:     Head: Normocephalic and atraumatic.  Eyes:     Conjunctiva/sclera: Conjunctivae normal.  Cardiovascular:     Rate and Rhythm: Normal rate and regular rhythm.  Pulmonary:     Effort: Pulmonary effort is normal. No respiratory distress.     Breath sounds: No wheezing.  Abdominal:     General: There is no distension.     Palpations: Abdomen is soft.  Musculoskeletal:        General: No tenderness. Normal range of motion.     Cervical back: Normal range of motion and neck supple.  Skin:    General: Skin is warm and dry.     Coloration: Skin is not pale.     Findings: Rash present. No erythema. Rash is crusting, macular and scaling.  Neurological:     General: No focal deficit present.     Mental Status: He is alert and oriented to person, place, and time.  Psychiatric:        Attention and Perception: Attention normal.        Mood and Affect: Mood normal.        Speech: Speech is delayed.        Behavior: Behavior is slowed. Behavior is cooperative.        Thought Content: Thought content normal.         Cognition and Memory: Memory normal.        Judgment: Judgment normal.    Rash Nov 24, 2020:              Assessment & Plan:   HIV disease: check VL with reflex genotype. Emphasized need to be adherent.  Syphilis: sp treatment, partners can be tested and treated here GC and chlamydia: did not have at last screen and did not want to be checked today  COVID prevention: gave him another vaccine   I spent more than 30 minutes with the patient including face to face counseling of the patient personally  reviewing radiographs, along with pertinent laboratory microbiological, virological data review of medical records before and during the visit and in coordination of his care.

## 2021-01-25 NOTE — Progress Notes (Signed)
   Covid-19 Vaccination Clinic  Name:  Micheal Leonard    MRN: [id number] DOB: July 05, 1996  01/25/2021  Mr. Micheal Leonard was observed post Covid-19 immunization for 15 minutes without incident. He was provided with Vaccine Information Sheet and instruction to access the V-Safe system.   Mr. Micheal Leonard was instructed to call 911 with any severe reactions post vaccine: Difficulty breathing  Swelling of face and throat  A fast heartbeat  A bad rash all over body  Dizziness and weakness   Immunizations Administered     Name Date Dose VIS Date Route   PFIZER Comrnaty(Gray TOP) Covid-19 Vaccine 01/25/2021 11:48 AM 0.3 mL 06/22/2020 Intramuscular   Manufacturer: [employer], [employer]   Lot: [id number]   NDC: [phone]      Keymora Grillot Lesli Albee, CMA

## 2021-01-25 NOTE — Research (Signed)
Micheal Leonard was here for his week 48 visit for A5379. He denies any new problems but does appear to have been treated for syphilis recently. Also, has started on lisinopril per his cardiology MD. We discussed adherence to his Biktarvy and he seems to still struggle some with taking it every night. He will be returning in December for the last study visit.

## 2021-01-26 ENCOUNTER — Encounter: Payer: 59 | Admitting: *Deleted

## 2021-01-31 ENCOUNTER — Telehealth: Payer: Self-pay

## 2021-01-31 NOTE — Telephone Encounter (Signed)
Spoke with patient, advised him that his viral load has increased to over 10,000. Emphasized importance of getting to an undetectable status and maintaining it in order to make [place] an option. Explained that the only way to maintain an undetectable viral load is to consistently take his Biktarvy daily as prescribed.   Attempted to assess any barriers to medication non-adherence, he states he has been able to get his Biktarvy from the pharmacy without any issue. Asked that he please call us if he experiences any trouble getting or taking his medication. Patient verbalized understanding and has no further questions.   Sandie Ano, RN

## 2021-01-31 NOTE — Telephone Encounter (Signed)
-----   Message from Randall Hiss, MD sent at 01/30/2021  7:25 PM EDT ----- Micheal Leonard still not doing a good job of taking his meds. If he wants to try [place] he needs to get his virus under control FIRST ----- Message ----- From: Interface, [facility] Lab Results In Sent: 01/30/2021   2:22 AM EDT To: Randall Hiss, MD

## 2021-02-02 LAB — HIV RNA, RTPCR W/R GT (RTI, PI,INT)
HIV 1 RNA Quant: 10300 copies/mL — ABNORMAL HIGH
HIV-1 RNA Quant, Log: 4.01 Log copies/mL — ABNORMAL HIGH

## 2021-02-02 LAB — HIV-1 INTEGRASE GENOTYPE

## 2021-02-02 LAB — HIV-1 GENOTYPE: HIV-1 Genotype: DETECTED — AB

## 2021-02-05 ENCOUNTER — Other Ambulatory Visit (HOSPITAL_COMMUNITY): Payer: Self-pay

## 2021-02-08 ENCOUNTER — Other Ambulatory Visit (HOSPITAL_COMMUNITY): Payer: Self-pay

## 2021-03-08 ENCOUNTER — Other Ambulatory Visit (HOSPITAL_COMMUNITY): Payer: Self-pay

## 2021-03-30 ENCOUNTER — Other Ambulatory Visit (HOSPITAL_COMMUNITY): Payer: Self-pay

## 2021-04-03 ENCOUNTER — Other Ambulatory Visit (HOSPITAL_COMMUNITY): Payer: Self-pay

## 2021-04-30 ENCOUNTER — Other Ambulatory Visit: Payer: Self-pay

## 2021-04-30 ENCOUNTER — Other Ambulatory Visit (HOSPITAL_COMMUNITY): Payer: Self-pay

## 2021-04-30 ENCOUNTER — Ambulatory Visit (INDEPENDENT_AMBULATORY_CARE_PROVIDER_SITE_OTHER): Payer: 59

## 2021-04-30 ENCOUNTER — Ambulatory Visit (INDEPENDENT_AMBULATORY_CARE_PROVIDER_SITE_OTHER): Payer: 59 | Admitting: Infectious Disease

## 2021-04-30 ENCOUNTER — Encounter: Payer: Self-pay | Admitting: Infectious Disease

## 2021-04-30 ENCOUNTER — Other Ambulatory Visit (HOSPITAL_COMMUNITY)
Admission: RE | Admit: 2021-04-30 | Discharge: 2021-04-30 | Disposition: A | Discharge status: Home / Self Care | Payer: 59 | Source: Ambulatory Visit | Attending: Infectious Disease | Admitting: Infectious Disease

## 2021-04-30 VITALS — BP 157/96 | HR 69 | Temp 98.3°F

## 2021-04-30 DIAGNOSIS — A549 Gonococcal infection, unspecified: Secondary | ICD-10-CM

## 2021-04-30 DIAGNOSIS — Z23 Encounter for immunization: Secondary | ICD-10-CM

## 2021-04-30 DIAGNOSIS — B20 Human immunodeficiency virus [HIV] disease: Secondary | ICD-10-CM | POA: Insufficient documentation

## 2021-04-30 DIAGNOSIS — A539 Syphilis, unspecified: Secondary | ICD-10-CM | POA: Diagnosis not present

## 2021-04-30 DIAGNOSIS — Z8774 Personal history of (corrected) congenital malformations of heart and circulatory system: Secondary | ICD-10-CM

## 2021-04-30 DIAGNOSIS — I10 Essential (primary) hypertension: Secondary | ICD-10-CM

## 2021-04-30 DIAGNOSIS — A564 Chlamydial infection of pharynx: Secondary | ICD-10-CM

## 2021-04-30 DIAGNOSIS — Z9889 Other specified postprocedural states: Secondary | ICD-10-CM

## 2021-04-30 DIAGNOSIS — Q249 Congenital malformation of heart, unspecified: Secondary | ICD-10-CM

## 2021-04-30 HISTORY — DX: Essential (primary) hypertension: I10

## 2021-04-30 MED ORDER — PENICILLIN G BENZATHINE 1200000 UNIT/2ML IM SUSY
1.2000 10*6.[IU] | PREFILLED_SYRINGE | Freq: Once | INTRAMUSCULAR | Status: AC
  Administered 2021-04-30: 1.2 10*6.[IU] via INTRAMUSCULAR

## 2021-04-30 NOTE — Progress Notes (Signed)
Subjective:  Chief complaint: Follow-up for HIV disease complaining of a rash on his upper arms and also fatigue which she attributes to lisinopril  Patient ID: Micheal Leonard, male    DOB: [birth year], 25 y.o.   MRN: [id number]  HPI   Micheal Leonard is a 25 year old African-American man living with HIV who was enrolled in our LATTITUDE study via ACT[facility] at unfortunately could not maintain virological suppression and no longer in the study.  He is in the hepatitis B study through the ACT G.  He unfortunately did not do well then on the study and ultimately was not doing well with his adherence to [facility].  I changed him to [facility] and now his viral load in March was undetectable.  Hewas accompanied by a friend of his who is a Engineer, civil (consulting) and who sa that he and his husband are good friends with Gregorey and that they have kind of "adopted him."  He has a friendwho sounds like he also has HIV says that he has been " pain after Timmy to take his medications."  At that visit patient had diffuse rash which was consistent with syphilis and was due to syphilis.  He was given penicillin at that visit.  Unfortunately become viremic again with wild-type virus clearly not taking the [facility] as religiously as he needs to.  He is enrolled into [facility].  Note when he was here for today's visit he was apparently sitting in the lobby apparently waiting for someone to come get him outside of our clinic.  Aundra Millet called him he then came into the clinic and we are able to see him.  He had no complaints when I saw him but later showed a rash on his arms to [place].  Is not completely classic for syphilis but I have some concern for that given his history and we decided to treat him with 2,400,000 units of penicillin  As he stopped taking his lisinopril because it makes him feel fatigued.  I have asked him to follow-up with cardiology regarding this and remove the medication from his list as he has not taking  it.    Says he is taking BIKTARVY "sometimes" says he misses doses at least twice a week due to forgetting to take them.  He has a rash on his upper extremities that is been present for about a month always not sure if it is been there longer.    Past Medical History:  Diagnosis Date   Cardiac anomaly, congenital    Diarrhea 01/22/2017   Dizziness 05/30/2020   EBV infection    hx/notes 10/23/2016   Erectile dysfunction 05/30/2020   Gonorrhea 07/20/2019   Hemoptysis 10/23/2016   [name] 10/23/2016   HIV (human immunodeficiency virus infection) (HCC)    Hypertension    Migraine    "a few/year now; frequency is less than in the past" (10/23/2016)   Nonadherence to medical treatment 09/08/2019   Pharyngitis 10/23/2016   notes 10/23/2016   Pulmonary embolism (HCC) 01/29/2016   S/P bidirectional Glenn shunt    hx/notes 10/23/2016   S/P Fontan procedure    hx/notes 10/23/2016   Syphilis 07/20/2019   Tricuspid atresia and stenosis, congenital     Past Surgical History:  Procedure Laterality Date   CARDIAC CATHETERIZATION  01/2016   at [facility]; hx/notes 10/23/2016   CARDIAC SURGERY  01/1997; 2000   fontane procedure at [facility] as a child     Family History  Problem Relation Age of Onset   Hypertension  Father       Social History   Socioeconomic History   Marital status: Significant Other    Spouse name: Not on file   Number of children: Not on file   Years of education: Not on file   Highest education level: Not on file  Occupational History   Not on file  Tobacco Use   Smoking status: Never   Smokeless tobacco: Never  Substance and Sexual Activity   Alcohol use: Yes    Comment: 2 GLASSES A WEEK   Drug use: Yes    Frequency: 2.0 times per week    Types: Marijuana   Sexual activity: Yes    Partners: Male    Birth control/protection: Condom    Comment: declined condoms  Other Topics Concern   Not on file  Social History Narrative   Not on file   Social Determinants of  Health   Financial Resource Strain: Not on file  Food Insecurity: Not on file  Transportation Needs: Not on file  Physical Activity: Not on file  Stress: Not on file  Social Connections: Not on file    No Known Allergies   Current Outpatient Medications:    bictegravir-emtricitabine-tenofovir AF (BIKTARVY) 50-200-25 MG TABS tablet, TAKE 1 TABLET BY MOUTH DAILY., Disp: 30 tablet, Rfl: 11   fexofenadine (ALLEGRA) 180 MG tablet, Take 180 mg by mouth daily. (Patient not taking: Reported on 01/25/2021), Disp: , Rfl:    lisinopril (ZESTRIL) 5 MG tablet, Take 5 mg by mouth daily., Disp: , Rfl:     Review of Systems  Constitutional:  Negative for activity change, appetite change, chills, diaphoresis, fatigue, fever and unexpected weight change.  HENT:  Negative for congestion, rhinorrhea, sinus pressure, sneezing, sore throat and trouble swallowing.   Eyes:  Negative for photophobia and visual disturbance.  Respiratory:  Negative for cough, chest tightness, shortness of breath, wheezing and stridor.   Cardiovascular:  Negative for chest pain, palpitations and leg swelling.  Gastrointestinal:  Negative for abdominal distention, abdominal pain, anal bleeding, blood in stool, constipation, diarrhea, nausea and vomiting.  Genitourinary:  Negative for difficulty urinating, dysuria, flank pain and hematuria.  Musculoskeletal:  Negative for arthralgias, back pain, gait problem, joint swelling and myalgias.  Skin:  Positive for rash. Negative for color change, pallor and wound.  Neurological:  Negative for dizziness, tremors, weakness and light-headedness.  Hematological:  Negative for adenopathy. Does not bruise/bleed easily.  Psychiatric/Behavioral:  Negative for agitation, behavioral problems, confusion, decreased concentration, dysphoric mood and sleep disturbance.       Objective:   Physical Exam Constitutional:      General: He is not in acute distress.    Appearance: Normal appearance.  He is well-developed. He is not ill-appearing or diaphoretic.  HENT:     Head: Normocephalic and atraumatic.     Right Ear: Hearing and external ear normal.     Left Ear: Hearing and external ear normal.     Nose: No nasal deformity or rhinorrhea.  Eyes:     General: No scleral icterus.    Conjunctiva/sclera: Conjunctivae normal.     Right eye: Right conjunctiva is not injected.     Left eye: Left conjunctiva is not injected.     Pupils: Pupils are equal, round, and reactive to light.  Neck:     Vascular: No JVD.  Cardiovascular:     Rate and Rhythm: Normal rate and regular rhythm.     Heart sounds: S1 normal and S2 normal.  Pulmonary:     Effort: Pulmonary effort is normal. No respiratory distress.     Breath sounds: No wheezing.  Abdominal:     General: There is no distension.     Palpations: Abdomen is soft.  Musculoskeletal:        General: Normal range of motion.     Right shoulder: Normal.     Left shoulder: Normal.     Cervical back: Normal range of motion and neck supple.     Right hip: Normal.     Left hip: Normal.     Right knee: Normal.     Left knee: Normal.  Lymphadenopathy:     Head:     Right side of head: No submandibular, preauricular or posterior auricular adenopathy.     Left side of head: No submandibular, preauricular or posterior auricular adenopathy.     Cervical: No cervical adenopathy.     Right cervical: No superficial or deep cervical adenopathy.    Left cervical: No superficial or deep cervical adenopathy.  Skin:    General: Skin is warm and dry.     Coloration: Skin is not pale.     Findings: No abrasion, bruising, ecchymosis, erythema, lesion or rash.     Nails: There is no clubbing.  Neurological:     General: No focal deficit present.     Mental Status: He is alert and oriented to person, place, and time.     Sensory: No sensory deficit.     Coordination: Coordination normal.     Gait: Gait normal.  Psychiatric:        Attention and  Perception: He is attentive.        Mood and Affect: Mood is depressed.        Speech: Speech is delayed.        Behavior: Behavior normal. Behavior is cooperative.        Thought Content: Thought content normal.        Cognition and Memory: Memory normal.    Rash Nov 24, 2020:        Rash 04/30/2021: on arms seems similar to when we diagnosed him with syphilis      Assessment & Plan:   Rash: Certainly could be syphilis again we will be checking RPR titer and gave him 2,400,000 units of penicillin presumptively for diagnosis.  History of gonorrhea chlamydia and syphilis: We will screen for gonorrhea chlamydia and urine oropharynx and rectum.  HIV disease with poor adherence: Again I have emphasized the critical nature of being adherent to antiretroviral medications and of controlling his viremia.  I pointed out to him that over time as he has had unchecked viremia his CD4 count has been again able to drop over time and is now no longer in the normal range rather than the 300s approaching less than 200 which will be the area of Acquired Immune Deficiency Syndrome.  I am rechecking a viral load with reflex flex to genotype, I am continuing BIKTARVY for now he will likely need PCP prophylaxis soon.  Vaccine counseling: Counseled to give him bivalent COVID vaccine and flu shot

## 2021-04-30 NOTE — Progress Notes (Signed)
Reviewed and verified allergies with patient. Patient tolerated Bicillin injections well. Reinforced abstinence until treatment completed plus an additional 10 days, offered condoms and encouraged use. Advised patient to notify sexual partners for testing and treatment. Patient verbalized understanding.   Wright Gravely D Shaniah Baltes, RN  

## 2021-04-30 NOTE — Progress Notes (Signed)
   Covid-19 Vaccination Clinic  Name:  Micheal Leonard    MRN: [id number] DOB: 1995-12-03  04/30/2021  Mr. Heupel was observed post Covid-19 immunization for 15 minutes without incident. He was provided with Vaccine Information Sheet and instruction to access the V-Safe system.   Mr. Orcutt was instructed to call 911 with any severe reactions post vaccine: Difficulty breathing  Swelling of face and throat  A fast heartbeat  A bad rash all over body  Dizziness and weakness     Sandie Ano, RN

## 2021-05-01 LAB — URINE CYTOLOGY ANCILLARY ONLY
Chlamydia: NEGATIVE
Comment: NEGATIVE
Comment: NORMAL
Neisseria Gonorrhea: NEGATIVE

## 2021-05-01 LAB — CYTOLOGY, (ORAL, ANAL, URETHRAL) ANCILLARY ONLY
Chlamydia: NEGATIVE
Chlamydia: POSITIVE — AB
Comment: NEGATIVE
Comment: NEGATIVE
Comment: NORMAL
Comment: NORMAL
Neisseria Gonorrhea: NEGATIVE
Neisseria Gonorrhea: NEGATIVE

## 2021-05-01 LAB — T-HELPER CELL (CD4) - (RCID CLINIC ONLY)
CD4 % Helper T Cell: 26 % — ABNORMAL LOW (ref 33–65)
CD4 T Cell Abs: 305 /uL — ABNORMAL LOW (ref 400–1790)

## 2021-05-02 ENCOUNTER — Telehealth: Payer: Self-pay

## 2021-05-02 ENCOUNTER — Other Ambulatory Visit: Payer: Self-pay

## 2021-05-02 ENCOUNTER — Other Ambulatory Visit (HOSPITAL_COMMUNITY): Payer: Self-pay

## 2021-05-02 DIAGNOSIS — A749 Chlamydial infection, unspecified: Secondary | ICD-10-CM

## 2021-05-02 MED ORDER — DOXYCYCLINE HYCLATE 100 MG PO TABS
100.0000 mg | ORAL_TABLET | Freq: Two times a day (BID) | ORAL | 0 refills | Status: DC
  Filled 2021-05-02: qty 14, 7d supply, fill #0

## 2021-05-02 NOTE — Telephone Encounter (Signed)
Patient notified of lab results; instructed to abstain from sex for 14 days (7 days of treatment and additional 7 days after treatment) to ensure successful treatment. Patient instructed to notify partners to test/treat. Patient verbalized understanding and confirmed [facility] pharmacy as preferred pharm.  Eyvette Cordon Loyola Mast, RN

## 2021-05-02 NOTE — Telephone Encounter (Signed)
-----   Message from Randall Hiss, MD sent at 05/02/2021  8:40 AM EDT ----- Micheal Leonard needs doxycyline 100 mg twice daily for 7 days ----- Message ----- From: Interface, [facility] Lab Results In Sent: 04/30/2021  11:01 PM EDT To: Randall Hiss, MD

## 2021-05-09 ENCOUNTER — Telehealth: Payer: Self-pay

## 2021-05-09 ENCOUNTER — Other Ambulatory Visit (HOSPITAL_COMMUNITY): Payer: Self-pay

## 2021-05-09 DIAGNOSIS — A749 Chlamydial infection, unspecified: Secondary | ICD-10-CM

## 2021-05-09 LAB — HIV RNA, RTPCR W/R GT (RTI, PI,INT)
HIV 1 RNA Quant: 9280 copies/mL — ABNORMAL HIGH
HIV-1 RNA Quant, Log: 3.97 Log copies/mL — ABNORMAL HIGH

## 2021-05-09 LAB — COMPLETE METABOLIC PANEL WITH GFR
AG Ratio: 1.1 (calc) (ref 1.0–2.5)
ALT: 33 U/L (ref 9–46)
AST: 31 U/L (ref 10–40)
Albumin: 4.9 g/dL (ref 3.6–5.1)
Alkaline phosphatase (APISO): 83 U/L (ref 36–130)
BUN: 8 mg/dL (ref 7–25)
CO2: 27 mmol/L (ref 20–32)
Calcium: 9.9 mg/dL (ref 8.6–10.3)
Chloride: 103 mmol/L (ref 98–110)
Creat: 0.98 mg/dL (ref 0.60–1.24)
Globulin: 4.3 g/dL (calc) — ABNORMAL HIGH (ref 1.9–3.7)
Glucose, Bld: 90 mg/dL (ref 65–99)
Potassium: 3.9 mmol/L (ref 3.5–5.3)
Sodium: 140 mmol/L (ref 135–146)
Total Bilirubin: 0.7 mg/dL (ref 0.2–1.2)
Total Protein: 9.2 g/dL — ABNORMAL HIGH (ref 6.1–8.1)
eGFR: 110 mL/min/{1.73_m2} (ref >=60)

## 2021-05-09 LAB — CBC WITH DIFFERENTIAL/PLATELET
Absolute Monocytes: 420 cells/uL (ref 200–950)
Basophils Absolute: 32 cells/uL (ref 0–200)
Basophils Relative: 0.8 %
Eosinophils Absolute: 152 cells/uL (ref 15–500)
Eosinophils Relative: 3.8 %
HCT: 49.9 % (ref 38.5–50.0)
Hemoglobin: 16.3 g/dL (ref 13.2–17.1)
Lymphs Abs: 1384 cells/uL (ref 850–3900)
MCH: 28.8 pg (ref 27.0–33.0)
MCHC: 32.7 g/dL (ref 32.0–36.0)
MCV: 88.2 fL (ref 80.0–100.0)
MPV: 12.6 fL — ABNORMAL HIGH (ref 7.5–12.5)
Monocytes Relative: 10.5 %
Neutro Abs: 2012 cells/uL (ref 1500–7800)
Neutrophils Relative %: 50.3 %
Platelets: 176 10*3/uL (ref 140–400)
RBC: 5.66 10*6/uL (ref 4.20–5.80)
RDW: 13.9 % (ref 11.0–15.0)
Total Lymphocyte: 34.6 %
WBC: 4 10*3/uL (ref 3.8–10.8)

## 2021-05-09 LAB — HIV-1 INTEGRASE GENOTYPE

## 2021-05-09 LAB — RPR: RPR Ser Ql: REACTIVE — AB

## 2021-05-09 LAB — RPR TITER: RPR Titer: 1:16 {titer} — ABNORMAL HIGH

## 2021-05-09 LAB — HIV-1 GENOTYPE: HIV-1 Genotype: DETECTED — AB

## 2021-05-09 LAB — FLUORESCENT TREPONEMAL AB(FTA)-IGG-BLD: Fluorescent Treponemal ABS: REACTIVE — AB

## 2021-05-09 MED ORDER — DOXYCYCLINE HYCLATE 100 MG PO TABS
100.0000 mg | ORAL_TABLET | Freq: Two times a day (BID) | ORAL | 0 refills | Status: DC
  Filled 2021-05-09: qty 28, 14d supply, fill #0

## 2021-05-09 NOTE — Telephone Encounter (Signed)
Patient aware. Additional 28 tablets sent to patient pharmacy.     Wadsworth Skolnick Lesli Albee, CMA

## 2021-05-09 NOTE — Telephone Encounter (Signed)
-----   Message from Randall Hiss, MD sent at 05/09/2021  9:59 AM EDT ----- Regarding: Needs doxy  Did we get him going on doxy? He needs 7 to 21 days depending on where his chlamydia is

## 2021-05-23 ENCOUNTER — Other Ambulatory Visit (HOSPITAL_COMMUNITY): Payer: Self-pay

## 2021-05-29 ENCOUNTER — Other Ambulatory Visit (HOSPITAL_COMMUNITY): Payer: Self-pay

## 2021-06-01 ENCOUNTER — Ambulatory Visit: Payer: 59 | Admitting: Infectious Disease

## 2021-06-18 ENCOUNTER — Other Ambulatory Visit (HOSPITAL_COMMUNITY): Payer: Self-pay

## 2021-06-26 ENCOUNTER — Other Ambulatory Visit (HOSPITAL_COMMUNITY): Payer: Self-pay

## 2021-07-03 ENCOUNTER — Encounter: Payer: 59 | Admitting: *Deleted

## 2021-07-19 ENCOUNTER — Other Ambulatory Visit (HOSPITAL_COMMUNITY): Payer: Self-pay

## 2021-07-20 ENCOUNTER — Encounter (INDEPENDENT_AMBULATORY_CARE_PROVIDER_SITE_OTHER): Payer: 59 | Admitting: *Deleted

## 2021-07-20 ENCOUNTER — Other Ambulatory Visit: Payer: Self-pay

## 2021-07-20 VITALS — BP 153/100 | HR 102 | Temp 97.7°F | Wt 208.4 lb

## 2021-07-20 DIAGNOSIS — Z006 Encounter for examination for normal comparison and control in clinical research program: Secondary | ICD-10-CM

## 2021-07-20 NOTE — Research (Signed)
Fox was here for his last visit for [phone] with his father. He says he is doing fine, but noted that rash on his arms and legs continue.s He did not have syphilis when tested in October, but was positive for oral chlamydia and treated. We did discuss his adherence issues and the importance of getting his VL down and improving his immune system, which in turn may help his skin issues. He had stopped his lisinopril in the Fall and it looks like his cardiologist was okay with that.  His BP today was 153/I00. I did schedule an appt for him with Dr. [name] in March. His father said he would come with him to the appt. And also watch his BP which he can check at home.

## 2021-07-23 ENCOUNTER — Other Ambulatory Visit (HOSPITAL_COMMUNITY): Payer: Self-pay

## 2021-07-23 ENCOUNTER — Other Ambulatory Visit: Payer: Self-pay | Admitting: Pharmacist

## 2021-07-23 DIAGNOSIS — B20 Human immunodeficiency virus [HIV] disease: Secondary | ICD-10-CM

## 2021-07-23 NOTE — Progress Notes (Signed)
Unable to fill script at this time - entered in error

## 2021-08-01 ENCOUNTER — Other Ambulatory Visit (HOSPITAL_COMMUNITY): Payer: Self-pay

## 2021-08-01 ENCOUNTER — Other Ambulatory Visit: Payer: Self-pay

## 2021-08-01 MED ORDER — BICTEGRAVIR-EMTRICITAB-TENOFOV 50-200-25 MG PO TABS: 1.0000 | ORAL_TABLET | Freq: Every day | ORAL | 5 refills | Status: DC

## 2021-08-29 ENCOUNTER — Other Ambulatory Visit (HOSPITAL_COMMUNITY): Payer: Self-pay

## 2021-09-12 ENCOUNTER — Other Ambulatory Visit: Payer: 59

## 2021-09-17 ENCOUNTER — Emergency Department (HOSPITAL_COMMUNITY)
Admission: EM | Admit: 2021-09-17 | Discharge: 2021-09-17 | Disposition: A | Discharge status: Home / Self Care | Payer: 59 | Attending: Emergency Medicine | Admitting: Emergency Medicine

## 2021-09-17 ENCOUNTER — Encounter (HOSPITAL_COMMUNITY): Payer: Self-pay

## 2021-09-17 ENCOUNTER — Other Ambulatory Visit: Payer: Self-pay

## 2021-09-17 DIAGNOSIS — K920 Hematemesis: Secondary | ICD-10-CM | POA: Diagnosis not present

## 2021-09-17 DIAGNOSIS — I509 Heart failure, unspecified: Secondary | ICD-10-CM | POA: Insufficient documentation

## 2021-09-17 DIAGNOSIS — Z21 Asymptomatic human immunodeficiency virus [HIV] infection status: Secondary | ICD-10-CM | POA: Diagnosis not present

## 2021-09-17 DIAGNOSIS — F410 Panic disorder [episodic paroxysmal anxiety] without agoraphobia: Secondary | ICD-10-CM | POA: Insufficient documentation

## 2021-09-17 DIAGNOSIS — R112 Nausea with vomiting, unspecified: Secondary | ICD-10-CM | POA: Diagnosis present

## 2021-09-17 DIAGNOSIS — G43909 Migraine, unspecified, not intractable, without status migrainosus: Secondary | ICD-10-CM | POA: Insufficient documentation

## 2021-09-17 MED ORDER — ACETAMINOPHEN 500 MG PO TABS
1000.0000 mg | ORAL_TABLET | Freq: Once | ORAL | Status: AC
  Administered 2021-09-17: 1000 mg via ORAL
  Filled 2021-09-17: qty 2

## 2021-09-17 MED ORDER — ONDANSETRON 4 MG PO TBDP
4.0000 mg | ORAL_TABLET | Freq: Once | ORAL | Status: AC
  Administered 2021-09-17: 4 mg via ORAL
  Filled 2021-09-17: qty 1

## 2021-09-17 NOTE — ED Triage Notes (Signed)
Patient states he vomited x 2 today and both times he had a quarater-size amount of bright red blood in the emesis. Patient denies any abdominal pain. ?Patient states he began having a headache and a panic attack afterwards. ?

## 2021-09-17 NOTE — ED Provider Notes (Signed)
?[facility]-EMERGENCY DEPT ?Provider Note ? ? ?CSN: [id number] ?Arrival date & time: 09/17/21  1443 ? ?  ? ?History ? ?Chief Complaint  ?Patient presents with  ? Hematemesis  ? Headache  ? Panic Attack  ? ? ?Micheal Leonard is a 26 y.o. male. ? ?Patient presents after a migraine headache.  He has a history of migraines.  He said that he woke up this morning with a migraine headache and took his medicine at home which is typically Excedrin.  He said it then got a bit worse and he had an episode of vomiting.  He said he had actually 2 episodes of vomiting and had some red fluid with the emesis that he initially thought was blood.  In retrospect, he feels it was more likely fruit punch that he had drunk just before that.  He has not had any further episode of vomiting.  He said his headache is essentially gone currently.  He does not have any recent head trauma.  No neck pain.  No fevers.  No cough or cold symptoms.  No chest pain or shortness of breath.  He states his headache is typical for his normal migrainous type headaches.  He does not report any unusual symptoms.  He has had some emesis in the past with more severe headaches. ? ?Per chart review, he has a history of congenital heart disease including tricuspid atresia status post Fontan procedure in 2013.  He is followed by cardiology at [facility]  He also has a history of HIV.  He is followed by the infectious disease clinic. ? ? ?  ? ?Home Medications ?Prior to Admission medications   ?Medication Sig Start Date End Date Taking? Authorizing Provider  ?bictegravir-emtricitabine-tenofovir AF (BIKTARVY) 50-200-25 MG TABS tablet TAKE 1 TABLET BY MOUTH DAILY. 08/01/21 08/01/22  Randall Hiss, MD  ?doxycycline (VIBRA-TABS) 100 MG tablet Take 1 tablet by mouth 2 (two) times daily. 05/09/21   Randall Hiss, MD  ?fexofenadine (ALLEGRA) 180 MG tablet Take 180 mg by mouth daily.    [provider]  ?   ? ?Allergies    ?Other    ? ?Review of Systems   ?Review of Systems  ?Constitutional:  Negative for chills, diaphoresis, fatigue and fever.  ?HENT:  Negative for congestion, rhinorrhea and sneezing.   ?Eyes: Negative.   ?Respiratory:  Negative for cough, chest tightness and shortness of breath.   ?Cardiovascular:  Negative for chest pain and leg swelling.  ?Gastrointestinal:  Positive for nausea and vomiting. Negative for abdominal pain, blood in stool and diarrhea.  ?Genitourinary:  Negative for difficulty urinating, flank pain, frequency and hematuria.  ?Musculoskeletal:  Negative for arthralgias and back pain.  ?Skin:  Negative for rash.  ?Neurological:  Positive for headaches. Negative for dizziness, speech difficulty, weakness and numbness.  ? ?Physical Exam ?Updated Vital Signs ?BP (!) 158/97   Pulse 76   Temp 97.7 ?F (36.5 ?C) (Oral)   Resp 18   Ht 6' (1.829 m)   Wt 88.5 kg   SpO2 100%   BMI 26.45 kg/m?  ?Physical Exam ?Constitutional:   ?   Appearance: He is well-developed.  ?HENT:  ?   Head: Normocephalic and atraumatic.  ?Eyes:  ?   Extraocular Movements: Extraocular movements intact.  ?   Pupils: Pupils are equal, round, and reactive to light.  ?Neck:  ?   Comments: No meningismus ?Cardiovascular:  ?   Rate and Rhythm: Normal rate and regular  rhythm.  ?   Heart sounds: Normal heart sounds.  ?Pulmonary:  ?   Effort: Pulmonary effort is normal. No respiratory distress.  ?   Breath sounds: Normal breath sounds. No wheezing or rales.  ?Chest:  ?   Chest wall: No tenderness.  ?Abdominal:  ?   General: Bowel sounds are normal.  ?   Palpations: Abdomen is soft.  ?   Tenderness: There is no abdominal tenderness. There is no guarding or rebound.  ?Musculoskeletal:     ?   General: Normal range of motion.  ?   Cervical back: Normal range of motion and neck supple.  ?Lymphadenopathy:  ?   Cervical: No cervical adenopathy.  ?Skin: ?   General: Skin is warm and dry.  ?   Findings: No rash.  ?Neurological:  ?   Mental Status: He is  alert and oriented to person, place, and time.  ?   Comments: Motor 5/5 all extremities ?Sensation grossly intact to LT all extremities ?Finger to Nose intact, no pronator drift ?CN II-XII grossly intact ? ?  ? ? ?ED Results / Procedures / Treatments   ?Labs ?(all labs ordered are listed, but only abnormal results are displayed) ?Labs Reviewed - No data to display ? ?EKG ?None ? ?Radiology ?No results found. ? ?Procedures ?Procedures  ? ? ?Medications Ordered in ED ?Medications  ?acetaminophen (TYLENOL) tablet 1,000 mg (1,000 mg Oral Given 09/17/21 1511)  ?ondansetron (ZOFRAN-ODT) disintegrating tablet 4 mg (4 mg Oral Given 09/17/21 1512)  ? ? ?ED Course/ Medical Decision Making/ A&P ?  ?                        ?Medical Decision Making ? ?Patient is a 26 year old male who presents after he had a migrainous type headache with associated nausea and vomiting.  He initially had a panic attack as he thought he had some blood in his vomit but now feels that it was more likely fruit punch that he had recently ingested.  He has not had any ongoing episodes of vomiting.  His headache is essentially gone.  He does not have any unusual symptoms or other concerns for meningitis or subarachnoid hemorrhage.  No recent trauma.  He does not have associated abdominal pain.  I discussed with the patient and his mom checking some blood work if there was any concern that this was hematemesis but he feels strongly that was fruit punch and at this point does not want to have any labs drawn.  He is currently well-appearing and does not need hospitalization.  He was discharged home in good condition.  He was encouraged to follow-up with his PCP if his headaches continue.  I did talk to him that he should not be taking Excedrin with any frequency and if he has to take Excedrin more frequently, he should see his PCP regarding ongoing headache management.  Return precautions were given. ? ?His blood pressure was a little bit elevated.  He was  advised to monitor this at home and if it remains elevated, he should follow-up with his PCP. ? ?Final Clinical Impression(s) / ED Diagnoses ?Final diagnoses:  ?Migraine without status migrainosus, not intractable, unspecified migraine type  ? ? ?Rx / DC Orders ?ED Discharge Orders   ? ? None  ? ?  ? ? ?  ?Rolan Bucco, MD ?09/17/21 1820 ? ?

## 2021-09-17 NOTE — ED Provider Triage Note (Signed)
Emergency Medicine Provider Triage Evaluation Note ? ?Micheal Leonard , a 26 y.o. male  was evaluated in triage.  Pt complains of headache. Has hx of migraine.  Report vomit with trace of blood and felt panic attack ? ?Review of Systems  ?Positive: Headache, nausea, vomiting, panic attack  ?Negative: Fever, cold sxs, neck stiffness ? ?Physical Exam  ?BP (!) 175/109 (BP Location: Left Arm)   Pulse 81   Temp 97.7 ?F (36.5 ?C) (Oral)   Resp 18   Ht 6' (1.829 m)   Wt 88.5 kg   SpO2 94%   BMI 26.45 kg/m?  ?Gen:   Awake, no distress   ?Resp:  Normal effort  ?MSK:   Moves extremities without difficulty  ?Other:   ? ?Medical Decision Making  ?Medically screening exam initiated at 3:05 PM.  Appropriate orders placed.  Micheal Leonard was informed that the remainder of the evaluation will be completed by another provider, this initial triage assessment does not replace that evaluation, and the importance of remaining in the ED until their evaluation is complete. ? ?May benefit migraine cocktail.   ?  ?Fayrene Helper, PA-C ?09/17/21 1507 ? ?

## 2021-09-17 NOTE — Discharge Instructions (Addendum)
Follow-up with your primary care doctor if your headaches continue.  Monitor your blood pressure at home and follow-up with your doctor if your blood pressure remains elevated.  Return to the emergency room if you have any worsening symptoms. ?

## 2021-09-20 ENCOUNTER — Ambulatory Visit: Payer: 59 | Admitting: Infectious Disease

## 2021-09-20 ENCOUNTER — Encounter: Payer: Self-pay | Admitting: Infectious Disease

## 2021-09-20 ENCOUNTER — Other Ambulatory Visit (HOSPITAL_COMMUNITY): Payer: Self-pay

## 2021-09-20 ENCOUNTER — Other Ambulatory Visit: Payer: Self-pay

## 2021-09-20 VITALS — BP 163/96 | HR 88 | Temp 98.1°F | Ht 72.0 in | Wt 213.0 lb

## 2021-09-20 DIAGNOSIS — F819 Developmental disorder of scholastic skills, unspecified: Secondary | ICD-10-CM | POA: Diagnosis not present

## 2021-09-20 DIAGNOSIS — B20 Human immunodeficiency virus [HIV] disease: Secondary | ICD-10-CM | POA: Diagnosis not present

## 2021-09-20 DIAGNOSIS — A539 Syphilis, unspecified: Secondary | ICD-10-CM

## 2021-09-20 DIAGNOSIS — Z9889 Other specified postprocedural states: Secondary | ICD-10-CM

## 2021-09-20 DIAGNOSIS — I10 Essential (primary) hypertension: Secondary | ICD-10-CM

## 2021-09-20 MED ORDER — BICTEGRAVIR-EMTRICITAB-TENOFOV 50-200-25 MG PO TABS: 1.0000 | ORAL_TABLET | Freq: Every day | ORAL | 5 refills | Status: DC

## 2021-09-20 MED ORDER — AMLODIPINE BESYLATE 5 MG PO TABS: 5.0000 mg | ORAL_TABLET | Freq: Every day | ORAL | 11 refills | Status: DC

## 2021-09-20 NOTE — Progress Notes (Signed)
? ?Subjective:  ?Chief complaint: Follow-up for HIV disease with intermittent adherence to his Biktarvy ? Patient ID: Micheal Leonard, male    DOB: [birth year], 26 y.o.   MRN: [id number] ? ?HPI ? ? ?Micheal Leonard is a 26 year old African-American man living with HIV who was enrolled in our [place] study via ACT[facility] at unfortunately could not maintain virological suppression and no longer in the study.  Hehas been in the hepatitis B study through the ACT G. ? ?He unfortunately did not do well then on the [place] study and ultimately was not doing well with his adherence to [facility].  I changed him to [facility] at 1 point his viral load became undetectable. ? ? ?There was accompanied by his biological father who drove 2 hours to make sure that they could come to his appointment.  Micheal Leonard is living with his mother currently. ? ?They fill his pillboxes with medications but is really difficult for them to ensure that he does take the [facility] ? ?Both Micheal Leonard and they feel confident that he could come to appointments. ? ?Micheal Leonard had been in the ER with fairly severe headaches but did not have much of a work-up.  When I had seen the note about the headaches I was concerned that he might have cryptococcal meningitis certainly his CD4 count is dropped 4 200 and recent history. ? ?Currently is without headaches now. ? ? ?Past Medical History:  ?Diagnosis Date  ? Cardiac anomaly, congenital   ? Diarrhea 01/22/2017  ? Dizziness 05/30/2020  ? EBV infection   ? hx/notes 10/23/2016  ? Erectile dysfunction 05/30/2020  ? Gonorrhea 07/20/2019  ? Hemoptysis 10/23/2016  ? [name] 10/23/2016  ? HIV (human immunodeficiency virus infection) (HCC)   ? Hypertension   ? Hypertension 04/30/2021  ? Migraine   ? "a few/year now; frequency is less than in the past" (10/23/2016)  ? Nonadherence to medical treatment 09/08/2019  ? Pharyngitis  10/23/2016  ? notes 10/23/2016  ? Pulmonary embolism (HCC) 01/29/2016  ? S/P bidirectional [name] shunt   ? hx/notes 10/23/2016  ? S/P Fontan procedure   ? hx/notes 10/23/2016  ? Syphilis 07/20/2019  ? Tricuspid atresia and stenosis, congenital   ? ? ?Past Surgical History:  ?Procedure Laterality Date  ? CARDIAC CATHETERIZATION  01/2016  ? at [facility]; hx/notes 10/23/2016  ? CARDIAC SURGERY  01/1997; 2000  ? fontane procedure at [facility] as a child   ? ? ?Family History  ?Problem Relation Age of Onset  ? Hypertension Father   ? ? ?  ?Social History  ? ?Socioeconomic History  ? Marital status: Significant Other  ?  Spouse name: Not on file  ? Number of children: Not on file  ? Years of education: Not on file  ? Highest education level: Not on file  ?Occupational History  ? Not on file  ?Tobacco Use  ? Smoking status: Never  ? Smokeless tobacco: Never  ?Vaping Use  ? Vaping Use: Never used  ?Substance and Sexual Activity  ? Alcohol use: Not Currently  ?  Comment: 2 GLASSES A WEEK  ? Drug use: Not Currently  ?  Frequency: 2.0 times per week  ?  Types: Marijuana  ? Sexual activity: Yes  ?  Partners: Male  ?  Birth control/protection: Condom  ?  Comment: declined condoms  ?Other Topics Concern  ? Not on file  ?Social History  Narrative  ? Not on file  ? ?Social Determinants of Health  ? ?Financial Resource Strain: Not on file  ?Food Insecurity: Not on file  ?Transportation Needs: Not on file  ?Physical Activity: Not on file  ?Stress: Not on file  ?Social Connections: Not on file  ? ? ?Allergies  ?Allergen Reactions  ? Other   ?  An HIV drug and can not remember  exactly.  ? ? ? ?Current Outpatient Medications:  ?  bictegravir-emtricitabine-tenofovir AF (BIKTARVY) 50-200-25 MG TABS tablet, TAKE 1 TABLET BY MOUTH DAILY., Disp: 30 tablet, Rfl: 5 ?  fexofenadine (ALLEGRA) 180 MG tablet, Take 180 mg by mouth daily., Disp: , Rfl:  ?  doxycycline (VIBRA-TABS) 100 MG tablet, Take 1 tablet by mouth 2 (two) times daily. (Patient not taking:  Reported on 09/20/2021), Disp: 28 tablet, Rfl: 0 ? ? ? ?Review of Systems  ?Constitutional:  Negative for activity change, appetite change, chills, diaphoresis, fatigue, fever and unexpected weight change.  ?HENT:  Negative for congestion, rhinorrhea, sinus pressure, sneezing, sore throat and trouble swallowing.   ?Eyes:  Negative for photophobia and visual disturbance.  ?Respiratory:  Negative for cough, chest tightness, shortness of breath, wheezing and stridor.   ?Cardiovascular:  Negative for chest pain, palpitations and leg swelling.  ?Gastrointestinal:  Negative for abdominal distention, abdominal pain, anal bleeding, blood in stool, constipation, diarrhea, nausea and vomiting.  ?Genitourinary:  Negative for difficulty urinating, dysuria, flank pain and hematuria.  ?Musculoskeletal:  Negative for arthralgias, back pain, gait problem, joint swelling and myalgias.  ?Skin:  Negative for color change, pallor, rash and wound.  ?Neurological:  Positive for headaches. Negative for dizziness, tremors, weakness and light-headedness.  ?Hematological:  Negative for adenopathy. Does not bruise/bleed easily.  ?Psychiatric/Behavioral:  Negative for agitation, behavioral problems, confusion, decreased concentration, dysphoric mood and sleep disturbance.   ? ?   ?Objective:  ? Physical Exam ?Constitutional:   ?   Appearance: He is well-developed.  ?HENT:  ?   Head: Normocephalic and atraumatic.  ?Eyes:  ?   Conjunctiva/sclera: Conjunctivae normal.  ?Cardiovascular:  ?   Rate and Rhythm: Normal rate and regular rhythm.  ?Pulmonary:  ?   Effort: Pulmonary effort is normal. No respiratory distress.  ?   Breath sounds: No wheezing.  ?Abdominal:  ?   General: There is no distension.  ?   Palpations: Abdomen is soft.  ?Musculoskeletal:     ?   General: No tenderness. Normal range of motion.  ?   Cervical back: Normal range of motion and neck supple.  ?Skin: ?   General: Skin is warm and dry.  ?   Coloration: Skin is not pale.  ?    Findings: No erythema or rash.  ?Neurological:  ?   General: No focal deficit present.  ?   Mental Status: He is alert and oriented to person, place, and time.  ?Psychiatric:     ?   Mood and Affect: Mood normal.     ?   Behavior: Behavior normal.     ?   Thought Content: Thought content normal.     ?   Cognition and Memory: Cognition is impaired.     ?   Judgment: Judgment normal.  ? ? ? ?   ?Assessment & Plan:  ?HIV disease: ? ?I am checking a viral load reflex to genotype CD4 count. ? ?Reviewed his genotypes and he does have a D67 and but otherwise no other resistance mutations. ? ?I will continue  Biktarvy for now. ? ?I am willing to initiate Cabenuva for him even if he is viremic with the hope that giving him directly observed therapy at day 0 and day 30 and then potentially every 2 months) versus every month will give us a better ability to control [place]'s viral loads.  Perhaps his parents can ensure that he does come to these visits which could be as few as 6 times per year. ? ?History of syphilis: Rechecking RPR status posttreatment ? ?Hypertension starting amlodipine 5 mg for him. ? ?Headaches: Could have been related to blood pressure fortunately not having headache at this point time certainly my concern was for cryptococcal meningitis when saw the ER note. ? ?I spent 842 minutes with the patient including than 50% of the time in face to face counseling of the patient and his father regarding the nature of his current regimen long-acting therapy, along with review of medical records in preparation for the visit and during the visit and in coordination of hisr care. ?

## 2021-09-21 ENCOUNTER — Other Ambulatory Visit (HOSPITAL_COMMUNITY): Payer: Self-pay

## 2021-09-21 ENCOUNTER — Telehealth: Payer: Self-pay

## 2021-09-21 LAB — C. TRACHOMATIS/N. GONORRHOEAE RNA
C. trachomatis RNA, TMA: NOT DETECTED
N. gonorrhoeae RNA, TMA: NOT DETECTED

## 2021-09-21 NOTE — Telephone Encounter (Signed)
[facility] Patient Advocate Encounter ?  ?Received notification from [facility] that prior authorization for Micheal Leonard is required. ?  ?PA submitted on 09/21/2021 ?Faxed labs & chart notes to [phone] ?Telephone # [phone] ?Status is pending ?   ?[facility] will continue to follow. ? ? ?Clearance Coots, CPhT ?Specialty Pharmacy Patient Advocate ?[facility] ?Phone: [phone] ?Fax:  [phone]  ?

## 2021-09-24 ENCOUNTER — Telehealth: Payer: Self-pay

## 2021-09-24 DIAGNOSIS — I10 Essential (primary) hypertension: Secondary | ICD-10-CM

## 2021-09-24 DIAGNOSIS — B20 Human immunodeficiency virus [HIV] disease: Secondary | ICD-10-CM

## 2021-09-24 MED ORDER — BICTEGRAVIR-EMTRICITAB-TENOFOV 50-200-25 MG PO TABS: 1.0000 | ORAL_TABLET | Freq: Every day | ORAL | 5 refills | Status: DC

## 2021-09-24 MED ORDER — AMLODIPINE BESYLATE 5 MG PO TABS: 5.0000 mg | ORAL_TABLET | Freq: Every day | ORAL | 11 refills | Status: DC

## 2021-09-24 NOTE — Telephone Encounter (Signed)
Received notification from [facility] that they are unable to fill Biktarvy and amlodipine. Sending to [facility]. ? ?Sandie Ano, RN ? ?

## 2021-10-03 ENCOUNTER — Other Ambulatory Visit: Payer: Self-pay | Admitting: Pharmacist

## 2021-10-03 DIAGNOSIS — B20 Human immunodeficiency virus [HIV] disease: Secondary | ICD-10-CM

## 2021-10-03 MED ORDER — CABOTEGRAVIR & RILPIVIRINE ER 600 & 900 MG/3ML IM SUER: 1.0000 | INTRAMUSCULAR | 5 refills | Status: DC

## 2021-10-03 MED ORDER — CABOTEGRAVIR & RILPIVIRINE ER 600 & 900 MG/3ML IM SUER: 1.0000 | INTRAMUSCULAR | 1 refills | Status: DC

## 2021-10-10 LAB — CBC WITH DIFFERENTIAL/PLATELET
Absolute Monocytes: 350 cells/uL (ref 200–950)
Basophils Absolute: 40 cells/uL (ref 0–200)
Basophils Relative: 1.3 %
Eosinophils Absolute: 180 cells/uL (ref 15–500)
Eosinophils Relative: 5.8 %
HCT: 47.2 % (ref 38.5–50.0)
Hemoglobin: 15.7 g/dL (ref 13.2–17.1)
Lymphs Abs: 1311 cells/uL (ref 850–3900)
MCH: 29.6 pg (ref 27.0–33.0)
MCHC: 33.3 g/dL (ref 32.0–36.0)
MCV: 89.1 fL (ref 80.0–100.0)
MPV: 12.4 fL (ref 7.5–12.5)
Monocytes Relative: 11.3 %
Neutro Abs: 1218 cells/uL — ABNORMAL LOW (ref 1500–7800)
Neutrophils Relative %: 39.3 %
Platelets: 210 10*3/uL (ref 140–400)
RBC: 5.3 10*6/uL (ref 4.20–5.80)
RDW: 13.3 % (ref 11.0–15.0)
Total Lymphocyte: 42.3 %
WBC: 3.1 10*3/uL — ABNORMAL LOW (ref 3.8–10.8)

## 2021-10-10 LAB — COMPLETE METABOLIC PANEL WITH GFR
AG Ratio: 1 (calc) (ref 1.0–2.5)
ALT: 30 U/L (ref 9–46)
AST: 32 U/L (ref 10–40)
Albumin: 4.7 g/dL (ref 3.6–5.1)
Alkaline phosphatase (APISO): 86 U/L (ref 36–130)
BUN: 8 mg/dL (ref 7–25)
CO2: 26 mmol/L (ref 20–32)
Calcium: 9.7 mg/dL (ref 8.6–10.3)
Chloride: 105 mmol/L (ref 98–110)
Creat: 0.99 mg/dL (ref 0.60–1.24)
Globulin: 4.7 g/dL (calc) — ABNORMAL HIGH (ref 1.9–3.7)
Glucose, Bld: 94 mg/dL (ref 65–99)
Potassium: 4.3 mmol/L (ref 3.5–5.3)
Sodium: 139 mmol/L (ref 135–146)
Total Bilirubin: 0.6 mg/dL (ref 0.2–1.2)
Total Protein: 9.4 g/dL — ABNORMAL HIGH (ref 6.1–8.1)
eGFR: 109 mL/min/{1.73_m2} (ref >=60)

## 2021-10-10 LAB — FLUORESCENT TREPONEMAL AB(FTA)-IGG-BLD: Fluorescent Treponemal ABS: REACTIVE — AB

## 2021-10-10 LAB — HIV-1 INTEGRASE GENOTYPE

## 2021-10-10 LAB — HIV RNA, RTPCR W/R GT (RTI, PI,INT)
HIV 1 RNA Quant: 49300 copies/mL — ABNORMAL HIGH
HIV-1 RNA Quant, Log: 4.69 Log copies/mL — ABNORMAL HIGH

## 2021-10-10 LAB — T-HELPER CELLS (CD4) COUNT (NOT AT ARMC)
Absolute CD4: 267 cells/uL — ABNORMAL LOW (ref 490–1740)
CD4 T Helper %: 19 % — ABNORMAL LOW (ref 30–61)
Total lymphocyte count: 1430 cells/uL (ref 850–3900)

## 2021-10-10 LAB — HIV-1 GENOTYPE: HIV-1 Genotype: DETECTED — AB

## 2021-10-10 LAB — RPR TITER: RPR Titer: 1:4 {titer} — ABNORMAL HIGH

## 2021-10-10 LAB — RPR: RPR Ser Ql: REACTIVE — AB

## 2021-10-16 ENCOUNTER — Other Ambulatory Visit (HOSPITAL_COMMUNITY): Payer: Self-pay

## 2021-10-18 ENCOUNTER — Other Ambulatory Visit (HOSPITAL_COMMUNITY): Payer: Self-pay

## 2021-10-26 ENCOUNTER — Other Ambulatory Visit (HOSPITAL_COMMUNITY): Payer: Self-pay

## 2021-10-26 ENCOUNTER — Ambulatory Visit: Payer: 59 | Admitting: Infectious Disease

## 2021-10-29 ENCOUNTER — Other Ambulatory Visit (HOSPITAL_COMMUNITY): Payer: Self-pay

## 2021-10-31 ENCOUNTER — Other Ambulatory Visit: Payer: Self-pay

## 2021-10-31 ENCOUNTER — Ambulatory Visit: Payer: 59 | Admitting: Infectious Disease

## 2021-10-31 ENCOUNTER — Encounter: Payer: Self-pay | Admitting: Infectious Disease

## 2021-10-31 ENCOUNTER — Other Ambulatory Visit (HOSPITAL_COMMUNITY): Payer: Self-pay

## 2021-10-31 VITALS — BP 166/104 | HR 97 | Temp 98.1°F | Resp 16 | Ht 72.0 in | Wt 208.8 lb

## 2021-10-31 DIAGNOSIS — A539 Syphilis, unspecified: Secondary | ICD-10-CM

## 2021-10-31 DIAGNOSIS — B20 Human immunodeficiency virus [HIV] disease: Secondary | ICD-10-CM

## 2021-10-31 DIAGNOSIS — Z91199 Patient's noncompliance with other medical treatment and regimen due to unspecified reason: Secondary | ICD-10-CM

## 2021-10-31 DIAGNOSIS — F819 Developmental disorder of scholastic skills, unspecified: Secondary | ICD-10-CM | POA: Diagnosis not present

## 2021-10-31 DIAGNOSIS — I10 Essential (primary) hypertension: Secondary | ICD-10-CM

## 2021-10-31 MED ORDER — BICTEGRAVIR-EMTRICITAB-TENOFOV 50-200-25 MG PO TABS
1.0000 | ORAL_TABLET | Freq: Every day | ORAL | 5 refills | Status: DC
Start: 2021-10-31 — End: 2022-01-09
  Filled 2021-10-31: qty 30, 30d supply, fill #0

## 2021-10-31 MED ORDER — AMLODIPINE BESYLATE 5 MG PO TABS
5.0000 mg | ORAL_TABLET | Freq: Every day | ORAL | 11 refills | Status: DC
  Filled 2021-10-31: qty 30, 30d supply, fill #0
  Filled 2022-03-31: qty 30, 30d supply, fill #1
  Filled 2022-07-04 (×2): qty 30, 30d supply, fill #2

## 2021-10-31 NOTE — Progress Notes (Signed)
? ?Subjective:  ?Chief complaint: Follow-up for HIV disease still not adherent to his oral therapy ? Patient ID: Micheal Leonard, male    DOB: March 09, 1996, 26 y.o.   MRN: [id number] ? ?HPI ? ? ?Micheal Leonard is a 26 year old African-American man living with HIV who was enrolled in our [place] study via ACTG at unfortunately could not maintain virological suppression and no longer in the study.  Hehas been in the hepatitis B study through the ACT [facility]. ? ?He unfortunately did not do well then on the [place] study and ultimately was not doing well with his adherence to [facility].  I changed him to [facility] at 1 point his viral load became undetectable. ? ? ?He was accompanied by his biological father who drove 2 hours to make sure that they could come to his last appointment.  Leopold is living with his mother currently. ? ?They fill his pillboxes with medications but is really difficult for them to ensure that he does take the [facility]. ? ?Both Orvis and his father voiced desire for long-acting therapy since we could be able to give it in a directly observed manner and they feel confident that he could come to appointments. ? ?We have endeavored to procure COVID Cabenuva for him to give it to him off label but initially insurance did not cover it the father is telling me that he got a letter from [place] healthcare saying that it is now covered though is not clear if it is covered under medical or pharmacy benefits. ? ? ? ? ?Past Medical History:  ?Diagnosis Date  ? Cardiac anomaly, congenital   ? Diarrhea 01/22/2017  ? Dizziness 05/30/2020  ? EBV infection   ? hx/notes 10/23/2016  ? Erectile dysfunction 05/30/2020  ? Gonorrhea 07/20/2019  ? Hemoptysis 10/23/2016  ? [name] 10/23/2016  ? HIV (human immunodeficiency virus infection) ([place])   ? Hypertension   ? Hypertension 04/30/2021  ? Migraine   ? "a few/year now; frequency is less than in the past" (10/23/2016)  ? Nonadherence to medical treatment 09/08/2019  ? Pharyngitis 10/23/2016   ? notes 10/23/2016  ? Pulmonary embolism ([place]) 01/29/2016  ? S/P bidirectional [place] Post shunt   ? hx/notes 10/23/2016  ? S/P Fontan procedure   ? hx/notes 10/23/2016  ? Syphilis 07/20/2019  ? Tricuspid atresia and stenosis, congenital   ? ? ?Past Surgical History:  ?Procedure Laterality Date  ? CARDIAC CATHETERIZATION  01/2016  ? at [facility]; hx/notes 10/23/2016  ? CARDIAC SURGERY  01/1997; 2000  ? fontane procedure at [facility] as a child   ? ? ?Family History  ?Problem Relation Age of Onset  ? Hypertension Father   ? ? ?  ?Social History  ? ?Socioeconomic History  ? Marital status: Significant Other  ?  Spouse name: Not on file  ? Number of children: Not on file  ? Years of education: Not on file  ? Highest education level: Not on file  ?Occupational History  ? Not on file  ?Tobacco Use  ? Smoking status: Never  ? Smokeless tobacco: Never  ?Vaping Use  ? Vaping Use: Never used  ?Substance and Sexual Activity  ? Alcohol use: Not Currently  ?  Comment: 2 GLASSES A WEEK  ? Drug use: Not Currently  ?  Frequency: 2.0 times per week  ?  Types: Marijuana  ? Sexual activity: Yes  ?  Partners: Male  ?  Birth control/protection: Condom  ?  Comment: declined condoms  ?Other Topics Concern  ? Not  on file  ?Social History Narrative  ? Not on file  ? ?Social Determinants of Health  ? ?Financial Resource Strain: Not on file  ?Food Insecurity: Not on file  ?Transportation Needs: Not on file  ?Physical Activity: Not on file  ?Stress: Not on file  ?Social Connections: Not on file  ? ? ?Allergies  ?Allergen Reactions  ? Other   ?  An HIV drug and can not remember  exactly.  ? ? ? ?Current Outpatient Medications:  ?  amLODipine (NORVASC) 5 MG tablet, Take 1 tablet (5 mg total) by mouth daily., Disp: 30 tablet, Rfl: 11 ?  bictegravir-emtricitabine-tenofovir AF (BIKTARVY) 50-200-25 MG TABS tablet, TAKE 1 TABLET BY MOUTH DAILY., Disp: 30 tablet, Rfl: 5 ?  cabotegravir & rilpivirine ER (CABENUVA) 600 & 900 MG/3ML injection, Inject 1 kit into the muscle  every 30 (thirty) days., Disp: 6 mL, Rfl: 1 ?  cabotegravir & rilpivirine ER (CABENUVA) 600 & 900 MG/3ML injection, Inject 1 kit into the muscle every 2 (two) months., Disp: 6 mL, Rfl: 5 ?  doxycycline (VIBRA-TABS) 100 MG tablet, Take 1 tablet by mouth 2 (two) times daily. (Patient not taking: Reported on 09/20/2021), Disp: 28 tablet, Rfl: 0 ?  fexofenadine (ALLEGRA) 180 MG tablet, Take 180 mg by mouth daily. (Patient not taking: Reported on 10/31/2021), Disp: , Rfl:  ? ? ? ?Review of Systems  ?Constitutional:  Negative for activity change, appetite change, chills, diaphoresis, fatigue, fever and unexpected weight change.  ?HENT:  Negative for congestion, rhinorrhea, sinus pressure, sneezing, sore throat and trouble swallowing.   ?Eyes:  Negative for photophobia and visual disturbance.  ?Respiratory:  Negative for cough, chest tightness, shortness of breath, wheezing and stridor.   ?Cardiovascular:  Negative for chest pain, palpitations and leg swelling.  ?Gastrointestinal:  Negative for abdominal distention, abdominal pain, anal bleeding, blood in stool, constipation, diarrhea, nausea and vomiting.  ?Genitourinary:  Negative for difficulty urinating, dysuria, flank pain and hematuria.  ?Musculoskeletal:  Negative for arthralgias, back pain, gait problem, joint swelling and myalgias.  ?Skin:  Positive for rash. Negative for color change, pallor and wound.  ?Neurological:  Negative for dizziness, tremors, weakness and light-headedness.  ?Hematological:  Negative for adenopathy. Does not bruise/bleed easily.  ?Psychiatric/Behavioral:  Negative for agitation, behavioral problems, confusion, decreased concentration, dysphoric mood and sleep disturbance.   ? ?   ?Objective:  ? Physical Exam ?Constitutional:   ?   Appearance: He is well-developed.  ?HENT:  ?   Head: Normocephalic and atraumatic.  ?Eyes:  ?   Conjunctiva/sclera: Conjunctivae normal.  ?Cardiovascular:  ?   Rate and Rhythm: Normal rate and regular rhythm.   ?Pulmonary:  ?   Effort: Pulmonary effort is normal. No respiratory distress.  ?   Breath sounds: No wheezing.  ?Abdominal:  ?   General: There is no distension.  ?   Palpations: Abdomen is soft.  ?Musculoskeletal:     ?   General: No tenderness. Normal range of motion.  ?   Cervical back: Normal range of motion and neck supple.  ?Skin: ?   General: Skin is warm and dry.  ?   Coloration: Skin is not pale.  ?   Findings: No erythema or rash.  ?Neurological:  ?   General: No focal deficit present.  ?   Mental Status: He is alert and oriented to person, place, and time.  ?Psychiatric:     ?   Attention and Perception: Attention normal.     ?  Mood and Affect: Mood normal.     ?   Speech: Speech normal.     ?   Behavior: Behavior normal.     ?   Thought Content: Thought content normal.     ?   Judgment: Judgment normal.  ? ? ? ?   ?Assessment & Plan:  ?HIV disease: ? ?We will try to make sure that he can get Cabenuva off label and plan on giving to him monthly for the first few months if not half year prior to going to every 2 month therapy. ? ?In the interim I would like him to do his best to take his Biktarvy and I sent a new prescription for that. ? ?I will check a repeat viral load today and CD4 count. ? ?History of syphilis and rechecking RPR ? ?HTN : Apparently did not get his amlodipine which was sent to a mail order pharmacy but I am now sending it to [facility] so his father can pick it up before he leaves town. ? ?Transportation issues dad will have a hard time bring him to every single appointments particular next week if he gets Cabenuva injection then.  Average had to [place] agree with bridge counseling who can work with the patient ? 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 ? ? ?

## 2021-11-01 ENCOUNTER — Telehealth: Payer: Self-pay

## 2021-11-01 ENCOUNTER — Other Ambulatory Visit (HOSPITAL_COMMUNITY): Payer: Self-pay

## 2021-11-01 NOTE — Telephone Encounter (Signed)
[facility] Patient Advocate Encounter ? ?Prior Authorization for Micheal Leonard has been approved.  (Medical Benefits) ? ?PA# [id number] ?Effective dates: 09/24/21 through 09/25/22 ? ?Patient is Enrolled in [employer] Claims ? ? ? ? ? ? ?[facility] will continue to follow. ? ?Clearance Coots, CPhT ?Specialty Pharmacy Patient Advocate ?[facility] ?Phone: [phone] ?Fax:  [phone]  ?

## 2021-11-03 LAB — HELPER T-LYMPH-CD4 (ARMC ONLY)
% CD 4 Pos. Lymph.: 28.6 % — ABNORMAL LOW (ref 30.8–58.5)
Absolute CD 4 Helper: 343 /uL — ABNORMAL LOW (ref 359–1519)
Basophils Absolute: 0 10*3/uL (ref 0.0–0.2)
Basos: 1 %
EOS (ABSOLUTE): 0.3 10*3/uL (ref 0.0–0.4)
Eos: 9 %
Hematocrit: 48.1 % (ref 37.5–51.0)
Hemoglobin: 15.6 g/dL (ref 13.0–17.7)
Immature Grans (Abs): 0 10*3/uL (ref 0.0–0.1)
Immature Granulocytes: 0 %
Lymphocytes Absolute: 1.2 10*3/uL (ref 0.7–3.1)
Lymphs: 33 %
MCH: 29.5 pg (ref 26.6–33.0)
MCHC: 32.4 g/dL (ref 31.5–35.7)
MCV: 91 fL (ref 79–97)
Monocytes Absolute: 0.5 10*3/uL (ref 0.1–0.9)
Monocytes: 15 %
Neutrophils Absolute: 1.6 10*3/uL (ref 1.4–7.0)
Neutrophils: 42 %
Platelets: 213 10*3/uL (ref 150–450)
RBC: 5.29 x10E6/uL (ref 4.14–5.80)
RDW: 13.6 % (ref 11.6–15.4)
WBC: 3.7 10*3/uL (ref 3.4–10.8)

## 2021-11-06 ENCOUNTER — Other Ambulatory Visit (HOSPITAL_COMMUNITY): Payer: Self-pay

## 2021-11-06 LAB — T-HELPER CELLS (CD4) COUNT (NOT AT ARMC)

## 2021-11-08 ENCOUNTER — Other Ambulatory Visit: Payer: Self-pay

## 2021-11-08 ENCOUNTER — Ambulatory Visit (INDEPENDENT_AMBULATORY_CARE_PROVIDER_SITE_OTHER): Payer: 59 | Admitting: Pharmacist

## 2021-11-08 DIAGNOSIS — B20 Human immunodeficiency virus [HIV] disease: Secondary | ICD-10-CM | POA: Diagnosis not present

## 2021-11-08 MED ORDER — CABOTEGRAVIR & RILPIVIRINE ER 600 & 900 MG/3ML IM SUER
1.0000 | Freq: Once | INTRAMUSCULAR | Status: AC
  Administered 2021-11-08: 1 via INTRAMUSCULAR

## 2021-11-08 NOTE — Progress Notes (Signed)
? ?HPI: Micheal Leonard is a 26 y.o. male who presents to the [facility] for [place] administration. ? ?Patient Active Problem List  ? Diagnosis Date Noted  ? Hypertension 04/30/2021  ? Dizziness 05/30/2020  ? Erectile dysfunction 05/30/2020  ? Nonadherence to medical treatment 09/08/2019  ? Syphilis 07/20/2019  ? Gonorrhea 07/20/2019  ? Diarrhea 01/22/2017  ? Bleeding gums 11/02/2016  ? Sinus tachycardia 11/02/2016  ? Cognitive developmental delay 10/25/2016  ? Pulmonary embolism ([place]) 10/24/2016  ? 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 Chlamydial infection of pharynx 10/23/2016  ? 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 Proteinuria 10/23/2016  ? ? ?Patient's Medications  ?New Prescriptions  ? No medications on file  ?Previous Medications  ? AMLODIPINE (NORVASC) 5 MG TABLET    Take 1 tablet (5 mg total) by mouth daily.  ? BICTEGRAVIR-EMTRICITABINE-TENOFOVIR AF (BIKTARVY) 50-200-25 MG TABS TABLET    TAKE 1 TABLET BY MOUTH DAILY.  ? CABOTEGRAVIR & RILPIVIRINE ER (CABENUVA) 600 & 900 MG/3ML INJECTION    Inject 1 kit into the muscle every 30 (thirty) days.  ? CABOTEGRAVIR & RILPIVIRINE ER (CABENUVA) 600 & 900 MG/3ML INJECTION    Inject 1 kit into the muscle every 2 (two) months.  ? DOXYCYCLINE (VIBRA-TABS) 100 MG TABLET    Take 1 tablet by mouth 2 (two) times daily.  ? FEXOFENADINE (ALLEGRA) 180 MG TABLET    Take 180 mg by mouth daily.  ?Modified Medications  ? No medications on file  ?Discontinued Medications  ? No medications on file  ? ? ?Allergies: ?Allergies  ?Allergen Reactions  ? Other   ?  An HIV drug and can not remember  exactly.  ? ? ?Past Medical History: ?Past Medical History:  ?Diagnosis Date  ? Cardiac anomaly, congenital   ? Diarrhea 01/22/2017  ? Dizziness 05/30/2020  ? EBV infection   ? hx/notes 10/23/2016  ? Erectile dysfunction 05/30/2020  ? Gonorrhea 07/20/2019  ? Hemoptysis 10/23/2016  ? [name]  10/23/2016  ? HIV (human immunodeficiency virus infection) ([place])   ? Hypertension   ? Hypertension 04/30/2021  ? Migraine   ? "a few/year now; frequency is less than in the past" (10/23/2016)  ? Nonadherence to medical treatment 09/08/2019  ? Pharyngitis 10/23/2016  ? notes 10/23/2016  ? Pulmonary embolism ([place]) 01/29/2016  ? S/P bidirectional Eulas Post shunt   ? hx/notes 10/23/2016  ? S/P Fontan procedure   ? hx/notes 10/23/2016  ? Syphilis 07/20/2019  ? Tricuspid atresia and stenosis, congenital   ? ? ?Social History: ?Social History  ? ?Socioeconomic History  ? Marital status: Significant Other  ?  Spouse name: Not on file  ? Number of children: Not on file  ? Years of education: Not on file  ? Highest education level: Not on file  ?Occupational History  ? Not on file  ?Tobacco Use  ? Smoking status: Never  ? Smokeless tobacco: Never  ?Vaping Use  ? Vaping Use: Never used  ?Substance and Sexual Activity  ? Alcohol use: Not Currently  ?  Comment: 2 GLASSES A WEEK  ? Drug use: Not Currently  ?  Frequency: 2.0 times per week  ?  Types: Marijuana  ? Sexual activity: Yes  ?  Partners: Male  ?  Birth control/protection: Condom  ?  Comment: declined condoms  ?Other Topics Concern  ? Not on file  ?Social History Narrative  ? Not on  file  ? ?Social Determinants of Health  ? ?Financial Resource Strain: Not on file  ?Food Insecurity: Not on file  ?Transportation Needs: Not on file  ?Physical Activity: Not on file  ?Stress: Not on file  ?Social Connections: Not on file  ? ? ?Labs: ?Lab Results  ?Component Value Date  ? HIV1RNAQUANT 4,850 (H) 10/31/2021  ? HIV1RNAQUANT 49,300 (H) 09/20/2021  ? HIV1RNAQUANT 9,280 (H) 04/30/2021  ? CD4TABS 305 (L) 04/30/2021  ? CD4TABS 213 09/19/2020  ? CD4TABS 194 (L) 08/24/2020  ? ? ?RPR and STI ?Lab Results  ?Component Value Date  ? LABRPR REACTIVE (A) 10/31/2021  ? LABRPR REACTIVE (A) 09/20/2021  ? LABRPR REACTIVE (A) 04/30/2021  ? LABRPR REACTIVE (A) 11/24/2020  ? LABRPR REACTIVE (A) 06/07/2020  ?  RPRTITER 1:4 (H) 10/31/2021  ? RPRTITER 1:4 (H) 09/20/2021  ? RPRTITER 1:16 (H) 04/30/2021  ? RPRTITER 1:128 (H) 11/24/2020  ? RPRTITER 1:4 (H) 06/07/2020  ? ? ?STI Results GC CT  ?04/30/2021 ?11:11 AM Negative    ? Negative    ? Negative   Negative    ? Positive    ? Negative    ?11/24/2020 ?10:57 AM Negative    ? Negative    ? Negative   Negative    ? Negative    ? Negative    ?08/24/2020 ? 8:42 AM Negative    ? Negative    ? Negative   Negative    ? Negative    ? Negative    ?06/07/2020 ?11:16 AM Negative    ? Negative    ? Negative   Negative    ? Negative    ? Negative    ?11/02/2019 ? 1:46 PM Negative   Negative    ?06/16/2019 ? 1:38 PM Negative    ? Positive    ? Negative   Negative    ? Positive    ? Negative    ?12/24/2018 ?12:00 AM Negative    ? Negative    ? Negative   Negative    ? Negative    ? Negative    ?07/17/2018 ?12:00 AM **POSITIVE**    ? Negative    ? Negative   **POSITIVE**    ? Negative    ? Negative    ?04/30/2018 ?12:00 AM Negative   Negative    ?01/23/2018 ?12:00 AM Negative    ? Negative   Negative    ? Negative    ?11/27/2017 ?12:00 AM Negative    ? Negative    ? Negative   Negative    ? Negative    ? Negative    ?09/07/2017 ?12:00 AM **POSITIVE**   Negative    ?05/05/2017 ?12:00 AM Negative   **POSITIVE**    ?01/22/2017 ?12:00 AM Negative    ? Negative    ? Negative   Negative    ? Negative    ? Negative    ?10/25/2016 ?12:00 AM Negative   **POSITIVE**    ?10/24/2016 ?12:00 AM Negative    ? Negative   Negative    ? Negative    ? ? ?Hepatitis B ?Lab Results  ?Component Value Date  ? HEPBSAB NON-REACTIVE 01/25/2020  ? HEPBSAG NON-REACTIVE 01/25/2020  ? HEPBCAB NON-REACTIVE 01/25/2020  ? ?Hepatitis C ?Lab Results  ?Component Value Date  ? HEPCAB NON-REACTIVE 07/02/2019  ? ?Hepatitis A ?Lab Results  ?Component Value Date  ? HAV Positive (A) 10/25/2016  ? ?Lipids: ?Lab Results  ?Component Value Date  ?  CHOL 189 01/21/2020  ? TRIG 106 01/21/2020  ? HDL 42 01/21/2020  ? CHOLHDL 4.5 01/21/2020  ? VLDL 17  01/22/2017  ? LDLCALC 126 (H) 01/21/2020  ? ? ?Current HIV Regimen: ?Biktarvy ? ?TARGET DATE: ?27th  ? ?Assessment: ?Shayden presents today for their first initiation injection for Cabenuva. Madsen will be started on injectable q54monthCabenuva for off-label use for his HIV treatment.Will administer loading dose of cabotegravir 6038m3mL and rilpivirine 90097mmL today. HIV RNA was 4,850 on 10/31/21. Will not get labs today, but patient will need lipid panel and Hep B immunity checked at next visit.  ? ?Patient is currently employed at [employer] two days off are Wednesday/Thursdays, so future appointments will be scheduled on these days. MitKarn Pickler currently working with patient with getting him to appointments.  ? ?Counseled that CabK[place] Reap two separate intramuscular injections in the gluteal muscle on each side for each visit. Explained that injections are every month. Discussed the need for viral load monitoring every 1 month for the first 6 months and then periodically afterwards as their provider sees the need. Explained that showing up to injection appointments is very important and warned that if 2 appointments are missed, it will be reassessed by their provider whether they are a good candidate for injection therapy. Counseled on possible side effects associated with the injections such as injection site pain, which is usually mild to moderate in nature, injection site nodules, and injection site reactions. Asked to call the clinic or send me a mychart message if they experience any issues, such as fatigue, nausea, headache, rash, or dizziness. Advised that they can take ibuprofen or tylenol for injection site pain if needed.  ? ?Administered cabotegravir 600m35mL in left upper outer quadrant of the gluteal muscle. Administered rilpivirine 900 mg/3mL 38mthe right upper outer quadrant of the gluteal muscle. Monitored patient for 10 minutes after injection. Injections were tolerated well without issue.  Counseled to stop taking Biktarvy after today's dose and to call with any issues that may arise. Will make follow up appointments for maintenance injections every 1 month.  ? ?Plan: ?- Stop Biktarvy after today's

## 2021-11-12 ENCOUNTER — Other Ambulatory Visit (HOSPITAL_COMMUNITY): Payer: Self-pay

## 2021-11-14 LAB — CBC WITH DIFFERENTIAL/PLATELET
Absolute Monocytes: 555 cells/uL (ref 200–950)
Basophils Absolute: 41 cells/uL (ref 0–200)
Basophils Relative: 1.1 %
Eosinophils Absolute: 318 cells/uL (ref 15–500)
Eosinophils Relative: 8.6 %
HCT: 46.5 % (ref 38.5–50.0)
Hemoglobin: 15.4 g/dL (ref 13.2–17.1)
Lymphs Abs: 1228 cells/uL (ref 850–3900)
MCH: 29.2 pg (ref 27.0–33.0)
MCHC: 33.1 g/dL (ref 32.0–36.0)
MCV: 88.1 fL (ref 80.0–100.0)
MPV: 12.4 fL (ref 7.5–12.5)
Monocytes Relative: 15 %
Neutro Abs: 1558 cells/uL (ref 1500–7800)
Neutrophils Relative %: 42.1 %
Platelets: 200 10*3/uL (ref 140–400)
RBC: 5.28 10*6/uL (ref 4.20–5.80)
RDW: 13.9 % (ref 11.0–15.0)
Total Lymphocyte: 33.2 %
WBC: 3.7 10*3/uL — ABNORMAL LOW (ref 3.8–10.8)

## 2021-11-14 LAB — FLUORESCENT TREPONEMAL AB(FTA)-IGG-BLD: Fluorescent Treponemal ABS: REACTIVE — AB

## 2021-11-14 LAB — COMPLETE METABOLIC PANEL WITH GFR
AG Ratio: 0.9 (calc) — ABNORMAL LOW (ref 1.0–2.5)
ALT: 24 U/L (ref 9–46)
AST: 35 U/L (ref 10–40)
Albumin: 4.3 g/dL (ref 3.6–5.1)
Alkaline phosphatase (APISO): 79 U/L (ref 36–130)
BUN/Creatinine Ratio: 7 (calc) (ref 6–22)
BUN: 6 mg/dL — ABNORMAL LOW (ref 7–25)
CO2: 25 mmol/L (ref 20–32)
Calcium: 9 mg/dL (ref 8.6–10.3)
Chloride: 104 mmol/L (ref 98–110)
Creat: 0.89 mg/dL (ref 0.60–1.24)
Globulin: 4.8 g/dL (calc) — ABNORMAL HIGH (ref 1.9–3.7)
Glucose, Bld: 83 mg/dL (ref 65–99)
Potassium: 3.8 mmol/L (ref 3.5–5.3)
Sodium: 136 mmol/L (ref 135–146)
Total Bilirubin: 0.7 mg/dL (ref 0.2–1.2)
Total Protein: 9.1 g/dL — ABNORMAL HIGH (ref 6.1–8.1)
eGFR: 123 mL/min/{1.73_m2} (ref >=60)

## 2021-11-14 LAB — HIV-1 INTEGRASE GENOTYPE

## 2021-11-14 LAB — HIV RNA, RTPCR W/R GT (RTI, PI,INT)
HIV 1 RNA Quant: 4850 copies/mL — ABNORMAL HIGH
HIV-1 RNA Quant, Log: 3.69 Log copies/mL — ABNORMAL HIGH

## 2021-11-14 LAB — HIV-1 GENOTYPE: HIV-1 Genotype: DETECTED — AB

## 2021-11-14 LAB — RPR TITER: RPR Titer: 1:4 {titer} — ABNORMAL HIGH

## 2021-11-14 LAB — RPR: RPR Ser Ql: REACTIVE — AB

## 2021-11-19 ENCOUNTER — Other Ambulatory Visit (HOSPITAL_COMMUNITY): Payer: Self-pay

## 2021-11-22 ENCOUNTER — Other Ambulatory Visit (HOSPITAL_COMMUNITY): Payer: Self-pay

## 2021-12-05 ENCOUNTER — Ambulatory Visit (INDEPENDENT_AMBULATORY_CARE_PROVIDER_SITE_OTHER): Payer: 59 | Admitting: Pharmacist

## 2021-12-05 ENCOUNTER — Other Ambulatory Visit: Payer: Self-pay

## 2021-12-05 DIAGNOSIS — B20 Human immunodeficiency virus [HIV] disease: Secondary | ICD-10-CM

## 2021-12-05 MED ORDER — CABOTEGRAVIR & RILPIVIRINE ER 400 & 600 MG/2ML IM SUER
1.0000 | Freq: Once | INTRAMUSCULAR | Status: AC
  Administered 2021-12-05: 1 via INTRAMUSCULAR

## 2021-12-05 NOTE — Progress Notes (Signed)
HPI: Micheal Leonard is a 26 y.o. male who presents to the [facility] clinic for [place] administration.  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   BICTEGRAVIR-EMTRICITABINE-TENOFOVIR AF (BIKTARVY) 50-200-25 MG TABS TABLET    TAKE 1 TABLET BY MOUTH DAILY.   CABOTEGRAVIR & RILPIVIRINE ER (CABENUVA) 600 & 900 MG/3ML INJECTION    Inject 1 kit into the muscle every 30 (thirty) days.   CABOTEGRAVIR & RILPIVIRINE ER (CABENUVA) 600 & 900 MG/3ML INJECTION    Inject 1 kit into the muscle every 2 (two) months.   DOXYCYCLINE (VIBRA-TABS) 100 MG TABLET    Take 1 tablet by mouth 2 (two) times daily.   FEXOFENADINE (ALLEGRA) 180 MG TABLET    Take 180 mg by mouth daily.  Modified Medications   No medications on file  Discontinued Medications   No medications on file    Allergies: Allergies  Allergen Reactions   Other     An HIV drug and can not remember  exactly.    Past Medical History: Past Medical History:  Diagnosis Date   Cardiac anomaly, congenital    Diarrhea 01/22/2017   Dizziness 05/30/2020   EBV infection    hx/notes 10/23/2016   Erectile dysfunction 05/30/2020   Gonorrhea 07/20/2019   Hemoptysis 10/23/2016   [name]  10/23/2016   HIV (human immunodeficiency virus infection) ([place])    Hypertension    Hypertension 04/30/2021   Migraine    "a few/year now; frequency is less than in the past" (10/23/2016)   Nonadherence to medical treatment 09/08/2019   Pharyngitis 10/23/2016   notes 10/23/2016   Pulmonary embolism ([place]) 01/29/2016   S/P bidirectional Glenn shunt    hx/notes 10/23/2016   S/P Fontan procedure    hx/notes 10/23/2016   Syphilis 07/20/2019   Tricuspid atresia and stenosis, congenital     Social History: Social History   Socioeconomic History   Marital status: Significant Other    Spouse name: Not on file   Number of children: Not on file   Years of education: Not on file   Highest education level: Not on file  Occupational History   Not on file  Tobacco Use   Smoking status: Never   Smokeless tobacco: Never  Vaping Use   Vaping Use: Never used  Substance and Sexual Activity   Alcohol use: Not Currently    Comment: 2 GLASSES A WEEK   Drug use: Not Currently    Frequency: 2.0 times per week    Types: Marijuana   Sexual activity: Yes    Partners: Male    Birth control/protection: Condom    Comment: declined condoms  Other Topics Concern   Not on file  Social History Narrative   Not on  file   Social Determinants of Health   Financial Resource Strain: Not on file  Food Insecurity: Not on file  Transportation Needs: Not on file  Physical Activity: Not on file  Stress: Not on file  Social Connections: Not on file    Labs: Lab Results  Component Value Date   HIV1RNAQUANT 4,850 (H) 10/31/2021   HIV1RNAQUANT 49,300 (H) 09/20/2021   HIV1RNAQUANT 9,280 (H) 04/30/2021   CD4TABS 305 (L) 04/30/2021   CD4TABS 213 09/19/2020   CD4TABS 194 (L) 08/24/2020    RPR and STI Lab Results  Component Value Date   LABRPR REACTIVE (A) 10/31/2021   LABRPR REACTIVE (A) 09/20/2021   LABRPR REACTIVE (A) 04/30/2021   LABRPR REACTIVE (A) 11/24/2020   LABRPR REACTIVE (A) 06/07/2020    RPRTITER 1:4 (H) 10/31/2021   RPRTITER 1:4 (H) 09/20/2021   RPRTITER 1:16 (H) 04/30/2021   RPRTITER 1:128 (H) 11/24/2020   RPRTITER 1:4 (H) 06/07/2020    STI Results GC CT  04/30/2021 11:11 AM Negative     Negative     Negative   Negative     Positive     Negative    11/24/2020 10:57 AM Negative     Negative     Negative   Negative     Negative     Negative    08/24/2020  8:42 AM Negative     Negative     Negative   Negative     Negative     Negative    06/07/2020 11:16 AM Negative     Negative     Negative   Negative     Negative     Negative    11/02/2019  1:46 PM Negative   Negative    06/16/2019  1:38 PM Negative     Positive     Negative   Negative     Positive     Negative    12/24/2018 12:00 AM Negative     Negative     Negative   Negative     Negative     Negative    07/17/2018 12:00 AM **POSITIVE**     Negative     Negative   **POSITIVE**     Negative     Negative    04/30/2018 12:00 AM Negative   Negative    01/23/2018 12:00 AM Negative     Negative   Negative     Negative    11/27/2017 12:00 AM Negative     Negative     Negative   Negative     Negative     Negative    09/07/2017 12:00 AM **POSITIVE**   Negative    05/05/2017 12:00 AM Negative   **POSITIVE**    01/22/2017 12:00 AM Negative     Negative     Negative   Negative     Negative     Negative    10/25/2016 12:00 AM Negative   **POSITIVE**    10/24/2016 12:00 AM Negative     Negative   Negative     Negative      Hepatitis B Lab Results  Component Value Date   HEPBSAB NON-REACTIVE 01/25/2020   HEPBSAG NON-REACTIVE 01/25/2020   HEPBCAB NON-REACTIVE 01/25/2020   Hepatitis C Lab Results  Component Value Date   HEPCAB NON-REACTIVE 07/02/2019   Hepatitis A Lab Results  Component Value Date   HAV Positive (A) 10/25/2016   Lipids: Lab Results  Component Value Date  CHOL 189 01/21/2020   TRIG 106 01/21/2020   HDL 42 01/21/2020   CHOLHDL 4.5 01/21/2020   VLDL 17  01/22/2017   LDLCALC 126 (H) 01/21/2020    TARGET DATE: 27th of the month  Assessment: Isaish presents today for their maintenance Cabenuva injections. Past injections were tolerated well without issues. No problems with systemic side effects of injections. Patient did not experience soreness. He politely declined STI testing since he has been monogamous with his current boyfriend. He has plans to get a tattoo for his birthday and propose to his boyfriend soon. HIV RNA, Hep B Ab, and lipid panel ordered today for monitoring and assessing his immunity status. Of note, he is working with Karn Pickler and his only off days are Wednesday and Thursday.  Administered cabotegravir 663m/3mL in left upper outer quadrant of the gluteal muscle. Administered rilpivirine 900 mg/343min the right upper outer quadrant of the gluteal muscle. Injections were tolerated well. Patient will follow up in 2 months for next set of injections.   Plan: - Cabenuva injections administered - Next injections scheduled for 6/28 with Dr. VaTommy Medalnd 7/26 and 8/23 with Dr. KuEber Hong Call with any issues or questions - F/u HIV RNA, Hep B Ab, and lipid panel for further treatment if necessary   MaVarney DailyPharmD [facility]

## 2021-12-07 LAB — LIPID PANEL
Cholesterol: 179 mg/dL (ref <200)
HDL: 49 mg/dL (ref >=40)
LDL Cholesterol (Calc): 110 mg/dL (calc) — ABNORMAL HIGH
Non-HDL Cholesterol (Calc): 130 mg/dL (calc) — ABNORMAL HIGH (ref <130)
Total CHOL/HDL Ratio: 3.7 (calc) (ref <5.0)
Triglycerides: 98 mg/dL (ref <150)

## 2021-12-07 LAB — HIV-1 RNA QUANT-NO REFLEX-BLD
HIV 1 RNA Quant: 56 copies/mL — ABNORMAL HIGH
HIV-1 RNA Quant, Log: 1.75 Log copies/mL — ABNORMAL HIGH

## 2021-12-07 LAB — HEPATITIS B SURFACE ANTIBODY,QUALITATIVE: Hep B S Ab: REACTIVE — AB

## 2022-01-01 ENCOUNTER — Encounter: Payer: 59 | Admitting: Infectious Disease

## 2022-01-09 ENCOUNTER — Other Ambulatory Visit: Payer: Self-pay

## 2022-01-09 ENCOUNTER — Ambulatory Visit (INDEPENDENT_AMBULATORY_CARE_PROVIDER_SITE_OTHER): Payer: 59 | Admitting: Infectious Disease

## 2022-01-09 ENCOUNTER — Encounter: Payer: Self-pay | Admitting: Infectious Disease

## 2022-01-09 ENCOUNTER — Other Ambulatory Visit (HOSPITAL_COMMUNITY)
Admission: RE | Admit: 2022-01-09 | Discharge: 2022-01-09 | Disposition: A | Discharge status: Home / Self Care | Payer: 59 | Source: Ambulatory Visit | Attending: Infectious Disease | Admitting: Infectious Disease

## 2022-01-09 VITALS — BP 143/86 | HR 73 | Temp 97.9°F | Ht 72.0 in | Wt 211.0 lb

## 2022-01-09 DIAGNOSIS — Z91199 Patient's noncompliance with other medical treatment and regimen due to unspecified reason: Secondary | ICD-10-CM | POA: Diagnosis not present

## 2022-01-09 DIAGNOSIS — B20 Human immunodeficiency virus [HIV] disease: Secondary | ICD-10-CM

## 2022-01-09 DIAGNOSIS — I10 Essential (primary) hypertension: Secondary | ICD-10-CM

## 2022-01-09 DIAGNOSIS — A539 Syphilis, unspecified: Secondary | ICD-10-CM | POA: Diagnosis not present

## 2022-01-09 MED ORDER — CABOTEGRAVIR & RILPIVIRINE ER 400 & 600 MG/2ML IM SUER
1.0000 | Freq: Once | INTRAMUSCULAR | Status: AC
  Administered 2022-01-09: 1 via INTRAMUSCULAR

## 2022-01-09 NOTE — Progress Notes (Signed)
Subjective:  Chief complaint here for Cabenuva injection  Patient ID: Micheal Leonard, male    DOB: Nov 12, 1995, 26 y.o.   MRN: [id number]  HPI  Reason is a 26 year old black man living with HIV who has had trouble with adherence to oral medication and who he enrolled into the latitude study with ACT G but unfortunately had to be withdrawn the study.  Ultimately we decided to give him Cabenuva off label in the context of ongoing viremia but he is responded well to therapy and viral load is come down into the 50s.  He is brought today by [place] from [facility].  He does need to get paystub's match his other meds can continue to see him.  He tells me that his boss has to create an entire new account for him because his username and password no longer work and she has assured him she will get to this when she can.  Appears to still have residual rash from syphilis.    Past Medical History:  Diagnosis Date   Cardiac anomaly, congenital    Diarrhea 01/22/2017   Dizziness 05/30/2020   EBV infection    hx/notes 10/23/2016   Erectile dysfunction 05/30/2020   Gonorrhea 07/20/2019   Hemoptysis 10/23/2016   [name] 10/23/2016   HIV (human immunodeficiency virus infection) ([place])    Hypertension    Hypertension 04/30/2021   Migraine    "a few/year now; frequency is less than in the past" (10/23/2016)   Nonadherence to medical treatment 09/08/2019   Pharyngitis 10/23/2016   notes 10/23/2016   Pulmonary embolism ([place]) 01/29/2016   S/P bidirectional Glenn shunt    hx/notes 10/23/2016   S/P Fontan procedure    hx/notes 10/23/2016   Syphilis 07/20/2019   Tricuspid atresia and stenosis, congenital     Past Surgical History:  Procedure Laterality Date   CARDIAC CATHETERIZATION  01/2016   at [facility]; hx/notes 10/23/2016   CARDIAC SURGERY  01/1997; 2000   fontane procedure at [facility] as a child     Family History  Problem Relation Age of Onset   Hypertension Father       Social  History   Socioeconomic History   Marital status: Significant Other    Spouse name: Not on file   Number of children: Not on file   Years of education: Not on file   Highest education level: Not on file  Occupational History   Not on file  Tobacco Use   Smoking status: Never   Smokeless tobacco: Never  Vaping Use   Vaping Use: Never used  Substance and Sexual Activity   Alcohol use: Not Currently    Comment: 2 GLASSES A WEEK   Drug use: Not Currently    Frequency: 2.0 times per week    Types: Marijuana   Sexual activity: Yes    Partners: Male    Birth control/protection: Condom    Comment: declined condoms  Other Topics Concern   Not on file  Social History Narrative   Not on file   Social Determinants of Health   Financial Resource Strain: Not on file  Food Insecurity: Not on file  Transportation Needs: Not on file  Physical Activity: Not on file  Stress: Not on file  Social Connections: Not on file    Allergies  Allergen Reactions   Other     An HIV drug and can not remember  exactly.     Current Outpatient Medications:    amLODipine (NORVASC)  5 MG tablet, Take 1 tablet (5 mg total) by mouth daily., Disp: 30 tablet, Rfl: 11   bictegravir-emtricitabine-tenofovir AF (BIKTARVY) 50-200-25 MG TABS tablet, TAKE 1 TABLET BY MOUTH DAILY., Disp: 30 tablet, Rfl: 5   cabotegravir & rilpivirine ER (CABENUVA) 600 & 900 MG/3ML injection, Inject 1 kit into the muscle every 30 (thirty) days., Disp: 6 mL, Rfl: 1   cabotegravir & rilpivirine ER (CABENUVA) 600 & 900 MG/3ML injection, Inject 1 kit into the muscle every 2 (two) months., Disp: 6 mL, Rfl: 5   doxycycline (VIBRA-TABS) 100 MG tablet, Take 1 tablet by mouth 2 (two) times daily. (Patient not taking: Reported on 09/20/2021), Disp: 28 tablet, Rfl: 0   fexofenadine (ALLEGRA) 180 MG tablet, Take 180 mg by mouth daily. (Patient not taking: Reported on 10/31/2021), Disp: , Rfl:     Review of Systems  Constitutional:  Negative  for activity change, appetite change, chills, diaphoresis, fatigue, fever and unexpected weight change.  HENT:  Negative for congestion, rhinorrhea, sinus pressure, sneezing, sore throat and trouble swallowing.   Eyes:  Negative for photophobia and visual disturbance.  Respiratory:  Negative for cough, chest tightness, shortness of breath, wheezing and stridor.   Cardiovascular:  Negative for chest pain, palpitations and leg swelling.  Gastrointestinal:  Negative for abdominal distention, abdominal pain, anal bleeding, blood in stool, constipation, diarrhea, nausea and vomiting.  Genitourinary:  Negative for difficulty urinating, dysuria, flank pain and hematuria.  Musculoskeletal:  Negative for arthralgias, back pain, gait problem, joint swelling and myalgias.  Skin:  Positive for rash. Negative for color change, pallor and wound.  Neurological:  Negative for dizziness, tremors, weakness and light-headedness.  Hematological:  Negative for adenopathy. Does not bruise/bleed easily.  Psychiatric/Behavioral:  Negative for agitation, behavioral problems, confusion, decreased concentration, dysphoric mood and sleep disturbance.        Objective:   Physical Exam Constitutional:      Appearance: He is well-developed.  HENT:     Head: Normocephalic and atraumatic.  Eyes:     Conjunctiva/sclera: Conjunctivae normal.  Cardiovascular:     Rate and Rhythm: Normal rate and regular rhythm.  Pulmonary:     Effort: Pulmonary effort is normal. No respiratory distress.     Breath sounds: No wheezing.  Abdominal:     General: There is no distension.     Palpations: Abdomen is soft.  Musculoskeletal:        General: No tenderness. Normal range of motion.     Cervical back: Normal range of motion and neck supple.  Skin:    General: Skin is warm and dry.     Coloration: Skin is not pale.     Findings: No erythema or rash.  Neurological:     General: No focal deficit present.     Mental Status: He  is alert and oriented to person, place, and time.  Psychiatric:        Mood and Affect: Mood normal.        Behavior: Behavior normal.        Thought Content: Thought content normal.        Judgment: Judgment normal.     Rash 01/09/22:             Assessment & Plan:   HIV disease:  I am checking a repeat viral load with reflex to genotype and CD4 count  We are giving him Cabenuva and he will have his next Cabenuva injection in 1 month's time with Cassie.  History  of syphilis: We will check RPR titers appears to have residual rash on this.  STI screening we will screen for gonorrhea chlamydia at  genital and extragenital sites  HTN: continue amlodipine

## 2022-01-09 NOTE — Addendum Note (Signed)
Addended by: Tressa Busman T on: 01/09/2022 09:59 AM   Modules accepted: Orders

## 2022-01-10 LAB — CYTOLOGY, (ORAL, ANAL, URETHRAL) ANCILLARY ONLY
Chlamydia: NEGATIVE
Chlamydia: NEGATIVE
Comment: NEGATIVE
Comment: NEGATIVE
Comment: NORMAL
Comment: NORMAL
Neisseria Gonorrhea: NEGATIVE
Neisseria Gonorrhea: NEGATIVE

## 2022-01-10 LAB — URINE CYTOLOGY ANCILLARY ONLY
Chlamydia: NEGATIVE
Comment: NEGATIVE
Comment: NORMAL
Neisseria Gonorrhea: NEGATIVE

## 2022-01-10 LAB — T-HELPER CELLS (CD4) COUNT (NOT AT ARMC)
CD4 % Helper T Cell: 25 % — ABNORMAL LOW (ref 33–65)
CD4 T Cell Abs: 276 /uL — ABNORMAL LOW (ref 400–1790)

## 2022-01-12 LAB — CBC WITH DIFFERENTIAL/PLATELET
Absolute Monocytes: 307 cells/uL (ref 200–950)
Basophils Absolute: 41 cells/uL (ref 0–200)
Basophils Relative: 1.4 %
Eosinophils Absolute: 189 cells/uL (ref 15–500)
Eosinophils Relative: 6.5 %
HCT: 46.9 % (ref 38.5–50.0)
Hemoglobin: 15.9 g/dL (ref 13.2–17.1)
Lymphs Abs: 1169 cells/uL (ref 850–3900)
MCH: 29.9 pg (ref 27.0–33.0)
MCHC: 33.9 g/dL (ref 32.0–36.0)
MCV: 88.3 fL (ref 80.0–100.0)
MPV: 12.5 fL (ref 7.5–12.5)
Monocytes Relative: 10.6 %
Neutro Abs: 1195 cells/uL — ABNORMAL LOW (ref 1500–7800)
Neutrophils Relative %: 41.2 %
Platelets: 180 10*3/uL (ref 140–400)
RBC: 5.31 10*6/uL (ref 4.20–5.80)
RDW: 13.6 % (ref 11.0–15.0)
Total Lymphocyte: 40.3 %
WBC: 2.9 10*3/uL — ABNORMAL LOW (ref 3.8–10.8)

## 2022-01-12 LAB — COMPLETE METABOLIC PANEL WITH GFR
AG Ratio: 1 (calc) (ref 1.0–2.5)
ALT: 24 U/L (ref 9–46)
AST: 31 U/L (ref 10–40)
Albumin: 4.6 g/dL (ref 3.6–5.1)
Alkaline phosphatase (APISO): 75 U/L (ref 36–130)
BUN: 9 mg/dL (ref 7–25)
CO2: 25 mmol/L (ref 20–32)
Calcium: 9.6 mg/dL (ref 8.6–10.3)
Chloride: 104 mmol/L (ref 98–110)
Creat: 1.01 mg/dL (ref 0.60–1.24)
Globulin: 4.4 g/dL (calc) — ABNORMAL HIGH (ref 1.9–3.7)
Glucose, Bld: 80 mg/dL (ref 65–99)
Potassium: 3.8 mmol/L (ref 3.5–5.3)
Sodium: 137 mmol/L (ref 135–146)
Total Bilirubin: 0.7 mg/dL (ref 0.2–1.2)
Total Protein: 9 g/dL — ABNORMAL HIGH (ref 6.1–8.1)
eGFR: 106 mL/min/{1.73_m2} (ref >=60)

## 2022-01-12 LAB — HIV RNA, RTPCR W/R GT (RTI, PI,INT)
HIV 1 RNA Quant: 24 copies/mL — ABNORMAL HIGH
HIV-1 RNA Quant, Log: 1.38 Log copies/mL — ABNORMAL HIGH

## 2022-01-12 LAB — RPR: RPR Ser Ql: REACTIVE — AB

## 2022-01-12 LAB — FLUORESCENT TREPONEMAL AB(FTA)-IGG-BLD: Fluorescent Treponemal ABS: REACTIVE — AB

## 2022-01-12 LAB — RPR TITER: RPR Titer: 1:4 {titer} — ABNORMAL HIGH

## 2022-02-06 ENCOUNTER — Other Ambulatory Visit: Payer: Self-pay

## 2022-02-06 ENCOUNTER — Ambulatory Visit (INDEPENDENT_AMBULATORY_CARE_PROVIDER_SITE_OTHER): Payer: 59 | Admitting: Pharmacist

## 2022-02-06 DIAGNOSIS — B20 Human immunodeficiency virus [HIV] disease: Secondary | ICD-10-CM

## 2022-02-06 MED ORDER — CABOTEGRAVIR & RILPIVIRINE ER 400 & 600 MG/2ML IM SUER
1.0000 | Freq: Once | INTRAMUSCULAR | Status: AC
  Administered 2022-02-06: 1 via INTRAMUSCULAR

## 2022-02-06 NOTE — Progress Notes (Signed)
HPI: Micheal Leonard is a 26 y.o. male who presents to the [facility] pharmacy clinic for [place] administration.  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   DOXYCYCLINE (VIBRA-TABS) 100 MG TABLET    Take 1 tablet by mouth 2 (two) times daily.   FEXOFENADINE (ALLEGRA) 180 MG TABLET    Take 180 mg by mouth daily.  Modified Medications   No medications on file  Discontinued Medications   No medications on file    Allergies: Allergies  Allergen Reactions   Other     An HIV drug and can not remember  exactly.    Past Medical History: Past Medical History:  Diagnosis Date   Cardiac anomaly, congenital    Diarrhea 01/22/2017   Dizziness 05/30/2020   EBV infection    hx/notes 10/23/2016   Erectile dysfunction 05/30/2020   Gonorrhea 07/20/2019   Hemoptysis 10/23/2016   [name] 10/23/2016   HIV (human immunodeficiency virus infection) (HCC)    Hypertension    Hypertension 04/30/2021   Migraine    "a few/year now; frequency is less than in the past" (10/23/2016)   Nonadherence to medical treatment 09/08/2019   Pharyngitis 10/23/2016   notes 10/23/2016   Pulmonary embolism (HCC) 01/29/2016   S/P bidirectional Glenn shunt    hx/notes  10/23/2016   S/P Fontan procedure    hx/notes 10/23/2016   Syphilis 07/20/2019   Tricuspid atresia and stenosis, congenital     Social History: Social History   Socioeconomic History   Marital status: Significant Other    Spouse name: Not on file   Number of children: Not on file   Years of education: Not on file   Highest education level: Not on file  Occupational History   Not on file  Tobacco Use   Smoking status: Never   Smokeless tobacco: Never  Vaping Use   Vaping Use: Never used  Substance and Sexual Activity   Alcohol use: Not Currently    Comment: 2 GLASSES A WEEK   Drug use: Not Currently    Frequency: 2.0 times per week    Types: Marijuana   Sexual activity: Yes    Partners: Male    Birth control/protection: Condom    Comment: declined condoms  Other Topics Concern   Not on file  Social History Narrative   Not on file   Social Determinants of Health   Financial Resource Strain: Not on file  Food Insecurity: Not on file  Transportation Needs: Not on file  Physical Activity: Not on file  Stress: Not on file  Social Connections: Not on file    Labs: Lab Results  Component Value Date   HIV1RNAQUANT 24 (H) 01/09/2022   HIV1RNAQUANT 56 (H) 12/05/2021  HIV1RNAQUANT 4,850 (H) 10/31/2021   CD4TABS 276 (L) 01/09/2022   CD4TABS 305 (L) 04/30/2021   CD4TABS 213 09/19/2020    RPR and STI Lab Results  Component Value Date   LABRPR REACTIVE (A) 01/09/2022   LABRPR REACTIVE (A) 10/31/2021   LABRPR REACTIVE (A) 09/20/2021   LABRPR REACTIVE (A) 04/30/2021   LABRPR REACTIVE (A) 11/24/2020   RPRTITER 1:4 (H) 01/09/2022   RPRTITER 1:4 (H) 10/31/2021   RPRTITER 1:4 (H) 09/20/2021   RPRTITER 1:16 (H) 04/30/2021   RPRTITER 1:128 (H) 11/24/2020    STI Results GC CT  01/09/2022  9:40 AM Negative    Negative    Negative  Negative    Negative    Negative   04/30/2021 11:11 AM Negative    Negative    Negative  Negative    Positive    Negative    11/24/2020 10:57 AM Negative    Negative    Negative  Negative    Negative    Negative   08/24/2020  8:42 AM Negative    Negative    Negative  Negative    Negative    Negative   06/07/2020 11:16 AM Negative    Negative    Negative  Negative    Negative    Negative   11/02/2019  1:46 PM Negative  Negative   06/16/2019  1:38 PM Negative    Positive    Negative  Negative    Positive    Negative   12/24/2018 12:00 AM Negative    Negative    Negative  Negative    Negative    Negative   07/17/2018 12:00 AM **POSITIVE**    Negative    Negative  **POSITIVE**    Negative    Negative   04/30/2018 12:00 AM Negative  Negative   01/23/2018 12:00 AM Negative    Negative  Negative    Negative   11/27/2017 12:00 AM Negative    Negative    Negative  Negative    Negative    Negative   09/07/2017 12:00 AM **POSITIVE**  Negative   05/05/2017 12:00 AM Negative  **POSITIVE**   01/22/2017 12:00 AM Negative    Negative    Negative  Negative    Negative    Negative   10/25/2016 12:00 AM Negative  **POSITIVE**   10/24/2016 12:00 AM Negative    Negative  Negative    Negative     Hepatitis B Lab Results  Component Value Date   HEPBSAB REACTIVE (A) 12/05/2021   HEPBSAG NON-REACTIVE 01/25/2020   HEPBCAB NON-REACTIVE 01/25/2020   Hepatitis C Lab Results  Component Value Date   HEPCAB NON-REACTIVE 07/02/2019   Hepatitis A Lab Results  Component Value Date   HAV Positive (A) 10/25/2016   Lipids: Lab Results  Component Value Date   CHOL 179 12/05/2021   TRIG 98 12/05/2021   HDL 49 12/05/2021   CHOLHDL 3.7 12/05/2021   VLDL 17 01/22/2017   LDLCALC 110 (H) 12/05/2021    TARGET DATE: The 27th  Assessment: Juergen presents today for his maintenance Cabenuva injections. Past injections were tolerated well without issues.  Administered cabotegravir 400mg /63mL in left upper outer quadrant of the gluteal muscle. Administered rilpivirine 600 mg/76mL in the right upper  outer quadrant of the gluteal muscle. No issues with injections. He will follow up in 1 month for next set of injections.  Plan: - Cabenuva injections administered - Next injections scheduled for 03/06/22 and 04/10/22 with me; 05/08/22  with Dr. [name]  - Call with any issues or questions  Lenka Zhao L. Violette Morneault, PharmD, BCIDP, AAHIVP, CPP Clinical Pharmacist Practitioner Infectious Diseases Clinical Pharmacist [facility]

## 2022-02-10 LAB — HIV-1 RNA QUANT-NO REFLEX-BLD
HIV 1 RNA Quant: <20 Copies/mL — ABNORMAL HIGH
HIV-1 RNA Quant, Log: <1.3 Log cps/mL — ABNORMAL HIGH

## 2022-03-06 ENCOUNTER — Other Ambulatory Visit: Payer: Self-pay

## 2022-03-06 ENCOUNTER — Ambulatory Visit (INDEPENDENT_AMBULATORY_CARE_PROVIDER_SITE_OTHER): Payer: 59 | Admitting: Pharmacist

## 2022-03-06 DIAGNOSIS — B20 Human immunodeficiency virus [HIV] disease: Secondary | ICD-10-CM

## 2022-03-06 MED ORDER — CABOTEGRAVIR & RILPIVIRINE ER 400 & 600 MG/2ML IM SUER
1.0000 | Freq: Once | INTRAMUSCULAR | Status: AC
  Administered 2022-03-06: 1 via INTRAMUSCULAR

## 2022-03-06 NOTE — Progress Notes (Signed)
HPI: Micheal Leonard is a 26 y.o. male who presents to the [facility] pharmacy clinic for [place] administration.  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   DOXYCYCLINE (VIBRA-TABS) 100 MG TABLET    Take 1 tablet by mouth 2 (two) times daily.   FEXOFENADINE (ALLEGRA) 180 MG TABLET    Take 180 mg by mouth daily.  Modified Medications   No medications on file  Discontinued Medications   No medications on file    Allergies: Allergies  Allergen Reactions   Other     An HIV drug and can not remember  exactly.    Past Medical History: Past Medical History:  Diagnosis Date   Cardiac anomaly, congenital    Diarrhea 01/22/2017   Dizziness 05/30/2020   EBV infection    hx/notes 10/23/2016   Erectile dysfunction 05/30/2020   Gonorrhea 07/20/2019   Hemoptysis 10/23/2016   [name] 10/23/2016   HIV (human immunodeficiency virus infection) (HCC)    Hypertension    Hypertension 04/30/2021   Migraine    "a few/year now; frequency is less than in the past" (10/23/2016)   Nonadherence to medical treatment 09/08/2019   Pharyngitis 10/23/2016   notes 10/23/2016   Pulmonary embolism (HCC) 01/29/2016   S/P bidirectional Glenn shunt    hx/notes  10/23/2016   S/P Fontan procedure    hx/notes 10/23/2016   Syphilis 07/20/2019   Tricuspid atresia and stenosis, congenital     Social History: Social History   Socioeconomic History   Marital status: Significant Other    Spouse name: Not on file   Number of children: Not on file   Years of education: Not on file   Highest education level: Not on file  Occupational History   Not on file  Tobacco Use   Smoking status: Never   Smokeless tobacco: Never  Vaping Use   Vaping Use: Never used  Substance and Sexual Activity   Alcohol use: Not Currently    Comment: 2 GLASSES A WEEK   Drug use: Not Currently    Frequency: 2.0 times per week    Types: Marijuana   Sexual activity: Yes    Partners: Male    Birth control/protection: Condom    Comment: declined condoms  Other Topics Concern   Not on file  Social History Narrative   Not on file   Social Determinants of Health   Financial Resource Strain: Not on file  Food Insecurity: Not on file  Transportation Needs: Not on file  Physical Activity: Not on file  Stress: Not on file  Social Connections: Not on file    Labs: Lab Results  Component Value Date   HIV1RNAQUANT <20 (H) 02/06/2022   HIV1RNAQUANT 24 (H) 01/09/2022  HIV1RNAQUANT 56 (H) 12/05/2021   CD4TABS 276 (L) 01/09/2022   CD4TABS 305 (L) 04/30/2021   CD4TABS 213 09/19/2020    RPR and STI Lab Results  Component Value Date   LABRPR REACTIVE (A) 01/09/2022   LABRPR REACTIVE (A) 10/31/2021   LABRPR REACTIVE (A) 09/20/2021   LABRPR REACTIVE (A) 04/30/2021   LABRPR REACTIVE (A) 11/24/2020   RPRTITER 1:4 (H) 01/09/2022   RPRTITER 1:4 (H) 10/31/2021   RPRTITER 1:4 (H) 09/20/2021   RPRTITER 1:16 (H) 04/30/2021   RPRTITER 1:128 (H) 11/24/2020    STI Results GC CT  01/09/2022  9:40 AM Negative    Negative    Negative  Negative    Negative    Negative   04/30/2021 11:11 AM Negative    Negative    Negative  Negative    Positive    Negative    11/24/2020 10:57 AM Negative    Negative    Negative  Negative    Negative    Negative   08/24/2020  8:42 AM Negative    Negative    Negative  Negative    Negative    Negative   06/07/2020 11:16 AM Negative    Negative    Negative  Negative    Negative    Negative   11/02/2019  1:46 PM Negative  Negative   06/16/2019  1:38 PM Negative    Positive    Negative  Negative    Positive    Negative   12/24/2018 12:00 AM Negative    Negative    Negative  Negative    Negative    Negative   07/17/2018 12:00 AM **POSITIVE**    Negative    Negative  **POSITIVE**    Negative    Negative   04/30/2018 12:00 AM Negative  Negative   01/23/2018 12:00 AM Negative    Negative  Negative    Negative   11/27/2017 12:00 AM Negative    Negative    Negative  Negative    Negative    Negative   09/07/2017 12:00 AM **POSITIVE**  Negative   05/05/2017 12:00 AM Negative  **POSITIVE**   01/22/2017 12:00 AM Negative    Negative    Negative  Negative    Negative    Negative   10/25/2016 12:00 AM Negative  **POSITIVE**   10/24/2016 12:00 AM Negative    Negative  Negative    Negative     Hepatitis B Lab Results  Component Value Date   HEPBSAB REACTIVE (A) 12/05/2021   HEPBSAG NON-REACTIVE 01/25/2020   HEPBCAB NON-REACTIVE 01/25/2020   Hepatitis C Lab Results  Component Value Date   HEPCAB NON-REACTIVE 07/02/2019   Hepatitis A Lab Results  Component Value Date   HAV Positive (A) 10/25/2016   Lipids: Lab Results  Component Value Date   CHOL 179 12/05/2021   TRIG 98 12/05/2021   HDL 49 12/05/2021   CHOLHDL 3.7 12/05/2021   VLDL 17 01/22/2017   LDLCALC 110 (H) 12/05/2021    TARGET DATE: The 27th  Assessment: [name] presents today for his maintenance Cabenuva injections. Past injections were tolerated well without issues.  Administered cabotegravir 400mg /13mL in left upper outer quadrant of the gluteal muscle. Administered rilpivirine 600 mg/53mL in the right upper  outer quadrant of the gluteal muscle. No issues with injections. He will follow up in 1 month for next set of injections.  Will check HIV RNA and CD4 count today. Politely declined STI testing.  Plan: 3m  injections administered - HIV RNA and CD4 today - Next injections scheduled for 04/10/22 with me, 05/08/22 with Dr. Tommy Medal, and 06/12/22 with me - Call with any issues or questions  Raisa Ditto L. Vaniyah Lansky, PharmD, BCIDP, AAHIVP, [place] Clinical Pharmacist Practitioner [place]

## 2022-03-07 LAB — T-HELPER CELL (CD4) - (RCID CLINIC ONLY)
CD4 % Helper T Cell: 22 % — ABNORMAL LOW (ref 33–65)
CD4 T Cell Abs: 288 /uL — ABNORMAL LOW (ref 400–1790)

## 2022-03-10 LAB — HIV-1 RNA QUANT-NO REFLEX-BLD
HIV 1 RNA Quant: <20 Copies/mL — ABNORMAL HIGH
HIV-1 RNA Quant, Log: <1.3 Log cps/mL — ABNORMAL HIGH

## 2022-03-31 ENCOUNTER — Other Ambulatory Visit (HOSPITAL_COMMUNITY): Payer: Self-pay

## 2022-04-01 ENCOUNTER — Other Ambulatory Visit (HOSPITAL_COMMUNITY): Payer: Self-pay

## 2022-04-10 ENCOUNTER — Ambulatory Visit (INDEPENDENT_AMBULATORY_CARE_PROVIDER_SITE_OTHER): Payer: 59 | Admitting: Pharmacist

## 2022-04-10 ENCOUNTER — Other Ambulatory Visit: Payer: Self-pay

## 2022-04-10 DIAGNOSIS — B20 Human immunodeficiency virus [HIV] disease: Secondary | ICD-10-CM | POA: Diagnosis not present

## 2022-04-10 DIAGNOSIS — Z79899 Other long term (current) drug therapy: Secondary | ICD-10-CM | POA: Diagnosis not present

## 2022-04-10 DIAGNOSIS — Z113 Encounter for screening for infections with a predominantly sexual mode of transmission: Secondary | ICD-10-CM | POA: Diagnosis not present

## 2022-04-10 MED ORDER — CABOTEGRAVIR & RILPIVIRINE ER 400 & 600 MG/2ML IM SUER
1.0000 | Freq: Once | INTRAMUSCULAR | Status: AC
  Administered 2022-04-10: 1 via INTRAMUSCULAR

## 2022-04-10 NOTE — Addendum Note (Signed)
Addended by: Caffie Pinto on: 04/10/2022 10:00 AM   Modules accepted: Orders

## 2022-04-10 NOTE — Progress Notes (Signed)
HPI: Micheal Leonard is a 26 y.o. male who presents to the [facility] clinic for [place] administration.  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   DOXYCYCLINE (VIBRA-TABS) 100 MG TABLET    Take 1 tablet by mouth 2 (two) times daily.   FEXOFENADINE (ALLEGRA) 180 MG TABLET    Take 180 mg by mouth daily.  Modified Medications   No medications on file  Discontinued Medications   No medications on file    Allergies: Allergies  Allergen Reactions   Other     An HIV drug and can not remember  exactly.    Past Medical History: Past Medical History:  Diagnosis Date   Cardiac anomaly, congenital    Diarrhea 01/22/2017   Dizziness 05/30/2020   EBV infection    hx/notes 10/23/2016   Erectile dysfunction 05/30/2020   Gonorrhea 07/20/2019   Hemoptysis 10/23/2016   Micheal Leonard 10/23/2016   HIV (human immunodeficiency virus infection) ([place])    Hypertension    Hypertension 04/30/2021   Migraine    "a few/year now; frequency is less than in the past" (10/23/2016)   Nonadherence to medical treatment 09/08/2019   Pharyngitis 10/23/2016   notes 10/23/2016   Pulmonary embolism ([place]) 01/29/2016   S/P bidirectional Glenn shunt    hx/notes  10/23/2016   S/P Fontan procedure    hx/notes 10/23/2016   Syphilis 07/20/2019   Tricuspid atresia and stenosis, congenital     Social History: Social History   Socioeconomic History   Marital status: Significant Other    Spouse name: Not on file   Number of children: Not on file   Years of education: Not on file   Highest education level: Not on file  Occupational History   Not on file  Tobacco Use   Smoking status: Never   Smokeless tobacco: Never  Vaping Use   Vaping Use: Never used  Substance and Sexual Activity   Alcohol use: Not Currently    Comment: 2 GLASSES A WEEK   Drug use: Not Currently    Frequency: 2.0 times per week    Types: Marijuana   Sexual activity: Yes    Partners: Male    Birth control/protection: Condom    Comment: declined condoms  Other Topics Concern   Not on file  Social History Narrative   Not on file   Social Determinants of Health   Financial Resource Strain: Not on file  Food Insecurity: Not on file  Transportation Needs: Not on file  Physical Activity: Not on file  Stress: Not on file  Social Connections: Not on file    Labs: Lab Results  Component Value Date   HIV1RNAQUANT <20 (H) 03/06/2022   HIV1RNAQUANT <20 (H) 02/06/2022  HIV1RNAQUANT 24 (H) 01/09/2022   CD4TABS 288 (L) 03/06/2022   CD4TABS 276 (L) 01/09/2022   CD4TABS 305 (L) 04/30/2021    RPR and STI Lab Results  Component Value Date   LABRPR REACTIVE (A) 01/09/2022   LABRPR REACTIVE (A) 10/31/2021   LABRPR REACTIVE (A) 09/20/2021   LABRPR REACTIVE (A) 04/30/2021   LABRPR REACTIVE (A) 11/24/2020   RPRTITER 1:4 (H) 01/09/2022   RPRTITER 1:4 (H) 10/31/2021   RPRTITER 1:4 (H) 09/20/2021   RPRTITER 1:16 (H) 04/30/2021   RPRTITER 1:128 (H) 11/24/2020    STI Results GC CT  01/09/2022  9:40 AM Negative    Negative    Negative  Negative    Negative    Negative   04/30/2021 11:11 AM Negative    Negative    Negative  Negative    Positive    Negative    11/24/2020 10:57 AM Negative    Negative    Negative  Negative    Negative    Negative   08/24/2020  8:42 AM Negative    Negative    Negative  Negative    Negative    Negative   06/07/2020 11:16 AM Negative    Negative    Negative  Negative    Negative    Negative   11/02/2019  1:46 PM Negative  Negative   06/16/2019  1:38 PM Negative    Positive    Negative  Negative    Positive    Negative   12/24/2018 12:00 AM Negative    Negative    Negative  Negative    Negative    Negative   07/17/2018 12:00 AM **POSITIVE**    Negative    Negative  **POSITIVE**    Negative    Negative   04/30/2018 12:00 AM Negative  Negative   01/23/2018 12:00 AM Negative    Negative  Negative    Negative   11/27/2017 12:00 AM Negative    Negative    Negative  Negative    Negative    Negative   09/07/2017 12:00 AM **POSITIVE**  Negative   05/05/2017 12:00 AM Negative  **POSITIVE**   01/22/2017 12:00 AM Negative    Negative    Negative  Negative    Negative    Negative   10/25/2016 12:00 AM Negative  **POSITIVE**   10/24/2016 12:00 AM Negative    Negative  Negative    Negative     Hepatitis B Lab Results  Component Value Date   HEPBSAB REACTIVE (A) 12/05/2021   HEPBSAG NON-REACTIVE 01/25/2020   HEPBCAB NON-REACTIVE 01/25/2020   Hepatitis C Lab Results  Component Value Date   HEPCAB NON-REACTIVE 07/02/2019   Hepatitis A Lab Results  Component Value Date   HAV Positive (A) 10/25/2016   Lipids: Lab Results  Component Value Date   CHOL 179 12/05/2021   TRIG 98 12/05/2021   HDL 49 12/05/2021   CHOLHDL 3.7 12/05/2021   VLDL 17 01/22/2017   LDLCALC 110 (H) 12/05/2021    TARGET DATE: The 27th   Assessment: Micheal Leonard presents today for his maintenance Cabenuva injections. Past injections were tolerated well without issues.   Administered cabotegravir 400mg /60mL in left upper outer quadrant of the gluteal muscle. Administered rilpivirine 600 mg/98mL in the right upper  outer quadrant of the gluteal muscle. No issues with injections. He will follow up in 1 month for next set of injections.   Will check routine labs today in anticipation of Dr. Lucianne Lei Dam's appointment  next month. He declines extragenital STI testing and the flu vaccine today.  Plan: - Cabenuva injections administered - HIV RNA, CBC w diff, CMP, RPR, and urine cytology today - Next injections scheduled for 05/08/22 with Dr. Tommy Medal, 06/12/22 with me, and 07/10/22 with me  - Call with any issues or questions  Micheal Leonard L. Mirza Kidney, PharmD, BCIDP, AAHIVP, [place] Clinical Pharmacist Practitioner [facility]

## 2022-04-13 LAB — COMPREHENSIVE METABOLIC PANEL
AG Ratio: 1.3 (calc) (ref 1.0–2.5)
ALT: 25 U/L (ref 9–46)
AST: 32 U/L (ref 10–40)
Albumin: 4.7 g/dL (ref 3.6–5.1)
Alkaline phosphatase (APISO): 73 U/L (ref 36–130)
BUN: 12 mg/dL (ref 7–25)
CO2: 23 mmol/L (ref 20–32)
Calcium: 9.3 mg/dL (ref 8.6–10.3)
Chloride: 108 mmol/L (ref 98–110)
Creat: 1.05 mg/dL (ref 0.60–1.24)
Globulin: 3.6 g/dL (calc) (ref 1.9–3.7)
Glucose, Bld: 92 mg/dL (ref 65–99)
Potassium: 3.8 mmol/L (ref 3.5–5.3)
Sodium: 141 mmol/L (ref 135–146)
Total Bilirubin: 0.6 mg/dL (ref 0.2–1.2)
Total Protein: 8.3 g/dL — ABNORMAL HIGH (ref 6.1–8.1)

## 2022-04-13 LAB — CBC WITH DIFFERENTIAL/PLATELET
Absolute Monocytes: 352 cells/uL (ref 200–950)
Basophils Absolute: 29 cells/uL (ref 0–200)
Basophils Relative: 0.9 %
Eosinophils Absolute: 192 cells/uL (ref 15–500)
Eosinophils Relative: 6 %
HCT: 45.7 % (ref 38.5–50.0)
Hemoglobin: 15.6 g/dL (ref 13.2–17.1)
Lymphs Abs: 1110 cells/uL (ref 850–3900)
MCH: 30.4 pg (ref 27.0–33.0)
MCHC: 34.1 g/dL (ref 32.0–36.0)
MCV: 89.1 fL (ref 80.0–100.0)
MPV: 12.4 fL (ref 7.5–12.5)
Monocytes Relative: 11 %
Neutro Abs: 1517 cells/uL (ref 1500–7800)
Neutrophils Relative %: 47.4 %
Platelets: 199 10*3/uL (ref 140–400)
RBC: 5.13 10*6/uL (ref 4.20–5.80)
RDW: 13.5 % (ref 11.0–15.0)
Total Lymphocyte: 34.7 %
WBC: 3.2 10*3/uL — ABNORMAL LOW (ref 3.8–10.8)

## 2022-04-13 LAB — HIV-1 RNA QUANT-NO REFLEX-BLD
HIV 1 RNA Quant: <20 Copies/mL — ABNORMAL HIGH
HIV-1 RNA Quant, Log: <1.3 Log cps/mL — ABNORMAL HIGH

## 2022-04-13 LAB — RPR TITER: RPR Titer: 1:4 {titer} — ABNORMAL HIGH

## 2022-04-13 LAB — FLUORESCENT TREPONEMAL AB(FTA)-IGG-BLD: Fluorescent Treponemal ABS: REACTIVE — AB

## 2022-04-13 LAB — RPR: RPR Ser Ql: REACTIVE — AB

## 2022-05-08 ENCOUNTER — Encounter: Payer: Self-pay | Admitting: Infectious Disease

## 2022-05-08 ENCOUNTER — Ambulatory Visit (INDEPENDENT_AMBULATORY_CARE_PROVIDER_SITE_OTHER): Payer: 59 | Admitting: Infectious Disease

## 2022-05-08 ENCOUNTER — Other Ambulatory Visit: Payer: Self-pay

## 2022-05-08 VITALS — BP 150/97 | HR 101 | Temp 98.3°F | Resp 16 | Ht 73.0 in | Wt 216.0 lb

## 2022-05-08 DIAGNOSIS — I10 Essential (primary) hypertension: Secondary | ICD-10-CM

## 2022-05-08 DIAGNOSIS — A549 Gonococcal infection, unspecified: Secondary | ICD-10-CM

## 2022-05-08 DIAGNOSIS — A539 Syphilis, unspecified: Secondary | ICD-10-CM | POA: Diagnosis not present

## 2022-05-08 DIAGNOSIS — Z7185 Encounter for immunization safety counseling: Secondary | ICD-10-CM

## 2022-05-08 DIAGNOSIS — B20 Human immunodeficiency virus [HIV] disease: Secondary | ICD-10-CM

## 2022-05-08 MED ORDER — CABOTEGRAVIR & RILPIVIRINE ER 400 & 600 MG/2ML IM SUER
1.0000 | Freq: Once | INTRAMUSCULAR | Status: AC
  Administered 2022-05-08: 1 via INTRAMUSCULAR

## 2022-05-08 NOTE — Progress Notes (Signed)
Recheck BP before end of visit - 150/97. Patient hasn't taken Amlodipine 5 mg today. Patient advised to take medication when he gets home and to recheck BP.   [place]air, CMA

## 2022-05-08 NOTE — Progress Notes (Signed)
Subjective:  Complaint follow-up for Cabenuva injection wanting to switch over to every 2 months  Patient ID: Micheal Leonard, male    DOB: 06/11/1996, 26 y.o.   MRN: [id number]  HPI  26 year old black man with HIV that was previously well controlled but not so much recently.  We did in fact enroll him into the latitude study but he was not able to maintain virological suppression.  We decided ultimately to give him Cabenuva off label while he was viremic and after initiation of these injections he has been undetectable every time we have been checking his viral load at his every monthly Cabenuva visits.  He is now anxious to switch over to every 2 month visits I think that is quite reasonable as long as we have appropriately dosed him with a higher dose for the first 2 months.    Past Medical History:  Diagnosis Date   Cardiac anomaly, congenital    Diarrhea 01/22/2017   Dizziness 05/30/2020   EBV infection    hx/notes 10/23/2016   Erectile dysfunction 05/30/2020   Gonorrhea 07/20/2019   Hemoptysis 10/23/2016   Micheal Leonard 10/23/2016   HIV (human immunodeficiency virus infection) (HCC)    Hypertension    Hypertension 04/30/2021   Migraine    "a few/year now; frequency is less than in the past" (10/23/2016)   Nonadherence to medical treatment 09/08/2019   Pharyngitis 10/23/2016   notes 10/23/2016   Pulmonary embolism (HCC) 01/29/2016   S/P bidirectional Glenn shunt    hx/notes 10/23/2016   S/P Fontan procedure    hx/notes 10/23/2016   Syphilis 07/20/2019   Tricuspid atresia and stenosis, congenital     Past Surgical History:  Procedure Laterality Date   CARDIAC CATHETERIZATION  01/2016   at [facility]; hx/notes 10/23/2016   CARDIAC SURGERY  01/1997; 2000   fontane procedure at [facility] as a child     Family History  Problem Relation Age of Onset   Hypertension Father       Social History   Socioeconomic History   Marital status: Significant Other    Spouse name: Not on file   Number  of children: Not on file   Years of education: Not on file   Highest education level: Not on file  Occupational History   Not on file  Tobacco Use   Smoking status: Never   Smokeless tobacco: Never  Vaping Use   Vaping Use: Never used  Substance and Sexual Activity   Alcohol use: Not Currently    Comment: 2 GLASSES A WEEK   Drug use: Not Currently    Frequency: 2.0 times per week    Types: Marijuana   Sexual activity: Yes    Partners: Male    Birth control/protection: Condom    Comment: declined condoms  Other Topics Concern   Not on file  Social History Narrative   Not on file   Social Determinants of Health   Financial Resource Strain: Not on file  Food Insecurity: Not on file  Transportation Needs: Not on file  Physical Activity: Not on file  Stress: Not on file  Social Connections: Not on file    Allergies  Allergen Reactions   Other     An HIV drug and can not remember  exactly.     Current Outpatient Medications:    amLODipine (NORVASC) 5 MG tablet, Take 1 tablet (5 mg total) by mouth daily., Disp: 30 tablet, Rfl: 11   doxycycline (VIBRA-TABS) 100 MG tablet, Take 1  tablet by mouth 2 (two) times daily. (Patient not taking: Reported on 01/09/2022), Disp: 28 tablet, Rfl: 0   fexofenadine (ALLEGRA) 180 MG tablet, Take 180 mg by mouth daily. (Patient not taking: Reported on 05/08/2022), Disp: , Rfl:    Review of Systems  Constitutional:  Negative for activity change, appetite change, chills, diaphoresis, fatigue, fever and unexpected weight change.  HENT:  Negative for congestion, rhinorrhea, sinus pressure, sneezing, sore throat and trouble swallowing.   Eyes:  Negative for photophobia and visual disturbance.  Respiratory:  Negative for cough, chest tightness, shortness of breath, wheezing and stridor.   Cardiovascular:  Negative for chest pain, palpitations and leg swelling.  Gastrointestinal:  Negative for abdominal distention, abdominal pain, anal bleeding,  blood in stool, constipation, diarrhea, nausea and vomiting.  Genitourinary:  Negative for difficulty urinating, dysuria, flank pain and hematuria.  Musculoskeletal:  Negative for arthralgias, back pain, gait problem, joint swelling and myalgias.  Skin:  Negative for color change, pallor, rash and wound.  Neurological:  Negative for dizziness, tremors, weakness and light-headedness.  Hematological:  Negative for adenopathy. Does not bruise/bleed easily.  Psychiatric/Behavioral:  Negative for agitation, behavioral problems, confusion, decreased concentration, dysphoric mood and sleep disturbance.        Objective:   Physical Exam Constitutional:      Appearance: He is well-developed.  HENT:     Head: Normocephalic and atraumatic.  Eyes:     Conjunctiva/sclera: Conjunctivae normal.  Cardiovascular:     Rate and Rhythm: Normal rate and regular rhythm.  Pulmonary:     Effort: Pulmonary effort is normal. No respiratory distress.     Breath sounds: No wheezing.  Abdominal:     General: There is no distension.     Palpations: Abdomen is soft.  Musculoskeletal:        General: No tenderness. Normal range of motion.     Cervical back: Normal range of motion and neck supple.  Skin:    General: Skin is warm and dry.     Coloration: Skin is not pale.     Findings: No erythema or rash.  Neurological:     General: No focal deficit present.     Mental Status: He is alert and oriented to person, place, and time.  Psychiatric:        Mood and Affect: Mood normal.        Behavior: Behavior normal.        Thought Content: Thought content normal.        Judgment: Judgment normal.           Assessment & Plan:   HIV disease:  I will add order HIV viral load CD4 count CBC with differential CMP, RPR GC and chlamydia and I will continue  Micheal Leonard's Cavenuva injections. He will get higher dose next month, the month Afterwards and this high dose every 2 months.  STI screening  screen for gonorrhea chlamydia and syphilis   Hypertension: Poorly controlled today though he had not taken his amlodipine yet.  We checked blood pressure twice.    Vaccine counseling recommended updated flu and COVID vaccines

## 2022-05-09 LAB — T-HELPER CELLS (CD4) COUNT (NOT AT ARMC)
CD4 % Helper T Cell: 26 % — ABNORMAL LOW (ref 33–65)
CD4 T Cell Abs: 267 /uL — ABNORMAL LOW (ref 400–1790)

## 2022-05-10 LAB — HIV-1 RNA QUANT-NO REFLEX-BLD
HIV 1 RNA Quant: NOT DETECTED Copies/mL
HIV-1 RNA Quant, Log: NOT DETECTED Log cps/mL

## 2022-06-11 NOTE — Progress Notes (Signed)
HPI: Micheal Leonard is a 26 y.o. male who presents to the [facility] pharmacy clinic for [place] administration.  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   DOXYCYCLINE (VIBRA-TABS) 100 MG TABLET    Take 1 tablet by mouth 2 (two) times daily.   FEXOFENADINE (ALLEGRA) 180 MG TABLET    Take 180 mg by mouth daily.  Modified Medications   No medications on file  Discontinued Medications   No medications on file    Allergies: Allergies  Allergen Reactions   Other     An HIV drug and can not remember  exactly.    Past Medical History: Past Medical History:  Diagnosis Date   Cardiac anomaly, congenital    Diarrhea 01/22/2017   Dizziness 05/30/2020   EBV infection    hx/notes 10/23/2016   Erectile dysfunction 05/30/2020   Gonorrhea 07/20/2019   Hemoptysis 10/23/2016   [name] 10/23/2016   HIV (human immunodeficiency virus infection) (HCC)    Hypertension    Hypertension 04/30/2021   Migraine    "a few/year now; frequency is less than in the past" (10/23/2016)   Nonadherence to medical treatment 09/08/2019   Pharyngitis 10/23/2016   notes 10/23/2016   Pulmonary embolism (HCC) 01/29/2016   S/P bidirectional Glenn shunt    hx/notes  10/23/2016   S/P Fontan procedure    hx/notes 10/23/2016   Syphilis 07/20/2019   Tricuspid atresia and stenosis, congenital     Social History: Social History   Socioeconomic History   Marital status: Significant Other    Spouse name: Not on file   Number of children: Not on file   Years of education: Not on file   Highest education level: Not on file  Occupational History   Not on file  Tobacco Use   Smoking status: Never   Smokeless tobacco: Never  Vaping Use   Vaping Use: Never used  Substance and Sexual Activity   Alcohol use: Not Currently    Comment: 2 GLASSES A WEEK   Drug use: Not Currently    Frequency: 2.0 times per week    Types: Marijuana   Sexual activity: Yes    Partners: Male    Birth control/protection: Condom    Comment: declined condoms  Other Topics Concern   Not on file  Social History Narrative   Not on file   Social Determinants of Health   Financial Resource Strain: Not on file  Food Insecurity: Not on file  Transportation Needs: Not on file  Physical Activity: Not on file  Stress: Not on file  Social Connections: Not on file    Labs: Lab Results  Component Value Date   HIV1RNAQUANT Not Detected 05/08/2022   HIV1RNAQUANT <20 (H) 04/10/2022  HIV1RNAQUANT <20 (H) 03/06/2022   CD4TABS 267 (L) 05/08/2022   CD4TABS 288 (L) 03/06/2022   CD4TABS 276 (L) 01/09/2022    RPR and STI Lab Results  Component Value Date   LABRPR REACTIVE (A) 04/10/2022   LABRPR REACTIVE (A) 01/09/2022   LABRPR REACTIVE (A) 10/31/2021   LABRPR REACTIVE (A) 09/20/2021   LABRPR REACTIVE (A) 04/30/2021   RPRTITER 1:4 (H) 04/10/2022   RPRTITER 1:4 (H) 01/09/2022   RPRTITER 1:4 (H) 10/31/2021   RPRTITER 1:4 (H) 09/20/2021   RPRTITER 1:16 (H) 04/30/2021    STI Results GC CT  01/09/2022  9:40 AM Negative    Negative    Negative  Negative    Negative    Negative   04/30/2021 11:11 AM Negative    Negative    Negative  Negative    Positive    Negative    11/24/2020 10:57 AM Negative    Negative    Negative  Negative    Negative    Negative   08/24/2020  8:42 AM Negative    Negative    Negative  Negative    Negative    Negative   06/07/2020 11:16 AM Negative    Negative    Negative  Negative    Negative    Negative   11/02/2019  1:46 PM Negative  Negative   06/16/2019  1:38 PM Negative    Positive    Negative  Negative    Positive    Negative   12/24/2018 12:00 AM Negative    Negative    Negative  Negative    Negative    Negative   07/17/2018 12:00 AM **POSITIVE**    Negative    Negative  **POSITIVE**    Negative    Negative   04/30/2018 12:00 AM Negative  Negative   01/23/2018 12:00 AM Negative    Negative  Negative    Negative   11/27/2017 12:00 AM Negative    Negative    Negative  Negative    Negative    Negative   09/07/2017 12:00 AM **POSITIVE**  Negative   05/05/2017 12:00 AM Negative  **POSITIVE**   01/22/2017 12:00 AM Negative    Negative    Negative  Negative    Negative    Negative   10/25/2016 12:00 AM Negative  **POSITIVE**   10/24/2016 12:00 AM Negative    Negative  Negative    Negative     Hepatitis B Lab Results  Component Value Date   HEPBSAB REACTIVE (A) 12/05/2021   HEPBSAG NON-REACTIVE 01/25/2020   HEPBCAB NON-REACTIVE 01/25/2020   Hepatitis C Lab Results  Component Value Date   HEPCAB NON-REACTIVE 07/02/2019   Hepatitis A Lab Results  Component Value Date   HAV Positive (A) 10/25/2016   Lipids: Lab Results  Component Value Date   CHOL 179 12/05/2021   TRIG 98 12/05/2021   HDL 49 12/05/2021   CHOLHDL 3.7 12/05/2021   VLDL 17 01/22/2017   LDLCALC 110 (H) 12/05/2021    TARGET DATE: The 26th of the month  Assessment: Micheal Leonard presents today for his maintenance Cabenuva injections. Past injections were tolerated well without issues. He had previously been on the 30-month dosing schedule but is switching to the 73-month dosing schedule per the recommendation  of Dr. [name]. Per the [facility] package insert, since the patient was on the low-dose Cabenuva, he needs an initiation injection of the high-dose Cabenuva 1 month from his last maintenance injection, which was  05/08/2022. Following this, he can receive maintenance injections of the high-dose Cabenuva every 2 months. He was offered STI screening and condoms which he declined today.  He is eligible for the flu, COVID, meningococcal and Mpox vaccines. After discussing the risks and benefits, he accepted the flu vaccine but declined the COVID, meningococcal and Mpox vaccines. Administered the flu vaccine in the left deltoid.  Administered cabotegravir 600mg /54mL in left upper outer quadrant of the gluteal muscle. Administered rilpivirine 900 mg/39mL in the right upper outer quadrant of the gluteal muscle. No issues with injections. He will follow up in 2 months for next set of injections.  Plan: - First High-dose Cabenuva injections administered - Administered the flu vaccine today - Next injections scheduled for 08/07/2022 with Marchelle Folks [facility] is aware of this time) - Call with any issues or questions  Jacolyn Reedy, Student Pharm-D [facility]

## 2022-06-12 ENCOUNTER — Other Ambulatory Visit: Payer: Self-pay

## 2022-06-12 ENCOUNTER — Ambulatory Visit (INDEPENDENT_AMBULATORY_CARE_PROVIDER_SITE_OTHER): Payer: 59 | Admitting: Pharmacist

## 2022-06-12 DIAGNOSIS — B20 Human immunodeficiency virus [HIV] disease: Secondary | ICD-10-CM

## 2022-06-12 DIAGNOSIS — Z23 Encounter for immunization: Secondary | ICD-10-CM | POA: Diagnosis not present

## 2022-06-12 MED ORDER — CABOTEGRAVIR & RILPIVIRINE ER 600 & 900 MG/3ML IM SUER
1.0000 | Freq: Once | INTRAMUSCULAR | Status: AC
  Administered 2022-06-12: 1 via INTRAMUSCULAR

## 2022-06-12 MED ORDER — CABOTEGRAVIR & RILPIVIRINE ER 600 & 900 MG/3ML IM SUER: 1.0000 | INTRAMUSCULAR | 1 refills | Status: DC

## 2022-07-04 ENCOUNTER — Other Ambulatory Visit (HOSPITAL_COMMUNITY): Payer: Self-pay

## 2022-07-10 ENCOUNTER — Other Ambulatory Visit (HOSPITAL_COMMUNITY): Payer: Self-pay

## 2022-07-10 ENCOUNTER — Encounter: Payer: 59 | Admitting: Pharmacist

## 2022-07-23 ENCOUNTER — Other Ambulatory Visit (HOSPITAL_COMMUNITY): Payer: Self-pay

## 2022-08-07 ENCOUNTER — Other Ambulatory Visit: Payer: Self-pay

## 2022-08-07 ENCOUNTER — Ambulatory Visit (INDEPENDENT_AMBULATORY_CARE_PROVIDER_SITE_OTHER): Payer: 59 | Admitting: Pharmacist

## 2022-08-07 ENCOUNTER — Encounter: Payer: 59 | Admitting: Pharmacist

## 2022-08-07 DIAGNOSIS — B20 Human immunodeficiency virus [HIV] disease: Secondary | ICD-10-CM

## 2022-08-07 MED ORDER — CABOTEGRAVIR & RILPIVIRINE ER 600 & 900 MG/3ML IM SUER
1.0000 | Freq: Once | INTRAMUSCULAR | Status: AC
  Administered 2022-08-07: 1 via INTRAMUSCULAR

## 2022-08-07 NOTE — Progress Notes (Signed)
HPI: Micheal Leonard is a 27 y.o. male who presents to the [place] clinic for [place] administration.  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   CABOTEGRAVIR & RILPIVIRINE ER (CABENUVA) 600 & 900 MG/3ML INJECTION    Inject 1 kit into the muscle every 2 (two) months.   DOXYCYCLINE (VIBRA-TABS) 100 MG TABLET    Take 1 tablet by mouth 2 (two) times daily.   FEXOFENADINE (ALLEGRA) 180 MG TABLET    Take 180 mg by mouth daily.  Modified Medications   No medications on file  Discontinued Medications   No medications on file    Allergies: Allergies  Allergen Reactions   Other     An HIV drug and can not remember  exactly.    Past Medical History: Past Medical History:  Diagnosis Date   Cardiac anomaly, congenital    Diarrhea 01/22/2017   Dizziness 05/30/2020   EBV infection    hx/notes 10/23/2016   Erectile dysfunction 05/30/2020   Gonorrhea 07/20/2019   Hemoptysis 10/23/2016   [name] 10/23/2016   HIV (human immunodeficiency virus infection) ([place])    Hypertension    Hypertension 04/30/2021   Migraine    "a few/year now; frequency is less than in the past" (10/23/2016)   Nonadherence to medical treatment 09/08/2019    Pharyngitis 10/23/2016   notes 10/23/2016   Pulmonary embolism ([place]) 01/29/2016   S/P bidirectional Glenn shunt    hx/notes 10/23/2016   S/P Fontan procedure    hx/notes 10/23/2016   Syphilis 07/20/2019   Tricuspid atresia and stenosis, congenital     Social History: Social History   Socioeconomic History   Marital status: Significant Other    Spouse name: Not on file   Number of children: Not on file   Years of education: Not on file   Highest education level: Not on file  Occupational History   Not on file  Tobacco Use   Smoking status: Never   Smokeless tobacco: Never  Vaping Use   Vaping Use: Never used  Substance and Sexual Activity   Alcohol use: Not Currently    Comment: 2 GLASSES A WEEK   Drug use: Not Currently    Frequency: 2.0 times per week    Types: Marijuana   Sexual activity: Yes    Partners: Male    Birth control/protection: Condom    Comment: declined condoms  Other Topics Concern   Not on file  Social History Narrative   Not on file   Social Determinants of Health   Financial Resource Strain: Not on file  Food Insecurity: Not on file  Transportation Needs: Not on file  Physical Activity: Not on file  Stress: Not on file  Social Connections: Not on  file    Labs: Lab Results  Component Value Date   HIV1RNAQUANT Not Detected 05/08/2022   HIV1RNAQUANT <20 (H) 04/10/2022   HIV1RNAQUANT <20 (H) 03/06/2022   CD4TABS 267 (L) 05/08/2022   CD4TABS 288 (L) 03/06/2022   CD4TABS 276 (L) 01/09/2022    RPR and STI Lab Results  Component Value Date   LABRPR REACTIVE (A) 04/10/2022   LABRPR REACTIVE (A) 01/09/2022   LABRPR REACTIVE (A) 10/31/2021   LABRPR REACTIVE (A) 09/20/2021   LABRPR REACTIVE (A) 04/30/2021   RPRTITER 1:4 (H) 04/10/2022   RPRTITER 1:4 (H) 01/09/2022   RPRTITER 1:4 (H) 10/31/2021   RPRTITER 1:4 (H) 09/20/2021   RPRTITER 1:16 (H) 04/30/2021    STI Results GC CT  01/09/2022  9:40 AM Negative    Negative    Negative   Negative    Negative    Negative   04/30/2021 11:11 AM Negative    Negative    Negative  Negative    Positive    Negative   11/24/2020 10:57 AM Negative    Negative    Negative  Negative    Negative    Negative   08/24/2020  8:42 AM Negative    Negative    Negative  Negative    Negative    Negative   06/07/2020 11:16 AM Negative    Negative    Negative  Negative    Negative    Negative   11/02/2019  1:46 PM Negative  Negative   06/16/2019  1:38 PM Negative    Positive    Negative  Negative    Positive    Negative   12/24/2018 12:00 AM Negative    Negative    Negative  Negative    Negative    Negative   07/17/2018 12:00 AM **POSITIVE**    Negative    Negative  **POSITIVE**    Negative    Negative   04/30/2018 12:00 AM Negative  Negative   01/23/2018 12:00 AM Negative    Negative  Negative    Negative   11/27/2017 12:00 AM Negative    Negative    Negative  Negative    Negative    Negative   09/07/2017 12:00 AM **POSITIVE**  Negative   05/05/2017 12:00 AM Negative  **POSITIVE**   01/22/2017 12:00 AM Negative    Negative    Negative  Negative    Negative    Negative   10/25/2016 12:00 AM Negative  **POSITIVE**   10/24/2016 12:00 AM Negative    Negative  Negative    Negative     Hepatitis B Lab Results  Component Value Date   HEPBSAB REACTIVE (A) 12/05/2021   HEPBSAG NON-REACTIVE 01/25/2020   HEPBCAB NON-REACTIVE 01/25/2020   Hepatitis C Lab Results  Component Value Date   HEPCAB NON-REACTIVE 07/02/2019   Hepatitis A Lab Results  Component Value Date   HAV Positive (A) 10/25/2016   Lipids: Lab Results  Component Value Date   CHOL 179 12/05/2021   TRIG 98 12/05/2021   HDL 49 12/05/2021   CHOLHDL 3.7 12/05/2021   VLDL 17 01/22/2017   LDLCALC 110 (H) 12/05/2021    TARGET DATE: The 27th  Assessment: Kyros presents today for his maintenance Cabenuva injections. Past injections were tolerated well without issues. Will check a HIV  RNA today.  Administered cabotegravir 600mg /32mL in left upper outer quadrant of the gluteal muscle. Administered rilpivirine 900 mg/64mL in the right upper outer quadrant of the gluteal muscle. No  issues with injections. He will follow up in 2 months for next set of injections.  Plan: - Cabenuva injections administered - HIV RNA today - Next injections scheduled for 10/02/22 - Call with any issues or questions  Jolynn Bajorek L. Omarii Scalzo, PharmD, BCIDP, AAHIVP, [place] Clinical Pharmacist Practitioner [facility]

## 2022-08-10 LAB — HIV-1 RNA QUANT-NO REFLEX-BLD
HIV 1 RNA Quant: NOT DETECTED Copies/mL
HIV-1 RNA Quant, Log: NOT DETECTED Log cps/mL

## 2022-10-02 ENCOUNTER — Other Ambulatory Visit: Payer: Self-pay

## 2022-10-02 ENCOUNTER — Ambulatory Visit (INDEPENDENT_AMBULATORY_CARE_PROVIDER_SITE_OTHER): Payer: 59 | Admitting: Pharmacist

## 2022-10-02 DIAGNOSIS — B20 Human immunodeficiency virus [HIV] disease: Secondary | ICD-10-CM

## 2022-10-02 MED ORDER — CABOTEGRAVIR & RILPIVIRINE ER 600 & 900 MG/3ML IM SUER
1.0000 | Freq: Once | INTRAMUSCULAR | Status: AC
  Administered 2022-10-02: 1 via INTRAMUSCULAR

## 2022-10-02 NOTE — Progress Notes (Signed)
HPI: Micheal Leonard is a 27 y.o. male who presents to the Micheal Leonard clinic for Micheal Leonard administration.  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   CABOTEGRAVIR & RILPIVIRINE ER (CABENUVA) 600 & 900 MG/3ML INJECTION    Inject 1 kit into the muscle every 2 (two) months.   DOXYCYCLINE (VIBRA-TABS) 100 MG TABLET    Take 1 tablet by mouth 2 (two) times daily.   FEXOFENADINE (ALLEGRA) 180 MG TABLET    Take 180 mg by mouth daily.  Modified Medications   No medications on file  Discontinued Medications   No medications on file    Allergies: Allergies  Allergen Reactions   Other     An HIV drug and can not remember  exactly.    Past Medical History: Past Medical History:  Diagnosis Date   Cardiac anomaly, congenital    Diarrhea 01/22/2017   Dizziness 05/30/2020   EBV infection    hx/notes 10/23/2016   Erectile dysfunction 05/30/2020   Gonorrhea 07/20/2019   Hemoptysis 10/23/2016   Micheal Leonard 10/23/2016   HIV (human immunodeficiency virus infection) ([place])    Hypertension    Hypertension 04/30/2021   Migraine    "a few/year now; frequency is less than in the past" (10/23/2016)   Nonadherence to medical treatment 09/08/2019    Pharyngitis 10/23/2016   notes 10/23/2016   Pulmonary embolism ([place]) 01/29/2016   S/P bidirectional Glenn shunt    hx/notes 10/23/2016   S/P Fontan procedure    hx/notes 10/23/2016   Syphilis 07/20/2019   Tricuspid atresia and stenosis, congenital     Social History: Social History   Socioeconomic History   Marital status: Significant Other    Spouse name: Not on file   Number of children: Not on file   Years of education: Not on file   Highest education level: Not on file  Occupational History   Not on file  Tobacco Use   Smoking status: Never   Smokeless tobacco: Never  Vaping Use   Vaping Use: Never used  Substance and Sexual Activity   Alcohol use: Not Currently    Comment: 2 GLASSES A WEEK   Drug use: Not Currently    Frequency: 2.0 times per week    Types: Marijuana   Sexual activity: Yes    Partners: Male    Birth control/protection: Condom    Comment: declined condoms  Other Topics Concern   Not on file  Social History Narrative   Not on file   Social Determinants of Health   Financial Resource Strain: Not on file  Food Insecurity: Not on file  Transportation Needs: Not on file  Physical Activity: Not on file  Stress: Not on file  Social Connections: Not on  file    Labs: Lab Results  Component Value Date   HIV1RNAQUANT Not Detected 08/07/2022   HIV1RNAQUANT Not Detected 05/08/2022   HIV1RNAQUANT <20 (H) 04/10/2022   CD4TABS 267 (L) 05/08/2022   CD4TABS 288 (L) 03/06/2022   CD4TABS 276 (L) 01/09/2022    RPR and STI Lab Results  Component Value Date   LABRPR REACTIVE (A) 04/10/2022   LABRPR REACTIVE (A) 01/09/2022   LABRPR REACTIVE (A) 10/31/2021   LABRPR REACTIVE (A) 09/20/2021   LABRPR REACTIVE (A) 04/30/2021   RPRTITER 1:4 (H) 04/10/2022   RPRTITER 1:4 (H) 01/09/2022   RPRTITER 1:4 (H) 10/31/2021   RPRTITER 1:4 (H) 09/20/2021   RPRTITER 1:16 (H) 04/30/2021    STI Results GC CT  01/09/2022  9:40 AM Negative    Negative    Negative   Negative    Negative    Negative   04/30/2021 11:11 AM Negative    Negative    Negative  Negative    Positive    Negative   11/24/2020 10:57 AM Negative    Negative    Negative  Negative    Negative    Negative   08/24/2020  8:42 AM Negative    Negative    Negative  Negative    Negative    Negative   06/07/2020 11:16 AM Negative    Negative    Negative  Negative    Negative    Negative   11/02/2019  1:46 PM Negative  Negative   06/16/2019  1:38 PM Negative    Positive    Negative  Negative    Positive    Negative   12/24/2018 12:00 AM Negative    Negative    Negative  Negative    Negative    Negative   07/17/2018 12:00 AM **POSITIVE**    Negative    Negative  **POSITIVE**    Negative    Negative   04/30/2018 12:00 AM Negative  Negative   01/23/2018 12:00 AM Negative    Negative  Negative    Negative   11/27/2017 12:00 AM Negative    Negative    Negative  Negative    Negative    Negative   09/07/2017 12:00 AM **POSITIVE**  Negative   05/05/2017 12:00 AM Negative  **POSITIVE**   01/22/2017 12:00 AM Negative    Negative    Negative  Negative    Negative    Negative   10/25/2016 12:00 AM Negative  **POSITIVE**   10/24/2016 12:00 AM Negative    Negative  Negative    Negative     Hepatitis B Lab Results  Component Value Date   HEPBSAB REACTIVE (A) 12/05/2021   HEPBSAG NON-REACTIVE 01/25/2020   HEPBCAB NON-REACTIVE 01/25/2020   Hepatitis C Lab Results  Component Value Date   HEPCAB NON-REACTIVE 07/02/2019   Hepatitis A Lab Results  Component Value Date   HAV Positive (A) 10/25/2016   Lipids: Lab Results  Component Value Date   CHOL 179 12/05/2021   TRIG 98 12/05/2021   HDL 49 12/05/2021   CHOLHDL 3.7 12/05/2021   VLDL 17 01/22/2017   LDLCALC 110 (H) 12/05/2021    TARGET DATE: The 26th  Assessment: Micheal Leonard presents today for his maintenance Cabenuva injections. Past injections were tolerated well without issues. Patient politely  declined STI testing today. Will defer HIV RNA testing to next appointment as this was just drawn at his last appointment. Counseled patient on the meningococcal vaccine as he is due for  a booster dose and he wishes to defer for now. Patient also wishes to defer the monkeypox vaccine at this time.   Administered cabotegravir 600mg /73mL in left upper outer quadrant of the gluteal muscle. Administered rilpivirine 900 mg/44mL in the right upper outer quadrant of the gluteal muscle. No issues with injections. He will follow up in 2 months for next set of injections.  Plan: - Cabenuva injections administered - Next injections scheduled for 12/04/22 with Dr. Tommy Medal and 02/05/23 with Cassie - Call with any issues or questions   Billey Gosling, PharmD PGY1 Pharmacy Resident 3/20/202410:14 AM

## 2022-10-25 ENCOUNTER — Telehealth: Payer: Self-pay

## 2022-10-25 NOTE — Telephone Encounter (Signed)
[facility] Patient Advocate Encounter  No Prior Authorization Required for Cabenuva ([id number]) through [employer].              Xuan Mateus, CPhT Specialty Pharmacy Patient [facility] Phone: [phone] Fax:  [phone]  

## 2022-11-28 ENCOUNTER — Other Ambulatory Visit (HOSPITAL_COMMUNITY): Payer: Self-pay

## 2022-11-29 ENCOUNTER — Other Ambulatory Visit (HOSPITAL_COMMUNITY): Payer: Self-pay

## 2022-12-02 ENCOUNTER — Telehealth: Payer: Self-pay

## 2022-12-02 ENCOUNTER — Other Ambulatory Visit (HOSPITAL_COMMUNITY): Payer: Self-pay

## 2022-12-02 ENCOUNTER — Other Ambulatory Visit: Payer: Self-pay

## 2022-12-02 ENCOUNTER — Other Ambulatory Visit: Payer: Self-pay | Admitting: Pharmacist

## 2022-12-02 DIAGNOSIS — B20 Human immunodeficiency virus [HIV] disease: Secondary | ICD-10-CM

## 2022-12-02 MED ORDER — CABOTEGRAVIR & RILPIVIRINE ER 600 & 900 MG/3ML IM SUER
1.0000 | INTRAMUSCULAR | 5 refills | Status: DC
  Filled 2022-12-02: qty 6, 56d supply, fill #0
  Filled 2022-12-02: qty 6, 60d supply, fill #0
  Filled 2023-01-28: qty 6, 56d supply, fill #1
  Filled 2023-03-31: qty 6, 56d supply, fill #2
  Filled 2023-05-19: qty 6, 56d supply, fill #3
  Filled 2023-07-18: qty 6, 56d supply, fill #4
  Filled 2023-09-16: qty 6, 56d supply, fill #5

## 2022-12-02 NOTE — Telephone Encounter (Signed)
Pharmacy Patient Advocate Encounter- Cabenuva BIV-Pharmacy Benefit:  PA was submitted to [employer] and has been approved through: 11/28/22-05/27/23 Authorization# [id number]  Please send prescription to Specialty Pharmacy: [facility]: [phone]  Estimated Copay is: 0.00

## 2022-12-03 ENCOUNTER — Telehealth: Payer: Self-pay

## 2022-12-03 NOTE — Progress Notes (Signed)
HPI: Micheal Leonard is a 27 y.o. male who presents to the [facility] pharmacy clinic for [place] administration.  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   CABOTEGRAVIR & RILPIVIRINE ER (CABENUVA) 600 & 900 MG/3ML INJECTION    Inject 1 kit into the muscle every 2 (two) months.   DOXYCYCLINE (VIBRA-TABS) 100 MG TABLET    Take 1 tablet by mouth 2 (two) times daily.   FEXOFENADINE (ALLEGRA) 180 MG TABLET    Take 180 mg by mouth daily.  Modified Medications   No medications on file  Discontinued Medications   No medications on file    Allergies: Allergies  Allergen Reactions   Other     An HIV drug and can not remember  exactly.    Past Medical History: Past Medical History:  Diagnosis Date   Cardiac anomaly, congenital    Diarrhea 01/22/2017   Dizziness 05/30/2020   EBV infection    hx/notes 10/23/2016   Erectile dysfunction 05/30/2020   Gonorrhea 07/20/2019   Hemoptysis 10/23/2016   Micheal Leonard 10/23/2016   HIV (human immunodeficiency virus infection) (HCC)    Hypertension    Hypertension 04/30/2021   Migraine    "a few/year now; frequency is less than in the past" (10/23/2016)   Nonadherence to medical treatment 09/08/2019    Pharyngitis 10/23/2016   notes 10/23/2016   Pulmonary embolism (H[facility]) 01/29/2016   S/P bidirectional Glenn shunt    hx/notes 10/23/2016   S/P Fontan procedure    hx/notes 10/23/2016   Syphilis 07/20/2019   Tricuspid atresia and stenosis, congenital     Social History: Social History   Socioeconomic History   Marital status: Significant Other    Spouse name: Not on file   Number of children: Not on file   Years of education: Not on file   Highest education level: Not on file  Occupational History   Not on file  Tobacco Use   Smoking status: Never   Smokeless tobacco: Never  Vaping Use   Vaping Use: Never used  Substance and Sexual Activity   Alcohol use: Not Currently    Comment: 2 GLASSES A WEEK   Drug use: Not Currently    Frequency: 2.0 times per week    Types: Marijuana   Sexual activity: Yes    Partners: Male    Birth control/protection: Condom    Comment: declined condoms  Other Topics Concern   Not on file  Social History Narrative   Not on file   Social Determinants of Health   Financial Resource Strain: Not on file  Food Insecurity: Not on file  Transportation Needs: Not on file  Physical Activity: Not on file  Stress: Not on file  Social Connections: Not on  file    Labs: Lab Results  Component Value Date   HIV1RNAQUANT Not Detected 08/07/2022   HIV1RNAQUANT Not Detected 05/08/2022   HIV1RNAQUANT <20 (H) 04/10/2022   CD4TABS 267 (L) 05/08/2022   CD4TABS 288 (L) 03/06/2022   CD4TABS 276 (L) 01/09/2022    RPR and STI Lab Results  Component Value Date   LABRPR REACTIVE (A) 04/10/2022   LABRPR REACTIVE (A) 01/09/2022   LABRPR REACTIVE (A) 10/31/2021   LABRPR REACTIVE (A) 09/20/2021   LABRPR REACTIVE (A) 04/30/2021   RPRTITER 1:4 (H) 04/10/2022   RPRTITER 1:4 (H) 01/09/2022   RPRTITER 1:4 (H) 10/31/2021   RPRTITER 1:4 (H) 09/20/2021   RPRTITER 1:16 (H) 04/30/2021    STI Results GC CT  01/09/2022  9:40 AM Negative    Negative    Negative   Negative    Negative    Negative   04/30/2021 11:11 AM Negative    Negative    Negative  Negative    Positive    Negative   11/24/2020 10:57 AM Negative    Negative    Negative  Negative    Negative    Negative   08/24/2020  8:42 AM Negative    Negative    Negative  Negative    Negative    Negative   06/07/2020 11:16 AM Negative    Negative    Negative  Negative    Negative    Negative   11/02/2019  1:46 PM Negative  Negative   06/16/2019  1:38 PM Negative    Positive    Negative  Negative    Positive    Negative   12/24/2018 12:00 AM Negative    Negative    Negative  Negative    Negative    Negative   07/17/2018 12:00 AM **POSITIVE**    Negative    Negative  **POSITIVE**    Negative    Negative   04/30/2018 12:00 AM Negative  Negative   01/23/2018 12:00 AM Negative    Negative  Negative    Negative   11/27/2017 12:00 AM Negative    Negative    Negative  Negative    Negative    Negative   09/07/2017 12:00 AM **POSITIVE**  Negative   05/05/2017 12:00 AM Negative  **POSITIVE**   01/22/2017 12:00 AM Negative    Negative    Negative  Negative    Negative    Negative   10/25/2016 12:00 AM Negative  **POSITIVE**   10/24/2016 12:00 AM Negative    Negative  Negative    Negative     Hepatitis B Lab Results  Component Value Date   HEPBSAB REACTIVE (A) 12/05/2021   HEPBSAG NON-REACTIVE 01/25/2020   HEPBCAB NON-REACTIVE 01/25/2020   Hepatitis C Lab Results  Component Value Date   HEPCAB NON-REACTIVE 07/02/2019   Hepatitis A Lab Results  Component Value Date   HAV Positive (A) 10/25/2016   Lipids: Lab Results  Component Value Date   CHOL 179 12/05/2021   TRIG 98 12/05/2021   HDL 49 12/05/2021   CHOLHDL 3.7 12/05/2021   VLDL 17 01/22/2017   LDLCALC 110 (H) 12/05/2021    TARGET DATE: The 26th  Assessment: Micheal Leonard presents today for his maintenance Cabenuva injections. Past injections were tolerated well without issues. He presents with  his Child psychotherapist for the visit today, Micheal Leonard. Patient reports a new partner since last visit. No STI symptoms reported. Agrees to full STI testing today with RPR and oral/urine/rectal cytologies.  Administered cabotegravir 600mg /2mL in left upper outer quadrant of the gluteal muscle. Administered rilpivirine 900 mg/39mL in the right upper outer quadrant of the gluteal muscle. No issues with injections. He will follow up in 2 months for next set of injections. He is eligible for multiple vaccinations today. He would like to hold off and receive any immunizations at a visit when he does not get Cabenuva.   Plan: - Cabenuva injections administered - Obtain HIV RNA, RPR and oral/urine/rectal cytologies.  - Next injections scheduled for 02/12/23 with Dr. [name] and 04/08/23 with Cassie - Call with any issues or questions  Irish Elders, PharmD PGY-1 [facility] Pharmacy Resident

## 2022-12-03 NOTE — Telephone Encounter (Signed)
[facility] Patient Advocate Encounter  Patient's medication [name]) have been couriered to [facility] from [employer] and will be administered on the patient next office visit on 12/04/22.  Clearance Coots , CPhT Specialty Pharmacy Patient [facility] for Infectious Disease Phone: [phone] Fax:  [phone]

## 2022-12-04 ENCOUNTER — Other Ambulatory Visit (HOSPITAL_COMMUNITY)
Admission: RE | Admit: 2022-12-04 | Discharge: 2022-12-04 | Disposition: A | Discharge status: Home / Self Care | Payer: 59 | Source: Ambulatory Visit | Attending: Infectious Disease | Admitting: Infectious Disease

## 2022-12-04 ENCOUNTER — Other Ambulatory Visit: Payer: Self-pay

## 2022-12-04 ENCOUNTER — Ambulatory Visit (INDEPENDENT_AMBULATORY_CARE_PROVIDER_SITE_OTHER): Payer: 59 | Admitting: Pharmacist

## 2022-12-04 ENCOUNTER — Ambulatory Visit: Payer: 59 | Admitting: Infectious Disease

## 2022-12-04 DIAGNOSIS — Z113 Encounter for screening for infections with a predominantly sexual mode of transmission: Secondary | ICD-10-CM | POA: Diagnosis present

## 2022-12-04 DIAGNOSIS — B20 Human immunodeficiency virus [HIV] disease: Secondary | ICD-10-CM | POA: Diagnosis not present

## 2022-12-04 MED ORDER — CABOTEGRAVIR & RILPIVIRINE ER 600 & 900 MG/3ML IM SUER
1.0000 | Freq: Once | INTRAMUSCULAR | Status: AC
Start: 2022-12-04 — End: 2022-12-04
  Administered 2022-12-04: 1 via INTRAMUSCULAR

## 2022-12-05 LAB — RPR: RPR Ser Ql: REACTIVE — AB

## 2022-12-06 LAB — CYTOLOGY, (ORAL, ANAL, URETHRAL) ANCILLARY ONLY
Chlamydia: NEGATIVE
Chlamydia: NEGATIVE
Comment: NEGATIVE
Comment: NEGATIVE
Comment: NORMAL
Comment: NORMAL
Neisseria Gonorrhea: NEGATIVE
Neisseria Gonorrhea: NEGATIVE

## 2022-12-06 LAB — URINE CYTOLOGY ANCILLARY ONLY
Chlamydia: NEGATIVE
Comment: NEGATIVE
Comment: NORMAL
Neisseria Gonorrhea: NEGATIVE

## 2022-12-06 LAB — T PALLIDUM AB: T Pallidum Abs: POSITIVE — AB

## 2022-12-07 LAB — HIV-1 RNA QUANT-NO REFLEX-BLD
HIV 1 RNA Quant: NOT DETECTED Copies/mL
HIV-1 RNA Quant, Log: NOT DETECTED Log cps/mL

## 2022-12-07 LAB — RPR TITER: RPR Titer: 1:4 {titer} — ABNORMAL HIGH

## 2022-12-20 ENCOUNTER — Encounter: Payer: Self-pay | Admitting: Infectious Disease

## 2023-01-28 ENCOUNTER — Other Ambulatory Visit (HOSPITAL_COMMUNITY): Payer: Self-pay

## 2023-01-31 ENCOUNTER — Other Ambulatory Visit (HOSPITAL_COMMUNITY): Payer: Self-pay

## 2023-02-04 ENCOUNTER — Telehealth: Payer: Self-pay

## 2023-02-04 NOTE — Telephone Encounter (Signed)
[facility] Patient Advocate Encounter  Patient's medication Micheal Leonard) have been couriered to [facility] from [employer] and will be administered on the patient next office visit on 02/12/23.  Micheal Leonard , CPhT Specialty Pharmacy Patient [facility] for Infectious Disease Phone: [phone] Fax:  [phone]

## 2023-02-05 ENCOUNTER — Encounter: Payer: Self-pay | Admitting: Pharmacist

## 2023-02-12 ENCOUNTER — Other Ambulatory Visit (HOSPITAL_COMMUNITY)
Admission: RE | Admit: 2023-02-12 | Discharge: 2023-02-12 | Disposition: A | Discharge status: Home / Self Care | Payer: Medicaid Other | Source: Ambulatory Visit | Attending: Infectious Disease | Admitting: Infectious Disease

## 2023-02-12 ENCOUNTER — Other Ambulatory Visit: Payer: Self-pay

## 2023-02-12 ENCOUNTER — Ambulatory Visit: Payer: Medicaid Other | Admitting: Infectious Disease

## 2023-02-12 ENCOUNTER — Encounter: Payer: Self-pay | Admitting: Infectious Disease

## 2023-02-12 ENCOUNTER — Other Ambulatory Visit (HOSPITAL_COMMUNITY): Payer: Self-pay

## 2023-02-12 VITALS — BP 160/103 | HR 89 | Resp 16 | Ht 73.0 in | Wt 215.0 lb

## 2023-02-12 DIAGNOSIS — A539 Syphilis, unspecified: Secondary | ICD-10-CM | POA: Insufficient documentation

## 2023-02-12 DIAGNOSIS — B20 Human immunodeficiency virus [HIV] disease: Secondary | ICD-10-CM | POA: Insufficient documentation

## 2023-02-12 DIAGNOSIS — I10 Essential (primary) hypertension: Secondary | ICD-10-CM | POA: Diagnosis not present

## 2023-02-12 DIAGNOSIS — F819 Developmental disorder of scholastic skills, unspecified: Secondary | ICD-10-CM | POA: Diagnosis not present

## 2023-02-12 DIAGNOSIS — Z9889 Other specified postprocedural states: Secondary | ICD-10-CM | POA: Diagnosis not present

## 2023-02-12 DIAGNOSIS — Z8774 Personal history of (corrected) congenital malformations of heart and circulatory system: Secondary | ICD-10-CM

## 2023-02-12 MED ORDER — EMPAGLIFLOZIN 10 MG PO TABS
10.0000 mg | ORAL_TABLET | Freq: Every day | ORAL | 11 refills | Status: DC
  Filled 2023-02-12: qty 30, 30d supply, fill #0

## 2023-02-12 MED ORDER — CABOTEGRAVIR & RILPIVIRINE ER 600 & 900 MG/3ML IM SUER
1.0000 | Freq: Once | INTRAMUSCULAR | Status: AC
Start: 2023-02-12 — End: 2023-02-12
  Administered 2023-02-12: 1 via INTRAMUSCULAR

## 2023-02-12 MED ORDER — AMLODIPINE BESYLATE 5 MG PO TABS
5.0000 mg | ORAL_TABLET | Freq: Every day | ORAL | 11 refills | Status: DC
  Filled 2023-02-12: qty 30, 30d supply, fill #0

## 2023-02-12 NOTE — Progress Notes (Signed)
Subjective:  Complaint planing with some dizziness and lightheadedness at times  Patient ID: Micheal Leonard, male    DOB: Nov 13, 1995, 27 y.o.   MRN: [id number]  HPI  27year-old black man with HIV that was previously well controlled but not so much recently.  We did in fact enroll him into the latitude study but he was not able to maintain virological suppression.  We decided ultimately to give him Cabenuva off label while he was viremic and after initiation of these injections he has been undetectable every time we have been checking his viral load at his every monthly Cabenuva visits.  He is now anxious to switch over to every 2 month visits I think that is quite reasonable as long as we have appropriately dosed him with a higher dose for the first 2 months.  Has been happy with Cabenuva.  He saw cardiology in January and he claims that they stopped his amlodipine at that visit I was at [facility] I do not see recording of amlodipine having been stopped I see a note from his cardiologist mentioning that Kidd had stopped an ACE inhibitor due to his symptoms of dizziness and due to the fact that he thought it was due to the ACE inhibitor.  I also saw that he was prescribed Jardiance but I do not see that on as he is medical record here.      Past Medical History:  Diagnosis Date   Cardiac anomaly, congenital    Diarrhea 01/22/2017   Dizziness 05/30/2020   EBV infection    hx/notes 10/23/2016   Erectile dysfunction 05/30/2020   Gonorrhea 07/20/2019   Hemoptysis 10/23/2016   [name] 10/23/2016   HIV (human immunodeficiency virus infection) (HCC)    Hypertension    Hypertension 04/30/2021   Migraine    "a few/year now; frequency is less than in the past" (10/23/2016)   Nonadherence to medical treatment 09/08/2019   Pharyngitis 10/23/2016   notes 10/23/2016   Pulmonary embolism (HCC) 01/29/2016   S/P bidirectional Glenn shunt    hx/notes 10/23/2016   S/P Fontan  procedure    hx/notes 10/23/2016   Syphilis 07/20/2019   Tricuspid atresia and stenosis, congenital     Past Surgical History:  Procedure Laterality Date   CARDIAC CATHETERIZATION  01/2016   at [facility]; hx/notes 10/23/2016   CARDIAC SURGERY  01/1997; 2000   fontane procedure at [facility] as a child     Family History  Problem Relation Age of Onset   Hypertension Father       Social History   Socioeconomic History   Marital status: Significant Other    Spouse name: Not on file   Number of children: Not on file   Years of education: Not on file   Highest education level: Not on file  Occupational History   Not on file  Tobacco Use   Smoking status: Never   Smokeless tobacco: Never  Vaping Use   Vaping status: Never Used  Substance and Sexual Activity   Alcohol use: Not Currently    Comment: 2 GLASSES A WEEK   Drug use: Not Currently    Frequency: 2.0 times per week    Types: Marijuana   Sexual activity: Yes    Partners: Male    Birth control/protection: Condom    Comment: declined condoms  Other Topics Concern   Not on file  Social History Narrative   Not on file   Social Determinants of Health  Financial Resource Strain: Not on file  Food Insecurity: Not on file  Transportation Needs: Not on file  Physical Activity: Not on file  Stress: Not on file  Social Connections: Not on file    Allergies  Allergen Reactions   Other     An HIV drug and can not remember  exactly.     Current Outpatient Medications:    amLODipine (NORVASC) 5 MG tablet, Take 1 tablet (5 mg total) by mouth daily., Disp: 30 tablet, Rfl: 11   cabotegravir & rilpivirine ER (CABENUVA) 600 & 900 MG/3ML injection, Inject 1 kit into the muscle every 2 (two) months., Disp: 6 mL, Rfl: 5   doxycycline (VIBRA-TABS) 100 MG tablet, Take 1 tablet by mouth 2 (two) times daily. (Patient not taking: Reported on 01/09/2022), Disp: 28 tablet, Rfl: 0   fexofenadine (ALLEGRA) 180 MG tablet, Take 180 mg by mouth  daily. (Patient not taking: Reported on 05/08/2022), Disp: , Rfl:    Review of Systems  Constitutional:  Negative for activity change, appetite change, chills, diaphoresis, fatigue, fever and unexpected weight change.  HENT:  Negative for congestion, rhinorrhea, sinus pressure, sneezing, sore throat and trouble swallowing.   Eyes:  Negative for photophobia and visual disturbance.  Respiratory:  Negative for cough, chest tightness, shortness of breath, wheezing and stridor.   Cardiovascular:  Negative for chest pain, palpitations and leg swelling.  Gastrointestinal:  Negative for abdominal distention, abdominal pain, anal bleeding, blood in stool, constipation, diarrhea, nausea and vomiting.  Genitourinary:  Negative for difficulty urinating, dysuria, flank pain and hematuria.  Musculoskeletal:  Negative for arthralgias, back pain, gait problem, joint swelling and myalgias.  Skin:  Negative for color change, pallor, rash and wound.  Neurological:  Positive for dizziness and headaches. Negative for tremors, weakness and light-headedness.  Hematological:  Negative for adenopathy. Does not bruise/bleed easily.  Psychiatric/Behavioral:  Negative for agitation, behavioral problems, confusion, decreased concentration, dysphoric mood, sleep disturbance and suicidal ideas.        Objective:   Physical Exam Constitutional:      Appearance: He is well-developed.  HENT:     Head: Normocephalic and atraumatic.  Eyes:     Conjunctiva/sclera: Conjunctivae normal.  Cardiovascular:     Rate and Rhythm: Normal rate and regular rhythm.  Pulmonary:     Effort: Pulmonary effort is normal. No respiratory distress.     Breath sounds: No wheezing.  Abdominal:     General: There is no distension.     Palpations: Abdomen is soft.  Musculoskeletal:        General: No tenderness. Normal range of motion.     Cervical back: Normal range of motion and neck supple.  Skin:    General: Skin is warm and dry.      Coloration: Skin is not pale.     Findings: No erythema or rash.  Neurological:     General: No focal deficit present.     Mental Status: He is alert and oriented to person, place, and time.  Psychiatric:        Mood and Affect: Mood normal.        Speech: Speech is delayed.        Behavior: Behavior normal.        Thought Content: Thought content normal.        Judgment: Judgment normal.           Assessment & Plan:   HIV disease:  CD4 nadir 180  I will add  order HIV viral load CD4 count CBC with differential CMP, RPR GC and chlamydia and I will continue  Langley Brenton's  Q2M  Cabenuva   STI screening: Screening for gonorrhea chlamydia and syphilis with sampling of of oropharynx rectum urine as well as serum  Hypertension:  Poorly controlled I have really started his amlodipine prescription as well as prescription for Jardiance   We have bidirectional Glenn shunt and Fontan procedure: Needs to follow-up with cardiology at [facility].  Cognitive [place]: Continuing to be brought to clinic by Paramedic and also at times by family members

## 2023-02-12 NOTE — Addendum Note (Signed)
Addended by: Clayborne Artist A on: 02/12/2023 03:54 PM   Modules accepted: Orders

## 2023-02-13 ENCOUNTER — Encounter: Payer: Self-pay | Admitting: Pharmacist

## 2023-02-13 ENCOUNTER — Other Ambulatory Visit: Payer: Self-pay

## 2023-02-18 ENCOUNTER — Other Ambulatory Visit: Payer: Self-pay

## 2023-02-19 ENCOUNTER — Other Ambulatory Visit: Payer: Self-pay | Admitting: Infectious Disease

## 2023-02-19 DIAGNOSIS — R85610 Atypical squamous cells of undetermined significance on cytologic smear of anus (ASC-US): Secondary | ICD-10-CM

## 2023-02-21 ENCOUNTER — Telehealth: Payer: Self-pay

## 2023-02-21 NOTE — Telephone Encounter (Signed)
Teshawn called with his mom on the line with questions regarding his referral to [facility]. He was under the impression that he needed surgery, discussed his anal pap results and the need for additional testing. Will also send additional resources via MyChart.   Sandie Ano, RN

## 2023-02-24 ENCOUNTER — Ambulatory Visit: Payer: Self-pay | Admitting: General Surgery

## 2023-02-24 DIAGNOSIS — R85610 Atypical squamous cells of undetermined significance on cytologic smear of anus (ASC-US): Secondary | ICD-10-CM | POA: Diagnosis not present

## 2023-02-24 NOTE — H&P (Signed)
REFERRING PHYSICIAN:  Daiva Leonard, [address]  PROVIDER:  Elenora Gamma, MD  MRN: [id number] DOB: 1996-04-23 DATE OF ENCOUNTER: 02/24/2023  Subjective   Chief Complaint: New Consultation     History of Present Illness: Micheal Leonard is a 27 y.o. HIV positive male who is seen today as an office consultation at the request of Dr. Daiva Leonard for evaluation of New Consultation .  Presents to the office after being evaluated by anal Pap.  This showed atypical squamous cells of undetermined significance.  He is here today for further evaluation.  HIV titers are undetectable.  CD4 384   Review of Systems: A complete review of systems was obtained from the patient.  I have reviewed this information and discussed as appropriate with the patient.  See HPI as well for other ROS.     Medical History: Past Medical History:  Diagnosis Date   Glomerulonephritis, acute 01/29/2016   Tea colored urine during hospitalization in July 2017. C3 normal. Urine culture negative. 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)   VSD (ventricular septal defect) (HHS-HCC)   S/P bidirectional Glenn shunt   S/P Fontan procedure   Abdominal pain, generalized   Epstein-Barr virus seropositivity   Glomerulonephritis, acute   Cognitive developmental delay   HIV -AIDS with opportunistic infection, Symptomatic (CMS/HHS-HCC)   Essential hypertension   Left ventricular dysfunction   Diarrhea   Dilated cardiomyopathy (CMS/HHS-HCC)    Past Surgical History:  Procedure Laterality Date   ESOPHAGOGASTRODOUDENOSCOPY W/BIOPSY N/A 02/05/2016    Procedure: ESOPHAGOGASTRODUODENOSCOPY, FLEXIBLE, TRANSORAL; WITH BIOPSY, SINGLE OR MULTIPLE;  Surgeon: Micheal Dooms, MD;  Location: [facility];  Service: Gastroenterology;  Laterality: N/A;   COLONOSCOPY W/BIOPSY N/A 02/05/2016   Procedure: COLONOSCOPY, FLEXIBLE; WITH BIOPSY, SINGLE OR MULTIPLE;  Surgeon: Micheal Dooms, MD;  Location: [facility];  Service: Gastroenterology;  Laterality: N/A;   VASCULAR SURGERY       No Known Allergies  Current Outpatient Medications on File Prior to Visit  Medication Sig Dispense Refill   cabotegravir-rilpivirine (CABENUVA) 600 mg/3 mL- 900 mg/3 mL IM injection Inject into the muscle     aspirin 81 MG EC tablet Take 81 mg by mouth once daily. Reported on 11/13/2015  (Patient not taking: Reported on 02/24/2023)     aspirin-acetaminophen-caffeine (EXCEDRIN MIGRAINE) 250-250-65 mg per tablet Take by mouth. (Patient not taking: Reported on 02/24/2023)     BIKTARVY 50-200-25 mg Tab  (Patient not taking: Reported on 07/25/2022)  5   empagliflozin (JARDIANCE) 10 mg tablet Take 1 tablet (10 mg total) by mouth once daily (Patient not taking: Reported on 02/24/2023) 30 tablet 11   fexofenadine (ALLEGRA) 60 MG tablet Take 60 mg by mouth once daily as needed. (Patient not taking: Reported on 02/24/2023)     montelukast (SINGULAIR) 10 mg tablet Take 10 mg by mouth once daily as needed. Reported on 11/13/2015  (Patient not taking: Reported on 07/25/2022)  0   polyethylene glycol (MIRALAX) packet Take 1 packet (17 g total) by mouth two times daily for 2 weeks. Then once a day for a month. Mix in 4-8ounces of fluid  prior to taking. (Patient not taking: Reported on 02/24/2023) 72 each 0   No current facility-administered medications on file prior to visit.    Family History  Problem Relation Age of Onset   Pacemaker Paternal Aunt    High blood pressure (Hypertension) Father      Social History   Tobacco Use  Smoking Status Never   Smokeless Tobacco Never     Social History   Socioeconomic History   Marital status: Single  Tobacco Use   Smoking status: Never   Smokeless tobacco: Never  Substance and Sexual Activity   Alcohol use: Yes   Drug use: No   Sexual activity: Yes    Partners: Male    Birth control/protection: Condom    Objective:    Vitals:   02/24/23 1610 02/24/23 1611  Pulse: 103   Temp: 36.9 C (98.4 F)   SpO2: 92%   Weight: 98.5 kg (217 lb 3.2 oz)   Height: 185.4 cm (6\' 1" )   PainSc:  0-No pain  PainLoc:  Rectum     Exam Gen: NAD Abd: soft    Labs, Imaging and Diagnostic Testing:   Assessment and Plan:  Diagnoses and all orders for this visit:  Pap smear of anus with ASCUS  27 year old male who presents to the office for evaluation of recent abnormal anal Pap test. I recommended high-resolution anoscopy.  We discussed this in detail.  We discussed possible ablation or biopsy.  We discussed possibility of postoperative pain and bleeding.  We discussed the possibility of needing to take some time off work.  All questions were answered.  Given his heart issues, I recommended preoperative antibiotics.     Micheal Panda, MD [facility]

## 2023-03-19 DIAGNOSIS — I420 Dilated cardiomyopathy: Secondary | ICD-10-CM | POA: Diagnosis not present

## 2023-03-19 DIAGNOSIS — Z9889 Other specified postprocedural states: Secondary | ICD-10-CM | POA: Diagnosis not present

## 2023-03-19 DIAGNOSIS — I10 Essential (primary) hypertension: Secondary | ICD-10-CM | POA: Diagnosis not present

## 2023-03-19 DIAGNOSIS — I119 Hypertensive heart disease without heart failure: Secondary | ICD-10-CM | POA: Diagnosis not present

## 2023-03-19 DIAGNOSIS — Q224 Congenital tricuspid stenosis: Secondary | ICD-10-CM | POA: Diagnosis not present

## 2023-03-19 DIAGNOSIS — Z48812 Encounter for surgical aftercare following surgery on the circulatory system: Secondary | ICD-10-CM | POA: Diagnosis not present

## 2023-03-19 DIAGNOSIS — Z79899 Other long term (current) drug therapy: Secondary | ICD-10-CM | POA: Diagnosis not present

## 2023-03-19 DIAGNOSIS — B20 Human immunodeficiency virus [HIV] disease: Secondary | ICD-10-CM | POA: Diagnosis not present

## 2023-03-19 DIAGNOSIS — I519 Heart disease, unspecified: Secondary | ICD-10-CM | POA: Diagnosis not present

## 2023-03-31 ENCOUNTER — Other Ambulatory Visit (HOSPITAL_COMMUNITY): Payer: Self-pay

## 2023-04-02 ENCOUNTER — Other Ambulatory Visit: Payer: Self-pay

## 2023-04-03 ENCOUNTER — Telehealth: Payer: Self-pay

## 2023-04-03 DIAGNOSIS — I420 Dilated cardiomyopathy: Secondary | ICD-10-CM | POA: Diagnosis not present

## 2023-04-03 DIAGNOSIS — Z9889 Other specified postprocedural states: Secondary | ICD-10-CM | POA: Diagnosis not present

## 2023-04-03 DIAGNOSIS — I519 Heart disease, unspecified: Secondary | ICD-10-CM | POA: Diagnosis not present

## 2023-04-03 DIAGNOSIS — Q224 Congenital tricuspid stenosis: Secondary | ICD-10-CM | POA: Diagnosis not present

## 2023-04-03 NOTE — Telephone Encounter (Signed)
[facility] Patient Advocate Encounter  Patient's medication [name]) have been couriered to [facility] from [facility] and will be administered on the patient next office visit on 04/07/23.  Clearance Coots , CPhT Specialty Pharmacy Patient [facility] for Infectious Disease Phone: [phone] Fax:  [phone]

## 2023-04-07 NOTE — Progress Notes (Unsigned)
HPI: Micheal Leonard is a 27 y.o. male who presents to the [facility] pharmacy clinic for [place] administration.  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   AMLODIPINE (NORVASC) 5 MG TABLET    Take 1 tablet (5 mg total) by mouth daily.   CABOTEGRAVIR & RILPIVIRINE ER (CABENUVA) 600 & 900 MG/3ML INJECTION    Inject 1 kit into the muscle every 2 (two) months.   DOXYCYCLINE (VIBRA-TABS) 100 MG TABLET    Take 1 tablet by mouth 2 (two) times daily.   EMPAGLIFLOZIN (JARDIANCE) 10 MG TABS TABLET    Take 1 tablet (10 mg total) by mouth daily before breakfast.   FEXOFENADINE (ALLEGRA) 180 MG TABLET    Take 180 mg by mouth daily.  Modified Medications   No medications on file  Discontinued Medications   No medications on file    Allergies: Allergies  Allergen Reactions   Other     An HIV drug and can not remember  exactly.    Labs: Lab Results  Component Value Date   HIV1RNAQUANT Not Detected 02/12/2023   HIV1RNAQUANT Not Detected 12/04/2022   HIV1RNAQUANT Not Detected 08/07/2022   CD4TABS 384 (L) 02/12/2023   CD4TABS 267 (L) 05/08/2022   CD4TABS 288 (L) 03/06/2022    RPR and STI Lab Results  Component Value Date   LABRPR  REACTIVE (A) 02/12/2023   LABRPR REACTIVE (A) 12/04/2022   LABRPR REACTIVE (A) 04/10/2022   LABRPR REACTIVE (A) 01/09/2022   LABRPR REACTIVE (A) 10/31/2021   RPRTITER 1:2 (H) 02/12/2023   RPRTITER 1:4 (H) 12/04/2022   RPRTITER 1:4 (H) 04/10/2022   RPRTITER 1:4 (H) 01/09/2022   RPRTITER 1:4 (H) 10/31/2021    STI Results GC CT  02/12/2023  2:08 PM Negative    Negative    Negative  Negative    Negative    Negative   12/04/2022  2:02 PM Negative    Negative    Negative  Negative    Negative    Negative   01/09/2022  9:40 AM Negative    Negative    Negative  Negative    Negative    Negative   04/30/2021 11:11 AM Negative    Negative    Negative  Negative    Positive    Negative   11/24/2020 10:57 AM Negative    Negative    Negative  Negative    Negative    Negative   08/24/2020  8:42 AM Negative    Negative    Negative  Negative    Negative    Negative   06/07/2020 11:16 AM Negative    Negative    Negative  Negative    Negative    Negative  11/02/2019  1:46 PM Negative  Negative   06/16/2019  1:38 PM Negative    Positive    Negative  Negative    Positive    Negative   12/24/2018 12:00 AM Negative    Negative    Negative  Negative    Negative    Negative   07/17/2018 12:00 AM **POSITIVE**    Negative    Negative  **POSITIVE**    Negative    Negative   04/30/2018 12:00 AM Negative  Negative   01/23/2018 12:00 AM Negative    Negative  Negative    Negative   11/27/2017 12:00 AM Negative    Negative    Negative  Negative    Negative    Negative   09/07/2017 12:00 AM **POSITIVE**  Negative   05/05/2017 12:00 AM Negative  **POSITIVE**   01/22/2017 12:00 AM Negative    Negative    Negative  Negative    Negative    Negative   10/25/2016 12:00 AM Negative  **POSITIVE**   10/24/2016 12:00 AM Negative    Negative  Negative    Negative     Hepatitis B Lab Results  Component Value Date   HEPBSAB REACTIVE (A) 12/05/2021   HEPBSAG NON-REACTIVE  01/25/2020   HEPBCAB NON-REACTIVE 01/25/2020   Hepatitis C Lab Results  Component Value Date   HEPCAB NON-REACTIVE 07/02/2019   Hepatitis A Lab Results  Component Value Date   HAV Positive (A) 10/25/2016   Lipids: Lab Results  Component Value Date   CHOL 179 12/05/2021   TRIG 98 12/05/2021   HDL 49 12/05/2021   CHOLHDL 3.7 12/05/2021   VLDL 17 01/22/2017   LDLCALC 110 (H) 12/05/2021    TARGET DATE: 27th of the month  Assessment: Aqeel presents today for maintenance Cabenuva injections. Past injections were tolerated well without issues. Last HIV RNA was undetectable. Discussed COVID and influenza vaccinations today, he is amenable to these.  Administered cabotegravir 600mg /65mL in left upper outer quadrant of the gluteal muscle. Administered rilpivirine 900 mg/8mL in the right upper outer quadrant of the gluteal muscle. No issues with injections. Nakoa will follow up in 2 months for next set of injections.  Plan: - Cabenuva injections administered - Next injections scheduled for 06/11/23 with Margarite Gouge, PharmD, and 08/04/22 with Dr. [name] - Administered 2024 influenza and COVID19 shots today - Call with any issues or questions  Lora Paula, PharmD PGY-2 Infectious Diseases Pharmacy Resident 04/07/2023 7:41 PM

## 2023-04-08 ENCOUNTER — Other Ambulatory Visit: Payer: Self-pay

## 2023-04-08 ENCOUNTER — Ambulatory Visit (INDEPENDENT_AMBULATORY_CARE_PROVIDER_SITE_OTHER): Payer: Medicaid Other | Admitting: Pharmacist

## 2023-04-08 DIAGNOSIS — B20 Human immunodeficiency virus [HIV] disease: Secondary | ICD-10-CM | POA: Diagnosis present

## 2023-04-08 DIAGNOSIS — Z23 Encounter for immunization: Secondary | ICD-10-CM | POA: Diagnosis not present

## 2023-04-08 MED ORDER — CABOTEGRAVIR & RILPIVIRINE ER 600 & 900 MG/3ML IM SUER
1.0000 | Freq: Once | INTRAMUSCULAR | Status: AC
Start: 2023-04-08 — End: 2023-04-08
  Administered 2023-04-08: 1 via INTRAMUSCULAR

## 2023-04-23 DIAGNOSIS — R6889 Other general symptoms and signs: Secondary | ICD-10-CM | POA: Diagnosis not present

## 2023-04-23 DIAGNOSIS — U071 COVID-19: Secondary | ICD-10-CM | POA: Diagnosis not present

## 2023-04-23 DIAGNOSIS — R030 Elevated blood-pressure reading, without diagnosis of hypertension: Secondary | ICD-10-CM | POA: Diagnosis not present

## 2023-05-14 ENCOUNTER — Emergency Department (HOSPITAL_COMMUNITY)
Admission: EM | Admit: 2023-05-14 | Discharge: 2023-05-15 | Discharge status: Against Medical Advice | Payer: Medicaid Other | Attending: Emergency Medicine | Admitting: Emergency Medicine

## 2023-05-14 ENCOUNTER — Other Ambulatory Visit: Payer: Self-pay

## 2023-05-14 ENCOUNTER — Encounter (HOSPITAL_COMMUNITY): Payer: Self-pay

## 2023-05-14 DIAGNOSIS — R519 Headache, unspecified: Secondary | ICD-10-CM | POA: Insufficient documentation

## 2023-05-14 DIAGNOSIS — R030 Elevated blood-pressure reading, without diagnosis of hypertension: Secondary | ICD-10-CM | POA: Diagnosis not present

## 2023-05-14 DIAGNOSIS — Z5321 Procedure and treatment not carried out due to patient leaving prior to being seen by health care provider: Secondary | ICD-10-CM | POA: Insufficient documentation

## 2023-05-14 LAB — CBC
HCT: 47.6 % (ref 39.0–52.0)
Hemoglobin: 16.2 g/dL (ref 13.0–17.0)
MCH: 29.7 pg (ref 26.0–34.0)
MCHC: 34 g/dL (ref 30.0–36.0)
MCV: 87.2 fL (ref 80.0–100.0)
Platelets: 247 10*3/uL (ref 150–400)
RBC: 5.46 MIL/uL (ref 4.22–5.81)
RDW: 12.8 % (ref 11.5–15.5)
WBC: 4.9 10*3/uL (ref 4.0–10.5)
nRBC: 0 % (ref 0.0–0.2)

## 2023-05-14 NOTE — ED Triage Notes (Signed)
Pt is coming in for hypertension that has been an ongoing issue throughout his life but the past few months he has been seeing his pcp which prescribed him bp meds this past month. He reports at home his blood pressure was 200/120. He also endorses having headaches with his elevated blood pressure but no other symptoms at this time.

## 2023-05-15 LAB — BASIC METABOLIC PANEL
Anion gap: 12 (ref 5–15)
BUN: 11 mg/dL (ref 6–20)
CO2: 18 mmol/L — ABNORMAL LOW (ref 22–32)
Calcium: 9.4 mg/dL (ref 8.9–10.3)
Chloride: 106 mmol/L (ref 98–111)
Creatinine, Ser: 1.07 mg/dL (ref 0.61–1.24)
GFR, Estimated: >60 mL/min (ref >60)
Glucose, Bld: 125 mg/dL — ABNORMAL HIGH (ref 70–99)
Potassium: 3.6 mmol/L (ref 3.5–5.1)
Sodium: 136 mmol/L (ref 135–145)

## 2023-05-15 LAB — TROPONIN I (HIGH SENSITIVITY)
Troponin I (High Sensitivity): 16 ng/L (ref <18)
Troponin I (High Sensitivity): 17 ng/L (ref <18)

## 2023-05-15 NOTE — ED Notes (Signed)
Pt called to esign MSE form, no answer

## 2023-05-15 NOTE — ED Notes (Signed)
Pt called x3 and no answer, pt taken OTF.

## 2023-05-19 ENCOUNTER — Telehealth: Payer: Self-pay

## 2023-05-19 ENCOUNTER — Other Ambulatory Visit: Payer: Self-pay

## 2023-05-19 ENCOUNTER — Other Ambulatory Visit (HOSPITAL_COMMUNITY): Payer: Self-pay

## 2023-05-19 NOTE — Telephone Encounter (Signed)
[facility] Patient Advocate Encounter   Received notification from [facility] that prior authorization for [name] is required. (Pharmacy Benefits)   PA submitted on 05/29/23 Key [id number] Status is pending    [facility] Clinic will continue to follow.   Clearance Coots, CPhT Specialty Pharmacy Patient [facility] for Infectious Disease Phone: [phone] Fax:  [phone]

## 2023-05-19 NOTE — Telephone Encounter (Signed)
Pharmacy Patient Advocate Encounter- Cabenuva BIV-Pharmacy Benefit:  PA was submitted to [facility] and has been approved through: 05/19/23- 05/18/24 Authorization# [id number]  Please send prescription to Specialty Pharmacy: [facility]: [phone]  Estimated Copay is: 0.00

## 2023-05-19 NOTE — Progress Notes (Signed)
Specialty Pharmacy Refill Coordination Note  Nefi Musich is a 27 y.o. male assessed today regarding refills of clinic administered specialty medication(s) Cabotegravir & Rilpivirine   Clinic requested Courier to Provider Office   Delivery date: 05/27/23   Verified address: [facility] [address] [place] [address]   Medication will be filled on 05/26/23.

## 2023-05-22 ENCOUNTER — Other Ambulatory Visit: Payer: Self-pay

## 2023-05-26 ENCOUNTER — Other Ambulatory Visit: Payer: Self-pay

## 2023-05-27 ENCOUNTER — Telehealth: Payer: Self-pay

## 2023-05-27 ENCOUNTER — Other Ambulatory Visit (HOSPITAL_COMMUNITY): Payer: Self-pay

## 2023-05-27 NOTE — Telephone Encounter (Signed)
[facility] Patient Advocate Encounter  Patient's medications CABENUVA have been couriered to [facility] from [facility] Special[facility] pharmacy and will be administered at the patients appointment on 06/11/23.  Kae Heller, CPhT Specialty Pharmacy Patient [facility] for Infectious Disease Phone: [phone] Fax:  [phone]

## 2023-06-04 NOTE — Progress Notes (Signed)
HPI: Micheal Leonard is a 27 y.o. male who presents to the [facility] pharmacy clinic for [place] administration.  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   AMLODIPINE (NORVASC) 5 MG TABLET    Take 1 tablet (5 mg total) by mouth daily.   CABOTEGRAVIR & RILPIVIRINE ER (CABENUVA) 600 & 900 MG/3ML INJECTION    Inject 1 kit into the muscle every 2 (two) months.   DOXYCYCLINE (VIBRA-TABS) 100 MG TABLET    Take 1 tablet by mouth 2 (two) times daily.   EMPAGLIFLOZIN (JARDIANCE) 10 MG TABS TABLET    Take 1 tablet (10 mg total) by mouth daily before breakfast.   FEXOFENADINE (ALLEGRA) 180 MG TABLET    Take 180 mg by mouth daily.  Modified Medications   No medications on file  Discontinued Medications   No medications on file    Allergies: Allergies  Allergen Reactions   Other     An HIV drug and can not remember  exactly.    Labs: Lab Results  Component Value Date   HIV1RNAQUANT Not Detected 02/12/2023   HIV1RNAQUANT Not Detected 12/04/2022   HIV1RNAQUANT Not Detected 08/07/2022   CD4TABS 384 (L) 02/12/2023   CD4TABS 267 (L) 05/08/2022   CD4TABS 288 (L) 03/06/2022    RPR and STI Lab Results  Component Value Date   LABRPR  REACTIVE (A) 02/12/2023   LABRPR REACTIVE (A) 12/04/2022   LABRPR REACTIVE (A) 04/10/2022   LABRPR REACTIVE (A) 01/09/2022   LABRPR REACTIVE (A) 10/31/2021   RPRTITER 1:2 (H) 02/12/2023   RPRTITER 1:4 (H) 12/04/2022   RPRTITER 1:4 (H) 04/10/2022   RPRTITER 1:4 (H) 01/09/2022   RPRTITER 1:4 (H) 10/31/2021    STI Results GC CT  02/12/2023  2:08 PM Negative    Negative    Negative  Negative    Negative    Negative   12/04/2022  2:02 PM Negative    Negative    Negative  Negative    Negative    Negative   01/09/2022  9:40 AM Negative    Negative    Negative  Negative    Negative    Negative   04/30/2021 11:11 AM Negative    Negative    Negative  Negative    Positive    Negative   11/24/2020 10:57 AM Negative    Negative    Negative  Negative    Negative    Negative   08/24/2020  8:42 AM Negative    Negative    Negative  Negative    Negative    Negative   06/07/2020 11:16 AM Negative    Negative    Negative  Negative    Negative    Negative  11/02/2019  1:46 PM Negative  Negative   06/16/2019  1:38 PM Negative    Positive    Negative  Negative    Positive    Negative   12/24/2018 12:00 AM Negative    Negative    Negative  Negative    Negative    Negative   07/17/2018 12:00 AM **POSITIVE**    Negative    Negative  **POSITIVE**    Negative    Negative   04/30/2018 12:00 AM Negative  Negative   01/23/2018 12:00 AM Negative    Negative  Negative    Negative   11/27/2017 12:00 AM Negative    Negative    Negative  Negative    Negative    Negative   09/07/2017 12:00 AM **POSITIVE**  Negative   05/05/2017 12:00 AM Negative  **POSITIVE**   01/22/2017 12:00 AM Negative    Negative    Negative  Negative    Negative    Negative   10/25/2016 12:00 AM Negative  **POSITIVE**   10/24/2016 12:00 AM Negative    Negative  Negative    Negative     Hepatitis B Lab Results  Component Value Date   HEPBSAB REACTIVE (A) 12/05/2021   HEPBSAG NON-REACTIVE  01/25/2020   HEPBCAB NON-REACTIVE 01/25/2020   Hepatitis C Lab Results  Component Value Date   HEPCAB NON-REACTIVE 07/02/2019   Hepatitis A Lab Results  Component Value Date   HAV Positive (A) 10/25/2016   Lipids: Lab Results  Component Value Date   CHOL 179 12/05/2021   TRIG 98 12/05/2021   HDL 49 12/05/2021   CHOLHDL 3.7 12/05/2021   VLDL 17 01/22/2017   LDLCALC 110 (H) 12/05/2021    TARGET DATE: The 23rd of the month  Assessment: Micheal Leonard presents today for his maintenance Cabenuva injections. Past injections were tolerated well without issues. Last provider visit with Dr. [name] on 02/12/23. Micheal Leonard was undetectable, with CD4 ct in the 300s at that time. Next visit with Dr. [name] on 08/05/23 with labs. Micheal Leonard declines STI testing today.  Immunizations: Micheal Leonard is eligible for PCV 20 if he would like. He will defer this for now.  Administered cabotegravir 600mg /91mL in left upper outer quadrant of the gluteal muscle. Administered rilpivirine 900 mg/29mL in the right upper outer quadrant of the gluteal muscle. No issues with injections. Micheal Leonard will follow up in 2 months for next set of injections.  Plan: - Cabenuva injections administered - Next injections scheduled for 08/05/23 with Dr. [name], and 10/07/23 with Micheal Leonard - Call with any issues or questions  Lora Paula, PharmD PGY-2 Infectious Diseases Pharmacy Resident [facility] 06/05/2023 5:13 PM

## 2023-06-05 ENCOUNTER — Ambulatory Visit (INDEPENDENT_AMBULATORY_CARE_PROVIDER_SITE_OTHER): Payer: Medicaid Other | Admitting: Pharmacist

## 2023-06-05 ENCOUNTER — Other Ambulatory Visit: Payer: Self-pay

## 2023-06-05 DIAGNOSIS — B20 Human immunodeficiency virus [HIV] disease: Secondary | ICD-10-CM

## 2023-06-05 MED ORDER — CABOTEGRAVIR & RILPIVIRINE ER 600 & 900 MG/3ML IM SUER
1.0000 | Freq: Once | INTRAMUSCULAR | Status: AC
  Administered 2023-06-05: 1 via INTRAMUSCULAR

## 2023-06-06 NOTE — Addendum Note (Signed)
Addended by: [name] Haines on: 06/06/2023 04:34 PM   Modules accepted: Level of Service

## 2023-06-11 ENCOUNTER — Encounter: Payer: Medicaid Other | Admitting: Pharmacist

## 2023-07-15 ENCOUNTER — Other Ambulatory Visit (HOSPITAL_COMMUNITY): Payer: Self-pay

## 2023-07-18 ENCOUNTER — Other Ambulatory Visit (HOSPITAL_COMMUNITY): Payer: Self-pay

## 2023-07-18 ENCOUNTER — Other Ambulatory Visit: Payer: Self-pay

## 2023-07-18 NOTE — Progress Notes (Signed)
 Specialty Pharmacy Refill Coordination Note  Micheal Leonard is a 28 y.o. male assessed today regarding refills of clinic administered specialty medication(s) Cabotegravir  & Rilpivirine  (CABENUVA )   Clinic requested Courier to Provider Office   Delivery date: 07/30/23   Verified address: [address] [name] [address]   Medication will be filled on 07/29/23.

## 2023-07-24 DIAGNOSIS — I10 Essential (primary) hypertension: Secondary | ICD-10-CM | POA: Diagnosis not present

## 2023-07-24 DIAGNOSIS — Z9889 Other specified postprocedural states: Secondary | ICD-10-CM | POA: Diagnosis not present

## 2023-07-24 DIAGNOSIS — Q224 Congenital tricuspid stenosis: Secondary | ICD-10-CM | POA: Diagnosis not present

## 2023-07-24 DIAGNOSIS — I519 Heart disease, unspecified: Secondary | ICD-10-CM | POA: Diagnosis not present

## 2023-07-30 ENCOUNTER — Telehealth: Payer: Self-pay

## 2023-07-30 NOTE — Telephone Encounter (Signed)
 [facility] Patient Advocate Encounter  Patient's medications CABENUVA have been couriered to [facility] from [facility] Specialty pharmacy and will be administered at the patients appointment on 08/05/23.  Kae Heller, CPhT Specialty Pharmacy Patient [facility] for Infectious Disease Phone: [phone] Fax:  [phone]

## 2023-08-05 ENCOUNTER — Ambulatory Visit: Payer: Medicaid Other | Admitting: Infectious Disease

## 2023-08-06 ENCOUNTER — Ambulatory Visit (INDEPENDENT_AMBULATORY_CARE_PROVIDER_SITE_OTHER): Payer: Medicaid Other | Admitting: Pharmacist

## 2023-08-06 ENCOUNTER — Other Ambulatory Visit: Payer: Self-pay

## 2023-08-06 DIAGNOSIS — B20 Human immunodeficiency virus [HIV] disease: Secondary | ICD-10-CM

## 2023-08-06 MED ORDER — CABOTEGRAVIR & RILPIVIRINE ER 600 & 900 MG/3ML IM SUER
1.0000 | Freq: Once | INTRAMUSCULAR | Status: AC
  Administered 2023-08-06: 1 via INTRAMUSCULAR

## 2023-08-06 NOTE — Progress Notes (Signed)
HPI: Micheal Leonard is a 28 y.o. male who presents to the [facility] pharmacy clinic for [place] administration.  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   AMLODIPINE (NORVASC) 5 MG TABLET    Take 1 tablet (5 mg total) by mouth daily.   CABOTEGRAVIR & RILPIVIRINE ER (CABENUVA) 600 & 900 MG/3ML INJECTION    Inject 1 kit into the muscle every 2 (two) months.   DOXYCYCLINE (VIBRA-TABS) 100 MG TABLET    Take 1 tablet by mouth 2 (two) times daily.   EMPAGLIFLOZIN (JARDIANCE) 10 MG TABS TABLET    Take 1 tablet (10 mg total) by mouth daily before breakfast.   FEXOFENADINE (ALLEGRA) 180 MG TABLET    Take 180 mg by mouth daily.  Modified Medications   No medications on file  Discontinued Medications   No medications on file    Allergies: Allergies  Allergen Reactions   Other     An HIV drug and can not remember  exactly.    Labs: Lab Results  Component Value Date   HIV1RNAQUANT Not Detected 02/12/2023   HIV1RNAQUANT Not Detected 12/04/2022   HIV1RNAQUANT Not Detected 08/07/2022   CD4TABS 384 (L) 02/12/2023   CD4TABS 267 (L) 05/08/2022   CD4TABS 288 (L) 03/06/2022    RPR and STI Lab Results  Component Value Date   LABRPR  REACTIVE (A) 02/12/2023   LABRPR REACTIVE (A) 12/04/2022   LABRPR REACTIVE (A) 04/10/2022   LABRPR REACTIVE (A) 01/09/2022   LABRPR REACTIVE (A) 10/31/2021   RPRTITER 1:2 (H) 02/12/2023   RPRTITER 1:4 (H) 12/04/2022   RPRTITER 1:4 (H) 04/10/2022   RPRTITER 1:4 (H) 01/09/2022   RPRTITER 1:4 (H) 10/31/2021    STI Results GC CT  02/12/2023  2:08 PM Negative    Negative    Negative  Negative    Negative    Negative   12/04/2022  2:02 PM Negative    Negative    Negative  Negative    Negative    Negative   01/09/2022  9:40 AM Negative    Negative    Negative  Negative    Negative    Negative   04/30/2021 11:11 AM Negative    Negative    Negative  Negative    Positive    Negative   11/24/2020 10:57 AM Negative    Negative    Negative  Negative    Negative    Negative   08/24/2020  8:42 AM Negative    Negative    Negative  Negative    Negative    Negative   06/07/2020 11:16 AM Negative    Negative    Negative  Negative    Negative    Negative  11/02/2019  1:46 PM Negative  Negative   06/16/2019  1:38 PM Negative    Positive    Negative  Negative    Positive    Negative   12/24/2018 12:00 AM Negative    Negative    Negative  Negative    Negative    Negative   07/17/2018 12:00 AM **POSITIVE**    Negative    Negative  **POSITIVE**    Negative    Negative   04/30/2018 12:00 AM Negative  Negative   01/23/2018 12:00 AM Negative    Negative  Negative    Negative   11/27/2017 12:00 AM Negative    Negative    Negative  Negative    Negative    Negative   09/07/2017 12:00 AM **POSITIVE**  Negative   05/05/2017 12:00 AM Negative  **POSITIVE**   01/22/2017 12:00 AM Negative    Negative    Negative  Negative    Negative    Negative   10/25/2016 12:00 AM Negative  **POSITIVE**   10/24/2016 12:00 AM Negative    Negative  Negative    Negative     Hepatitis B Lab Results  Component Value Date   HEPBSAB REACTIVE (A) 12/05/2021   HEPBSAG NON-REACTIVE  01/25/2020   HEPBCAB NON-REACTIVE 01/25/2020   Hepatitis C Lab Results  Component Value Date   HEPCAB NON-REACTIVE 07/02/2019   Hepatitis A Lab Results  Component Value Date   HAV Positive (A) 10/25/2016   Lipids: Lab Results  Component Value Date   CHOL 179 12/05/2021   TRIG 98 12/05/2021   HDL 49 12/05/2021   CHOLHDL 3.7 12/05/2021   VLDL 17 01/22/2017   LDLCALC 110 (H) 12/05/2021    TARGET DATE: 27th day of the month   Assessment: Micheal Leonard presents today for their maintenance Cabenuva injections. Past injections were tolerated well without issues. Last HIV RNA was undetectable and CD4 384 from 02/12/2023. Will do HIV RNA and CD4 today since it has been 6 months from most recent HIV RNA and CD4 counts. Patient denies signs and symptoms of STI. Declined full STI testing today with RPR and oral/rectal/urine cytologies.   Pertinent Labs: BMP from 04/2023 normal except low CO2 (18). LFTs from 03/2022 within normal limits. CBC from 04/2023 normal as well. Most recent lipid panel from 12/05/21 normal except LDL 110. Will get updated lipid panel today.   Vaccination: due for meningococcal and PCV20 vaccines. Patient deferred both vaccines to next follow up visit.   Social Worker, Wyatt Portela, has also requested to see Micheal Leonard today. Informed the patient to stop by [facility] office before leaving [facility] today.   Administered cabotegravir 600mg /15mL in left upper outer quadrant of the gluteal muscle. Administered rilpivirine 900 mg/19mL in the right upper outer quadrant of the gluteal muscle. No issues with injections. They will follow up in 2 months for next set of injections.  Plan: - Cabenuva injections administered - HIV RNA, CD4, Lipid Panel today - PCV20 and meningococcal vaccines on next follow up visit - Next injections scheduled for 10/08/23 with Dr. [name] and on 12/05/23 with Marchelle Folks - Call with any issues or questions  Dimple Casey. Edwena Blow, PharmD Candidate [facility]  [employer]

## 2023-08-07 LAB — T-HELPER CELLS (CD4) COUNT (NOT AT ARMC)
CD4 % Helper T Cell: 25 % — ABNORMAL LOW (ref 33–65)
CD4 T Cell Abs: 330 /uL — ABNORMAL LOW (ref 400–1790)

## 2023-08-11 LAB — LIPID PANEL
Cholesterol: 190 mg/dL (ref <200)
HDL: 42 mg/dL (ref >=40)
LDL Cholesterol (Calc): 131 mg/dL — ABNORMAL HIGH
Non-HDL Cholesterol (Calc): 148 mg/dL — ABNORMAL HIGH (ref <130)
Total CHOL/HDL Ratio: 4.5 (calc) (ref <5.0)
Triglycerides: 73 mg/dL (ref <150)

## 2023-08-11 LAB — HIV-1 RNA QUANT-NO REFLEX-BLD
HIV 1 RNA Quant: NOT DETECTED {copies}/mL
HIV-1 RNA Quant, Log: NOT DETECTED {Log}

## 2023-08-22 DIAGNOSIS — Z9889 Other specified postprocedural states: Secondary | ICD-10-CM | POA: Diagnosis not present

## 2023-08-22 DIAGNOSIS — Q224 Congenital tricuspid stenosis: Secondary | ICD-10-CM | POA: Diagnosis not present

## 2023-08-22 DIAGNOSIS — I519 Heart disease, unspecified: Secondary | ICD-10-CM | POA: Diagnosis not present

## 2023-08-22 DIAGNOSIS — I420 Dilated cardiomyopathy: Secondary | ICD-10-CM | POA: Diagnosis not present

## 2023-08-22 DIAGNOSIS — I10 Essential (primary) hypertension: Secondary | ICD-10-CM | POA: Diagnosis not present

## 2023-09-16 ENCOUNTER — Other Ambulatory Visit: Payer: Self-pay

## 2023-09-16 ENCOUNTER — Other Ambulatory Visit (HOSPITAL_COMMUNITY): Payer: Self-pay

## 2023-09-16 NOTE — Progress Notes (Signed)
 Specialty Pharmacy Refill Coordination Note  Micheal Leonard is a 28 y.o. male assessed today regarding refills of clinic administered specialty medication(s) Cabotegravir & Rilpivirine [facility])   Clinic requested Courier to Provider Office   Delivery date: 10/03/23   Verified address: [address] [place] [address]   Medication will be filled on 10/02/23.

## 2023-09-26 DIAGNOSIS — Q224 Congenital tricuspid stenosis: Secondary | ICD-10-CM | POA: Diagnosis not present

## 2023-09-26 DIAGNOSIS — Z79899 Other long term (current) drug therapy: Secondary | ICD-10-CM | POA: Diagnosis not present

## 2023-09-26 DIAGNOSIS — Z9889 Other specified postprocedural states: Secondary | ICD-10-CM | POA: Diagnosis not present

## 2023-09-26 DIAGNOSIS — B199 Unspecified viral hepatitis without hepatic coma: Secondary | ICD-10-CM | POA: Diagnosis not present

## 2023-09-26 DIAGNOSIS — T8189XA Other complications of procedures, not elsewhere classified, initial encounter: Secondary | ICD-10-CM | POA: Diagnosis not present

## 2023-09-26 DIAGNOSIS — Z5181 Encounter for therapeutic drug level monitoring: Secondary | ICD-10-CM | POA: Diagnosis not present

## 2023-09-26 DIAGNOSIS — Z21 Asymptomatic human immunodeficiency virus [HIV] infection status: Secondary | ICD-10-CM | POA: Diagnosis not present

## 2023-09-26 DIAGNOSIS — Q210 Ventricular septal defect: Secondary | ICD-10-CM | POA: Diagnosis not present

## 2023-09-26 DIAGNOSIS — K769 Liver disease, unspecified: Secondary | ICD-10-CM | POA: Diagnosis not present

## 2023-09-26 DIAGNOSIS — T8189XD Other complications of procedures, not elsewhere classified, subsequent encounter: Secondary | ICD-10-CM | POA: Diagnosis not present

## 2023-10-02 ENCOUNTER — Other Ambulatory Visit: Payer: Self-pay

## 2023-10-03 ENCOUNTER — Telehealth: Payer: Self-pay

## 2023-10-03 NOTE — Telephone Encounter (Signed)
 [facility] Patient Advocate Encounter  Patient's medications [name] have been couriered to [facility] from [facility] and will be administered at the patients appointment on 10/08/23.  Clearance Coots, CPhT Specialty Pharmacy Patient [facility] for Infectious Disease Phone: [phone] Fax:  [phone]

## 2023-10-03 NOTE — Progress Notes (Signed)
 HPI: Micheal Leonard is a 28 y.o. male who presents to the [facility] pharmacy clinic for [place] administration.  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   AMLODIPINE (NORVASC) 5 MG TABLET    Take 1 tablet (5 mg total) by mouth daily.   CABOTEGRAVIR & RILPIVIRINE ER (CABENUVA) 600 & 900 MG/3ML INJECTION    Inject 1 kit into the muscle every 2 (two) months.   DOXYCYCLINE (VIBRA-TABS) 100 MG TABLET    Take 1 tablet by mouth 2 (two) times daily.   EMPAGLIFLOZIN (JARDIANCE) 10 MG TABS TABLET    Take 1 tablet (10 mg total) by mouth daily before breakfast.   FEXOFENADINE (ALLEGRA) 180 MG TABLET    Take 180 mg by mouth daily.  Modified Medications   No medications on file  Discontinued Medications   No medications on file    Allergies: Allergies  Allergen Reactions   Other     An HIV drug and can not remember  exactly.    Past Medical History: Past Medical History:  Diagnosis Date   Cardiac anomaly, congenital    Diarrhea 01/22/2017   Dizziness 05/30/2020   EBV infection    hx/notes 10/23/2016   Erectile dysfunction 05/30/2020   Gonorrhea 07/20/2019   Hemoptysis 10/23/2016   [name] 10/23/2016   HIV (human immunodeficiency virus  infection) (HCC)    Hypertension    Hypertension 04/30/2021   Migraine    "a few/year now; frequency is less than in the past" (10/23/2016)   Nonadherence to medical treatment 09/08/2019   Pharyngitis 10/23/2016   notes 10/23/2016   Pulmonary embolism (HCC) 01/29/2016   S/P bidirectional Glenn shunt    hx/notes 10/23/2016   S/P Fontan procedure    hx/notes 10/23/2016   Syphilis 07/20/2019   Tricuspid atresia and stenosis, congenital     Social History: Social History   Socioeconomic History   Marital status: Significant Other    Spouse name: Not on file   Number of children: Not on file   Years of education: Not on file   Highest education level: Not on file  Occupational History   Not on file  Tobacco Use   Smoking status: Never   Smokeless tobacco: Never  Vaping Use   Vaping status: Never Used  Substance and Sexual Activity   Alcohol use: Not Currently    Comment: 2 GLASSES A WEEK   Drug use: Not Currently    Frequency: 2.0 times per week    Types: Marijuana   Sexual activity: Yes    Partners: Male    Birth control/protection: Condom    Comment: declined condoms  Other Topics Concern   Not on file  Social History Narrative   Not on file  Social Drivers of Corporate investment banker Strain: Not on file  Food Insecurity: Not on file  Transportation Needs: Not on file  Physical Activity: Not on file  Stress: Not on file  Social Connections: Not on file    Labs: Lab Results  Component Value Date   HIV1RNAQUANT Not Detected 08/06/2023   HIV1RNAQUANT Not Detected 02/12/2023   HIV1RNAQUANT Not Detected 12/04/2022   CD4TABS 330 (L) 08/06/2023   CD4TABS 384 (L) 02/12/2023   CD4TABS 267 (L) 05/08/2022    RPR and STI Lab Results  Component Value Date   LABRPR REACTIVE (A) 02/12/2023   LABRPR REACTIVE (A) 12/04/2022   LABRPR REACTIVE (A) 04/10/2022   LABRPR REACTIVE (A) 01/09/2022   LABRPR REACTIVE (A) 10/31/2021   RPRTITER 1:2 (H) 02/12/2023   RPRTITER  1:4 (H) 12/04/2022   RPRTITER 1:4 (H) 04/10/2022   RPRTITER 1:4 (H) 01/09/2022   RPRTITER 1:4 (H) 10/31/2021    STI Results GC CT  02/12/2023  2:08 PM Negative    Negative    Negative  Negative    Negative    Negative   12/04/2022  2:02 PM Negative    Negative    Negative  Negative    Negative    Negative   01/09/2022  9:40 AM Negative    Negative    Negative  Negative    Negative    Negative   04/30/2021 11:11 AM Negative    Negative    Negative  Negative    Positive    Negative   11/24/2020 10:57 AM Negative    Negative    Negative  Negative    Negative    Negative   08/24/2020  8:42 AM Negative    Negative    Negative  Negative    Negative    Negative   06/07/2020 11:16 AM Negative    Negative    Negative  Negative    Negative    Negative   11/02/2019  1:46 PM Negative  Negative   06/16/2019  1:38 PM Negative    Positive    Negative  Negative    Positive    Negative   12/24/2018 12:00 AM Negative    Negative    Negative  Negative    Negative    Negative   07/17/2018 12:00 AM **POSITIVE**    Negative    Negative  **POSITIVE**    Negative    Negative   04/30/2018 12:00 AM Negative  Negative   01/23/2018 12:00 AM Negative    Negative  Negative    Negative   11/27/2017 12:00 AM Negative    Negative    Negative  Negative    Negative    Negative   09/07/2017 12:00 AM **POSITIVE**  Negative   05/05/2017 12:00 AM Negative  **POSITIVE**   01/22/2017 12:00 AM Negative    Negative    Negative  Negative    Negative    Negative   10/25/2016 12:00 AM Negative  **POSITIVE**   10/24/2016 12:00 AM Negative    Negative  Negative    Negative     Hepatitis B Lab Results  Component Value Date   HEPBSAB REACTIVE (A) 12/05/2021   HEPBSAG NON-REACTIVE 01/25/2020   HEPBCAB NON-REACTIVE 01/25/2020   Hepatitis C Lab Results  Component Value Date   HEPCAB NON-REACTIVE 07/02/2019   Hepatitis A Lab Results  Component Value Date   HAV Positive (A)  10/25/2016   Lipids: Lab Results  Component Value  Date   CHOL 190 08/06/2023   TRIG 73 08/06/2023   HDL 42 08/06/2023   CHOLHDL 4.5 08/06/2023   VLDL 17 01/22/2017   LDLCALC 131 (H) 08/06/2023    TARGET DATE:  The 27th of the month  Assessment: Ashaad presents today for their maintenance Cabenuva injections. Initial/past injections were tolerated well without issues. No problems with systemic effects of injections.   Administered cabotegravir 600mg /86mL in left upper outer quadrant of the gluteal muscle. Administered rilpivirine 900 mg/12mL in the right upper outer quadrant of the gluteal muscle. Monitored patient for 10 minutes after injection. Injections were tolerated well without issue. Patient will follow up in 2 months for next injection. Will defer HIV RNA as it was recently assessed in January.  Eligible for [place] and PCV20 vaccines; deferred injections today. Politely declines STI testing as well.   Plan: - Cabenuva injections administered - Next injections scheduled for 5/21 with Dr. [name] and 7/23 with Cassie - Call with any issues or questions  Margarite Gouge, PharmD, CPP, BCIDP, AAHIVP Clinical Pharmacist Practitioner Infectious Diseases Clinical Pharmacist [facility]

## 2023-10-07 ENCOUNTER — Encounter: Payer: Medicaid Other | Admitting: Pharmacist

## 2023-10-08 ENCOUNTER — Encounter: Payer: Medicaid Other | Admitting: Infectious Disease

## 2023-10-08 ENCOUNTER — Other Ambulatory Visit: Payer: Self-pay

## 2023-10-08 ENCOUNTER — Ambulatory Visit: Admitting: Pharmacist

## 2023-10-08 DIAGNOSIS — B20 Human immunodeficiency virus [HIV] disease: Secondary | ICD-10-CM | POA: Diagnosis present

## 2023-10-08 MED ORDER — CABOTEGRAVIR & RILPIVIRINE ER 600 & 900 MG/3ML IM SUER
1.0000 | Freq: Once | INTRAMUSCULAR | Status: AC
  Administered 2023-10-08: 1 via INTRAMUSCULAR

## 2023-10-15 ENCOUNTER — Emergency Department (HOSPITAL_COMMUNITY)

## 2023-10-15 ENCOUNTER — Inpatient Hospital Stay (HOSPITAL_COMMUNITY)
Admission: EM | Admit: 2023-10-15 | Discharge: 2023-10-19 | DRG: 291 | Disposition: A | Discharge status: Home / Self Care | Attending: Internal Medicine | Admitting: Internal Medicine

## 2023-10-15 ENCOUNTER — Other Ambulatory Visit: Payer: Self-pay

## 2023-10-15 DIAGNOSIS — Z86711 Personal history of pulmonary embolism: Secondary | ICD-10-CM

## 2023-10-15 DIAGNOSIS — I517 Cardiomegaly: Secondary | ICD-10-CM | POA: Diagnosis not present

## 2023-10-15 DIAGNOSIS — R059 Cough, unspecified: Secondary | ICD-10-CM | POA: Diagnosis not present

## 2023-10-15 DIAGNOSIS — Z8774 Personal history of (corrected) congenital malformations of heart and circulatory system: Secondary | ICD-10-CM

## 2023-10-15 DIAGNOSIS — J301 Allergic rhinitis due to pollen: Secondary | ICD-10-CM | POA: Diagnosis not present

## 2023-10-15 DIAGNOSIS — I420 Dilated cardiomyopathy: Secondary | ICD-10-CM | POA: Diagnosis not present

## 2023-10-15 DIAGNOSIS — J9601 Acute respiratory failure with hypoxia: Secondary | ICD-10-CM | POA: Diagnosis not present

## 2023-10-15 DIAGNOSIS — Z1152 Encounter for screening for COVID-19: Secondary | ICD-10-CM

## 2023-10-15 DIAGNOSIS — E860 Dehydration: Secondary | ICD-10-CM | POA: Diagnosis not present

## 2023-10-15 DIAGNOSIS — Z21 Asymptomatic human immunodeficiency virus [HIV] infection status: Secondary | ICD-10-CM | POA: Diagnosis not present

## 2023-10-15 DIAGNOSIS — B20 Human immunodeficiency virus [HIV] disease: Secondary | ICD-10-CM | POA: Diagnosis not present

## 2023-10-15 DIAGNOSIS — I2699 Other pulmonary embolism without acute cor pulmonale: Secondary | ICD-10-CM | POA: Diagnosis present

## 2023-10-15 DIAGNOSIS — J309 Allergic rhinitis, unspecified: Secondary | ICD-10-CM | POA: Diagnosis present

## 2023-10-15 DIAGNOSIS — Q210 Ventricular septal defect: Secondary | ICD-10-CM | POA: Diagnosis not present

## 2023-10-15 DIAGNOSIS — Q224 Congenital tricuspid stenosis: Secondary | ICD-10-CM | POA: Diagnosis not present

## 2023-10-15 DIAGNOSIS — Z7984 Long term (current) use of oral hypoglycemic drugs: Secondary | ICD-10-CM

## 2023-10-15 DIAGNOSIS — Z79899 Other long term (current) drug therapy: Secondary | ICD-10-CM

## 2023-10-15 DIAGNOSIS — R7989 Other specified abnormal findings of blood chemistry: Secondary | ICD-10-CM | POA: Diagnosis not present

## 2023-10-15 DIAGNOSIS — I340 Nonrheumatic mitral (valve) insufficiency: Secondary | ICD-10-CM | POA: Diagnosis not present

## 2023-10-15 DIAGNOSIS — J209 Acute bronchitis, unspecified: Secondary | ICD-10-CM | POA: Diagnosis not present

## 2023-10-15 DIAGNOSIS — Q211 Atrial septal defect, unspecified: Secondary | ICD-10-CM

## 2023-10-15 DIAGNOSIS — I110 Hypertensive heart disease with heart failure: Principal | ICD-10-CM | POA: Diagnosis present

## 2023-10-15 DIAGNOSIS — I10 Essential (primary) hypertension: Secondary | ICD-10-CM | POA: Diagnosis not present

## 2023-10-15 DIAGNOSIS — R0602 Shortness of breath: Secondary | ICD-10-CM | POA: Diagnosis present

## 2023-10-15 DIAGNOSIS — R0989 Other specified symptoms and signs involving the circulatory and respiratory systems: Secondary | ICD-10-CM | POA: Diagnosis not present

## 2023-10-15 DIAGNOSIS — I5023 Acute on chronic systolic (congestive) heart failure: Secondary | ICD-10-CM | POA: Diagnosis not present

## 2023-10-15 DIAGNOSIS — I429 Cardiomyopathy, unspecified: Secondary | ICD-10-CM | POA: Diagnosis not present

## 2023-10-15 DIAGNOSIS — R0902 Hypoxemia: Secondary | ICD-10-CM | POA: Diagnosis not present

## 2023-10-15 DIAGNOSIS — M79606 Pain in leg, unspecified: Secondary | ICD-10-CM | POA: Diagnosis not present

## 2023-10-15 DIAGNOSIS — Z8249 Family history of ischemic heart disease and other diseases of the circulatory system: Secondary | ICD-10-CM

## 2023-10-15 DIAGNOSIS — I5022 Chronic systolic (congestive) heart failure: Secondary | ICD-10-CM | POA: Diagnosis not present

## 2023-10-15 DIAGNOSIS — R000 Tachycardia, unspecified: Secondary | ICD-10-CM | POA: Diagnosis present

## 2023-10-15 LAB — URINALYSIS, W/ REFLEX TO CULTURE (INFECTION SUSPECTED)
Bilirubin Urine: NEGATIVE
Glucose, UA: NEGATIVE mg/dL
Hgb urine dipstick: NEGATIVE
Ketones, ur: NEGATIVE mg/dL
Leukocytes,Ua: NEGATIVE
Nitrite: NEGATIVE
Protein, ur: 100 mg/dL — AB
Specific Gravity, Urine: 1.013 (ref 1.005–1.030)
pH: 7 (ref 5.0–8.0)

## 2023-10-15 LAB — COMPREHENSIVE METABOLIC PANEL WITH GFR
ALT: 34 U/L (ref 0–44)
AST: 45 U/L — ABNORMAL HIGH (ref 15–41)
Albumin: 3.7 g/dL (ref 3.5–5.0)
Alkaline Phosphatase: 89 U/L (ref 38–126)
Anion gap: 10 (ref 5–15)
BUN: 10 mg/dL (ref 6–20)
CO2: 18 mmol/L — ABNORMAL LOW (ref 22–32)
Calcium: 8.8 mg/dL — ABNORMAL LOW (ref 8.9–10.3)
Chloride: 110 mmol/L (ref 98–111)
Creatinine, Ser: 1.29 mg/dL — ABNORMAL HIGH (ref 0.61–1.24)
GFR, Estimated: >60 mL/min (ref >60)
Glucose, Bld: 109 mg/dL — ABNORMAL HIGH (ref 70–99)
Potassium: 3.6 mmol/L (ref 3.5–5.1)
Sodium: 138 mmol/L (ref 135–145)
Total Bilirubin: 1.2 mg/dL (ref 0.0–1.2)
Total Protein: 7.4 g/dL (ref 6.5–8.1)

## 2023-10-15 LAB — RESP PANEL BY RT-PCR (RSV, FLU A&B, COVID)  RVPGX2
Influenza A by PCR: NEGATIVE
Influenza B by PCR: NEGATIVE
Resp Syncytial Virus by PCR: NEGATIVE
SARS Coronavirus 2 by RT PCR: NEGATIVE

## 2023-10-15 LAB — CBC WITH DIFFERENTIAL/PLATELET
Abs Immature Granulocytes: 0.02 10*3/uL (ref 0.00–0.07)
Basophils Absolute: 0.1 10*3/uL (ref 0.0–0.1)
Basophils Relative: 1 %
Eosinophils Absolute: 0.1 10*3/uL (ref 0.0–0.5)
Eosinophils Relative: 2 %
HCT: 49.7 % (ref 39.0–52.0)
Hemoglobin: 16.4 g/dL (ref 13.0–17.0)
Immature Granulocytes: 0 %
Lymphocytes Relative: 23 %
Lymphs Abs: 1.9 10*3/uL (ref 0.7–4.0)
MCH: 29 pg (ref 26.0–34.0)
MCHC: 33 g/dL (ref 30.0–36.0)
MCV: 88 fL (ref 80.0–100.0)
Monocytes Absolute: 0.7 10*3/uL (ref 0.1–1.0)
Monocytes Relative: 8 %
Neutro Abs: 5.5 10*3/uL (ref 1.7–7.7)
Neutrophils Relative %: 66 %
Platelets: 255 10*3/uL (ref 150–400)
RBC: 5.65 MIL/uL (ref 4.22–5.81)
RDW: 14.1 % (ref 11.5–15.5)
WBC: 8.3 10*3/uL (ref 4.0–10.5)
nRBC: 0 % (ref 0.0–0.2)

## 2023-10-15 LAB — PROTIME-INR
INR: 1.2 (ref 0.8–1.2)
Prothrombin Time: 15.4 s — ABNORMAL HIGH (ref 11.4–15.2)

## 2023-10-15 LAB — I-STAT CG4 LACTIC ACID, ED: Lactic Acid, Venous: 1 mmol/L (ref 0.5–1.9)

## 2023-10-15 LAB — BRAIN NATRIURETIC PEPTIDE: B Natriuretic Peptide: 361.3 pg/mL — ABNORMAL HIGH (ref 0.0–100.0)

## 2023-10-15 LAB — TROPONIN I (HIGH SENSITIVITY): Troponin I (High Sensitivity): 40 ng/L — ABNORMAL HIGH (ref <18)

## 2023-10-15 LAB — CK: Total CK: 598 U/L — ABNORMAL HIGH (ref 49–397)

## 2023-10-15 LAB — APTT: aPTT: 27 s (ref 24–36)

## 2023-10-15 MED ORDER — ONDANSETRON HCL 4 MG/2ML IJ SOLN: 4.0000 mg | Freq: Four times a day (QID) | INTRAMUSCULAR | Status: DC | PRN

## 2023-10-15 MED ORDER — LEVALBUTEROL HCL 1.25 MG/0.5ML IN NEBU: 1.2500 mg | INHALATION_SOLUTION | RESPIRATORY_TRACT | Status: DC | PRN

## 2023-10-15 MED ORDER — MELATONIN 3 MG PO TABS: 3.0000 mg | ORAL_TABLET | Freq: Every evening | ORAL | Status: DC | PRN

## 2023-10-15 MED ORDER — ACETAMINOPHEN 325 MG PO TABS
650.0000 mg | ORAL_TABLET | Freq: Four times a day (QID) | ORAL | Status: DC | PRN
  Administered 2023-10-16: 650 mg via ORAL
  Filled 2023-10-15: qty 2

## 2023-10-15 MED ORDER — IOHEXOL 350 MG/ML SOLN
75.0000 mL | Freq: Once | INTRAVENOUS | Status: AC | PRN
  Administered 2023-10-15: 75 mL via INTRAVENOUS

## 2023-10-15 MED ORDER — ACETAMINOPHEN 650 MG RE SUPP: 650.0000 mg | Freq: Four times a day (QID) | RECTAL | Status: DC | PRN

## 2023-10-15 MED ORDER — SODIUM CHLORIDE 0.9 % IV BOLUS
500.0000 mL | Freq: Once | INTRAVENOUS | Status: AC
  Administered 2023-10-16: 500 mL via INTRAVENOUS

## 2023-10-15 NOTE — Consult Note (Addendum)
 Cardiology Consultation   Patient ID: Micheal Leonard MRN: [id number]; DOB: 01/31/1996  Admit date: 10/15/2023 Date of Consult: 10/15/2023  PCP:  Oneita Hurt No   [facility] Providers Cardiologist:  None        Patient Profile:   Micheal Leonard is a 28 y.o. 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 Gwenlyn Fudge in the emergency department.  History of Present Illness:   Micheal Leonard presented with the chief complaint of right lower extremity pain.  Notes that he recently received his antiretroviral injection around the site of his pain.  He believes there is some swelling at the site.  He has associated headache.  No fevers or chills.  No chest pain, orthopnea, PND.  No swelling in either lower extremity.  Not taking a daily diuretic at home.  Subjectively feels dehydrated due to associated nausea with his symptoms.   Regarding his cardiac history, he follows with Dr. Delrae Sawyers at [facility].  He was most recently seen in Feb 2025.  At that time, they were focused on optimization of his GDMT for management of his cardiomyopathy and his uncontrolled blood pressure.    At that time, there was concern that he was not taking his medications due to recently moving into a new apartment and misplacing medications.  He tells me today that he is not missing any doses of his medications, however he is not sure what his home medications are.  He does seem to recognize the names of his cardiac medications.    Past Medical History:  Diagnosis Date   Cardiac anomaly, congenital    Diarrhea 01/22/2017   Dizziness 05/30/2020   EBV infection    hx/notes 10/23/2016   Erectile dysfunction 05/30/2020   Gonorrhea 07/20/2019   Hemoptysis  10/23/2016   [name] 10/23/2016   HIV (human immunodeficiency virus infection) (HCC)    Hypertension    Hypertension 04/30/2021   Migraine    "a few/year now; frequency is less than in the past" (10/23/2016)   Nonadherence to medical treatment 09/08/2019   Pharyngitis 10/23/2016   notes 10/23/2016   Pulmonary embolism (HCC) 01/29/2016   S/P bidirectional Glenn shunt    hx/notes 10/23/2016   S/P Fontan procedure    hx/notes 10/23/2016   Syphilis 07/20/2019   Tricuspid atresia and stenosis, congenital     Past Surgical History:  Procedure Laterality Date   CARDIAC CATHETERIZATION  01/2016   at [facility]; hx/notes 10/23/2016   CARDIAC SURGERY  01/1997; 2000   fontane procedure at [facility] as a child      Home Medications:  Prior to Admission medications   Medication Sig Start Date End Date Taking? Authorizing Provider  amLODipine (NORVASC) 5 MG tablet Take 1 tablet (5 mg total) by mouth daily. Patient not taking: Reported on 02/12/2023 10/31/21   [name], Lisette Grinder, MD  amLODipine (NORVASC) 5 MG tablet Take 1 tablet (5 mg total) by mouth daily. 02/12/23   Randall Hiss, MD  cabotegravir & rilpivirine ER (CABENUVA) 600 & 900 MG/3ML injection Inject 1 kit into the muscle every 2 (two) months. 12/02/22   Jennette Kettle, RPH-CPP  empagliflozin (JARDIANCE) 10 MG TABS tablet Take 1 tablet (10 mg total) by mouth daily before breakfast. 02/12/23   [name],  Lisette Grinder, MD  fexofenadine (ALLEGRA) 180 MG tablet Take 180 mg by mouth daily. Patient not taking: Reported on 05/08/2022    [provider]    Inpatient Medications: Scheduled Meds:  Continuous Infusions:  sodium chloride     PRN Meds: acetaminophen **OR** acetaminophen, levalbuterol, melatonin, ondansetron (ZOFRAN) IV  Allergies:    Allergies  Allergen Reactions   Other     An HIV drug and can not remember  exactly.    Social History:   Social History   Socioeconomic History   Marital status: Significant Other     Spouse name: Not on file   Number of children: Not on file   Years of education: Not on file   Highest education level: Not on file  Occupational History   Not on file  Tobacco Use   Smoking status: Never   Smokeless tobacco: Never  Vaping Use   Vaping status: Never Used  Substance and Sexual Activity   Alcohol use: Not Currently    Comment: 2 GLASSES A WEEK   Drug use: Not Currently    Frequency: 2.0 times per week    Types: Marijuana   Sexual activity: Yes    Partners: Male    Birth control/protection: Condom    Comment: declined condoms  Other Topics Concern   Not on file  Social History Narrative   Not on file   Social Drivers of Health   Financial Resource Strain: Not on file  Food Insecurity: Not on file  Transportation Needs: Not on file  Physical Activity: Not on file  Stress: Not on file  Social Connections: Not on file  Intimate Partner Violence: Not on file    Family History:    Family History  Problem Relation Age of Onset   Hypertension Father      ROS:  Please see the history of present illness.   All other ROS reviewed and negative.     Physical Exam/Data:   Vitals:   10/15/23 1950 10/15/23 2056  BP: (!) 174/117   Pulse: (!) 124   Resp: (!) 22   Temp: 98.1 F (36.7 C)   TempSrc: Oral   SpO2: 90% (!) 87%   No intake or output data in the 24 hours ending 10/15/23 2345    02/12/2023    1:50 PM 05/08/2022    9:40 AM 01/09/2022    9:29 AM  Last 3 Weights  Weight (lbs) 215 lb 216 lb 211 lb  Weight (kg) 97.523 kg 97.977 kg 95.709 kg     There is no height or weight on file to calculate BMI.  General:  Well nourished, well developed, in no acute distress HEENT: normal Neck: no JVD or HJR Vascular: No carotid bruits; Distal pulses 2+ bilaterally Cardiac:  normal S1, S2; normal rate, tachycardic, harsh holosystolic murmur heard best at the left sternal border  Lungs:  clear to auscultation bilaterally, no wheezing, rhonchi or rales  Abd:  soft, nontender, no hepatomegaly  Ext: no edema Musculoskeletal:  No deformities, BUE and BLE strength normal and equal Skin: warm and dry  Neuro:  CNs 2-12 intact, no focal abnormalities noted Psych:  Normal affect   EKG:  The EKG was personally reviewed and demonstrates:  sinus tachycardia, LVH with strain Telemetry:  Telemetry was personally reviewed and demonstrates:  sinus tach  Relevant CV Studies: ECHO 04/03/23: The Anatomy is that of Tricuspid Atresia S/P Fontan  VEINS The IVC is normal size with no respiratory collapse The  SVC is well visualized with low velocity laminar flow, the [name] anastamosis is well visualized with low velocity laminar flow.  ATRIA The right atrium is normal in size. The left atrium is normal in size.  ATRIOVENTRICULAR VALVES The mitral valve is morphologicaly normal with mild-moderate regurgitation. Tricuspid atresia.  VENTRICLES There is single ventricle morphology with a systemic left ventricle. The LV function is severely decreased (LVEF 25%). 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 There is a large unrestricted VSD.  SEMILUNAR VALVES The aortic valve is trileaflet with trivial aortic regurgitation. The pulmonic valve is not visualized on this exam.  GREAT ARTERIES The ascending aorta is normal in size (3.0cm). The main pulmonary artery is not vizualized on this exam.   Laboratory Data:  High Sensitivity Troponin:   Recent Labs  Lab 10/15/23 2030  TROPONINIHS 40*     Chemistry Recent Labs  Lab 10/15/23 2040  NA 138  K 3.6  CL 110  CO2 18*  GLUCOSE 109*  BUN 10  CREATININE 1.29*  CALCIUM 8.8*  GFRNONAA >60  ANIONGAP 10    Recent Labs  Lab 10/15/23 2040  PROT 7.4  ALBUMIN 3.7  AST 45*  ALT 34  ALKPHOS 89  BILITOT 1.2   Lipids No results for input(s): "CHOL", "TRIG", "HDL", "LABVLDL", "LDLCALC", "CHOLHDL" in the last 168 hours.  Hematology Recent Labs  Lab 10/15/23 2040  WBC 8.3  RBC 5.65  HGB 16.4  HCT  49.7  MCV 88.0  MCH 29.0  MCHC 33.0  RDW 14.1  PLT 255   Thyroid No results for input(s): "TSH", "FREET4" in the last 168 hours.  BNP Recent Labs  Lab 10/15/23 2040  BNP 361.3*    DDimer No results for input(s): "DDIMER" in the last 168 hours.   Radiology/Studies:  CT Angio Chest PE W and/or Wo Contrast Result Date: 10/15/2023 CLINICAL DATA:  Pulmonary embolism (PE) suspected, high prob. intermittent right side body numbness onset approx. 6 am this morning with mild nausea, speech clear with no facial asymmetry , alert and oriented /ambulatory . History of tricuspid atresia and Fontan procedure. EXAM: CT ANGIOGRAPHY CHEST WITH CONTRAST TECHNIQUE: Multidetector CT imaging of the chest was performed using the standard protocol during bolus administration of intravenous contrast. Multiplanar CT image reconstructions and MIPs were obtained to evaluate the vascular anatomy. RADIATION DOSE REDUCTION: This exam was performed according to the departmental dose-optimization program which includes automated exposure control, adjustment of the mA and/or kV according to patient size and/or use of iterative reconstruction technique. CONTRAST:  75mL OMNIPAQUE IOHEXOL 350 MG/ML SOLN COMPARISON:  CT angio chest 10/23/2016 FINDINGS: Cardiovascular: Prior Fontan procedure surgical changes noted. Satisfactory opacification of the pulmonary arteries to the segmental level. Likely left lower lobe subsegmental pulmonary embolus (7:294). Enlarged right heart size consistent with history of tricuspid atresia. No pericardial effusion. Mediastinum/Nodes: No enlarged mediastinal, hilar, or axillary lymph nodes. Thyroid gland, trachea, and esophagus demonstrate no significant findings. Lungs/Pleura: No focal consolidation. Stable right upper lobe 5 x 4 mm pulmonary nodule. No pulmonary mass. No pleural effusion. No pneumothorax. Upper Abdomen: No acute abnormality. Musculoskeletal: No chest wall abnormality.  Bilateral  gynecomastia. No suspicious lytic or blastic osseous lesions. No acute displaced fracture. Multilevel degenerative changes of the spine. Review of the MIP images confirms the above findings. IMPRESSION: 1. Likely left lower lobe subsegmental pulmonary embolus with no associated pulmonary infarction or right heart strain. 2. Fontan procedure surgical changes. Electronically Signed   By: Tish Frederickson M.D.   On:  10/15/2023 22:39   DG Chest Port 1 View Result Date: 10/15/2023 CLINICAL DATA:  Cough and hypoxia EXAM: PORTABLE CHEST 1 VIEW COMPARISON:  10/22/2016 FINDINGS: Increased size of the cardiomediastinal silhouette compared to 10/22/2016. Cardiac and mediastinal closure devices. Pulmonary vascular congestion. No focal consolidation, pleural effusion, or pneumothorax. No displaced rib fractures. IMPRESSION: 1. Increased size of the cardiomediastinal silhouette compared to 10/22/2016. This may be due to hypertrophy or pericardial effusion. Recommend correlation with echocardiogram. 2. Pulmonary vascular congestion. Electronically Signed   By: Minerva Fester M.D.   On: 10/15/2023 21:36     Assessment and Plan:   Micheal Leonard is a 28 y.o. 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 Gwenlyn Fudge in the emergency department.  Cardiology team has been consulted to aid with management of his cardiomyopathy in the setting of his congenital heart disease.    He does not currently have any symptoms of myocardial ischemia or heart failure.  He has a known LVEF of 25% back in Sept of 2024.  He appears euvolemic on exam with subjective history of poor PO intake and an elevated serum creatinine.  Reasonable to trial IV fluids (agree with 500cc as discussed with the  consulting ER physician) and encourage good PO intake.   Appreciate the assistance of the medical team with workup of his primary complaint.   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 Ideally we will be able to restart all GDMT prior to discharge   For his HTN, recommend restarting his home amlodipine 5 mg daily   Risk Assessment/Risk Scores:        For questions or updates, please contact [facility] Please consult [email or web address] for contact info under    Signed, Sharyon Medicus, MD  10/15/2023 11:45 PM

## 2023-10-15 NOTE — ED Triage Notes (Signed)
 Patient reports intermittent right side body numbness onset approx. 6 am this morning with mild nausea, speech clear with no facial asymmetry , alert and oriented /ambulatory . Tachycardic  at arrival .

## 2023-10-15 NOTE — ED Notes (Signed)
 1st lac 0.95 in normal range, 2nd not needed and be canceled

## 2023-10-15 NOTE — ED Provider Notes (Signed)
 [place] EMERGENCY DEPARTMENT AT [facility] Provider Note   CSN: [id number] Arrival date & time: 10/15/23  1938     History  Chief Complaint  Patient presents with   Right Side Numbness / Tachycardic    Micheal Leonard is a 28 y.o. male.  HPI 28 year old male with a history of HIV and hypertension presents with right leg pain.  Patient has been feeling poorly for about 3 days.  This includes subjective fever, cough, shortness of breath, vomiting and headache.  Starting this morning he developed right leg pain.  It is his entire leg is painful to move.  Does not seem swollen to him.  It was reported that he had numbness but he denies numbness and he denies weakness.  Home Medications Prior to Admission medications   Medication Sig Start Date End Date Taking? Authorizing Provider  amLODipine (NORVASC) 5 MG tablet Take 1 tablet (5 mg total) by mouth daily. Patient not taking: Reported on 02/12/2023 10/31/21   [name], Lisette Grinder, MD  amLODipine (NORVASC) 5 MG tablet Take 1 tablet (5 mg total) by mouth daily. 02/12/23   Randall Hiss, MD  cabotegravir & rilpivirine ER (CABENUVA) 600 & 900 MG/3ML injection Inject 1 kit into the muscle every 2 (two) months. 12/02/22   Jennette Kettle, RPH-CPP  empagliflozin (JARDIANCE) 10 MG TABS tablet Take 1 tablet (10 mg total) by mouth daily before breakfast. 02/12/23   [name], Lisette Grinder, MD  fexofenadine (ALLEGRA) 180 MG tablet Take 180 mg by mouth daily. Patient not taking: Reported on 05/08/2022    [provider]      Allergies    Other    Review of Systems   Review of Systems  Constitutional:  Positive for fever (subjective).  Respiratory:  Positive for cough and shortness of breath.   Gastrointestinal:  Positive for vomiting. Negative for abdominal pain and diarrhea.  Musculoskeletal:  Positive for myalgias.  Neurological:  Positive for headaches.    Physical Exam Updated Vital Signs BP (!) 174/117 (BP  Location: Right Arm)   Pulse (!) 124   Temp 98.1 F (36.7 C) (Oral)   Resp (!) 22   SpO2 (!) 87%  Physical Exam Vitals and nursing note reviewed.  Constitutional:      General: He is not in acute distress.    Appearance: He is well-developed. He is not ill-appearing or diaphoretic.  HENT:     Head: Normocephalic and atraumatic.  Cardiovascular:     Rate and Rhythm: Regular rhythm. Tachycardia present.     Pulses:          Dorsalis pedis pulses are 2+ on the right side.     Heart sounds: Normal heart sounds.  Pulmonary:     Effort: Pulmonary effort is normal.     Breath sounds: Normal breath sounds. No wheezing.  Abdominal:     Palpations: Abdomen is soft.     Tenderness: There is no abdominal tenderness.  Musculoskeletal:     Cervical back: Normal range of motion. No rigidity.     Comments: Diffuse tenderness in right thigh and calf. No swelling or erythema.  Skin:    General: Skin is warm and dry.  Neurological:     Mental Status: He is alert.     Comments: CN 3-12 grossly intact. 5/5 strength in all 4 extremities. Grossly normal sensation. Normal finger to nose.      ED Results / Procedures / Treatments   Labs (  all labs ordered are listed, but only abnormal results are displayed) Labs Reviewed  COMPREHENSIVE METABOLIC PANEL WITH GFR - Abnormal; Notable for the following components:      Result Value   CO2 18 (*)    Glucose, Bld 109 (*)    Creatinine, Ser 1.29 (*)    Calcium 8.8 (*)    AST 45 (*)    All other components within normal limits  PROTIME-INR - Abnormal; Notable for the following components:   Prothrombin Time 15.4 (*)    All other components within normal limits  URINALYSIS, W/ REFLEX TO CULTURE (INFECTION SUSPECTED) - Abnormal; Notable for the following components:   Protein, ur 100 (*)    Bacteria, UA RARE (*)    All other components within normal limits  CK - Abnormal; Notable for the following components:   Total CK 598 (*)    All other  components within normal limits  BRAIN NATRIURETIC PEPTIDE - Abnormal; Notable for the following components:   B Natriuretic Peptide 361.3 (*)    All other components within normal limits  TROPONIN I (HIGH SENSITIVITY) - Abnormal; Notable for the following components:   Troponin I (High Sensitivity) 40 (*)    All other components within normal limits  CULTURE, BLOOD (ROUTINE X 2)  CULTURE, BLOOD (ROUTINE X 2)  RESP PANEL BY RT-PCR (RSV, FLU A&B, COVID)  RVPGX2  CBC WITH DIFFERENTIAL/PLATELET  APTT  I-STAT CG4 LACTIC ACID, ED  TROPONIN I (HIGH SENSITIVITY)    EKG EKG Interpretation Date/Time:  Wednesday October 15 2023 21:07:27 EDT Ventricular Rate:  117 PR Interval:  125 QRS Duration:  116 QT Interval:  356 QTC Calculation: 497 R Axis:   -14  Text Interpretation: Sinus tachycardia LVH with IVCD and secondary repol abnrm Prolonged QT interval similar to earlier in the day Confirmed by Pricilla Loveless [phone]) on 10/15/2023 10:37:00 PM  Radiology CT Angio Chest PE W and/or Wo Contrast Result Date: 10/15/2023 CLINICAL DATA:  Pulmonary embolism (PE) suspected, high prob. intermittent right side body numbness onset approx. 6 am this morning with mild nausea, speech clear with no facial asymmetry , alert and oriented /ambulatory . History of tricuspid atresia and Fontan procedure. EXAM: CT ANGIOGRAPHY CHEST WITH CONTRAST TECHNIQUE: Multidetector CT imaging of the chest was performed using the standard protocol during bolus administration of intravenous contrast. Multiplanar CT image reconstructions and MIPs were obtained to evaluate the vascular anatomy. RADIATION DOSE REDUCTION: This exam was performed according to the departmental dose-optimization program which includes automated exposure control, adjustment of the mA and/or kV according to patient size and/or use of iterative reconstruction technique. CONTRAST:  75mL OMNIPAQUE IOHEXOL 350 MG/ML SOLN COMPARISON:  CT angio chest 10/23/2016  FINDINGS: Cardiovascular: Prior Fontan procedure surgical changes noted. Satisfactory opacification of the pulmonary arteries to the segmental level. Likely left lower lobe subsegmental pulmonary embolus (7:294). Enlarged right heart size consistent with history of tricuspid atresia. No pericardial effusion. Mediastinum/Nodes: No enlarged mediastinal, hilar, or axillary lymph nodes. Thyroid gland, trachea, and esophagus demonstrate no significant findings. Lungs/Pleura: No focal consolidation. Stable right upper lobe 5 x 4 mm pulmonary nodule. No pulmonary mass. No pleural effusion. No pneumothorax. Upper Abdomen: No acute abnormality. Musculoskeletal: No chest wall abnormality.  Bilateral gynecomastia. No suspicious lytic or blastic osseous lesions. No acute displaced fracture. Multilevel degenerative changes of the spine. Review of the MIP images confirms the above findings. IMPRESSION: 1. Likely left lower lobe subsegmental pulmonary embolus with no associated pulmonary infarction or right heart  strain. 2. Fontan procedure surgical changes. Electronically Signed   By: Tish Frederickson M.D.   On: 10/15/2023 22:39   DG Chest Port 1 View Result Date: 10/15/2023 CLINICAL DATA:  Cough and hypoxia EXAM: PORTABLE CHEST 1 VIEW COMPARISON:  10/22/2016 FINDINGS: Increased size of the cardiomediastinal silhouette compared to 10/22/2016. Cardiac and mediastinal closure devices. Pulmonary vascular congestion. No focal consolidation, pleural effusion, or pneumothorax. No displaced rib fractures. IMPRESSION: 1. Increased size of the cardiomediastinal silhouette compared to 10/22/2016. This may be due to hypertrophy or pericardial effusion. Recommend correlation with echocardiogram. 2. Pulmonary vascular congestion. Electronically Signed   By: Minerva Fester M.D.   On: 10/15/2023 21:36    Procedures Ultrasound ED Echo  Date/Time: 10/15/2023 9:57 PM  Performed by: Pricilla Loveless, MD Authorized by: Pricilla Loveless, MD    Procedure details:    Indications: dyspnea     Views: subxiphoid and parasternal long axis view     Images: archived     Limitations:  Acoustic shadowing and body habitus Findings:    Pericardium: no pericardial effusion     Cardiac Activity: hyperdynamic       Medications Ordered in ED Medications  sodium chloride 0.9 % bolus 500 mL (has no administration in time range)  iohexol (OMNIPAQUE) 350 MG/ML injection 75 mL (75 mLs Intravenous Contrast Given 10/15/23 2226)    ED Course/ Medical Decision Making/ A&P                                 Medical Decision Making Amount and/or Complexity of Data Reviewed Labs: ordered.    Details: Normal WBC.  Mildly elevated troponin. Radiology: ordered and independent interpretation performed.    Details: Enlarged heart. ECG/medicine tests: ordered and independent interpretation performed.    Details: Sinus tachycardia.  Risk Prescription drug management. Decision regarding hospitalization.   Patient presents with multiple symptoms and is found to be hypertensive, tachycardic, mildly hypoxic.  Will start on some gentle fluids.  Question of CHF on his chest x-ray but not seen on the CTA.  No pericardial effusion on my bedside echo or CTA.  He has a minimal subsegmental PE, likely not clinically significant.  Will defer this treatment to the inpatient team.  Chart review shows he has a significantly depressed EF from outside records including most recently around 25%.  Will start some gentle fluids as I think he is more likely dry with the vomiting recently.  I have also consulted cardiology given his complicated congenital heart disease, Dr. Jean Rosenthal, who will see patient.  Otherwise, patient will need admission.  There is no obvious sign of a bacterial infection.  His respiratory panel is still pending. Dr. Arlean Hopping to admit.        Final Clinical Impression(s) / ED Diagnoses Final diagnoses:  Acute respiratory failure with hypoxia  [facility])    Rx / DC Orders ED Discharge Orders     None         Pricilla Loveless, MD 10/15/23 [phone]

## 2023-10-15 NOTE — H&P (Signed)
 History and Physical      Micheal Leonard MVH:[id number] DOB: 27-Aug-1995 DOA: 10/15/2023; DOS: 10/15/2023  PCP: Pcp, No *** Patient coming from: home ***  I have personally briefly reviewed patient's old medical records in [facility]  Chief Complaint: ***  HPI: Micheal Leonard is a 28 y.o. male with medical history significant for *** who is admitted to [facility] on 10/15/2023 with *** after presenting from home*** to [facility] ED complaining of ***.    ***       ***   ED Course:  Vital signs in the ED were notable for the following: ***  Labs were notable for the following: ***  Per my interpretation, EKG in ED demonstrated the following:  ***  Imaging in the ED, per corresponding formal radiology read, was notable for the following:  ***  Given his history of CHF, EDP discussed patient's case with on-call cardiology fellow, Dr. Jean Rosenthal, who conveyed that cardiology will formally consult.  Cardiology fellow, feels, clinically, that the patient may be mildly dehydrated, and is amenable to gentle IV fluids, with additional recommendations pending at this time.   While in the ED, the following were administered: ***  Subsequently, the patient was admitted  ***  ***red    Review of Systems: As per HPI otherwise 10 point review of systems negative.   Past Medical History:  Diagnosis Date   Cardiac anomaly, congenital    Diarrhea 01/22/2017   Dizziness 05/30/2020   EBV infection    hx/notes 10/23/2016   Erectile dysfunction 05/30/2020   Gonorrhea 07/20/2019   Hemoptysis 10/23/2016   [name] 10/23/2016   HIV (human immunodeficiency virus infection) (HCC)    Hypertension    Hypertension 04/30/2021   Migraine    "a few/year now; frequency is less than in the past" (10/23/2016)   Nonadherence to medical treatment 09/08/2019   Pharyngitis 10/23/2016   notes 10/23/2016   Pulmonary embolism (HCC) 01/29/2016   S/P bidirectional Glenn shunt    hx/notes 10/23/2016    S/P Fontan procedure    hx/notes 10/23/2016   Syphilis 07/20/2019   Tricuspid atresia and stenosis, congenital     Past Surgical History:  Procedure Laterality Date   CARDIAC CATHETERIZATION  01/2016   at [facility]; hx/notes 10/23/2016   CARDIAC SURGERY  01/1997; 2000   fontane procedure at [facility] as a child     Social History:  reports that he has never smoked. He has never used smokeless tobacco. He reports that he does not currently use alcohol. He reports that he does not currently use drugs after having used the following drugs: Marijuana. Frequency: 2.00 times per week.   Allergies  Allergen Reactions   Other     An HIV drug and can not remember  exactly.    Family History  Problem Relation Age of Onset   Hypertension Father     Family history reviewed and not pertinent ***   Prior to Admission medications   Medication Sig Start Date End Date Taking? Authorizing Provider  amLODipine (NORVASC) 5 MG tablet Take 1 tablet (5 mg total) by mouth daily. Patient not taking: Reported on 02/12/2023 10/31/21   [name], Lisette Grinder, MD  amLODipine (NORVASC) 5 MG tablet Take 1 tablet (5 mg total) by mouth daily. 02/12/23   Randall Hiss, MD  cabotegravir & rilpivirine ER (CABENUVA) 600 & 900 MG/3ML injection Inject 1 kit into the muscle every 2 (two) months. 12/02/22   Jennette Kettle, RPH-CPP  empagliflozin (JARDIANCE) 10 MG TABS tablet Take 1 tablet (10 mg total) by mouth daily before breakfast. 02/12/23   [name], Lisette Grinder, MD  fexofenadine (ALLEGRA) 180 MG tablet Take 180 mg by mouth daily. Patient not taking: Reported on 05/08/2022    [provider]     Objective    Physical Exam: Vitals:   10/15/23 1950 10/15/23 2056  BP: (!) 174/117   Pulse: (!) 124   Resp: (!) 22   Temp: 98.1 F (36.7 C)   TempSrc: Oral   SpO2: 90% (!) 87%    General: appears to be stated age; alert, oriented Skin: warm, dry, no rash Head:  AT/[place] Mouth:  Oral mucosa membranes appear  moist, normal dentition Neck: supple; trachea midline Heart:  RRR; did not appreciate any M/R/G Lungs: CTAB, did not appreciate any wheezes, rales, or rhonchi Abdomen: + BS; soft, ND, NT Vascular: 2+ pedal pulses b/l; 2+ radial pulses b/l Extremities: no peripheral edema, no muscle wasting Neuro: strength and sensation intact in upper and lower extremities b/l ***   *** Neuro: 5/5 strength of the proximal and distal flexors and extensors of the upper and lower extremities bilaterally; sensation intact in upper and lower extremities b/l; cranial nerves II through XII grossly intact; no pronator drift; no evidence suggestive of slurred speech, dysarthria, or facial droop; Normal muscle tone. No tremors.  *** Neuro: In the setting of the patient's current mental status and associated inability to follow instructions, unable to perform full neurologic exam at this time.  As such, assessment of strength, sensation, and cranial nerves is limited at this time. Patient noted to spontaneously move all 4 extremities. No tremors.  ***    Labs on Admission: I have personally reviewed following labs and imaging studies  CBC: Recent Labs  Lab 10/15/23 2040  WBC 8.3  NEUTROABS 5.5  HGB 16.4  HCT 49.7  MCV 88.0  PLT 255   Basic Metabolic Panel: Recent Labs  Lab 10/15/23 2040  NA 138  K 3.6  CL 110  CO2 18*  GLUCOSE 109*  BUN 10  CREATININE 1.29*  CALCIUM 8.8*   GFR: CrCl cannot be calculated (Unknown ideal weight.). Liver Function Tests: Recent Labs  Lab 10/15/23 2040  AST 45*  ALT 34  ALKPHOS 89  BILITOT 1.2  PROT 7.4  ALBUMIN 3.7   No results for input(s): "LIPASE", "AMYLASE" in the last 168 hours. No results for input(s): "AMMONIA" in the last 168 hours. Coagulation Profile: Recent Labs  Lab 10/15/23 2052  INR 1.2   Cardiac Enzymes: Recent Labs  Lab 10/15/23 2030  CKTOTAL 598*   BNP (last 3 results) No results for input(s): "PROBNP" in the last 8760  hours. HbA1C: No results for input(s): "HGBA1C" in the last 72 hours. CBG: No results for input(s): "GLUCAP" in the last 168 hours. Lipid Profile: No results for input(s): "CHOL", "HDL", "LDLCALC", "TRIG", "CHOLHDL", "LDLDIRECT" in the last 72 hours. Thyroid Function Tests: No results for input(s): "TSH", "T4TOTAL", "FREET4", "T3FREE", "THYROIDAB" in the last 72 hours. Anemia Panel: No results for input(s): "VITAMINB12", "FOLATE", "FERRITIN", "TIBC", "IRON", "RETICCTPCT" in the last 72 hours. Urine analysis:    Component Value Date/Time   COLORURINE YELLOW 10/15/2023 2053   APPEARANCEUR CLEAR 10/15/2023 2053   LABSPEC 1.013 10/15/2023 2053   PHURINE 7.0 10/15/2023 2053   GLUCOSEU NEGATIVE 10/15/2023 2053   HGBUR NEGATIVE 10/15/2023 2053   BILIRUBINUR NEGATIVE 10/15/2023 2053   KETONESUR NEGATIVE 10/15/2023 2053   PROTEINUR 100 (  A) 10/15/2023 2053   NITRITE NEGATIVE 10/15/2023 2053   LEUKOCYTESUR NEGATIVE 10/15/2023 2053    Radiological Exams on Admission: CT Angio Chest PE W and/or Wo Contrast Result Date: 10/15/2023 CLINICAL DATA:  Pulmonary embolism (PE) suspected, high prob. intermittent right side body numbness onset approx. 6 am this morning with mild nausea, speech clear with no facial asymmetry , alert and oriented /ambulatory . History of tricuspid atresia and Fontan procedure. EXAM: CT ANGIOGRAPHY CHEST WITH CONTRAST TECHNIQUE: Multidetector CT imaging of the chest was performed using the standard protocol during bolus administration of intravenous contrast. Multiplanar CT image reconstructions and MIPs were obtained to evaluate the vascular anatomy. RADIATION DOSE REDUCTION: This exam was performed according to the departmental dose-optimization program which includes automated exposure control, adjustment of the mA and/or kV according to patient size and/or use of iterative reconstruction technique. CONTRAST:  75mL OMNIPAQUE IOHEXOL 350 MG/ML SOLN COMPARISON:  CT angio chest  10/23/2016 FINDINGS: Cardiovascular: Prior Fontan procedure surgical changes noted. Satisfactory opacification of the pulmonary arteries to the segmental level. Likely left lower lobe subsegmental pulmonary embolus (7:294). Enlarged right heart size consistent with history of tricuspid atresia. No pericardial effusion. Mediastinum/Nodes: No enlarged mediastinal, hilar, or axillary lymph nodes. Thyroid gland, trachea, and esophagus demonstrate no significant findings. Lungs/Pleura: No focal consolidation. Stable right upper lobe 5 x 4 mm pulmonary nodule. No pulmonary mass. No pleural effusion. No pneumothorax. Upper Abdomen: No acute abnormality. Musculoskeletal: No chest wall abnormality.  Bilateral gynecomastia. No suspicious lytic or blastic osseous lesions. No acute displaced fracture. Multilevel degenerative changes of the spine. Review of the MIP images confirms the above findings. IMPRESSION: 1. Likely left lower lobe subsegmental pulmonary embolus with no associated pulmonary infarction or right heart strain. 2. Fontan procedure surgical changes. Electronically Signed   By: Tish Frederickson M.D.   On: 10/15/2023 22:39   DG Chest Port 1 View Result Date: 10/15/2023 CLINICAL DATA:  Cough and hypoxia EXAM: PORTABLE CHEST 1 VIEW COMPARISON:  10/22/2016 FINDINGS: Increased size of the cardiomediastinal silhouette compared to 10/22/2016. Cardiac and mediastinal closure devices. Pulmonary vascular congestion. No focal consolidation, pleural effusion, or pneumothorax. No displaced rib fractures. IMPRESSION: 1. Increased size of the cardiomediastinal silhouette compared to 10/22/2016. This may be due to hypertrophy or pericardial effusion. Recommend correlation with echocardiogram. 2. Pulmonary vascular congestion. Electronically Signed   By: Minerva Fester M.D.   On: 10/15/2023 21:36      Assessment/Plan   Active Problems:   SOB (shortness of  breath)   ***            ***                  ***                   ***                  ***                  ***                  ***                   ***                  ***                  ***                  ***                  ***                 ***                ***  DVT prophylaxis: SCD's ***  Code Status: Full code*** Family Communication: none*** Disposition Plan: Per Rounding Team Consults called: EDP discussed patient's case with on-call cardiology fellow, Dr. Jean Rosenthal, who conveyed that cardiology will formally consult. ;  Admission status: ***     I SPENT GREATER THAN 75 *** MINUTES IN CLINICAL CARE TIME/MEDICAL DECISION-MAKING IN COMPLETING THIS ADMISSION.      Chaney Born Tarnesha Ulloa DO [facility]  From 7PM - 7AM   10/15/2023, 11:40 PM   ***

## 2023-10-16 ENCOUNTER — Inpatient Hospital Stay (HOSPITAL_COMMUNITY)

## 2023-10-16 ENCOUNTER — Encounter (HOSPITAL_COMMUNITY): Payer: Self-pay | Admitting: Internal Medicine

## 2023-10-16 DIAGNOSIS — R7989 Other specified abnormal findings of blood chemistry: Secondary | ICD-10-CM | POA: Diagnosis present

## 2023-10-16 DIAGNOSIS — R0602 Shortness of breath: Secondary | ICD-10-CM

## 2023-10-16 DIAGNOSIS — I5023 Acute on chronic systolic (congestive) heart failure: Secondary | ICD-10-CM | POA: Diagnosis present

## 2023-10-16 DIAGNOSIS — J9601 Acute respiratory failure with hypoxia: Secondary | ICD-10-CM | POA: Diagnosis present

## 2023-10-16 DIAGNOSIS — J209 Acute bronchitis, unspecified: Secondary | ICD-10-CM | POA: Diagnosis present

## 2023-10-16 DIAGNOSIS — J309 Allergic rhinitis, unspecified: Secondary | ICD-10-CM | POA: Diagnosis present

## 2023-10-16 DIAGNOSIS — M79606 Pain in leg, unspecified: Secondary | ICD-10-CM

## 2023-10-16 DIAGNOSIS — I5022 Chronic systolic (congestive) heart failure: Secondary | ICD-10-CM | POA: Diagnosis present

## 2023-10-16 DIAGNOSIS — I517 Cardiomegaly: Secondary | ICD-10-CM | POA: Diagnosis not present

## 2023-10-16 DIAGNOSIS — I10 Essential (primary) hypertension: Secondary | ICD-10-CM | POA: Diagnosis present

## 2023-10-16 DIAGNOSIS — R000 Tachycardia, unspecified: Secondary | ICD-10-CM | POA: Diagnosis not present

## 2023-10-16 LAB — URINALYSIS, ROUTINE W REFLEX MICROSCOPIC
Bilirubin Urine: NEGATIVE
Glucose, UA: NEGATIVE mg/dL
Ketones, ur: NEGATIVE mg/dL
Leukocytes,Ua: NEGATIVE
Nitrite: NEGATIVE
Protein, ur: 100 mg/dL — AB
Specific Gravity, Urine: 1.025 (ref 1.005–1.030)
pH: 6 (ref 5.0–8.0)

## 2023-10-16 LAB — CBC WITH DIFFERENTIAL/PLATELET
Abs Immature Granulocytes: 0.02 10*3/uL (ref 0.00–0.07)
Basophils Absolute: 0.1 10*3/uL (ref 0.0–0.1)
Basophils Relative: 1 %
Eosinophils Absolute: 0.1 10*3/uL (ref 0.0–0.5)
Eosinophils Relative: 2 %
HCT: 46.2 % (ref 39.0–52.0)
Hemoglobin: 15.6 g/dL (ref 13.0–17.0)
Immature Granulocytes: 0 %
Lymphocytes Relative: 25 %
Lymphs Abs: 1.5 10*3/uL (ref 0.7–4.0)
MCH: 29.5 pg (ref 26.0–34.0)
MCHC: 33.8 g/dL (ref 30.0–36.0)
MCV: 87.3 fL (ref 80.0–100.0)
Monocytes Absolute: 0.6 10*3/uL (ref 0.1–1.0)
Monocytes Relative: 10 %
Neutro Abs: 3.7 10*3/uL (ref 1.7–7.7)
Neutrophils Relative %: 62 %
Platelets: 228 10*3/uL (ref 150–400)
RBC: 5.29 MIL/uL (ref 4.22–5.81)
RDW: 14.1 % (ref 11.5–15.5)
WBC: 6 10*3/uL (ref 4.0–10.5)
nRBC: 0 % (ref 0.0–0.2)

## 2023-10-16 LAB — ECHOCARDIOGRAM COMPLETE
Area-P 1/2: 4.33 cm2
MV M vel: 4.82 m/s
MV Peak grad: 92.9 mmHg
Radius: 0.8 cm
S' Lateral: 6.4 cm

## 2023-10-16 LAB — COMPREHENSIVE METABOLIC PANEL WITH GFR
ALT: 28 U/L (ref 0–44)
AST: 36 U/L (ref 15–41)
Albumin: 3.3 g/dL — ABNORMAL LOW (ref 3.5–5.0)
Alkaline Phosphatase: 74 U/L (ref 38–126)
Anion gap: 9 (ref 5–15)
BUN: 10 mg/dL (ref 6–20)
CO2: 17 mmol/L — ABNORMAL LOW (ref 22–32)
Calcium: 8.3 mg/dL — ABNORMAL LOW (ref 8.9–10.3)
Chloride: 110 mmol/L (ref 98–111)
Creatinine, Ser: 1.12 mg/dL (ref 0.61–1.24)
GFR, Estimated: >60 mL/min (ref >60)
Glucose, Bld: 106 mg/dL — ABNORMAL HIGH (ref 70–99)
Potassium: 3.7 mmol/L (ref 3.5–5.1)
Sodium: 136 mmol/L (ref 135–145)
Total Bilirubin: 1.6 mg/dL — ABNORMAL HIGH (ref 0.0–1.2)
Total Protein: 7.1 g/dL (ref 6.5–8.1)

## 2023-10-16 LAB — BRAIN NATRIURETIC PEPTIDE: B Natriuretic Peptide: 374.3 pg/mL — ABNORMAL HIGH (ref 0.0–100.0)

## 2023-10-16 LAB — URINALYSIS, MICROSCOPIC (REFLEX)
Squamous Epithelial / HPF: NONE SEEN /HPF (ref 0–5)
WBC, UA: NONE SEEN WBC/hpf (ref 0–5)

## 2023-10-16 LAB — PROCALCITONIN: Procalcitonin: <0.1 ng/mL

## 2023-10-16 LAB — TROPONIN I (HIGH SENSITIVITY)
Troponin I (High Sensitivity): 44 ng/L — ABNORMAL HIGH (ref <18)
Troponin I (High Sensitivity): 32 ng/L — ABNORMAL HIGH (ref <18)

## 2023-10-16 LAB — PHOSPHORUS: Phosphorus: 3.1 mg/dL (ref 2.5–4.6)

## 2023-10-16 LAB — MAGNESIUM
Magnesium: 1.9 mg/dL (ref 1.7–2.4)
Magnesium: 2 mg/dL (ref 1.7–2.4)

## 2023-10-16 MED ORDER — IPRATROPIUM BROMIDE 0.02 % IN SOLN
0.5000 mg | Freq: Four times a day (QID) | RESPIRATORY_TRACT | Status: DC
  Administered 2023-10-16 – 2023-10-17 (×4): 0.5 mg via RESPIRATORY_TRACT
  Filled 2023-10-16 (×4): qty 2.5

## 2023-10-16 MED ORDER — LORATADINE 10 MG PO TABS
10.0000 mg | ORAL_TABLET | Freq: Every day | ORAL | Status: DC
  Administered 2023-10-16 – 2023-10-19 (×4): 10 mg via ORAL
  Filled 2023-10-16 (×4): qty 1

## 2023-10-16 MED ORDER — ASPIRIN 81 MG PO TBEC
81.0000 mg | DELAYED_RELEASE_TABLET | Freq: Every day | ORAL | Status: DC
  Administered 2023-10-16 – 2023-10-19 (×4): 81 mg via ORAL
  Filled 2023-10-16 (×4): qty 1

## 2023-10-16 MED ORDER — POTASSIUM CHLORIDE CRYS ER 20 MEQ PO TBCR
40.0000 meq | EXTENDED_RELEASE_TABLET | Freq: Once | ORAL | Status: AC
  Administered 2023-10-16: 40 meq via ORAL
  Filled 2023-10-16: qty 2

## 2023-10-16 MED ORDER — FUROSEMIDE 10 MG/ML IJ SOLN
80.0000 mg | Freq: Once | INTRAMUSCULAR | Status: AC
Start: 2023-10-16 — End: 2023-10-16
  Administered 2023-10-16: 80 mg via INTRAVENOUS
  Filled 2023-10-16: qty 8

## 2023-10-16 MED ORDER — MAGNESIUM SULFATE IN D5W 1-5 GM/100ML-% IV SOLN
1.0000 g | Freq: Once | INTRAVENOUS | Status: AC
  Administered 2023-10-16: 1 g via INTRAVENOUS
  Filled 2023-10-16: qty 100

## 2023-10-16 MED ORDER — LEVALBUTEROL HCL 1.25 MG/0.5ML IN NEBU
1.2500 mg | INHALATION_SOLUTION | Freq: Four times a day (QID) | RESPIRATORY_TRACT | Status: DC
  Administered 2023-10-16 (×2): 1.25 mg via RESPIRATORY_TRACT
  Filled 2023-10-16 (×6): qty 0.5

## 2023-10-16 MED ORDER — SACUBITRIL-VALSARTAN 24-26 MG PO TABS
1.0000 | ORAL_TABLET | Freq: Two times a day (BID) | ORAL | Status: DC
  Administered 2023-10-16 – 2023-10-17 (×2): 1 via ORAL
  Filled 2023-10-16 (×2): qty 1

## 2023-10-16 MED ORDER — EMPAGLIFLOZIN 10 MG PO TABS
10.0000 mg | ORAL_TABLET | Freq: Every day | ORAL | Status: DC
  Administered 2023-10-16 – 2023-10-19 (×4): 10 mg via ORAL
  Filled 2023-10-16 (×4): qty 1

## 2023-10-16 MED ORDER — AMLODIPINE BESYLATE 5 MG PO TABS
5.0000 mg | ORAL_TABLET | Freq: Every day | ORAL | Status: DC
  Administered 2023-10-16 – 2023-10-19 (×4): 5 mg via ORAL
  Filled 2023-10-16 (×4): qty 1

## 2023-10-16 NOTE — Progress Notes (Signed)
 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  There was no chest pain or palpitations diaphoresis.  No orthopnea PND or edema.  Has history of PE in 2017.  Follows up at [facility] cardiology due to complex procedures.  HIV with recent CD4 count of 330.  He is on bimonthly monthly injection With Cabenuv last dose 3/26. At the emergency room he was afebrile.  Initial heart rate 120.  87% on room air.  Started on 3 L nasal cannula oxygen.  Electrolytes fairly normal.  Creatinine 1.29 slightly worse than usual.  BNP 361.  High sensitive troponins borderline elevated.  COVID, influenza, RSV negative.  Blood cultures were drawn.  CTA showed possible left lower lobe subsegmental PE.  No pneumonia.  Patient was started on oxygen and admitted to the hospital.   Subjective: Patient seen and examined along with cardiology service.  He is complaining of some dry cough but denies any other complaints.  Currently on 4 L oxygen.  Leg pain has improved.  Does not have any orthopnea or PND.   Assessment & Plan:   Acute hypoxemic respiratory failure: Multifactorial. Acute bronchitis with no evidence of bacterial infection. Chronic systolic heart failure with known ejection fraction of 25%.  Possible bronchitis with no evidence of pneumonia.  Procalcitonin less than 0.1.  Bronchodilator therapy, mucolytic's and respiratory therapy with incentive and flutter valve.  Keep on oxygen to keep saturation more than 90%. Working on cardiac etiology, rule out worsening EF.  Repeat echocardiogram today.   Holding off on diuretics as patient was given gentle IV fluids overnight for dehydration.  Cardiology following. Reported possible subsegmental PE, likely flow defect due to a structural problem.  Will check D-dimer and lower extremity duplexes and treat only if D-dimer or duplex is positive.  HIV disease: Documented well-controlled.  On bimonthly injection.  Last dose 3/26.  Essential hypertension: Blood pressure stable.  Resume home medications including Norvasc.   DVT prophylaxis: SCDs Start: 10/15/23 2340   Code Status: Full code Family Communication: None at the bedside Disposition Plan: Status is: Inpatient Remains inpatient appropriate because: Significant new onset hypoxemia.     Consultants:  Cardiology  Procedures:  None  Antimicrobials:  None     Objective: Vitals:   10/16/23 0709 10/16/23 0754 10/16/23 0803 10/16/23 0810  BP: (!) 149/105   (!) 164/118  Pulse: (!) 101   (!) 109  Resp:    (!) 22  Temp: 98.2 F (36.8 C)   98.1 F (36.7 C)  TempSrc: Oral   Oral  SpO2: 92% (!) 88% 91% 91%   No intake or output data in the 24 hours ending 10/16/23 1112 There were no vitals filed for this visit.  Examination:  General exam: Appears calm and comfortable  Respiratory system: Clear to auscultation. Respiratory effort normal.  No added sounds.  Currently on 4 L oxygen. Cardiovascular system: S1 & S2 heard, RRR. No JVD, murmurs, rubs, gallops or clicks. No pedal edema. Gastrointestinal system: Abdomen is nondistended, soft and nontender. No organomegaly or masses felt. Normal bowel sounds heard. Central nervous system: Alert  and oriented. No focal neurological deficits. Extremities: Symmetric 5 x 5 power. Skin: No rashes, lesions or ulcers Psychiatry: Judgement and insight appear normal. Mood & affect appropriate.     Data Reviewed: I have personally reviewed following labs and imaging studies  CBC: Recent Labs  Lab 10/15/23 2040 10/16/23 0532  WBC 8.3  6.0  NEUTROABS 5.5 3.7  HGB 16.4 15.6  HCT 49.7 46.2  MCV 88.0 87.3  PLT 255 228   Basic Metabolic Panel: Recent Labs  Lab 10/15/23 2040 10/15/23 2321 10/16/23 0532  NA 138  --  136  K 3.6  --  3.7  CL 110  --  110  CO2 18*  --  17*  GLUCOSE 109*  --  106*  BUN 10  --  10  CREATININE 1.29*  --  1.12  CALCIUM 8.8*  --  8.3*  MG  --  1.9 2.0  PHOS  --   --  3.1   GFR: CrCl cannot be calculated (Unknown ideal weight.). Liver Function Tests: Recent Labs  Lab 10/15/23 2040 10/16/23 0532  AST 45* 36  ALT 34 28  ALKPHOS 89 74  BILITOT 1.2 1.6*  PROT 7.4 7.1  ALBUMIN 3.7 3.3*   No results for input(s): "LIPASE", "AMYLASE" in the last 168 hours. No results for input(s): "AMMONIA" in the last 168 hours. Coagulation Profile: Recent Labs  Lab 10/15/23 2052  INR 1.2   Cardiac Enzymes: Recent Labs  Lab 10/15/23 2030  CKTOTAL 598*   BNP (last 3 results) No results for input(s): "PROBNP" in the last 8760 hours. HbA1C: No results for input(s): "HGBA1C" in the last 72 hours. CBG: No results for input(s): "GLUCAP" in the last 168 hours. Lipid Profile: No results for input(s): "CHOL", "HDL", "LDLCALC", "TRIG", "CHOLHDL", "LDLDIRECT" in the last 72 hours. Thyroid Function Tests: No results for input(s): "TSH", "T4TOTAL", "FREET4", "T3FREE", "THYROIDAB" in the last 72 hours. Anemia Panel: No results for input(s): "VITAMINB12", "FOLATE", "FERRITIN", "TIBC", "IRON", "RETICCTPCT" in the last 72 hours. Sepsis Labs: Recent Labs  Lab 10/15/23 2114 10/15/23 2321  PROCALCITON  --  <0.10  LATICACIDVEN 1.0  --     Recent Results (from the past 240 hours)  Resp panel by RT-PCR (RSV, Flu A&B, Covid) Anterior Nasal Swab     Status: None   Collection Time: 10/15/23  8:53 PM   Specimen: Anterior Nasal Swab  Result Value Ref Range Status   SARS Coronavirus 2 by RT PCR NEGATIVE NEGATIVE Final   Influenza A by PCR NEGATIVE NEGATIVE Final   Influenza B by PCR NEGATIVE NEGATIVE  Final    Comment: (NOTE) The Xpert Xpress SARS-CoV-2/FLU/RSV plus assay is intended as an aid in the diagnosis of influenza from Nasopharyngeal swab specimens and should not be used as a sole basis for treatment. Nasal washings and aspirates are unacceptable for Xpert Xpress SARS-CoV-2/FLU/RSV testing.  Fact Sheet for Patients: BloggerCourse.com  Fact Sheet for Healthcare Providers: SeriousBroker.it  This test is not yet approved or cleared by the [place] FDA and has been authorized for detection and/or diagnosis of SARS-CoV-2 by FDA under an Emergency Use Authorization (EUA). This EUA will remain in effect (meaning this test can be used) for the duration of the COVID-19 declaration under Section 564(b)(1) of the Act, 21 U.S.C. section 360bbb-3(b)(1), unless the authorization is terminated or revoked.     Resp Syncytial Virus by PCR NEGATIVE NEGATIVE Final    Comment: (NOTE) Fact Sheet for Patients: BloggerCourse.com  Fact Sheet for  Healthcare Providers: SeriousBroker.it  This test is not yet approved or cleared by the [place] and has been authorized for detection and/or diagnosis of SARS-CoV-2 by FDA under an Emergency Use Authorization (EUA). This EUA will remain in effect (meaning this test can be used) for the duration of the COVID-19 declaration under Section 564(b)(1) of the Act, 21 U.S.C. section 360bbb-3(b)(1), unless the authorization is terminated or revoked.  Performed at [facility], [address], [place], [place] [address]   Blood Culture (routine x 2)     Status: None (Preliminary result)   Collection Time: 10/15/23  9:00 PM   Specimen: BLOOD  Result Value Ref Range Status   Specimen Description BLOOD RIGHT ANTECUBITAL  Final   Special Requests   Final    BOTTLES DRAWN AEROBIC AND ANAEROBIC Blood Culture adequate volume   Culture    Final    NO GROWTH < 12 HOURS Performed at [facility], [address], [place], [place] [address]    Report Status PENDING  Incomplete  Blood Culture (routine x 2)     Status: None (Preliminary result)   Collection Time: 10/15/23  9:05 PM   Specimen: BLOOD LEFT FOREARM  Result Value Ref Range Status   Specimen Description BLOOD LEFT FOREARM  Final   Special Requests   Final    BOTTLES DRAWN AEROBIC AND ANAEROBIC Blood Culture adequate volume   Culture   Final    NO GROWTH < 12 HOURS Performed at [facility], [address] [place], [place] [address]    Report Status PENDING  Incomplete         Radiology Studies: DG Chest Port 1 View Result Date: 10/16/2023 CLINICAL DATA:  Hypoxic seal, tachycardia EXAM: PORTABLE CHEST 1 VIEW COMPARISON:  10/15/2023 FINDINGS: Lungs are clear. No pneumothorax or pleural effusion. Stable cardiomegaly. Embolization plugs are seen within the left perihilar region, unchanged. Pulmonary vascularity is normal. No acute bone abnormality. IMPRESSION: 1. No active disease. 2. Cardiomegaly. Electronically Signed   By: Helyn Numbers M.D.   On: 10/16/2023 04:12   CT Angio Chest PE W and/or Wo Contrast Result Date: 10/15/2023 CLINICAL DATA:  Pulmonary embolism (PE) suspected, high prob. intermittent right side body numbness onset approx. 6 am this morning with mild nausea, speech clear with no facial asymmetry , alert and oriented /ambulatory . History of tricuspid atresia and Fontan procedure. EXAM: CT ANGIOGRAPHY CHEST WITH CONTRAST TECHNIQUE: Multidetector CT imaging of the chest was performed using the standard protocol during bolus administration of intravenous contrast. Multiplanar CT image reconstructions and MIPs were obtained to evaluate the vascular anatomy. RADIATION DOSE REDUCTION: This exam was performed according to the departmental dose-optimization program which includes automated exposure control, adjustment of the mA and/or kV according  to patient size and/or use of iterative reconstruction technique. CONTRAST:  75mL OMNIPAQUE IOHEXOL 350 MG/ML SOLN COMPARISON:  CT angio chest 10/23/2016 FINDINGS: Cardiovascular: Prior Fontan procedure surgical changes noted. Satisfactory opacification of the pulmonary arteries to the segmental level. Likely left lower lobe subsegmental pulmonary embolus (7:294). Enlarged right heart size consistent with history of tricuspid atresia. No pericardial effusion. Mediastinum/Nodes: No enlarged mediastinal, hilar, or axillary lymph nodes. Thyroid gland, trachea, and esophagus demonstrate no significant findings. Lungs/Pleura: No focal consolidation. Stable right upper lobe 5 x 4 mm pulmonary nodule. No pulmonary mass. No pleural effusion. No pneumothorax. Upper Abdomen: No acute abnormality. Musculoskeletal: No chest wall abnormality.  Bilateral gynecomastia. No suspicious lytic or blastic  osseous lesions. No acute displaced fracture. Multilevel degenerative changes of the spine. Review of the MIP images confirms the above findings. IMPRESSION: 1. Likely left lower lobe subsegmental pulmonary embolus with no associated pulmonary infarction or right heart strain. 2. Fontan procedure surgical changes. Electronically Signed   By: Tish Frederickson M.D.   On: 10/15/2023 22:39   DG Chest Port 1 View Result Date: 10/15/2023 CLINICAL DATA:  Cough and hypoxia EXAM: PORTABLE CHEST 1 VIEW COMPARISON:  10/22/2016 FINDINGS: Increased size of the cardiomediastinal silhouette compared to 10/22/2016. Cardiac and mediastinal closure devices. Pulmonary vascular congestion. No focal consolidation, pleural effusion, or pneumothorax. No displaced rib fractures. IMPRESSION: 1. Increased size of the cardiomediastinal silhouette compared to 10/22/2016. This may be due to hypertrophy or pericardial effusion. Recommend correlation with echocardiogram. 2. Pulmonary vascular congestion. Electronically Signed   By: Minerva Fester M.D.   On:  10/15/2023 21:36        Scheduled Meds:  amLODipine  5 mg Oral Daily   aspirin EC  81 mg Oral Daily   ipratropium  0.5 mg Nebulization Q6H   levalbuterol  1.25 mg Nebulization Q6H   loratadine  10 mg Oral Daily   Continuous Infusions:   LOS: 1 day    Time spent: 50-minute    Dorcas Carrow, MD [facility]

## 2023-10-16 NOTE — Progress Notes (Signed)
 Pharmacy: [facility] Pharmacy Note  Consult received to review cabotegravir/rilpivirine Micheal Leonard) for this patient.   The patient's HIV is managed on bimonthly Cabenuva injections - last given by Margarite Gouge on 10/08/23, next doses due on 12/03/23 and 02/04/24.  No doses required during this inpatient admission. The patient is covered with his last dose on 10/08/23.  Thank you for allowing pharmacy to be a part of this patient's care.  Georgina Pillion, PharmD, BCPS, BCIDP Infectious Diseases Clinical Pharmacist 10/16/2023 9:20 AM   **Pharmacist phone directory can now be found on [email or web address] ([email or web address]).  Listed under [facility].

## 2023-10-16 NOTE — ED Notes (Signed)
 Pt ambulated to bathroom. Pt reported no sob.

## 2023-10-16 NOTE — Progress Notes (Signed)
 Received pt from ED via stretcher accompanied by [facility], RN. Pt is A&OX4. Pt denied CP, right leg pain and SOB. Pt is on HFNC 3L POX=91%. Lungs sounds clear and diminished throughout. Pt c/o 5/10 head which I gave him tylenol 650mg  PO.Pt is in NAD. VS were taken, and pt placed on telemetry. Magnesium 1gm IVP was initiated in LAC 20g IV. Pt MAE well.

## 2023-10-16 NOTE — ED Notes (Signed)
 Pt began satting at 89% on the 5 L. Placed patient on high flow nasal cannula. Advised MD Howerter.

## 2023-10-16 NOTE — Progress Notes (Signed)
 Echocardiogram 2D Echocardiogram has been performed.  Warren Lacy Liesa Tsan RDCS 10/16/2023, 2:05 PM

## 2023-10-16 NOTE — Plan of Care (Addendum)
 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  He has experienced shortness of breath and cough for the past two weeks, with a notable decline in his condition compared to two weeks ago when he felt much better. He is currently on four liters of oxygen, which is a significant increase from his baseline as he is not normally on oxygen (last saw Dr. Kirtland Bouchard at [facility] January 2025)  I personally reviewed his CTPE: A CT pulmonary embolism study was performed, showing contrast opacification from the superior vena cava to the right and left pulmonary trunk, with no evidence of baffle stenosis. No large PE was noted.  His past medical history includes tricuspid atresia, status post bidirectional Glenn procedure in 1998, and an extracardiac nonfenestrated Fontan procedure in 2002. He is followed by a congenital heart specialist and last saw Dr. Kirtland Bouchard at [facility] in January, at which time he was reportedly doing well.  No weight gain, swelling, or symptoms consistent with protein-losing enteropathy, such as diarrhea.  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, no sx suggestive of a protein losing enteropathy, UA sent, does not appear to have  - has VSD- will need Doppler over the VSD; given tricuspid atresia, he really shouldn't have restriction - s/p Algernon Huxley and Fontan, CT eval of PE is harded in this case an VQ Is unreliable, PV-MRV would be challenging here; D Dimer ordered; no evidence of Fontan hepatopathy save for BILI elevation, LFTs tomorrow; has hx of HIV on HAART as well - TRH treatment of bronchitis is reasonable; currently no barriers to being on [facility] -  will hope to restart GDMT today or tomorrow (SGLT2i post Echo)  Coordination plan:  - case reviewed with TRH team  Time spent 34 minutes   Riley Lam, MD FASE [facility] Cardiologist [facility]  [facility]  [address], [address] [place], [place] [address] [phone]  10:00 AM  LVEF severely reduced with Severe MR. D Dimer ordered (pending). I suspect HF to be the cause of hx symtoms. Will return SGLT2i. Will get Lasix 80 mg IV.  BP notably elevated- return ARNI  Riley Lam, MD FASE [facility] Cardiologist [facility]  [address] [place], [address] [place], [place] [address] [phone]  3:33 PM   Patient notes that he feels better. Off O2 (his preference) Sats 89%. He notes taking his medication. At home. Is pending the Lasix and ARNI and SGLT2i ordered earlier. Starting TOMORROW, we will start AHA Cardiac diet with 2 L restriction. (Compromise for him letting me give you IV lasix).  Riley Lam, MD FASE [facility] Cardiologist [facility]  [address] [place], [address] [place], [place] [address] [phone]  5:18 PM  Discussed with nursing they are planning to give his medicine now.  Would not response to worsening tachycardia with BB; rather, give additional IV lasix &return  his MRA.  May need PM BMP if this occurs.  Low threshold to uptirage.  Will signout to my evening partners.  Riley Lam, MD FASE [facility] Cardiologist [facility]  [facility]  [address]  [address] [place], [place] [address] [phone]  5:43 PM

## 2023-10-16 NOTE — Progress Notes (Signed)
 Lower extremity venous duplex completed. Please see CV Procedures for preliminary results.  Shona Simpson, RVT 10/16/23 11:50 AM

## 2023-10-16 NOTE — ED Notes (Signed)
 Pt keeps taking off HFNC. Patient does not report sob.

## 2023-10-16 NOTE — ED Notes (Signed)
 CCMD notified. Pt is on monitor.

## 2023-10-17 DIAGNOSIS — J9601 Acute respiratory failure with hypoxia: Secondary | ICD-10-CM | POA: Diagnosis not present

## 2023-10-17 LAB — BASIC METABOLIC PANEL WITH GFR
Anion gap: 12 (ref 5–15)
BUN: 10 mg/dL (ref 6–20)
CO2: 19 mmol/L — ABNORMAL LOW (ref 22–32)
Calcium: 9.2 mg/dL (ref 8.9–10.3)
Chloride: 106 mmol/L (ref 98–111)
Creatinine, Ser: 1.18 mg/dL (ref 0.61–1.24)
GFR, Estimated: >60 mL/min (ref >60)
Glucose, Bld: 127 mg/dL — ABNORMAL HIGH (ref 70–99)
Potassium: 4.2 mmol/L (ref 3.5–5.1)
Sodium: 137 mmol/L (ref 135–145)

## 2023-10-17 LAB — HEPATIC FUNCTION PANEL
ALT: 32 U/L (ref 0–44)
AST: 40 U/L (ref 15–41)
Albumin: 3.4 g/dL — ABNORMAL LOW (ref 3.5–5.0)
Alkaline Phosphatase: 80 U/L (ref 38–126)
Bilirubin, Direct: 0.3 mg/dL — ABNORMAL HIGH (ref 0.0–0.2)
Indirect Bilirubin: 0.7 mg/dL (ref 0.3–0.9)
Total Bilirubin: 1 mg/dL (ref 0.0–1.2)
Total Protein: 7.4 g/dL (ref 6.5–8.1)

## 2023-10-17 LAB — D-DIMER, QUANTITATIVE: D-Dimer, Quant: 2.84 ug{FEU}/mL — ABNORMAL HIGH (ref 0.00–0.50)

## 2023-10-17 LAB — BRAIN NATRIURETIC PEPTIDE: B Natriuretic Peptide: 138.8 pg/mL — ABNORMAL HIGH (ref 0.0–100.0)

## 2023-10-17 MED ORDER — SACUBITRIL-VALSARTAN 97-103 MG PO TABS
1.0000 | ORAL_TABLET | Freq: Two times a day (BID) | ORAL | Status: DC
  Administered 2023-10-17 – 2023-10-19 (×4): 1 via ORAL
  Filled 2023-10-17 (×4): qty 1

## 2023-10-17 MED ORDER — SACUBITRIL-VALSARTAN 49-51 MG PO TABS
1.0000 | ORAL_TABLET | Freq: Two times a day (BID) | ORAL | Status: DC
  Filled 2023-10-17: qty 1

## 2023-10-17 MED ORDER — FUROSEMIDE 10 MG/ML IJ SOLN
80.0000 mg | Freq: Two times a day (BID) | INTRAMUSCULAR | Status: DC
  Administered 2023-10-17 – 2023-10-18 (×3): 80 mg via INTRAVENOUS
  Filled 2023-10-17 (×3): qty 8

## 2023-10-17 NOTE — Plan of Care (Signed)
   Problem: Education: Goal: Knowledge of General Education information will improve Description: Including pain rating scale, medication(s)/side effects and non-pharmacologic comfort measures Outcome: Progressing   Problem: Health Behavior/Discharge Planning: Goal: Ability to manage health-related needs will improve Outcome: Progressing   Problem: Clinical Measurements: Goal: Will remain free from infection Outcome: Progressing

## 2023-10-17 NOTE — Progress Notes (Signed)
 Progress Note  Patient Name: Micheal Leonard Date of Encounter: 10/17/2023 Primary Cardiologist: [facility]   Subjective   Overnight he had diuresed -4 L and heart rates improved to 107.  Still has SOB but feels better.  Has atypical arm pain.   Vital Signs    Vitals:   10/17/23 0427 10/17/23 0700 10/17/23 0803 10/17/23 1040  BP:  (!) 150/107  (!) 155/114  Pulse:   (!) 107   Resp:   16   Temp:   97.7 F (36.5 C)   TempSrc:   Oral   SpO2:      Weight: 92.4 kg       Intake/Output Summary (Last 24 hours) at 10/17/2023 1122 Last data filed at 10/17/2023 0803 Gross per 24 hour  Intake 468 ml  Output 4550 ml  Net -4082 ml   Filed Weights   10/17/23 0427  Weight: 92.4 kg    Physical Exam   GEN: No acute distress.   Neck: JVD Cardiac: ,Regular tachycardia with rates 107 and continues heart murmurs Respiratory: Clear to auscultation bilaterally. Sat 91% on RA GI: Soft, nontender, non-distended  MS: No edema  Labs   Telemetry: sinus tachycardia   Chemistry Recent Labs  Lab 10/15/23 2040 10/16/23 0532 10/17/23 0411  NA 138 136  --   K 3.6 3.7  --   CL 110 110  --   CO2 18* 17*  --   GLUCOSE 109* 106*  --   BUN 10 10  --   CREATININE 1.29* 1.12  --   CALCIUM 8.8* 8.3*  --   PROT 7.4 7.1 7.4  ALBUMIN 3.7 3.3* 3.4*  AST 45* 36 40  ALT 34 28 32  ALKPHOS 89 74 80  BILITOT 1.2 1.6* 1.0  GFRNONAA >60 >60  --   ANIONGAP 10 9  --      Hematology Recent Labs  Lab 10/15/23 2040 10/16/23 0532  WBC 8.3 6.0  RBC 5.65 5.29  HGB 16.4 15.6  HCT 49.7 46.2  MCV 88.0 87.3  MCH 29.0 29.5  MCHC 33.0 33.8  RDW 14.1 14.1  PLT 255 228    Cardiac EnzymesNo results for input(s): "TROPONINI" in the last 168 hours. No results for input(s): "TROPIPOC" in the last 168 hours.   BNP Recent Labs  Lab 10/15/23 2040 10/16/23 0532 10/17/23 0411  BNP 361.3* 374.3* 138.8*     DDimer No results for input(s): "DDIMER" in the last 168 hours.   Cardiac Studies   Cardiac  Studies & Procedures   ______________________________________________________________________________________________     ECHOCARDIOGRAM  ECHOCARDIOGRAM COMPLETE 10/16/2023  Narrative ECHOCARDIOGRAM REPORT    Patient Name:   Micheal Leonard Date of Exam: 10/16/2023 Medical Rec #:  [id number]        Height:       73.0 in Accession #:    [id number]       Weight:       215.0 lb Date of Birth:  [birth year]        BSA:          2.219 m Patient Age:    27 years         BP:           169/114 mmHg Patient Gender: M                HR:           113 bpm. Exam Location:  Inpatient  Procedure: 2D Echo, Color Doppler and  Cardiac Doppler (Both Spectral and Color Flow Doppler were utilized during procedure).  Indications:    Elevated Troponins  History:        Patient has prior history of Echocardiogram examinations, most recent 04/03/2023. CHF, Signs/Symptoms:HIV; Risk Factors:Hypertension. 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  Sonographer:    Irving Burton Senior RDCS Referring Phys: [id number] Angie Fava   Sonographer Comments: Definity not used due to known right to left shunting IMPRESSIONS   1. Single ventricle morphology- the LV is the systemic ventricle. Left ventricular ejection fraction, by estimation, is 25 to 30%. Left ventricular ejection fraction by PLAX is 26 %. The left ventricle has severely decreased function. The left ventricle demonstrates global hypokinesis. The left ventricular internal cavity size was severely dilated. Left ventricular diastolic parameters are indeterminate. 2. Flow across the VSD and ASD are both unrestricted. Evidence of atrial level shunting detected by color flow Doppler. 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 3. 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 4. The right atrium is very small consistent with Fontan physiology. 5. The mitral valve is abnormal. Severe mitral valve regurgitation. 6. 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 7. The aortic valve is tricuspid. Aortic valve regurgitation is trivial. 8. IVC is directed into an extracardiac Fontan which has low velocity respirophasic flow indicative of normal Fontan physiology. The SVC is directed into a bidirectional Micheal Leonard shunt which has low velocity respirophasic flow indicative of normal Glenn physiology. The inferior vena cava is normal in size with <50% respiratory variability, suggesting right atrial pressure of 8 mmHg.  Comparison(s): Prior images unable to be directly viewed, comparison made by report only. Changes from prior study are noted. 04/03/2023 ([facility]): LVEF 25%, mild to moderate MR.  FINDINGS Left Ventricle: Single ventricle morphology- the LV is the systemic ventricle. Left ventricular ejection fraction, by estimation, is 25 to 30%. Left ventricular ejection fraction by PLAX is 26 %. The left ventricle has severely decreased function. The left ventricle demonstrates global hypokinesis. The left ventricular internal cavity size was severely dilated. There is no left ventricular hypertrophy. Left ventricular diastolic parameters are indeterminate.  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  Left Atrium: Left atrial size was normal in size.  Right Atrium: The right atrium is very small consistent with Fontan physiology.  Pericardium: There is no evidence of pericardial effusion.  Mitral Valve: The mitral valve is abnormal. Severe mitral valve regurgitation.  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  Aortic Valve: The aortic valve is tricuspid. Aortic valve regurgitation is trivial.  Pulmonic Valve: The pulmonic valve was grossly normal. Pulmonic valve regurgitation is not visualized.  Aorta: The aortic root  and ascending aorta are structurally normal, with no evidence of dilitation.  Venous: IVC is directed into an extracardiac Fontan which has low velocity respirophasic flow indicative of normal Fontan physiology. The SVC is directed into a bidirectional Micheal Leonard shunt which has low velocity respirophasic flow indicative of normal Glenn physiology. The inferior vena cava is normal in size with less than 50% respiratory variability, suggesting right atrial pressure of 8 mmHg.  IAS/Shunts: Evidence of atrial level shunting detected by color flow Doppler. There is a small membranous (infracristal) ventricular septal defect with bidirectional shunting. 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   LEFT VENTRICLE PLAX 2D LV  EF:         Left            Diastology ventricular     LV e' medial:   7.20 cm/s ejection        LV E/e' medial: 7.9 fraction by PLAX is 26 %. LVIDd:         7.30 cm LVIDs:         6.40 cm LV PW:         1.10 cm LV IVS:        1.00 cm LVOT diam:     2.60 cm LV SV:         33 LV SV Index:   15 LVOT Area:     5.31 cm   LEFT ATRIUM             Index LA diam:        3.00 cm 1.35 cm/m LA Vol (A2C):   68.0 ml 30.65 ml/m LA Vol (A4C):   39.0 ml 17.58 ml/m LA Biplane Vol: 53.4 ml 24.07 ml/m AORTIC VALVE LVOT Vmax:   48.50 cm/s LVOT Vmean:  35.050 cm/s LVOT VTI:    0.063 m  AORTA Ao Root diam: 3.40 cm Ao Asc diam:  3.10 cm  MITRAL VALVE MV Area (PHT): 4.33 cm       SHUNTS MV Decel Time: 175 msec       Systemic VTI:  0.06 m MR Peak grad:    92.9 mmHg    Systemic Diam: 2.60 cm MR Mean grad:    48.0 mmHg MR Vmax:         482.00 cm/s MR Vmean:        328.0 cm/s MR PISA:         4.02 cm MR PISA Eff ROA: 32 mm MR PISA Radius:  0.80 cm MV E velocity: 56.70 cm/s MV A velocity: 34.40 cm/s MV E/A ratio:  1.65  Zoila Shutter MD Electronically signed by Zoila Shutter MD Signature Date/Time: 10/16/2023/2:38:48  PM    Final          ______________________________________________________________________________________________       Assessment & Plan   Congenital Heart Disease - s/p Micheal Leonard and Fontan; Stable albumin, complicated by hx of glomerulonephritis, normal synthetic function ; he has proteinuria on UA;  No evidence of protein losing enteropathy; need outpatient nephrology follow up, SGLT2i resumed  - Acute on chronic combined HF-  I have ordered a BMP today; lasix 80 IV BID, increased ARNI dose Lasix 80 IV BID, if Low K and OK creatinine add back MRA home medication  - Tricuspid atresia, VSD, ASD- not clear that he truly had a PE; d dimer ordered 4/3 not performed; will re-order, he is amenable to wearing O2 again, Cardiac diet and IVF restriction 2 L; concerned about non-adherence PTA  Hx of HIV and query of bronchitis- improving largely with diuretics   For questions or updates, please contact [facility] Please consult [email or web address] for contact info under Cardiology/STEMI.      Riley Lam, MD FASE [facility] Cardiologist [facility]  [address] [place], [address] [place], [place] [address] [phone]  11:22 AM

## 2023-10-17 NOTE — Progress Notes (Signed)
   10/17/23 1356  TOC Brief Assessment  Insurance and Status Reviewed  Patient has primary care physician Yes (Followed at [facility] for cardiology)  Home environment has been reviewed home  Prior level of function: independent  Prior/Current Home Services No current home services  Social Drivers of Health Review SDOH reviewed no interventions necessary  Readmission risk has been reviewed Yes  Transition of care needs no transition of care needs at this time    No anticipated needs, has family support. We will continue to monitor patient advancement through interdisciplinary progression rounds. If new patient transition needs arise, please place a TOC consult.

## 2023-10-17 NOTE — Progress Notes (Signed)
 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  There was no chest pain or palpitations diaphoresis.  No orthopnea PND or edema.  Has history of PE in 2017.  Follows up at [facility] cardiology due to complex procedures.  HIV with recent CD4 count of 330.  He is on bimonthly monthly injection With Cabenuv last dose 3/26. At the emergency room he was afebrile.  Initial heart rate 120.  87% on room air.  Started on 3 L nasal cannula oxygen.  Electrolytes fairly normal.  Creatinine 1.29 slightly worse than usual.  BNP 361.  High sensitive troponins borderline elevated.  COVID, influenza, RSV negative.  Blood cultures were drawn.  CTA showed possible left lower lobe subsegmental PE.  No pneumonia.  Patient was started on oxygen and admitted to the hospital.   Subjective:  Patient seen and examined.  Much improvement.  On room air today.  Going to the bathroom.  Advised to walk in the hallway with nursing staff. Urine output more than 4 L / 24 hours.  Renal functions are stable.  Cough is improving.  Cardiology recommended to continue IV diuresis.   Assessment & Plan:   Acute hypoxemic respiratory failure: Multifactorial. Acute bronchitis with no evidence of bacterial infection. Chronic systolic heart failure with known ejection fraction of 25%.  No evidence of infection.  Procalcitonin less than 0.1.  Symptomatic management with mucolytic's and bronchodilator therapy.  Continue to mobilize.  Keep on oxygen to keep saturation more than 90%. Echocardiogram with ejection fraction 25  to 30% and gross regional wall abnormality consistent with previous surgeries.  Clinically with fluid overload. Cardiology following.  Now started on diuretics.  Remains on IV Lasix 80 mg twice daily.  Intake output monitoring.  Mobilize.  Also started patient back on [place], [place].  Suspected PE: Reported possible subsegmental PE, likely flow defect due to a structural problem.  D-dimer elevated.  Lower extremity duplex is negative.  Decided not to treat.  HIV disease: Documented well-controlled.  On bimonthly injection.  Last dose 3/26.  Essential hypertension: Blood pressure stable.  Resume home medications including Norvasc.   DVT prophylaxis: SCDs Start: 10/15/23 2340   Code Status: Full code Family Communication: None at the bedside Disposition Plan: Status is: Inpatient Remains inpatient appropriate because: Significant new onset hypoxemia.  IV diuresis     Consultants:  Cardiology  Procedures:  None  Antimicrobials:  None     Objective: Vitals:   10/17/23 0700 10/17/23 0803 10/17/23 1040 10/17/23 1124  BP: (!) 150/107 (!) 150/107 (!) 155/114 (!) 158/112  Pulse:  (!) 107  (!) 112  Resp:  16  (!) 25  Temp:  97.7 F (36.5 C)  98 F (36.7 C)  TempSrc:  Oral  Oral  SpO2:  92%  92%  Weight:        Intake/Output Summary (Last 24 hours) at 10/17/2023 1315 Last data filed at 10/17/2023 0803 Gross per 24 hour  Intake 468 ml  Output 4550 ml  Net -4082 ml   Filed Weights   10/17/23 0427  Weight: 92.4 kg  Examination:  General exam: Appears calm and comfortable.  On room air today. Respiratory system: Clear to auscultation. Respiratory effort normal.  No added sounds. Cardiovascular system: S1 & S2 heard, RRR. No JVD, patient has pansystolic murmur radiating to the axilla. Gastrointestinal system: Abdomen is nondistended, soft and nontender. No organomegaly or masses felt. Normal bowel sounds heard. Central nervous system: Alert and oriented. No focal  neurological deficits. Extremities: Symmetric 5 x 5 power. Skin: No rashes, lesions or ulcers Psychiatry: Judgement and insight appear normal. Mood & affect appropriate.     Data Reviewed: I have personally reviewed following labs and imaging studies  CBC: Recent Labs  Lab 10/15/23 2040 10/16/23 0532  WBC 8.3 6.0  NEUTROABS 5.5 3.7  HGB 16.4 15.6  HCT 49.7 46.2  MCV 88.0 87.3  PLT 255 228   Basic Metabolic Panel: Recent Labs  Lab 10/15/23 2040 10/15/23 2321 10/16/23 0532 10/17/23 1122  NA 138  --  136 137  K 3.6  --  3.7 4.2  CL 110  --  110 106  CO2 18*  --  17* 19*  GLUCOSE 109*  --  106* 127*  BUN 10  --  10 10  CREATININE 1.29*  --  1.12 1.18  CALCIUM 8.8*  --  8.3* 9.2  MG  --  1.9 2.0  --   PHOS  --   --  3.1  --    GFR: CrCl cannot be calculated (Unknown ideal weight.). Liver Function Tests: Recent Labs  Lab 10/15/23 2040 10/16/23 0532 10/17/23 0411  AST 45* 36 40  ALT 34 28 32  ALKPHOS 89 74 80  BILITOT 1.2 1.6* 1.0  PROT 7.4 7.1 7.4  ALBUMIN 3.7 3.3* 3.4*   No results for input(s): "LIPASE", "AMYLASE" in the last 168 hours. No results for input(s): "AMMONIA" in the last 168 hours. Coagulation Profile: Recent Labs  Lab 10/15/23 2052  INR 1.2   Cardiac Enzymes: Recent Labs  Lab 10/15/23 2030  CKTOTAL 598*   BNP (last 3 results) No results for input(s): "PROBNP" in the last 8760 hours. HbA1C: No results for input(s): "HGBA1C" in the last 72 hours. CBG: No results for input(s): "GLUCAP" in the last 168 hours. Lipid Profile: No results for input(s): "CHOL", "HDL", "LDLCALC", "TRIG", "CHOLHDL", "LDLDIRECT" in the last 72 hours. Thyroid Function Tests: No results for input(s): "TSH", "T4TOTAL", "FREET4", "T3FREE", "THYROIDAB" in the last 72 hours. Anemia Panel: No results for input(s): "VITAMINB12", "FOLATE", "FERRITIN", "TIBC", "IRON", "RETICCTPCT" in the last 72 hours. Sepsis Labs: Recent Labs  Lab 10/15/23 2114 10/15/23 2321   PROCALCITON  --  <0.10  LATICACIDVEN 1.0  --     Recent Results (from the past 240 hours)  Resp panel by RT-PCR (RSV, Flu A&B, Covid) Anterior Nasal Swab     Status: None   Collection Time: 10/15/23  8:53 PM   Specimen: Anterior Nasal Swab  Result Value Ref Range Status   SARS Coronavirus 2 by RT PCR NEGATIVE NEGATIVE Final   Influenza A by PCR NEGATIVE NEGATIVE Final   Influenza B by PCR NEGATIVE NEGATIVE Final    Comment: (NOTE) The Xpert Xpress SARS-CoV-2/FLU/RSV plus assay is intended as an aid in the diagnosis of influenza from Nasopharyngeal swab specimens and should not be used as a sole basis for treatment. Nasal washings and aspirates are unacceptable for Xpert Xpress SARS-CoV-2/FLU/RSV testing.  Fact Sheet for Patients: BloggerCourse.com  Fact Sheet for Healthcare Providers: SeriousBroker.it  This test is not yet approved  or cleared by the [place] and has been authorized for detection and/or diagnosis of SARS-CoV-2 by FDA under an Emergency Use Authorization (EUA). This EUA will remain in effect (meaning this test can be used) for the duration of the COVID-19 declaration under Section 564(b)(1) of the Act, 21 U.S.C. section 360bbb-3(b)(1), unless the authorization is terminated or revoked.     Resp Syncytial Virus by PCR NEGATIVE NEGATIVE Final    Comment: (NOTE) Fact Sheet for Patients: BloggerCourse.com  Fact Sheet for Healthcare Providers: SeriousBroker.it  This test is not yet approved or cleared by the [place] FDA and has been authorized for detection and/or diagnosis of SARS-CoV-2 by FDA under an Emergency Use Authorization (EUA). This EUA will remain in effect (meaning this test can be used) for the duration of the COVID-19 declaration under Section 564(b)(1) of the Act, 21 U.S.C. section 360bbb-3(b)(1), unless the authorization is  terminated or revoked.  Performed at [facility] [place] [facility], [address], [place], [place] [address]   Blood Culture (routine x 2)     Status: None (Preliminary result)   Collection Time: 10/15/23  9:00 PM   Specimen: BLOOD  Result Value Ref Range Status   Specimen Description BLOOD RIGHT ANTECUBITAL  Final   Special Requests   Final    BOTTLES DRAWN AEROBIC AND ANAEROBIC Blood Culture adequate volume   Culture   Final    NO GROWTH 2 DAYS Performed at [facility], [address], [place], [place] [address]    Report Status PENDING  Incomplete  Blood Culture (routine x 2)     Status: None (Preliminary result)   Collection Time: 10/15/23  9:05 PM   Specimen: BLOOD LEFT FOREARM  Result Value Ref Range Status   Specimen Description BLOOD LEFT FOREARM  Final   Special Requests   Final    BOTTLES DRAWN AEROBIC AND ANAEROBIC Blood Culture adequate volume   Culture   Final    NO GROWTH 2 DAYS Performed at [facility], [address], [place], [place] [address]    Report Status PENDING  Incomplete         Radiology Studies: VAS [place] LOWER EXTREMITY VENOUS (DVT) Result Date: 10/16/2023  Lower Venous DVT Study Patient Name:  Micheal Leonard  Date of Exam:   10/16/2023 Medical Rec #: [id number]         Accession #:    [id number] Date of Birth: 06-12-1996         Patient Gender: M Patient Age:   89 years Exam Location:  [facility] Procedure:      VAS [place] LOWER EXTREMITY VENOUS (DVT) Referring Phys: Newton Pigg --------------------------------------------------------------------------------  Indications: Pain.  Risk Factors: Confirmed PE. Comparison Study: None. Performing Technologist: Shona Simpson  Examination Guidelines: A complete evaluation includes B-mode imaging, spectral Doppler, color Doppler, and power Doppler as needed of all accessible portions of each vessel. Bilateral testing is considered an integral part of a complete examination. Limited examinations for  reoccurring indications may be performed as noted. The reflux portion of the exam is performed with the patient in reverse Trendelenburg.  +---------+---------------+---------+-----------+----------+--------------+ RIGHT    CompressibilityPhasicitySpontaneityPropertiesThrombus Aging +---------+---------------+---------+-----------+----------+--------------+ CFV      Full           Yes      Yes                                 +---------+---------------+---------+-----------+----------+--------------+  SFJ      Full                                                        +---------+---------------+---------+-----------+----------+--------------+ FV Prox  Full                                                        +---------+---------------+---------+-----------+----------+--------------+ FV Mid   Full                                                        +---------+---------------+---------+-----------+----------+--------------+ FV DistalFull                                                        +---------+---------------+---------+-----------+----------+--------------+ PFV      Full                                                        +---------+---------------+---------+-----------+----------+--------------+ POP      Full           Yes      Yes                                 +---------+---------------+---------+-----------+----------+--------------+ PTV      Full                    Yes                                 +---------+---------------+---------+-----------+----------+--------------+ PERO     Full                    Yes                                 +---------+---------------+---------+-----------+----------+--------------+   +---------+---------------+---------+-----------+----------+--------------+ LEFT     CompressibilityPhasicitySpontaneityPropertiesThrombus Aging  +---------+---------------+---------+-----------+----------+--------------+ CFV      Full           Yes      Yes                                 +---------+---------------+---------+-----------+----------+--------------+ SFJ      Full                                                        +---------+---------------+---------+-----------+----------+--------------+  FV Prox  Full                                                        +---------+---------------+---------+-----------+----------+--------------+ FV Mid   Full                                                        +---------+---------------+---------+-----------+----------+--------------+ FV DistalFull                                                        +---------+---------------+---------+-----------+----------+--------------+ PFV      Full                                                        +---------+---------------+---------+-----------+----------+--------------+ POP      Full           Yes      Yes                                 +---------+---------------+---------+-----------+----------+--------------+ PTV      Full                    Yes                                 +---------+---------------+---------+-----------+----------+--------------+ PERO     Full                    Yes                                 +---------+---------------+---------+-----------+----------+--------------+     Summary: BILATERAL: - No evidence of deep vein thrombosis seen in the lower extremities, bilaterally. -No evidence of popliteal cyst, bilaterally.   *See table(s) above for measurements and observations. Electronically signed by Heath Lark on 10/16/2023 at 4:15:05 PM.    Final    ECHOCARDIOGRAM COMPLETE Result Date: 10/16/2023    ECHOCARDIOGRAM REPORT   Patient Name:   SALATHIEL FERRARA Date of Exam: 10/16/2023 Medical Rec #:  [id number]        Height:       73.0 in Accession #:    [id number]        Weight:       215.0 lb Date of Birth:  September 18, 1995        BSA:          2.219 m Patient Age:    27 years         BP:           169/114 mmHg Patient Gender: M  HR:           113 bpm. Exam Location:  Inpatient Procedure: 2D Echo, Color Doppler and Cardiac Doppler (Both Spectral and Color            Flow Doppler were utilized during procedure). Indications:    Elevated Troponins  History:        Patient has prior history of Echocardiogram examinations, most                 recent 04/03/2023. CHF, Signs/Symptoms:HIV; Risk                 Factors:Hypertension. 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  Sonographer:    Irving Burton Senior RDCS Referring Phys: [id number] Angie Fava  Sonographer Comments: Definity not used due to known right to left shunting IMPRESSIONS  1. Single ventricle morphology- the LV is the systemic ventricle. Left ventricular ejection fraction, by estimation, is 25 to 30%. Left ventricular ejection fraction by PLAX is 26 %. The left ventricle has severely decreased function. The left ventricle  demonstrates global hypokinesis. The left ventricular internal cavity size was severely dilated. Left ventricular diastolic parameters are indeterminate.  2. Flow across the VSD and ASD are both unrestricted. Evidence of atrial level shunting detected by color flow Doppler. 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  3. 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  4. The right atrium is very small consistent with Fontan physiology.  5. The mitral valve is abnormal. Severe mitral valve regurgitation.  6. 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  7. The aortic valve is tricuspid. Aortic valve regurgitation is trivial.  8. IVC is directed into an extracardiac  Fontan which has low velocity respirophasic flow indicative of normal Fontan physiology.     The SVC is directed into a bidirectional [name] shunt which has low velocity respirophasic flow indicative of normal Glenn physiology. The inferior vena cava is normal in size with <50% respiratory variability, suggesting right atrial pressure of 8 mmHg. Comparison(s): Prior images unable to be directly viewed, comparison made by report only. Changes from prior study are noted. 04/03/2023 ([facility]): LVEF 25%, mild to moderate MR. FINDINGS  Left Ventricle: Single ventricle morphology- the LV is the systemic ventricle. Left ventricular ejection fraction, by estimation, is 25 to 30%. Left ventricular ejection fraction by PLAX is 26 %. The left ventricle has severely decreased function. The left ventricle demonstrates global hypokinesis. The left ventricular internal cavity size was severely dilated. There is no left ventricular hypertrophy. Left ventricular diastolic parameters are indeterminate. 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 Left Atrium: Left atrial size was normal in size. Right Atrium: The right atrium is very small consistent with Fontan physiology. Pericardium: There is no evidence of pericardial effusion. Mitral Valve: The mitral valve is abnormal. Severe mitral valve regurgitation. 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 Aortic Valve: The aortic valve is tricuspid. Aortic valve regurgitation is trivial. Pulmonic Valve: The pulmonic valve was grossly normal. Pulmonic valve regurgitation is not visualized. Aorta: The aortic root and ascending aorta are structurally normal, with no evidence of dilitation. Venous: IVC is directed into an extracardiac Fontan which  has low velocity respirophasic flow indicative of normal Fontan physiology. The SVC is directed into a bidirectional [name] shunt which has low velocity respirophasic flow indicative of normal Glenn physiology. The  inferior vena cava is normal in size with less than 50% respiratory variability, suggesting right atrial pressure of  8 mmHg. IAS/Shunts: Evidence of atrial level shunting detected by color flow Doppler. There is a small membranous (infracristal) ventricular septal defect with bidirectional shunting. 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  LEFT VENTRICLE PLAX 2D LV EF:         Left            Diastology                ventricular     LV e' medial:   7.20 cm/s                ejection        LV E/e' medial: 7.9                fraction by                PLAX is 26                %. LVIDd:         7.30 cm LVIDs:         6.40 cm LV PW:         1.10 cm LV IVS:        1.00 cm LVOT diam:     2.60 cm LV SV:         33 LV SV Index:   15 LVOT Area:     5.31 cm  LEFT ATRIUM             Index LA diam:        3.00 cm 1.35 cm/m LA Vol (A2C):   68.0 ml 30.65 ml/m LA Vol (A4C):   39.0 ml 17.58 ml/m LA Biplane Vol: 53.4 ml 24.07 ml/m  AORTIC VALVE LVOT Vmax:   48.50 cm/s LVOT Vmean:  35.050 cm/s LVOT VTI:    0.063 m  AORTA Ao Root diam: 3.40 cm Ao Asc diam:  3.10 cm MITRAL VALVE MV Area (PHT): 4.33 cm       SHUNTS MV Decel Time: 175 msec       Systemic VTI:  0.06 m MR Peak grad:    92.9 mmHg    Systemic Diam: 2.60 cm MR Mean grad:    48.0 mmHg MR Vmax:         482.00 cm/s MR Vmean:        328.0 cm/s MR PISA:         4.02 cm MR PISA Eff ROA: 32 mm MR PISA Radius:  0.80 cm MV E velocity: 56.70 cm/s MV A velocity: 34.40 cm/s MV E/A ratio:  1.65 Zoila Shutter MD Electronically signed by Zoila Shutter MD Signature Date/Time: 10/16/2023/2:38:48 PM    Final    DG Chest Port 1 View Result Date: 10/16/2023 CLINICAL DATA:  Hypoxic seal, tachycardia EXAM: PORTABLE CHEST 1 VIEW COMPARISON:  10/15/2023 FINDINGS: Lungs are clear. No pneumothorax or pleural effusion. Stable cardiomegaly. Embolization plugs are seen within the left perihilar region, unchanged. Pulmonary vascularity is normal. No  acute bone abnormality. IMPRESSION: 1. No active disease. 2. Cardiomegaly. Electronically Signed   By: Helyn Numbers M.D.   On: 10/16/2023 04:12   CT Angio  Chest PE W and/or Wo Contrast Result Date: 10/15/2023 CLINICAL DATA:  Pulmonary embolism (PE) suspected, high prob. intermittent right side body numbness onset approx. 6 am this morning with mild nausea, speech clear with no facial asymmetry , alert and oriented /ambulatory . History of tricuspid atresia and Fontan procedure. EXAM: CT ANGIOGRAPHY CHEST WITH CONTRAST TECHNIQUE: Multidetector CT imaging of the chest was performed using the standard protocol during bolus administration of intravenous contrast. Multiplanar CT image reconstructions and MIPs were obtained to evaluate the vascular anatomy. RADIATION DOSE REDUCTION: This exam was performed according to the departmental dose-optimization program which includes automated exposure control, adjustment of the mA and/or kV according to patient size and/or use of iterative reconstruction technique. CONTRAST:  75mL OMNIPAQUE IOHEXOL 350 MG/ML SOLN COMPARISON:  CT angio chest 10/23/2016 FINDINGS: Cardiovascular: Prior Fontan procedure surgical changes noted. Satisfactory opacification of the pulmonary arteries to the segmental level. Likely left lower lobe subsegmental pulmonary embolus (7:294). Enlarged right heart size consistent with history of tricuspid atresia. No pericardial effusion. Mediastinum/Nodes: No enlarged mediastinal, hilar, or axillary lymph nodes. Thyroid gland, trachea, and esophagus demonstrate no significant findings. Lungs/Pleura: No focal consolidation. Stable right upper lobe 5 x 4 mm pulmonary nodule. No pulmonary mass. No pleural effusion. No pneumothorax. Upper Abdomen: No acute abnormality. Musculoskeletal: No chest wall abnormality.  Bilateral gynecomastia. No suspicious lytic or blastic osseous lesions. No acute displaced fracture. Multilevel degenerative changes of the spine.  Review of the MIP images confirms the above findings. IMPRESSION: 1. Likely left lower lobe subsegmental pulmonary embolus with no associated pulmonary infarction or right heart strain. 2. Fontan procedure surgical changes. Electronically Signed   By: Tish Frederickson M.D.   On: 10/15/2023 22:39   DG Chest Port 1 View Result Date: 10/15/2023 CLINICAL DATA:  Cough and hypoxia EXAM: PORTABLE CHEST 1 VIEW COMPARISON:  10/22/2016 FINDINGS: Increased size of the cardiomediastinal silhouette compared to 10/22/2016. Cardiac and mediastinal closure devices. Pulmonary vascular congestion. No focal consolidation, pleural effusion, or pneumothorax. No displaced rib fractures. IMPRESSION: 1. Increased size of the cardiomediastinal silhouette compared to 10/22/2016. This may be due to hypertrophy or pericardial effusion. Recommend correlation with echocardiogram. 2. Pulmonary vascular congestion. Electronically Signed   By: Minerva Fester M.D.   On: 10/15/2023 21:36        Scheduled Meds:  amLODipine  5 mg Oral Daily   aspirin EC  81 mg Oral Daily   empagliflozin  10 mg Oral Daily   furosemide  80 mg Intravenous BID   loratadine  10 mg Oral Daily   sacubitril-valsartan  1 tablet Oral BID   Continuous Infusions:   LOS: 2 days    Time spent: 40 minutes    Dorcas Carrow, MD [facility]

## 2023-10-18 DIAGNOSIS — J9601 Acute respiratory failure with hypoxia: Secondary | ICD-10-CM | POA: Diagnosis not present

## 2023-10-18 DIAGNOSIS — I5022 Chronic systolic (congestive) heart failure: Secondary | ICD-10-CM | POA: Diagnosis not present

## 2023-10-18 DIAGNOSIS — Z21 Asymptomatic human immunodeficiency virus [HIV] infection status: Secondary | ICD-10-CM | POA: Diagnosis not present

## 2023-10-18 LAB — BASIC METABOLIC PANEL WITH GFR
Anion gap: 12 (ref 5–15)
BUN: 14 mg/dL (ref 6–20)
CO2: 21 mmol/L — ABNORMAL LOW (ref 22–32)
Calcium: 9.1 mg/dL (ref 8.9–10.3)
Chloride: 102 mmol/L (ref 98–111)
Creatinine, Ser: 1.43 mg/dL — ABNORMAL HIGH (ref 0.61–1.24)
GFR, Estimated: >60 mL/min (ref >60)
Glucose, Bld: 131 mg/dL — ABNORMAL HIGH (ref 70–99)
Potassium: 3.7 mmol/L (ref 3.5–5.1)
Sodium: 135 mmol/L (ref 135–145)

## 2023-10-18 LAB — HEPATIC FUNCTION PANEL
ALT: 35 U/L (ref 0–44)
AST: 55 U/L — ABNORMAL HIGH (ref 15–41)
Albumin: 3.6 g/dL (ref 3.5–5.0)
Alkaline Phosphatase: 88 U/L (ref 38–126)
Bilirubin, Direct: 0.3 mg/dL — ABNORMAL HIGH (ref 0.0–0.2)
Indirect Bilirubin: 0.9 mg/dL (ref 0.3–0.9)
Total Bilirubin: 1.2 mg/dL (ref 0.0–1.2)
Total Protein: 8.1 g/dL (ref 6.5–8.1)

## 2023-10-18 NOTE — Progress Notes (Signed)
 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  There was no chest pain or palpitations diaphoresis.  No orthopnea PND or edema.  Has history of PE in 2017.  Follows up at [facility] cardiology due to complex procedures.  HIV with recent CD4 count of 330.  He is on bimonthly monthly injection With Cabenuv last dose 3/26. At the emergency room he was afebrile.  Initial heart rate 120.  87% on room air.  Started on 3 L nasal cannula oxygen.  Electrolytes fairly normal.  Creatinine 1.29 slightly worse than usual.  BNP 361.  High sensitive troponins borderline elevated.  COVID, influenza, RSV negative.  Blood cultures were drawn.  CTA showed possible left lower lobe subsegmental PE.  No pneumonia.  Patient was started on oxygen and admitted to the hospital.   Subjective: Patient lying flat on the bed.  Denies any shortness of breath or chest pain this morning.  No nausea or vomiting.   Assessment & Plan:   Acute hypoxemic respiratory failure: Multifactorial. Acute bronchitis with no evidence of bacterial infection. No evidence of infection.  Procalcitonin less than 0.1.  Symptomatic management with mucolytic's and bronchodilator therapy.   Has been weaned off of oxygen.  Mobilize.    Acute on chronic systolic heart failure with known ejection fraction of 25%. Followed by cardiology.  Noted to be on IV furosemide.  Also noted to be on Entresto. Has a history of congenital heart disease and followed at [facility]. Seems to be hemodynamically stable. Await to metabolic panel from this  morning.  Suspected PE: In the setting of his congenital heart disease and procedures he has undergone previously CT evaluation of PE is very difficult and VQ scan is unreliable. Lower extremity Doppler studies were negative for DVT. Patient not started on anticoagulation.  D-dimer noted to be elevated however. Patient did present with hypoxemia, tachycardia which appears to have resolved without treatment for this presumed PE. Agree with previous decision to not treat this nonspecific finding at this time.  HIV disease: Documented well-controlled.  On bimonthly injection.  Last dose 3/26.  Essential hypertension: Blood pressure stable.  Resume home medications including Norvasc.   DVT prophylaxis: SCDs Start: 10/15/23 2340 Code Status: Full code Family Communication: None at the bedside Disposition Plan: Return home when cleared by cardiology   Consultants:  Cardiology  Procedures:  None  Antimicrobials:  None     Objective: Vitals:   10/17/23 1636 10/17/23 1953 10/17/23 2353 10/18/23 0800  BP: (!) 157/102 126/78 119/78 119/71  Pulse: (!) 113 (!) 109    Resp: 20 18 20    Temp: 97.9 F (36.6 C) 97.9 F (36.6 C) (!) 97.5 F (36.4 C) 97.6 F (36.4 C)  TempSrc: Oral Oral Oral Axillary  SpO2:  93%  90%  Weight:        Intake/Output Summary (Last 24 hours) at 10/18/2023 1110 Last data filed at 10/18/2023 0412 Gross per 24 hour  Intake 720 ml  Output 2150 ml  Net -1430 ml   Filed Weights   10/17/23 0427  Weight: 92.4 kg  Examination:  General appearance: Awake alert.  In no distress Resp: Clear to auscultation bilaterally.  Normal effort Cardio: S1-S2 is normal regular.  No S3-S4.  No rubs murmurs or bruit GI: Abdomen is soft.  Nontender nondistended.  Bowel sounds are present normal.  No masses organomegaly Extremities: No edema.  Full range of motion of lower extremities. Neurologic: Alert and oriented x3.  No focal neurological deficits.     Data  Reviewed: I have personally reviewed following labs and imaging studies  CBC: Recent Labs  Lab 10/15/23 2040 10/16/23 0532  WBC 8.3 6.0  NEUTROABS 5.5 3.7  HGB 16.4 15.6  HCT 49.7 46.2  MCV 88.0 87.3  PLT 255 228   Basic Metabolic Panel: Recent Labs  Lab 10/15/23 2040 10/15/23 2321 10/16/23 0532 10/17/23 1122  NA 138  --  136 137  K 3.6  --  3.7 4.2  CL 110  --  110 106  CO2 18*  --  17* 19*  GLUCOSE 109*  --  106* 127*  BUN 10  --  10 10  CREATININE 1.29*  --  1.12 1.18  CALCIUM 8.8*  --  8.3* 9.2  MG  --  1.9 2.0  --   PHOS  --   --  3.1  --    GFR: CrCl cannot be calculated (Unknown ideal weight.). Liver Function Tests: Recent Labs  Lab 10/15/23 2040 10/16/23 0532 10/17/23 0411 10/18/23 0315  AST 45* 36 40 55*  ALT 34 28 32 35  ALKPHOS 89 74 80 88  BILITOT 1.2 1.6* 1.0 1.2  PROT 7.4 7.1 7.4 8.1  ALBUMIN 3.7 3.3* 3.4* 3.6    Coagulation Profile: Recent Labs  Lab 10/15/23 2052  INR 1.2   Cardiac Enzymes: Recent Labs  Lab 10/15/23 2030  CKTOTAL 598*   Sepsis Labs: Recent Labs  Lab 10/15/23 2114 10/15/23 2321  PROCALCITON  --  <0.10  LATICACIDVEN 1.0  --     Recent Results (from the past 240 hours)  Resp panel by RT-PCR (RSV, Flu A&B, Covid) Anterior Nasal Swab     Status: None   Collection Time: 10/15/23  8:53 PM   Specimen: Anterior Nasal Swab  Result Value Ref Range Status   SARS Coronavirus 2 by RT PCR NEGATIVE NEGATIVE Final   Influenza A by PCR NEGATIVE NEGATIVE Final   Influenza B by PCR NEGATIVE NEGATIVE Final    Comment: (NOTE) The Xpert Xpress SARS-CoV-2/FLU/RSV plus assay is intended as an aid in the diagnosis of influenza from Nasopharyngeal swab specimens and should not be used as a sole basis for treatment. Nasal washings and aspirates are unacceptable for Xpert Xpress SARS-CoV-2/FLU/RSV testing.  Fact Sheet for Patients: BloggerCourse.com  Fact Sheet for Healthcare  Providers: SeriousBroker.it  This test is not yet approved or cleared by the [place] FDA and has been authorized for detection and/or diagnosis of SARS-CoV-2 by FDA under an Emergency Use Authorization (EUA). This EUA will remain in effect (meaning this test can be used) for the duration of the COVID-19 declaration under Section 564(b)(1) of the Act, 21 U.S.C. section 360bbb-3(b)(1), unless the authorization is terminated or revoked.     Resp Syncytial Virus by PCR NEGATIVE NEGATIVE Final    Comment: (NOTE) Fact Sheet for Patients: BloggerCourse.com  Fact Sheet for Healthcare Providers: SeriousBroker.it  This test is not yet approved or cleared by the [place] FDA and has been authorized for detection and/or diagnosis of SARS-CoV-2 by FDA under an Emergency Use  Authorization (EUA). This EUA will remain in effect (meaning this test can be used) for the duration of the COVID-19 declaration under Section 564(b)(1) of the Act, 21 U.S.C. section 360bbb-3(b)(1), unless the authorization is terminated or revoked.  Performed at [facility], [address] [place], [place] [address]   Blood Culture (routine x 2)     Status: None (Preliminary result)   Collection Time: 10/15/23  9:00 PM   Specimen: BLOOD  Result Value Ref Range Status   Specimen Description BLOOD RIGHT ANTECUBITAL  Final   Special Requests   Final    BOTTLES DRAWN AEROBIC AND ANAEROBIC Blood Culture adequate volume   Culture   Final    NO GROWTH 3 DAYS Performed at [facility], [address] [place], [place] [address]    Report Status PENDING  Incomplete  Blood Culture (routine x 2)     Status: None (Preliminary result)   Collection Time: 10/15/23  9:05 PM   Specimen: BLOOD LEFT FOREARM  Result Value Ref Range Status   Specimen Description BLOOD LEFT FOREARM  Final   Special Requests   Final    BOTTLES DRAWN  AEROBIC AND ANAEROBIC Blood Culture adequate volume   Culture   Final    NO GROWTH 3 DAYS Performed at [facility], [address], [place], [place] [address]    Report Status PENDING  Incomplete         Radiology Studies: VAS [place] LOWER EXTREMITY VENOUS (DVT) Result Date: 10/16/2023  Lower Venous DVT Study Patient Name:  Micheal Leonard  Date of Exam:   10/16/2023 Medical Rec #: [id number]         Accession #:    [id number] Date of Birth: 03-21-1996         Patient Gender: M Patient Age:   39 years Exam Location:  [facility] Procedure:      VAS [place] LOWER EXTREMITY VENOUS (DVT) Referring Phys: Newton Pigg --------------------------------------------------------------------------------  Indications: Pain.  Risk Factors: Confirmed PE. Comparison Study: None. Performing Technologist: Shona Simpson  Examination Guidelines: A complete evaluation includes B-mode imaging, spectral Doppler, color Doppler, and power Doppler as needed of all accessible portions of each vessel. Bilateral testing is considered an integral part of a complete examination. Limited examinations for reoccurring indications may be performed as noted. The reflux portion of the exam is performed with the patient in reverse Trendelenburg.  +---------+---------------+---------+-----------+----------+--------------+ RIGHT    CompressibilityPhasicitySpontaneityPropertiesThrombus Aging +---------+---------------+---------+-----------+----------+--------------+ CFV      Full           Yes      Yes                                 +---------+---------------+---------+-----------+----------+--------------+ SFJ      Full                                                        +---------+---------------+---------+-----------+----------+--------------+ FV Prox  Full                                                        +---------+---------------+---------+-----------+----------+--------------+  FV Mid   Full                                                         +---------+---------------+---------+-----------+----------+--------------+ FV DistalFull                                                        +---------+---------------+---------+-----------+----------+--------------+ PFV      Full                                                        +---------+---------------+---------+-----------+----------+--------------+ POP      Full           Yes      Yes                                 +---------+---------------+---------+-----------+----------+--------------+ PTV      Full                    Yes                                 +---------+---------------+---------+-----------+----------+--------------+ PERO     Full                    Yes                                 +---------+---------------+---------+-----------+----------+--------------+   +---------+---------------+---------+-----------+----------+--------------+ LEFT     CompressibilityPhasicitySpontaneityPropertiesThrombus Aging +---------+---------------+---------+-----------+----------+--------------+ CFV      Full           Yes      Yes                                 +---------+---------------+---------+-----------+----------+--------------+ SFJ      Full                                                        +---------+---------------+---------+-----------+----------+--------------+ FV Prox  Full                                                        +---------+---------------+---------+-----------+----------+--------------+ FV Mid   Full                                                        +---------+---------------+---------+-----------+----------+--------------+  FV DistalFull                                                        +---------+---------------+---------+-----------+----------+--------------+ PFV      Full                                                         +---------+---------------+---------+-----------+----------+--------------+ POP      Full           Yes      Yes                                 +---------+---------------+---------+-----------+----------+--------------+ PTV      Full                    Yes                                 +---------+---------------+---------+-----------+----------+--------------+ PERO     Full                    Yes                                 +---------+---------------+---------+-----------+----------+--------------+     Summary: BILATERAL: - No evidence of deep vein thrombosis seen in the lower extremities, bilaterally. -No evidence of popliteal cyst, bilaterally.   *See table(s) above for measurements and observations. Electronically signed by Heath Lark on 10/16/2023 at 4:15:05 PM.    Final    ECHOCARDIOGRAM COMPLETE Result Date: 10/16/2023    ECHOCARDIOGRAM REPORT   Patient Name:   Micheal Leonard Date of Exam: 10/16/2023 Medical Rec #:  [id number]        Height:       73.0 in Accession #:    [id number]       Weight:       215.0 lb Date of Birth:  [birth year]        BSA:          2.219 m Patient Age:    27 years         BP:           169/114 mmHg Patient Gender: M                HR:           113 bpm. Exam Location:  Inpatient Procedure: 2D Echo, Color Doppler and Cardiac Doppler (Both Spectral and Color            Flow Doppler were utilized during procedure). Indications:    Elevated Troponins  History:        Patient has prior history of Echocardiogram examinations, most                 recent 04/03/2023. CHF, Signs/Symptoms:HIV; Risk                 Factors:Hypertension. 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  Sonographer:    Irving Burton Senior RDCS Referring Phys: [id number] Angie Fava  Sonographer Comments: Definity not used due to known right to left shunting IMPRESSIONS   1. Single ventricle morphology- the LV is the systemic ventricle. Left ventricular ejection fraction, by estimation, is 25 to 30%. Left ventricular ejection fraction by PLAX is 26 %. The left ventricle has severely decreased function. The left ventricle  demonstrates global hypokinesis. The left ventricular internal cavity size was severely dilated. Left ventricular diastolic parameters are indeterminate.  2. Flow across the VSD and ASD are both unrestricted. Evidence of atrial level shunting detected by color flow Doppler. 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  3. 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  4. The right atrium is very small consistent with Fontan physiology.  5. The mitral valve is abnormal. Severe mitral valve regurgitation.  6. 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  7. The aortic valve is tricuspid. Aortic valve regurgitation is trivial.  8. IVC is directed into an extracardiac Fontan which has low velocity respirophasic flow indicative of normal Fontan physiology.     The SVC is directed into a bidirectional [name] shunt which has low velocity respirophasic flow indicative of normal Glenn physiology. The inferior vena cava is normal in size with <50% respiratory variability, suggesting right atrial pressure of 8 mmHg. Comparison(s): Prior images unable to be directly viewed, comparison made by report only. Changes from prior study are noted. 04/03/2023 ([facility]): LVEF 25%, mild to moderate MR. FINDINGS  Left Ventricle: Single ventricle morphology- the LV is the systemic ventricle. Left ventricular ejection fraction, by estimation, is 25 to 30%. Left ventricular ejection fraction by PLAX is 26 %. The left ventricle has severely decreased function. The left ventricle demonstrates global hypokinesis. The left ventricular internal cavity size was severely dilated.  There is no left ventricular hypertrophy. Left ventricular diastolic parameters are indeterminate. 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 Left Atrium: Left atrial size was normal in size. Right Atrium: The right atrium is very small consistent with Fontan physiology. Pericardium: There is no evidence of pericardial effusion. Mitral Valve: The mitral valve is abnormal. Severe mitral valve regurgitation. 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 Aortic Valve: The aortic valve is tricuspid. Aortic valve regurgitation is trivial. Pulmonic Valve: The pulmonic valve was grossly normal. Pulmonic valve regurgitation is not visualized. Aorta: The aortic root and ascending aorta are structurally normal, with no evidence of dilitation. Venous: IVC is directed into an extracardiac Fontan which has low velocity respirophasic flow indicative of normal Fontan physiology. The SVC is directed into a bidirectional [name] shunt which has low velocity respirophasic flow indicative of normal Glenn physiology. The inferior vena cava is normal in size with less than 50% respiratory variability, suggesting right atrial pressure of  8 mmHg. IAS/Shunts: Evidence of atrial level shunting detected by color flow Doppler. There is a small membranous (infracristal) ventricular septal defect with bidirectional shunting. 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  LEFT VENTRICLE PLAX 2D LV EF:         Left            Diastology  ventricular     LV e' medial:   7.20 cm/s                ejection        LV E/e' medial: 7.9                fraction by                PLAX is 26                %. LVIDd:         7.30 cm LVIDs:         6.40 cm LV PW:         1.10 cm LV IVS:        1.00 cm LVOT diam:     2.60 cm LV SV:         33 LV SV Index:   15 LVOT Area:     5.31 cm  LEFT ATRIUM             Index LA diam:        3.00 cm 1.35 cm/m LA Vol (A2C):   68.0 ml  30.65 ml/m LA Vol (A4C):   39.0 ml 17.58 ml/m LA Biplane Vol: 53.4 ml 24.07 ml/m  AORTIC VALVE LVOT Vmax:   48.50 cm/s LVOT Vmean:  35.050 cm/s LVOT VTI:    0.063 m  AORTA Ao Root diam: 3.40 cm Ao Asc diam:  3.10 cm MITRAL VALVE MV Area (PHT): 4.33 cm       SHUNTS MV Decel Time: 175 msec       Systemic VTI:  0.06 m MR Peak grad:    92.9 mmHg    Systemic Diam: 2.60 cm MR Mean grad:    48.0 mmHg MR Vmax:         482.00 cm/s MR Vmean:        328.0 cm/s MR PISA:         4.02 cm MR PISA Eff ROA: 32 mm MR PISA Radius:  0.80 cm MV E velocity: 56.70 cm/s MV A velocity: 34.40 cm/s MV E/A ratio:  1.65 Zoila Shutter MD Electronically signed by Zoila Shutter MD Signature Date/Time: 10/16/2023/2:38:48 PM    Final      Scheduled Meds:  amLODipine  5 mg Oral Daily   aspirin EC  81 mg Oral Daily   empagliflozin  10 mg Oral Daily   furosemide  80 mg Intravenous BID   loratadine  10 mg Oral Daily   sacubitril-valsartan  1 tablet Oral BID   Continuous Infusions:   LOS: 3 days      Osvaldo Shipper, MD [facility]

## 2023-10-18 NOTE — Progress Notes (Signed)
 Mobility Specialist Progress Note:   10/18/23 1558  Mobility  Activity Ambulated independently in hallway  Level of Assistance Independent after set-up  Assistive Device None  Distance Ambulated (ft) 940 ft  Activity Response Tolerated well  Mobility Referral Yes  Mobility visit 1 Mobility  Mobility Specialist Start Time (ACUTE ONLY) 1505  Mobility Specialist Stop Time (ACUTE ONLY) 1520  Mobility Specialist Time Calculation (min) (ACUTE ONLY) 15 min   Pre Mobility: 116 HR , 91% SpO2 3 L  During Mobility: 118 HR , 82%-100% SpO2 RA- 3 L Post Mobility: 114 HR , 89% SpO2 3 L  Pt received in bed, agreeable to mobility per RN request. Difficult to get accurate SpO2 reading d/t poor pleth. Pt desat on 3 L to 82% but quickly elevated to 99% multiple times throughout session. Ambulated on RA for about 439ft and pt desat to 84% but elevated to 91% after 482ft. Pt denied any SOB or any discomfort during session. Pt left in chair on 3 L with call bell in reach and all needs met. RN notified.    Leory Plowman  Mobility Specialist Please contact via [employer] office at [phone]

## 2023-10-18 NOTE — Progress Notes (Signed)
 Progress Note  Patient Name: Micheal Leonard Date of Encounter: 10/18/2023 Primary Cardiologist: Kateri Mc Corey Harold, adult congenital service)   Subjective   Pt feeling some better    He still appears a bit tachypneic going from BR to bed   Denies CP   No palpitations   Vital Signs    Vitals:   10/17/23 1124 10/17/23 1636 10/17/23 1953 10/17/23 2353  BP: (!) 158/112 (!) 157/102 126/78 119/78  Pulse: (!) 112 (!) 113 (!) 109   Resp: (!) 25 20 18 20   Temp: 98 F (36.7 C) 97.9 F (36.6 C) 97.9 F (36.6 C) (!) 97.5 F (36.4 C)  TempSrc: Oral Oral Oral Oral  SpO2: 92%  93%   Weight:        Intake/Output Summary (Last 24 hours) at 10/18/2023 0726 Last data filed at 10/18/2023 0412 Gross per 24 hour  Intake 720 ml  Output 2700 ml  Net -1980 ml   Filed Weights   10/17/23 0427  Weight: 92.4 kg    Physical Exam   GEN: No acute distress.  ON 2 L  Sats 88 to 92%  (doesn't wear O2 at home ) Neck: JVP is normal  Cardiac: RRR tachy     Respiratory: Clear to auscultation bilaterally.  GI: Soft, nontender, non-distended  MS: No significant edema  Labs   Telemetry: sinus tachycardia   Chemistry Recent Labs  Lab 10/15/23 2040 10/16/23 0532 10/17/23 0411 10/17/23 1122 10/18/23 0315  NA 138 136  --  137  --   K 3.6 3.7  --  4.2  --   CL 110 110  --  106  --   CO2 18* 17*  --  19*  --   GLUCOSE 109* 106*  --  127*  --   BUN 10 10  --  10  --   CREATININE 1.29* 1.12  --  1.18  --   CALCIUM 8.8* 8.3*  --  9.2  --   PROT 7.4 7.1 7.4  --  8.1  ALBUMIN 3.7 3.3* 3.4*  --  3.6  AST 45* 36 40  --  55*  ALT 34 28 32  --  35  ALKPHOS 89 74 80  --  88  BILITOT 1.2 1.6* 1.0  --  1.2  GFRNONAA >60 >60  --  >60  --   ANIONGAP 10 9  --  12  --      Hematology Recent Labs  Lab 10/15/23 2040 10/16/23 0532  WBC 8.3 6.0  RBC 5.65 5.29  HGB 16.4 15.6  HCT 49.7 46.2  MCV 88.0 87.3  MCH 29.0 29.5  MCHC 33.0 33.8  RDW 14.1 14.1  PLT 255 228    Cardiac EnzymesNo  results for input(s): "TROPONINI" in the last 168 hours. No results for input(s): "TROPIPOC" in the last 168 hours.   BNP Recent Labs  Lab 10/15/23 2040 10/16/23 0532 10/17/23 0411  BNP 361.3* 374.3* 138.8*     DDimer  Recent Labs  Lab 10/17/23 1122  DDIMER 2.84*     Cardiac Studies   Echo 10/17/23  1. Single ventricle morphology- the LV is the systemic ventricle. Left  ventricular ejection fraction, by estimation, is 25 to 30%. Left  ventricular ejection fraction by PLAX is 26 %. The left ventricle has  severely decreased function. The left ventricle   demonstrates global hypokinesis. The left ventricular internal cavity  size was severely dilated. Left ventricular diastolic parameters are  indeterminate.  2. Flow across the VSD and ASD are both unrestricted. Evidence of atrial  level shunting detected by color flow Doppler. 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   3. 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   4. The right atrium is very small consistent with Fontan physiology.   5. The mitral valve is abnormal. Severe mitral valve regurgitation.   6. 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   7. The aortic valve is tricuspid. Aortic valve regurgitation is trivial.   8. 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      The SVC is directed into a bidirectional [name] shunt which has low  velocity respirophasic flow indicative of normal Glenn physiology. The  inferior vena cava is normal in size with <50% respiratory variability,  suggesting right atrial pressure of 8  mmHg.   Comparison(s): Prior images unable to be directly viewed, comparison made  by report only. Changes from prior study are noted. 04/03/2023 ([facility]): LVEF  25%, mild to moderate MR.       Assessment & Plan   Congenital Heart Disease  \ Pt with tricuspid atresia, ASD/VSD.  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)   He is followed at [facility] by Dr Deatra James Severely depressed LVEF noted in Oct 2024 (new) Echo at [facility] in Oct 2024 RV rudimentary  VSD present   LVEF is severely depressed at 25% (2024, [facility])  MIld to moderate MR  Last seen by Dr Deatra James in Jan 2025    Optimizing GDMT  Pt hypertensive at the time   Probably not taking meds      Presented to [facility] with cough and SOB for 2 wks    CT done    On my review with  radiology not definite for small subsegmental LLL PE  Initially given gentile IV fluids     Meds restarted   Then started on IV lasix  80 bid   I/O not accurate    Today, the pt says his breathing has improved   Not at baseline   on O2   Difficlt to assess Echo done on 4/3 shows severe LV dysfunction as previously noted    There is also severe MR which is new compared to that reported from [facility]   MR is very  eccentrc   Pt very comfortable at rest now     His sats on RA at [facility] in Jan were 91%  REcomm: Hold further IV lasix   Follow  Titrate O2 down and follow sats   Expected high 80s to low 90s      2  HFrEF   As noted above   Pt started back on GDMT   Diuresed with IV lasix   Follow   3  Mitral regurgitation. I have reviewed echo  There is a very eccentric jet of MR Would need TEE to evaluate further    For now goal is treat volume, resume GDMT    Control BP  Long term follow up will be at [facility]   4   Question PE   I have reviewed with radiology the CT that was done earlier this admit   He d dimer was elevated but CT was not definite for small subsegmental PE  LE dopplers are negative     5  Rhythm  Pt's heart rates have been 80s to 110s  Mostly around 100s     Review of EKG  it appears to be ectopic atrial rhythm   For questions or updates, please contact [facility] Please  consult [email or web address] for contact info under Cardiology/STEMI.   Dietrich Pates MD

## 2023-10-18 NOTE — Plan of Care (Signed)

## 2023-10-19 DIAGNOSIS — I5022 Chronic systolic (congestive) heart failure: Secondary | ICD-10-CM | POA: Diagnosis not present

## 2023-10-19 LAB — BASIC METABOLIC PANEL WITH GFR
Anion gap: 14 (ref 5–15)
BUN: 27 mg/dL — ABNORMAL HIGH (ref 6–20)
CO2: 18 mmol/L — ABNORMAL LOW (ref 22–32)
Calcium: 8.8 mg/dL — ABNORMAL LOW (ref 8.9–10.3)
Chloride: 101 mmol/L (ref 98–111)
Creatinine, Ser: 1.41 mg/dL — ABNORMAL HIGH (ref 0.61–1.24)
GFR, Estimated: >60 mL/min (ref >60)
Glucose, Bld: 95 mg/dL (ref 70–99)
Potassium: 3.8 mmol/L (ref 3.5–5.1)
Sodium: 133 mmol/L — ABNORMAL LOW (ref 135–145)

## 2023-10-19 LAB — HEPATIC FUNCTION PANEL
ALT: 44 U/L (ref 0–44)
AST: 63 U/L — ABNORMAL HIGH (ref 15–41)
Albumin: 3.4 g/dL — ABNORMAL LOW (ref 3.5–5.0)
Alkaline Phosphatase: 99 U/L (ref 38–126)
Bilirubin, Direct: 0.3 mg/dL — ABNORMAL HIGH (ref 0.0–0.2)
Indirect Bilirubin: 0.8 mg/dL (ref 0.3–0.9)
Total Bilirubin: 1.1 mg/dL (ref 0.0–1.2)
Total Protein: 7.2 g/dL (ref 6.5–8.1)

## 2023-10-19 MED ORDER — BISMUTH SUBSALICYLATE 262 MG PO CHEW: 262.0000 mg | CHEWABLE_TABLET | ORAL | 0 refills | Status: AC | PRN

## 2023-10-19 MED ORDER — ASPIRIN 81 MG PO TBEC: 81.0000 mg | DELAYED_RELEASE_TABLET | Freq: Every day | ORAL | 12 refills | Status: AC

## 2023-10-19 MED ORDER — EMPAGLIFLOZIN 10 MG PO TABS: 10.0000 mg | ORAL_TABLET | Freq: Every day | ORAL | 1 refills | Status: DC

## 2023-10-19 MED ORDER — EXCEDRIN EXTRA STRENGTH 250-250-65 MG PO TABS: 2.0000 | ORAL_TABLET | Freq: Four times a day (QID) | ORAL | 0 refills | Status: DC | PRN

## 2023-10-19 MED ORDER — ENTRESTO 24-26 MG PO TABS: 1.0000 | ORAL_TABLET | Freq: Two times a day (BID) | ORAL | 0 refills | Status: DC

## 2023-10-19 MED ORDER — SPIRONOLACTONE 25 MG PO TABS: 25.0000 mg | ORAL_TABLET | Freq: Every day | ORAL | 0 refills | Status: DC

## 2023-10-19 MED ORDER — FEXOFENADINE HCL 180 MG PO TABS: 180.0000 mg | ORAL_TABLET | Freq: Every day | ORAL | 0 refills | Status: AC

## 2023-10-19 NOTE — Progress Notes (Signed)
 Discharge education reviewed with patient.  Pt states he is out of all his meds, so primary MD sent meds to [facility] on [address], at pt request as [facility] pharmacy is closed on today, Sunday.  Pt advised to contact his PCP and/or cardiologist if any issues obtaining meds.  Pt also provided with work note. IV and telemetry removed.

## 2023-10-19 NOTE — Discharge Summary (Signed)
 [facility]  Physician Discharge Summary   Patient ID: Micheal Leonard MRN: [id number] DOB/AGE: 02-23-1996 28 y.o.  Admit date: 10/15/2023 Discharge date:   10/19/2023   PCP: Pcp, No  DISCHARGE DIAGNOSES:    Acute hypoxic respiratory failure (HCC)   HIV (human immunodeficiency virus infection) (HCC)   Chronic systolic CHF (congestive heart failure) (HCC)   Essential hypertension   Allergic rhinitis   RECOMMENDATIONS FOR OUTPATIENT FOLLOW UP: Patient to follow-up with his cardiologist at [facility] as soon as possible after discharge.  He will call to schedule appointment.   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  There was no chest pain or palpitations diaphoresis.  No orthopnea PND or edema.  Has history of PE in 2017.  Follows up at [facility] cardiology due to complex procedures.  HIV with recent CD4 count of 330.  He is on bimonthly monthly injection With Cabenuv last dose 3/26. At the emergency room he was afebrile.  Initial heart rate 120.  87% on room air.  Started on 3 L nasal cannula oxygen.  Electrolytes fairly normal.  Creatinine 1.29 slightly worse than usual.  BNP 361.  High sensitive troponins borderline elevated.  COVID, influenza, RSV negative.  Blood cultures were drawn.  CTA showed possible left lower lobe subsegmental PE.  No pneumonia.  Patient was started on oxygen and admitted to the hospital.  HOSPITAL COURSE:   Acute hypoxemic respiratory failure: Multifactorial. Acute bronchitis with no evidence of  bacterial infection. No evidence of infection.  Procalcitonin less than 0.1.  Symptomatic management with mucolytic's and bronchodilator therapy.   Has been weaned off of oxygen.  He is noted to have low oxygen saturations at times.  [facility] notes were reviewed.  He was noted to have saturation of 91% during his office visit in January.  This is probably baseline for him considering his history of congenital heart disease.  He remains asymptomatic.   Acute on chronic systolic heart failure with known ejection fraction of 25%. Has a history of congenital heart disease and followed at [facility]. Patient was on Entresto Jardiance and spironolactone prior to admission.  Dose of Entresto was increased which she did not tolerate with drop in blood pressure.  Sherryll Burger will be resumed back at his usual dose.  He was also given high-dose furosemide with the increase in creatinine and drop in blood pressure.  He will be discharged just on spironolactone as he was taking before.  No furosemide for now per cardiology.  Suspected PE: In the setting of his congenital heart disease and procedures he has undergone previously CT evaluation of PE is very difficult and VQ scan is unreliable. Lower extremity Doppler studies were negative for DVT. Patient not started on anticoagulation.  D-dimer noted to be elevated however. Patient did present with hypoxemia, tachycardia which appears to have resolved without treatment for this presumed PE. Agree with previous decision to not treat this nonspecific finding at this time.  Patient remains asymptomatic currently.   HIV disease: Documented well-controlled.  On bimonthly injection.  Last dose 3/26.   Essential hypertension: Low blood  pressures noted with increase in Entresto and furosemide.  Amlodipine will be held for now at discharge.    Patient stable.  Wants to go home.  Borderline low oxygen saturations noted but seems to be baseline for him.  Cardiology will attempt to  reach his cardiologist at [facility] tomorrow to schedule close follow-up.  Patient will also reach out to his cardiologist to schedule close follow-up.  Otherwise he seems to be stable for discharge.  PERTINENT LABS:  The results of significant diagnostics from this hospitalization (including imaging, microbiology, ancillary and laboratory) are listed below for reference.    Microbiology: Recent Results (from the past 240 hours)  Resp panel by RT-PCR (RSV, Flu A&B, Covid) Anterior Nasal Swab     Status: None   Collection Time: 10/15/23  8:53 PM   Specimen: Anterior Nasal Swab  Result Value Ref Range Status   SARS Coronavirus 2 by RT PCR NEGATIVE NEGATIVE Final   Influenza A by PCR NEGATIVE NEGATIVE Final   Influenza B by PCR NEGATIVE NEGATIVE Final    Comment: (NOTE) The Xpert Xpress SARS-CoV-2/FLU/RSV plus assay is intended as an aid in the diagnosis of influenza from Nasopharyngeal swab specimens and should not be used as a sole basis for treatment. Nasal washings and aspirates are unacceptable for Xpert Xpress SARS-CoV-2/FLU/RSV testing.  Fact Sheet for Patients: BloggerCourse.com  Fact Sheet for Healthcare Providers: SeriousBroker.it  This test is not yet approved or cleared by the [place] FDA and has been authorized for detection and/or diagnosis of SARS-CoV-2 by FDA under an Emergency Use Authorization (EUA). This EUA will remain in effect (meaning this test can be used) for the duration of the COVID-19 declaration under Section 564(b)(1) of the Act, 21 U.S.C. section 360bbb-3(b)(1), unless the authorization is terminated or revoked.     Resp Syncytial Virus by PCR NEGATIVE NEGATIVE Final    Comment: (NOTE) Fact Sheet for Patients: BloggerCourse.com  Fact Sheet for Healthcare Providers: SeriousBroker.it  This test is not yet approved or cleared by the [place]  FDA and has been authorized for detection and/or diagnosis of SARS-CoV-2 by FDA under an Emergency Use Authorization (EUA). This EUA will remain in effect (meaning this test can be used) for the duration of the COVID-19 declaration under Section 564(b)(1) of the Act, 21 U.S.C. section 360bbb-3(b)(1), unless the authorization is terminated or revoked.  Performed at [facility], [address], [place], [place] [address]   Blood Culture (routine x 2)     Status: None (Preliminary result)   Collection Time: 10/15/23  9:00 PM   Specimen: BLOOD  Result Value Ref Range Status   Specimen Description BLOOD RIGHT ANTECUBITAL  Final   Special Requests   Final    BOTTLES DRAWN AEROBIC AND ANAEROBIC Blood Culture adequate volume   Culture   Final    NO GROWTH 4 DAYS Performed at [facility], [address], [place], [place] [address]    Report Status PENDING  Incomplete  Blood Culture (routine x 2)     Status: None (Preliminary result)   Collection Time: 10/15/23  9:05 PM   Specimen: BLOOD LEFT FOREARM  Result Value Ref Range Status   Specimen Description BLOOD LEFT FOREARM  Final   Special Requests   Final    BOTTLES DRAWN AEROBIC AND ANAEROBIC Blood Culture adequate volume   Culture   Final    NO GROWTH 4 DAYS Performed at [facility], [address], [place],  [place] [address]    Report Status PENDING  Incomplete     Labs:   Basic Metabolic Panel: Recent Labs  Lab 10/15/23 2040 10/15/23 2321 10/16/23 0532 10/17/23 1122 10/18/23 1042 10/19/23 0321  NA 138  --  136 137 135 133*  K 3.6  --  3.7 4.2 3.7 3.8  CL 110  --  110 106 102 101  CO2 18*  --  17* 19* 21* 18*  GLUCOSE 109*  --  106* 127* 131* 95  BUN 10  --  10 10 14  27*  CREATININE 1.29*  --  1.12 1.18 1.43* 1.41*  CALCIUM 8.8*  --  8.3* 9.2 9.1 8.8*  MG  --  1.9 2.0  --   --   --   PHOS  --   --  3.1  --   --   --    Liver Function Tests: Recent Labs  Lab 10/15/23 2040 10/16/23 0532  10/17/23 0411 10/18/23 0315 10/19/23 0321  AST 45* 36 40 55* 63*  ALT 34 28 32 35 44  ALKPHOS 89 74 80 88 99  BILITOT 1.2 1.6* 1.0 1.2 1.1  PROT 7.4 7.1 7.4 8.1 7.2  ALBUMIN 3.7 3.3* 3.4* 3.6 3.4*    CBC: Recent Labs  Lab 10/15/23 2040 10/16/23 0532  WBC 8.3 6.0  NEUTROABS 5.5 3.7  HGB 16.4 15.6  HCT 49.7 46.2  MCV 88.0 87.3  PLT 255 228   Cardiac Enzymes: Recent Labs  Lab 10/15/23 2030  CKTOTAL 598*   BNP: BNP (last 3 results) Recent Labs    10/15/23 2040 10/16/23 0532 10/17/23 0411  BNP 361.3* 374.3* 138.8*     IMAGING STUDIES VAS [place] LOWER EXTREMITY VENOUS (DVT) Result Date: 10/16/2023  Lower Venous DVT Study Patient Name:  ROCIO WOLAK  Date of Exam:   10/16/2023 Medical Rec #: [id number]         Accession #:    [id number] Date of Birth: [birth year]         Patient Gender: M Patient Age:   63 years Exam Location:  [facility] Procedure:      VAS [place] LOWER EXTREMITY VENOUS (DVT) Referring Phys: Newton Pigg --------------------------------------------------------------------------------  Indications: Pain.  Risk Factors: Confirmed PE. Comparison Study: None. Performing Technologist: Shona Simpson  Examination Guidelines: A complete evaluation includes B-mode imaging, spectral Doppler, color Doppler, and power Doppler as needed of all accessible portions of each vessel. Bilateral testing is considered an integral part of a complete examination. Limited examinations for reoccurring indications may be performed as noted. The reflux portion of the exam is performed with the patient in reverse Trendelenburg.  +---------+---------------+---------+-----------+----------+--------------+ RIGHT    CompressibilityPhasicitySpontaneityPropertiesThrombus Aging +---------+---------------+---------+-----------+----------+--------------+ CFV      Full           Yes      Yes                                  +---------+---------------+---------+-----------+----------+--------------+ SFJ      Full                                                        +---------+---------------+---------+-----------+----------+--------------+ FV Prox  Full                                                        +---------+---------------+---------+-----------+----------+--------------+  FV Mid   Full                                                        +---------+---------------+---------+-----------+----------+--------------+ FV DistalFull                                                        +---------+---------------+---------+-----------+----------+--------------+ PFV      Full                                                        +---------+---------------+---------+-----------+----------+--------------+ POP      Full           Yes      Yes                                 +---------+---------------+---------+-----------+----------+--------------+ PTV      Full                    Yes                                 +---------+---------------+---------+-----------+----------+--------------+ PERO     Full                    Yes                                 +---------+---------------+---------+-----------+----------+--------------+   +---------+---------------+---------+-----------+----------+--------------+ LEFT     CompressibilityPhasicitySpontaneityPropertiesThrombus Aging +---------+---------------+---------+-----------+----------+--------------+ CFV      Full           Yes      Yes                                 +---------+---------------+---------+-----------+----------+--------------+ SFJ      Full                                                        +---------+---------------+---------+-----------+----------+--------------+ FV Prox  Full                                                         +---------+---------------+---------+-----------+----------+--------------+ FV Mid   Full                                                        +---------+---------------+---------+-----------+----------+--------------+  FV DistalFull                                                        +---------+---------------+---------+-----------+----------+--------------+ PFV      Full                                                        +---------+---------------+---------+-----------+----------+--------------+ POP      Full           Yes      Yes                                 +---------+---------------+---------+-----------+----------+--------------+ PTV      Full                    Yes                                 +---------+---------------+---------+-----------+----------+--------------+ PERO     Full                    Yes                                 +---------+---------------+---------+-----------+----------+--------------+     Summary: BILATERAL: - No evidence of deep vein thrombosis seen in the lower extremities, bilaterally. -No evidence of popliteal cyst, bilaterally.   *See table(s) above for measurements and observations. Electronically signed by Heath Lark on 10/16/2023 at 4:15:05 PM.    Final    ECHOCARDIOGRAM COMPLETE Result Date: 10/16/2023    ECHOCARDIOGRAM REPORT   Patient Name:   CHARLI LIBERATORE Date of Exam: 10/16/2023 Medical Rec #:  [id number]        Height:       73.0 in Accession #:    [id number]       Weight:       215.0 lb Date of Birth:  Nov 20, 1995        BSA:          2.219 m Patient Age:    27 years         BP:           169/114 mmHg Patient Gender: M                HR:           113 bpm. Exam Location:  Inpatient Procedure: 2D Echo, Color Doppler and Cardiac Doppler (Both Spectral and Color            Flow Doppler were utilized during procedure). Indications:    Elevated Troponins  History:        Patient has prior history of Echocardiogram  examinations, most                 recent 04/03/2023. CHF, Signs/Symptoms:HIV; Risk                 Factors:Hypertension. 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  Sonographer:    Irving Burton Senior RDCS Referring Phys: [id number] Angie Fava  Sonographer Comments: Definity not used due to known right to left shunting IMPRESSIONS  1. Single ventricle morphology- the LV is the systemic ventricle. Left ventricular ejection fraction, by estimation, is 25 to 30%. Left ventricular ejection fraction by PLAX is 26 %. The left ventricle has severely decreased function. The left ventricle  demonstrates global hypokinesis. The left ventricular internal cavity size was severely dilated. Left ventricular diastolic parameters are indeterminate.  2. Flow across the VSD and ASD are both unrestricted. Evidence of atrial level shunting detected by color flow Doppler. 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  3. 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  4. The right atrium is very small consistent with Fontan physiology.  5. The mitral valve is abnormal. Severe mitral valve regurgitation.  6. 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  7. The aortic valve is tricuspid. Aortic valve regurgitation is trivial.  8. IVC is directed into an extracardiac Fontan which has low velocity respirophasic flow indicative of normal Fontan physiology.     The SVC is directed into a bidirectional [name] shunt which has low velocity respirophasic flow indicative of normal Glenn physiology. The inferior vena cava is normal in size with <50% respiratory variability, suggesting right atrial pressure of 8 mmHg. Comparison(s): Prior images unable to be directly viewed, comparison made by report only. Changes from  prior study are noted. 04/03/2023 ([facility]): LVEF 25%, mild to moderate MR. FINDINGS  Left Ventricle: Single ventricle morphology- the LV is the systemic ventricle. Left ventricular ejection fraction, by estimation, is 25 to 30%. Left ventricular ejection fraction by PLAX is 26 %. The left ventricle has severely decreased function. The left ventricle demonstrates global hypokinesis. The left ventricular internal cavity size was severely dilated. There is no left ventricular hypertrophy. Left ventricular diastolic parameters are indeterminate. 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 Left Atrium: Left atrial size was normal in size. Right Atrium: The right atrium is very small consistent with Fontan physiology. Pericardium: There is no evidence of pericardial effusion. Mitral Valve: The mitral valve is abnormal. Severe mitral valve regurgitation. 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 Aortic Valve: The aortic valve is tricuspid. Aortic valve regurgitation is trivial. Pulmonic Valve: The pulmonic valve was grossly normal. Pulmonic valve regurgitation is not visualized. Aorta: The aortic root and ascending aorta are structurally normal, with no evidence of dilitation. Venous: IVC is directed into an extracardiac Fontan which has low velocity respirophasic flow indicative of normal Fontan physiology. The SVC is directed into a bidirectional [name] shunt which has low velocity respirophasic flow indicative of normal Glenn physiology. The inferior vena cava is normal in size with less than 50% respiratory variability, suggesting right atrial pressure of  8 mmHg. IAS/Shunts: Evidence of atrial level shunting detected by color flow Doppler. There is a small membranous (infracristal) ventricular septal defect with bidirectional shunting. 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  LEFT VENTRICLE PLAX 2D LV EF:         Left            Diastology  ventricular     LV e' medial:   7.20 cm/s                ejection        LV E/e' medial: 7.9                fraction by                PLAX is 26                %. LVIDd:         7.30 cm LVIDs:         6.40 cm LV PW:         1.10 cm LV IVS:        1.00 cm LVOT diam:     2.60 cm LV SV:         33 LV SV Index:   15 LVOT Area:     5.31 cm  LEFT ATRIUM             Index LA diam:        3.00 cm 1.35 cm/m LA Vol (A2C):   68.0 ml 30.65 ml/m LA Vol (A4C):   39.0 ml 17.58 ml/m LA Biplane Vol: 53.4 ml 24.07 ml/m  AORTIC VALVE LVOT Vmax:   48.50 cm/s LVOT Vmean:  35.050 cm/s LVOT VTI:    0.063 m  AORTA Ao Root diam: 3.40 cm Ao Asc diam:  3.10 cm MITRAL VALVE MV Area (PHT): 4.33 cm       SHUNTS MV Decel Time: 175 msec       Systemic VTI:  0.06 m MR Peak grad:    92.9 mmHg    Systemic Diam: 2.60 cm MR Mean grad:    48.0 mmHg MR Vmax:         482.00 cm/s MR Vmean:        328.0 cm/s MR PISA:         4.02 cm MR PISA Eff ROA: 32 mm MR PISA Radius:  0.80 cm MV E velocity: 56.70 cm/s MV A velocity: 34.40 cm/s MV E/A ratio:  1.65 Zoila Shutter MD Electronically signed by Zoila Shutter MD Signature Date/Time: 10/16/2023/2:38:48 PM    Final    DG Chest Port 1 View Result Date: 10/16/2023 CLINICAL DATA:  Hypoxic seal, tachycardia EXAM: PORTABLE CHEST 1 VIEW COMPARISON:  10/15/2023 FINDINGS: Lungs are clear. No pneumothorax or pleural effusion. Stable cardiomegaly. Embolization plugs are seen within the left perihilar region, unchanged. Pulmonary vascularity is normal. No acute bone abnormality. IMPRESSION: 1. No active disease. 2. Cardiomegaly. Electronically Signed   By: Helyn Numbers M.D.   On: 10/16/2023 04:12   CT Angio Chest PE W and/or Wo Contrast Result Date: 10/15/2023 CLINICAL DATA:  Pulmonary embolism (PE) suspected, high prob. intermittent right side body numbness onset approx. 6 am this morning with mild nausea, speech clear with no facial asymmetry , alert and oriented /ambulatory . History of  tricuspid atresia and Fontan procedure. EXAM: CT ANGIOGRAPHY CHEST WITH CONTRAST TECHNIQUE: Multidetector CT imaging of the chest was performed using the standard protocol during bolus administration of intravenous contrast. Multiplanar CT image reconstructions and MIPs were obtained to evaluate the vascular anatomy. RADIATION DOSE REDUCTION: This exam was performed according to the departmental dose-optimization program which includes automated exposure control, adjustment of the mA and/or kV according to patient size and/or use of iterative reconstruction technique. CONTRAST:  75mL OMNIPAQUE IOHEXOL 350 MG/ML SOLN COMPARISON:  CT angio chest 10/23/2016 FINDINGS: Cardiovascular: Prior Fontan procedure surgical changes noted. Satisfactory opacification of the pulmonary arteries to the segmental level. Likely left lower lobe subsegmental pulmonary embolus (7:294). Enlarged right heart size consistent with history of tricuspid atresia. No pericardial effusion. Mediastinum/Nodes: No enlarged mediastinal, hilar, or axillary lymph nodes. Thyroid gland, trachea, and esophagus demonstrate no significant findings. Lungs/Pleura: No focal consolidation. Stable right upper lobe 5 x 4 mm pulmonary nodule. No pulmonary mass. No pleural effusion. No pneumothorax. Upper Abdomen: No acute abnormality. Musculoskeletal: No chest wall abnormality.  Bilateral gynecomastia. No suspicious lytic or blastic osseous lesions. No acute displaced fracture. Multilevel degenerative changes of the spine. Review of the MIP images confirms the above findings. IMPRESSION: 1. Likely left lower lobe subsegmental pulmonary embolus with no associated pulmonary infarction or right heart strain. 2. Fontan procedure surgical changes. Electronically Signed   By: Tish Frederickson M.D.   On: 10/15/2023 22:39   DG Chest Port 1 View Result Date: 10/15/2023 CLINICAL DATA:  Cough and hypoxia EXAM: PORTABLE CHEST 1 VIEW COMPARISON:  10/22/2016 FINDINGS: Increased  size of the cardiomediastinal silhouette compared to 10/22/2016. Cardiac and mediastinal closure devices. Pulmonary vascular congestion. No focal consolidation, pleural effusion, or pneumothorax. No displaced rib fractures. IMPRESSION: 1. Increased size of the cardiomediastinal silhouette compared to 10/22/2016. This may be due to hypertrophy or pericardial effusion. Recommend correlation with echocardiogram. 2. Pulmonary vascular congestion. Electronically Signed   By: Minerva Fester M.D.   On: 10/15/2023 21:36    DISCHARGE EXAMINATION: Vitals:   10/19/23 0004 10/19/23 0500 10/19/23 0600 10/19/23 0654  BP:    95/60  Pulse: 99 90  97  Resp: 20 (!) 21 (!) 21 19  Temp: 97.8 F (36.6 C)   97.8 F (36.6 C)  TempSrc: Oral   Oral  SpO2: 91% (!) 89% 100% 91%  Weight:  90.8 kg     General appearance: Awake alert.  In no distress Resp: Clear to auscultation bilaterally.  Normal effort Cardio: S1-S2 is normal regular.  No S3-S4.  No rubs murmurs or bruit GI: Abdomen is soft.  Nontender nondistended.  Bowel sounds are present normal.  No masses organomegaly   DISPOSITION: Home  Discharge Instructions     (HEART FAILURE PATIENTS) Call MD:  Anytime you have any of the following symptoms: 1) 3 pound weight gain in 24 hours or 5 pounds in 1 week 2) shortness of breath, with or without a dry hacking cough 3) swelling in the hands, feet or stomach 4) if you have to sleep on extra pillows at night in order to breathe.   Complete by: As directed    Call MD for:  difficulty breathing, headache or visual disturbances   Complete by: As directed    Call MD for:  extreme fatigue   Complete by: As directed    Call MD for:  persistant dizziness or light-headedness   Complete by: As directed    Call MD for:  persistant nausea and vomiting   Complete by: As directed    Call MD for:  severe uncontrolled pain   Complete by: As directed    Call MD for:  temperature >100.4   Complete by: As directed    Diet  - low sodium heart healthy   Complete by: As directed    Discharge instructions   Complete by: As directed    Please take your medications as prescribed.  Please call your cardiologist at [facility] tomorrow to set up an appointment within  the next few days.  You were cared for by a hospitalist during your hospital stay. If you have any questions about your discharge medications or the care you received while you were in the hospital after you are discharged, you can call the unit and asked to speak with the hospitalist on call if the hospitalist that took care of you is not available. Once you are discharged, your primary care physician will handle any further medical issues. Please note that NO REFILLS for any discharge medications will be authorized once you are discharged, as it is imperative that you return to your primary care physician (or establish a relationship with a primary care physician if you do not have one) for your aftercare needs so that they can reassess your need for medications and monitor your lab values. If you do not have a primary care physician, you can call [phone] for a physician referral.   Increase activity slowly   Complete by: As directed          Allergies as of 10/19/2023       Reactions   Other    An HIV drug and can not remember  exactly.        Medication List     STOP taking these medications    amLODipine 5 MG tablet Commonly known as: NORVASC   carvedilol 12.5 MG tablet Commonly known as: COREG       TAKE these medications    aspirin EC 81 MG tablet Take 1 tablet (81 mg total) by mouth daily.   bismuth subsalicylate 262 MG chewable tablet Commonly known as: PEPTO BISMOL Chew 1 tablet (262 mg total) by mouth as needed for indigestion (nausea).   Cabenuva 600 & 900 MG/3ML injection Generic drug: cabotegravir & rilpivirine ER Inject 1 kit into the muscle every 2 (two) months.   empagliflozin 10 MG Tabs tablet Commonly known as:  Jardiance Take 1 tablet (10 mg total) by mouth daily.   Entresto 24-26 MG Generic drug: sacubitril-valsartan Take 1 tablet by mouth 2 (two) times daily.   Excedrin Extra Strength 250-250-65 MG tablet Generic drug: aspirin-acetaminophen-caffeine Take 2 tablets by mouth every 6 (six) hours as needed for headache.   fexofenadine 180 MG tablet Commonly known as: ALLEGRA Take 1 tablet (180 mg total) by mouth daily.   spironolactone 25 MG tablet Commonly known as: ALDACTONE Take 1 tablet (25 mg total) by mouth daily.           TOTAL DISCHARGE TIME: 35 minutes  Amar Sippel [employer] on [email or web address]  10/19/2023, 10:51 AM

## 2023-10-19 NOTE — Plan of Care (Signed)

## 2023-10-19 NOTE — Progress Notes (Addendum)
 Pt SpO2 between 87-89% on RA.  MD aware. IS provided along with education and use encouraged in order to avoid need for supplemental O2.

## 2023-10-20 LAB — CULTURE, BLOOD (ROUTINE X 2)
Culture: NO GROWTH
Culture: NO GROWTH
Special Requests: ADEQUATE
Special Requests: ADEQUATE

## 2023-10-31 ENCOUNTER — Ambulatory Visit (HOSPITAL_COMMUNITY)
Admission: EM | Admit: 2023-10-31 | Discharge: 2023-10-31 | Disposition: A | Discharge status: Other Acute Inpatient Hospital | Attending: Physician Assistant | Admitting: Physician Assistant

## 2023-10-31 ENCOUNTER — Emergency Department (HOSPITAL_COMMUNITY)

## 2023-10-31 ENCOUNTER — Ambulatory Visit (HOSPITAL_COMMUNITY)

## 2023-10-31 ENCOUNTER — Inpatient Hospital Stay (HOSPITAL_COMMUNITY)
Admission: EM | Admit: 2023-10-31 | Discharge: 2023-11-01 | DRG: 291 | Disposition: A | Discharge status: Home / Self Care | Attending: Family Medicine | Admitting: Family Medicine

## 2023-10-31 ENCOUNTER — Encounter (HOSPITAL_COMMUNITY): Payer: Self-pay

## 2023-10-31 ENCOUNTER — Encounter (HOSPITAL_COMMUNITY): Payer: Self-pay | Admitting: Internal Medicine

## 2023-10-31 DIAGNOSIS — R0602 Shortness of breath: Secondary | ICD-10-CM

## 2023-10-31 DIAGNOSIS — I5023 Acute on chronic systolic (congestive) heart failure: Secondary | ICD-10-CM | POA: Diagnosis not present

## 2023-10-31 DIAGNOSIS — J9811 Atelectasis: Secondary | ICD-10-CM | POA: Diagnosis not present

## 2023-10-31 DIAGNOSIS — Z8249 Family history of ischemic heart disease and other diseases of the circulatory system: Secondary | ICD-10-CM

## 2023-10-31 DIAGNOSIS — R079 Chest pain, unspecified: Secondary | ICD-10-CM | POA: Diagnosis not present

## 2023-10-31 DIAGNOSIS — Z21 Asymptomatic human immunodeficiency virus [HIV] infection status: Secondary | ICD-10-CM | POA: Diagnosis not present

## 2023-10-31 DIAGNOSIS — Z7984 Long term (current) use of oral hypoglycemic drugs: Secondary | ICD-10-CM | POA: Diagnosis not present

## 2023-10-31 DIAGNOSIS — I10 Essential (primary) hypertension: Secondary | ICD-10-CM | POA: Diagnosis present

## 2023-10-31 DIAGNOSIS — R0789 Other chest pain: Secondary | ICD-10-CM | POA: Diagnosis present

## 2023-10-31 DIAGNOSIS — I420 Dilated cardiomyopathy: Secondary | ICD-10-CM | POA: Diagnosis not present

## 2023-10-31 DIAGNOSIS — Q224 Congenital tricuspid stenosis: Secondary | ICD-10-CM

## 2023-10-31 DIAGNOSIS — I2699 Other pulmonary embolism without acute cor pulmonale: Secondary | ICD-10-CM | POA: Diagnosis not present

## 2023-10-31 DIAGNOSIS — I110 Hypertensive heart disease with heart failure: Secondary | ICD-10-CM | POA: Diagnosis not present

## 2023-10-31 DIAGNOSIS — I2693 Single subsegmental pulmonary embolism without acute cor pulmonale: Secondary | ICD-10-CM | POA: Diagnosis not present

## 2023-10-31 DIAGNOSIS — R0603 Acute respiratory distress: Secondary | ICD-10-CM | POA: Diagnosis not present

## 2023-10-31 DIAGNOSIS — B20 Human immunodeficiency virus [HIV] disease: Secondary | ICD-10-CM | POA: Diagnosis present

## 2023-10-31 DIAGNOSIS — Z7982 Long term (current) use of aspirin: Secondary | ICD-10-CM

## 2023-10-31 DIAGNOSIS — I340 Nonrheumatic mitral (valve) insufficiency: Secondary | ICD-10-CM | POA: Diagnosis present

## 2023-10-31 DIAGNOSIS — J9601 Acute respiratory failure with hypoxia: Secondary | ICD-10-CM | POA: Diagnosis not present

## 2023-10-31 DIAGNOSIS — Z86711 Personal history of pulmonary embolism: Secondary | ICD-10-CM | POA: Diagnosis not present

## 2023-10-31 DIAGNOSIS — R7989 Other specified abnormal findings of blood chemistry: Secondary | ICD-10-CM | POA: Diagnosis present

## 2023-10-31 DIAGNOSIS — J90 Pleural effusion, not elsewhere classified: Secondary | ICD-10-CM | POA: Diagnosis not present

## 2023-10-31 DIAGNOSIS — Z1152 Encounter for screening for COVID-19: Secondary | ICD-10-CM

## 2023-10-31 DIAGNOSIS — Z79899 Other long term (current) drug therapy: Secondary | ICD-10-CM

## 2023-10-31 DIAGNOSIS — Q226 Hypoplastic right heart syndrome: Secondary | ICD-10-CM | POA: Diagnosis not present

## 2023-10-31 LAB — RESP PANEL BY RT-PCR (RSV, FLU A&B, COVID)  RVPGX2
Influenza A by PCR: NEGATIVE
Influenza B by PCR: NEGATIVE
Resp Syncytial Virus by PCR: NEGATIVE
SARS Coronavirus 2 by RT PCR: NEGATIVE

## 2023-10-31 LAB — BASIC METABOLIC PANEL WITH GFR
Anion gap: 12 (ref 5–15)
BUN: 10 mg/dL (ref 6–20)
CO2: 18 mmol/L — ABNORMAL LOW (ref 22–32)
Calcium: 9 mg/dL (ref 8.9–10.3)
Chloride: 108 mmol/L (ref 98–111)
Creatinine, Ser: 1.21 mg/dL (ref 0.61–1.24)
GFR, Estimated: >60 mL/min (ref >60)
Glucose, Bld: 90 mg/dL (ref 70–99)
Potassium: 3.7 mmol/L (ref 3.5–5.1)
Sodium: 138 mmol/L (ref 135–145)

## 2023-10-31 LAB — CBC
HCT: 49.6 % (ref 39.0–52.0)
Hemoglobin: 16.2 g/dL (ref 13.0–17.0)
MCH: 29.6 pg (ref 26.0–34.0)
MCHC: 32.7 g/dL (ref 30.0–36.0)
MCV: 90.5 fL (ref 80.0–100.0)
Platelets: 210 10*3/uL (ref 150–400)
RBC: 5.48 MIL/uL (ref 4.22–5.81)
RDW: 14.3 % (ref 11.5–15.5)
WBC: 5.4 10*3/uL (ref 4.0–10.5)
nRBC: 0 % (ref 0.0–0.2)

## 2023-10-31 LAB — I-STAT CG4 LACTIC ACID, ED
Lactic Acid, Venous: 0.8 mmol/L (ref 0.5–1.9)
Lactic Acid, Venous: 1 mmol/L (ref 0.5–1.9)

## 2023-10-31 LAB — TROPONIN I (HIGH SENSITIVITY)
Troponin I (High Sensitivity): 19 ng/L — ABNORMAL HIGH (ref <18)
Troponin I (High Sensitivity): 22 ng/L — ABNORMAL HIGH (ref <18)

## 2023-10-31 LAB — BRAIN NATRIURETIC PEPTIDE: B Natriuretic Peptide: 492.8 pg/mL — ABNORMAL HIGH (ref 0.0–100.0)

## 2023-10-31 MED ORDER — FENTANYL CITRATE PF 50 MCG/ML IJ SOSY: 25.0000 ug | PREFILLED_SYRINGE | INTRAMUSCULAR | Status: DC | PRN

## 2023-10-31 MED ORDER — NALOXONE HCL 0.4 MG/ML IJ SOLN: 0.4000 mg | INTRAMUSCULAR | Status: DC | PRN

## 2023-10-31 MED ORDER — SODIUM CHLORIDE 0.9 % IV BOLUS
500.0000 mL | Freq: Once | INTRAVENOUS | Status: AC
  Administered 2023-10-31: 500 mL via INTRAVENOUS

## 2023-10-31 MED ORDER — ACETAMINOPHEN 325 MG PO TABS: 650.0000 mg | ORAL_TABLET | Freq: Four times a day (QID) | ORAL | Status: DC | PRN

## 2023-10-31 MED ORDER — ACETAMINOPHEN 650 MG RE SUPP
650.0000 mg | Freq: Four times a day (QID) | RECTAL | Status: DC | PRN
Start: 2023-10-31 — End: 2023-11-01

## 2023-10-31 MED ORDER — FUROSEMIDE 10 MG/ML IJ SOLN
20.0000 mg | Freq: Two times a day (BID) | INTRAMUSCULAR | Status: DC
  Administered 2023-11-01 (×2): 20 mg via INTRAVENOUS
  Filled 2023-10-31 (×2): qty 2

## 2023-10-31 MED ORDER — FENTANYL CITRATE PF 50 MCG/ML IJ SOSY: 50.0000 ug | PREFILLED_SYRINGE | INTRAMUSCULAR | Status: DC | PRN

## 2023-10-31 MED ORDER — SACUBITRIL-VALSARTAN 24-26 MG PO TABS
1.0000 | ORAL_TABLET | Freq: Two times a day (BID) | ORAL | Status: DC
  Administered 2023-10-31 – 2023-11-01 (×2): 1 via ORAL
  Filled 2023-10-31 (×2): qty 1

## 2023-10-31 MED ORDER — POTASSIUM CHLORIDE CRYS ER 20 MEQ PO TBCR
40.0000 meq | EXTENDED_RELEASE_TABLET | Freq: Once | ORAL | Status: AC
  Administered 2023-10-31: 40 meq via ORAL
  Filled 2023-10-31: qty 2

## 2023-10-31 MED ORDER — IPRATROPIUM-ALBUTEROL 0.5-2.5 (3) MG/3ML IN SOLN
3.0000 mL | Freq: Once | RESPIRATORY_TRACT | Status: AC
  Administered 2023-10-31: 3 mL via RESPIRATORY_TRACT

## 2023-10-31 MED ORDER — HEPARIN BOLUS VIA INFUSION
5000.0000 [IU] | Freq: Once | INTRAVENOUS | Status: AC
  Administered 2023-10-31: 5000 [IU] via INTRAVENOUS
  Filled 2023-10-31: qty 5000

## 2023-10-31 MED ORDER — MELATONIN 3 MG PO TABS: 3.0000 mg | ORAL_TABLET | Freq: Every evening | ORAL | Status: DC | PRN

## 2023-10-31 MED ORDER — HEPARIN (PORCINE) 25000 UT/250ML-% IV SOLN
1300.0000 [IU]/h | INTRAVENOUS | Status: DC
  Administered 2023-10-31: 1250 [IU]/h via INTRAVENOUS
  Filled 2023-10-31: qty 250

## 2023-10-31 MED ORDER — IOHEXOL 350 MG/ML SOLN
75.0000 mL | Freq: Once | INTRAVENOUS | Status: AC | PRN
  Administered 2023-10-31: 75 mL via INTRAVENOUS

## 2023-10-31 MED ORDER — EMPAGLIFLOZIN 10 MG PO TABS
10.0000 mg | ORAL_TABLET | Freq: Every day | ORAL | Status: DC
  Administered 2023-11-01: 10 mg via ORAL
  Filled 2023-10-31: qty 1

## 2023-10-31 MED ORDER — ONDANSETRON HCL 4 MG/2ML IJ SOLN: 4.0000 mg | Freq: Four times a day (QID) | INTRAMUSCULAR | Status: DC | PRN

## 2023-10-31 MED ORDER — IPRATROPIUM-ALBUTEROL 0.5-2.5 (3) MG/3ML IN SOLN
RESPIRATORY_TRACT | Status: AC
  Filled 2023-10-31: qty 3

## 2023-10-31 NOTE — ED Notes (Signed)
 [facility] CALLED FOR TRANSPORT TO THE ED. IN ROUTE NOW.   CALLED CHARGE RN AT [facility], LEFT VOICEMAIL THAT A 27 YO MALE BEING TRANSPORTED BY [facility] DUE TO HYPOXIA CURRENTLY ON A NONREBREATHER MASK.

## 2023-10-31 NOTE — ED Triage Notes (Signed)
 Chief Complaint: emesis, racing heart rate, labored breathing. Patient was recently in the hospital for bronchitis.   Sick exposure: No  Onset: last night it got worse but ongoing since the hospital.   Prescriptions or OTC medications tried: No    New foods, medications, or products: No  Recent Travel: No

## 2023-10-31 NOTE — ED Triage Notes (Signed)
 Pt arrives [facility] from [facility] c/o SOB and CP ongoing for one week.

## 2023-10-31 NOTE — H&P (Signed)
 History and Physical      Micheal Leonard ZOX:[id number] DOB: 1995/12/22 DOA: 10/31/2023; DOS: 10/31/2023  PCP: Pcp, No (will further assess) Patient coming from: home   I have personally briefly reviewed patient's old medical records in [facility]  Chief Complaint: chest pain  HPI: Micheal Leonard is a 28 y.o. 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   The patient presents with complaint of 2 days of sharp left lateral chest discomfort, without radiation, that worsens with deep inspiration or cough.  Has been constant over the last 1 to 2 days, but denies any exacerbation with exertion.  He also notes that the chest discomfort is nonpositional, and not reproducible with palpation over the chest wall.  He has noted some associated mild shortness of breath but denies any associated palpitations, diaphoresis, nausea, vomiting, dizziness.  He also denies any associated subjective fever, chills, rigors, or generalized myalgias.  No wheezing interval worsening of edema in the lower extremities.Micheal Leonard   He confirms no known baseline supplemental oxygen requirements.  He has a history of chronic systolic heart failure, with most recent echocardiogram performed on 10/16/2023, which is notable for LVEF 25 to 30%, indeterminate diastolic parameters, severe mitral regurgitation as well as trivial mitral gravitation.  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  His medical history is also notable for pulmonary embolism diagnosed in July 2017.  Not currently on anticoagulation.    ED Course:  Vital signs in the ED were notable for  the following: Afebrile; heart rate in the low 117; systolic pressures in the 150s to 170s; respiratory rate 23-28, initial oxygen saturation 88% on room air, essentially increasing into the range of 93 to 95% on 2 L nasal cannula.  Labs were notable for the following: BMP notable for the following: Sodium 138, potassium 0.7, creatinine 1.21.  BNP 492 compared to most recent provide 138 on 10/17/2023.  High sensitive troponin I initially 19 with repeat value trending up slightly to 22, relative to high sensitive troponin I values of 32-44 during most recent prior hospitalization from 14 25-4 325.  Lactic acid 0.8.  CBC notable for white count 5400, hemoglobin 16.2.  COVID, influenza, RSV PCR were all negative.  Per my interpretation, EKG in ED demonstrated the following: In comparison to most recent prior EKG from 10/16/2023, today's EKG shows sinus tachycardia with heart rate 117, normal levels, 2 inversions in leads I, aVL, V4, V5, V6, all of which appear unchanged from most recent prior EKG, laboratory evidence of new T wave changes, also showing less than 1 mm ST depression in leads V5, V6, which also appear unchanged from his recent prior EKG, which showed no evidence of new ST changes, including evidence of ST elevation.  Imaging in the ED, per corresponding formal radiology read, was notable for the following: Compared to most recent prior CTA chest with PE protocol on 10/15/2023, today CT chest with PE protocol shows evidence of acute pulmonary embolism in the left lower lobe as well as small left pleural effusion and small to moderate right pleural effusion, with these bilateral pleural effusions noted to be increased relative to CTA chest from 4-25,.  Additionally, today CT chest shows no evidence of pulmonary edema nor any evidence of infiltrate or pneumothorax.  While in the ED, the following were administered: Heparin  bolus followed by initiation of heparin  drip.  Normal saline.  Residency  bolus.  Subsequently, the patient was admitted for further evaluation management of acute pulmonary embolism as well as acute on chronic systolic heart failure complicated by acute hypoxic respiratory status after presenting with atypical chest discomfort.     Review of Systems: As per HPI otherwise 10 point review of systems negative.   Past Medical History:  Diagnosis Date   Cardiac anomaly, congenital    Diarrhea 01/22/2017   Dizziness 05/30/2020   EBV infection    hx/notes 10/23/2016   Erectile dysfunction 05/30/2020   Gonorrhea 07/20/2019   Hemoptysis 10/23/2016   Maximo Spar 10/23/2016   HIV (human immunodeficiency virus infection) (HCC)    Hypertension    Hypertension 04/30/2021   Migraine    "a few/year now; frequency is less than in the past" (10/23/2016)   Nonadherence to medical treatment 09/08/2019   Pharyngitis 10/23/2016   notes 10/23/2016   Pulmonary embolism (HCC) 01/29/2016   S/P bidirectional Glenn shunt    hx/notes 10/23/2016   S/P Fontan procedure    hx/notes 10/23/2016   Syphilis 07/20/2019   Tricuspid atresia and stenosis, congenital     Past Surgical History:  Procedure Laterality Date   CARDIAC CATHETERIZATION  01/2016   at [facility]; hx/notes 10/23/2016   CARDIAC SURGERY  01/1997; 2000   fontane procedure at [facility] as a child     Social History:  reports that he has never smoked. He has never used smokeless tobacco. He reports that he does not currently use alcohol. He reports that he does not currently use drugs after having used the following drugs: Marijuana. Frequency: 2.00 times per week.   Allergies  Allergen Reactions   Other     An HIV drug and can not remember  exactly.    Family History  Problem Relation Age of Onset   Hypertension Father     Family history reviewed and not pertinent    Prior to Admission medications   Medication Sig Start Date End Date Taking? Authorizing Provider  aspirin  EC 81 MG tablet Take 1 tablet (81 mg total) by mouth  daily. 10/19/23   Krishnan, Gokul, MD  aspirin -acetaminophen -caffeine  (EXCEDRIN  EXTRA STRENGTH) 250-250-65 MG tablet Take 2 tablets by mouth every 6 (six) hours as needed for headache. 10/19/23   Krishnan, Gokul, MD  bismuth  subsalicylate (PEPTO BISMOL) 262 MG chewable tablet Chew 1 tablet (262 mg total) by mouth as needed for indigestion (nausea). 10/19/23   Krishnan, Gokul, MD  cabotegravir  & rilpivirine  ER (CABENUVA ) 600 & 900 MG/3ML injection Inject 1 kit into the muscle every 2 (two) months. 12/02/22   Sonya Duster, RPH-CPP  empagliflozin  (JARDIANCE ) 10 MG TABS tablet Take 1 tablet (10 mg total) by mouth daily. 10/19/23   Krishnan, Gokul, MD  fexofenadine  (ALLEGRA ) 180 MG tablet Take 1 tablet (180 mg total) by mouth daily. 10/19/23   Krishnan, Gokul, MD  sacubitril -valsartan  (ENTRESTO ) 24-26 MG Take 1 tablet by mouth 2 (two) times daily. 10/19/23   Krishnan, Gokul, MD  spironolactone  (ALDACTONE ) 25 MG tablet Take 1 tablet (25 mg total) by mouth daily. 10/19/23   Maylene Spear, MD     Objective    Physical Exam: Vitals:   10/31/23 2030 10/31/23 2036 10/31/23 2130 10/31/23 2200  BP:  (!) 159/90 (!) 167/100  Pulse: (!) 109 (!) 109 (!) 107   Resp: (!) 27 (!) 28 (!) 23   Temp:  98.9 F (37.2 C)    TempSrc:  Oral    SpO2: 96% 95% 93%   Weight:    90.8 kg  Height:    6\' 1"  (1.854 m)    General: appears to be stated age; alert, oriented Skin: warm, dry, no rash Head:  AT/[place] Mouth:  Oral mucosa membranes appear moist, normal dentition Neck: supple; trachea midline Heart: Tachycardic, but regular; did not appreciate any M/R/G Lungs: CTAB, did not appreciate any wheezes, rales, or rhonchi Abdomen: + BS; soft, ND, NT Vascular: 2+ pedal pulses b/l; 2+ radial pulses b/l Extremities: In the bilateral lower extremities, no muscle wasting Neuro: strength and sensation intact in upper and lower extremities b/l    Labs on Admission: I have personally reviewed following labs and imaging  studies  CBC: Recent Labs  Lab 10/31/23 1640  WBC 5.4  HGB 16.2  HCT 49.6  MCV 90.5  PLT 210   Basic Metabolic Panel: Recent Labs  Lab 10/31/23 1640  NA 138  K 3.7  CL 108  CO2 18*  GLUCOSE 90  BUN 10  CREATININE 1.21  CALCIUM 9.0   GFR: Estimated Creatinine Clearance: 104.6 mL/min (by C-G formula based on SCr of 1.21 mg/dL). Liver Function Tests: No results for input(s): "AST", "ALT", "ALKPHOS", "BILITOT", "PROT", "ALBUMIN" in the last 168 hours. No results for input(s): "LIPASE", "AMYLASE" in the last 168 hours. No results for input(s): "AMMONIA" in the last 168 hours. Coagulation Profile: No results for input(s): "INR", "PROTIME" in the last 168 hours. Cardiac Enzymes: No results for input(s): "CKTOTAL", "CKMB", "CKMBINDEX", "TROPONINI" in the last 168 hours. BNP (last 3 results) No results for input(s): "PROBNP" in the last 8760 hours. HbA1C: No results for input(s): "HGBA1C" in the last 72 hours. CBG: No results for input(s): "GLUCAP" in the last 168 hours. Lipid Profile: No results for input(s): "CHOL", "HDL", "LDLCALC", "TRIG", "CHOLHDL", "LDLDIRECT" in the last 72 hours. Thyroid Function Tests: No results for input(s): "TSH", "T4TOTAL", "FREET4", "T3FREE", "THYROIDAB" in the last 72 hours. Anemia Panel: No results for input(s): "VITAMINB12", "FOLATE", "FERRITIN", "TIBC", "IRON", "RETICCTPCT" in the last 72 hours. Urine analysis:    Component Value Date/Time   COLORURINE YELLOW 10/16/2023 1800   APPEARANCEUR CLEAR 10/16/2023 1800   LABSPEC 1.025 10/16/2023 1800   PHURINE 6.0 10/16/2023 1800   GLUCOSEU NEGATIVE 10/16/2023 1800   HGBUR TRACE (A) 10/16/2023 1800   BILIRUBINUR NEGATIVE 10/16/2023 1800   KETONESUR NEGATIVE 10/16/2023 1800   PROTEINUR 100 (A) 10/16/2023 1800   NITRITE NEGATIVE 10/16/2023 1800   LEUKOCYTESUR NEGATIVE 10/16/2023 1800    Radiological Exams on Admission: CT Angio Chest PE W and/or Wo Contrast Result Date:  10/31/2023 CLINICAL DATA:  Chest pain EXAM: CT ANGIOGRAPHY CHEST WITH CONTRAST TECHNIQUE: Multidetector CT imaging of the chest was performed using the standard protocol during bolus administration of intravenous contrast. Multiplanar CT image reconstructions and MIPs were obtained to evaluate the vascular anatomy. RADIATION DOSE REDUCTION: This exam was performed according to the departmental dose-optimization program which includes automated exposure control, adjustment of the mA and/or kV according to patient size and/or use of iterative reconstruction technique. CONTRAST:  75mL OMNIPAQUE  IOHEXOL  350 MG/ML SOLN COMPARISON:  Chest x-ray 10/31/2023, chest CT 10/15/2023, 01/29/2016 FINDINGS: Cardiovascular: Postsurgical changes consistent with prior Allison Ivory and Fontan procedures with direct communication of SVC and IVC to the pulmonary arterial system on the right  side. Only partial opacification of the Fontan conduit. Appearance similar to the prior exams. Very heterogeneous opacification of the pulmonary arterial system, mainly the main and lobar and proximal segmental pulmonary vessels. More homogeneous opacification of the peripheral pulmonary vessels. Suspected filling defect within left lower lobe subsegmental vessels, series 8 image 173 through 197. Enlarged left ventricle and hypoplastic appearing right ventricle. No pericardial effusion. Mediastinum/Nodes: Patent trachea. No thyroid mass. No suspicious lymph nodes. Esophagus is unremarkable Lungs/Pleura: Small left and small moderate right pleural effusions which are increased compared to prior. No pneumothorax. Passive atelectasis in the lower lobes. Upper Abdomen: No acute finding Musculoskeletal: No acute osseous abnormality. Review of the MIP images confirms the above findings. IMPRESSION: 1. Postsurgical changes consistent with prior Allison Ivory and Fontan procedures. Very heterogeneous opacification of the pulmonary arterial system, mainly the main, lobar and  proximal segmental pulmonary vessels which highly limits assessment for acute pulmonary embolus. More homogeneous opacification of the peripheral pulmonary vessels. Suspected filling defect/pulmonary emboli within left lower lobe subsegmental vessels. 2. Small left and small to moderate right pleural effusions which are increased compared to prior. Passive atelectasis in the lower lobes. Critical Value/emergent results were called by telephone at the time of interpretation on 10/31/2023 at 9:58 pm to provider [facility] , who verbally acknowledged these results. Electronically Signed   By: Esmeralda Hedge M.D.   On: 10/31/2023 21:58   DG Chest 2 View Result Date: 10/31/2023 CLINICAL DATA:  Chest pain EXAM: CHEST - 2 VIEW COMPARISON:  X-ray 10/16/2023 and older.  CTA 10/15/2023. FINDINGS: Stable cardiopericardial silhouette. Prominent central vasculature. No consolidation, pneumothorax or effusion. No edema. Films are under penetrated. Overlapping cardiac leads. Metallic implanted device is along the left hemithorax. Patient a history of a previous Fontan procedure. IMPRESSION: No significant interval change. Electronically Signed   By: Adrianna Horde M.D.   On: 10/31/2023 18:14      Assessment/Plan   Principal Problem:   Acute pulmonary embolism (HCC) Active Problems:   HIV (human immunodeficiency virus infection) (HCC)   Acute hypoxic respiratory failure (HCC)   Elevated troponin   Acute on chronic systolic CHF (congestive heart failure) (HCC)   Essential hypertension   Atypical chest pain     #) Acute Pulmonary Embolism: In the context of presenting pleuritic chest pain associated with sob, cough, with CTA chest demonstarting evidence of an acute PE involving the LLL, without this is also associated with acute hypoxic respiratory distress, as further, found below.  Radiographic evidence of right heart strain.  No evidence of associated hypotension to warrant consideration for TPA  administration.  This appears to be the patient's second pulmonary emboli, noting also a history of pulmonary embolism in July 2017.  Not currently on anticoagulation.  No obvious provoking factors.  Heparin  drip was started in the emergency department this evening.  Of note, the patient has recently undergone venous ultrasound of the bilateral lower extremities during the first week of April, which showed no evidence of acute DVT.  Consequently, we will refrain from repeating these ultrasound studies at the present time.  Additionally, the patient underwent echocardiogram on 10/16/2023.  We will consequently, refrain from repeating echo at this time.  mildly elevated troponin appears consistent with finding of acute PE, as well as a consequence of clinical evidence of acute on chronic systolic heart failure, and ACS is felt to be less likely at this time. Chest pain has been pleuritic in nature, non-exertional, and EKG demonstrates no evidence of acute  ischemic changes.     Plan: continue Heparin  drip. Monitor on telemetry. Monitor for development of hypotension. Prn supplemental O2 in order to maintain oxygen saturations greater than or equal to 92%. Prn iv fentanyl . Recheck CBC in the morning.                      #) Acute on chronic systolic heart failure: dx of acute decompensation on the basis of presenting 2 days of progressive shortness of breath associated with worsening peripheral edema, a 3 fold interval increase in BNP, with CTA chest showing interval increase in size of bilateral pleural effusions. This is in the context of a known history of chronic systolic heart failure, with most recent echocardiogram performed on 10/16/2023, which was notable for LVEF 25 to 30%, severe mitral regurgitation, with additional results as conveyed above. Etiology leading to presenting acutely decompensated heart failure is not entirely clear at this time.   I suspect that mildly elevated initial  troponin is a consequence of underlying acutely decompensated heart failure as opposed to representing ACS causing presenting acute heart failure exacerbation, as well as an additional supply/demand mismatch contribution from presenting acute pulmonary embolism, as further detailed above, particular noting the absence of any typical appearing chest pain, with presenting EKG showing no evidence of acute ischemic changes relative to most recent prior EKG, as further detailed above.  Outpatient medical management, including goal-directed therapy include Entresto , spironolactone ,  empagliflozin .  While the patient is at risk for development of preload dependent pathophysiology in the context of his acute pulmonary embolism, it appears that he woul benefit from gentle IV diuresis.  Will start conservatively withd Lasix  20 mg IV twice daily, and closely monitor ensuing volume status as well as considering vital signs.  In this context, will hold home spironolactone  for now.  It is noted that the patient is not currently on a beta-blocker at home.   Plan: monitor strict I's & O's and daily weights. Monitor on telemetry, including trend in HR in response to diuresis, as above. Repeat BMP in the morning, including for monitoring trend of potassium, bicarbonate, and renal function in response to interval diuresis efforts. Add-on serum magnesium  level, and repeat this level in the AM.  Potassium chloride  40 mill equivalents p.o. x 1 dose now.  Close monitoring of ensuing blood pressure response to diuresis efforts, including to help guide need for improvement in afterload reduction in order to optimize cardiac output. Trend troponin. Lasix  20 mg IV twice daily, first dose now. Continue home Entresto  as well as empagliflozin .  Hold spironolactone  for now.  Repeat BMP in the morning.                        #) Acute hypoxic respiratory distress: in the context of acute respiratory symptoms and no  known baseline supplemental O2 requirements, presenting O2 sat in the high 80s on room air, [place] improving into the mid 90s on 2 L nasal cannula, thereby meeting criteria for acute hypoxic respiratory distress as opposed to acute hypoxic respiratory failure at this time. Appears to be multifactorial in etiology, with suspected contributions from CTA chest showing evidence of acute pulmonary embolism involving the left lower lobe, as well as suspected additional contribution from acute on chronic systolic heart failure in the setting of shortness of breath, worsening of peripheral edema, threefold increase in BNP, as well as CTA chest showing interval worsening of bilateral pleural effusions. No known  chronic underlying pulmonary conditions .  CTA chest shows no evidence of infiltrate to suggest pneumonia, nor any evidence of pneumothorax.  Additionally, COVID, influenza, RSV PCR are all negative.  Suspect that mildly elevated troponin is on the basis of supply demand mismatch resulting from the decrease in oxygen delivery capacity that is a/w presenting acute hypoxic respiratory distress as opposed to representing a type I process due to acute plaque rupture, particularly given presenting ekg that shows no e/o acute ischemic changes, including no evidence of STEMI. Additional type II supply/demand mismatch contributions towards the patient's very mildly elevated troponin level, which is actually lower than troponin range during most recent hospitalization, include contribution from acute pulmonary embolism as well as from the patient's acute on chronic systolic heart failure exacerbation itself. Overall, ACS is felt to be less likely relative to type 2 supply demand mismatch.   Plan: further evaluation/management of presenting acute pulmonary embolism via heparin  drip, as above as well as further evaluation management of suspected acute on chronic systolic heart failure, including pursuit of IV Lasix , as  further detailed above. monitor on telemetry. CMP/CBC in the AM. Check serum Mg and Phos levels.  Add on procalcitonin level.  Repeat BMP in the morning.                     #) HIV: Documented history of such. follows with Dr. Ernie Heal as his outpatient ID physician. On IM Cabenuva  injections every 2 month as outpatient. most recent CD4 count was checked in January 2025  and notable for 330, while his most recent viral load was found to be undetectable in January 2025.    Plan: Ongoing management per outpatient infectious disease management.                     #) Essential Hypertension: documented h/o such, with outpatient antihypertensive regimen including Entresto , spironolactone .  SBP's in the ED today: 150s to 170s mmHg, without any evidence of hypotension, which is notable in the context of his presenting acute pulmonary embolism.  He has not had his Entresto  yet today.  Will resume his home Entresto  at this time improvement in afterload reduction, particular in the context of his acute on chronic systolic heart failure exacerbation, for which we will also pursue gentle IV diuresis with Lasix .  Plan: Close monitoring of subsequent BP via routine VS. resume Entresto , first dose now.  Lasix  20 mg IV twice daily, first dose now.  Hold home spironolactone  for now.  Monitor strict I's and O's and daily weights.  Monitor on telemetry.    DVT prophylaxis: Heparin  drip, as above  Code Status: Full code Family Communication: none Disposition Plan: Per Rounding Team Consults called: none;  Admission status: inpatient     I SPENT GREATER THAN 75  MINUTES IN CLINICAL CARE TIME/MEDICAL DECISION-MAKING IN COMPLETING THIS ADMISSION.      Roselin Wiemann B Matsuko Kretz DO [facility]  From 7PM - 7AM   10/31/2023, 10:45 PM

## 2023-10-31 NOTE — ED Notes (Signed)
 Pt ambulated to bathroom independently without difficulty or complaints. Tolerated activity well.

## 2023-10-31 NOTE — ED Notes (Signed)
 Patient is being discharged from the Urgent Care and sent to the Emergency Department via [facility] . Per Chauncy Coral PA, patient is in need of higher level of care due to hypoxia and tachycardia. Patient is aware and verbalizes understanding of plan of care.  Vitals:   10/31/23 1557 10/31/23 1558  BP:    Pulse: (!) 113 (!) 113  Resp: (!) 22 (!) 22  Temp:    SpO2: (!) 89% 94%

## 2023-10-31 NOTE — Progress Notes (Signed)
 PHARMACY - ANTICOAGULATION CONSULT NOTE  Pharmacy Consult for IV heparin  Indication: chest pain/ACS  Allergies  Allergen Reactions   Other     An HIV drug and can not remember  exactly.    Patient Measurements: Height: 6\' 1"  (185.4 cm) Weight: 90.8 kg (200 lb 2.8 oz) IBW/kg (Calculated) : 79.9 HEPARIN  DW (KG): 90.8  Vital Signs: Temp: 98.9 F (37.2 C) (04/18 2036) Temp Source: Oral (04/18 2036) BP: 159/90 (04/18 2036) Pulse Rate: 109 (04/18 2036)  Labs: Recent Labs    10/31/23 1640 10/31/23 1859  HGB 16.2  --   HCT 49.6  --   PLT 210  --   CREATININE 1.21  --   TROPONINIHS 19* 22*    Estimated Creatinine Clearance: 104.6 mL/min (by C-G formula based on SCr of 1.21 mg/dL).   Medical History: Past Medical History:  Diagnosis Date   Cardiac anomaly, congenital    Diarrhea 01/22/2017   Dizziness 05/30/2020   EBV infection    hx/notes 10/23/2016   Erectile dysfunction 05/30/2020   Gonorrhea 07/20/2019   Hemoptysis 10/23/2016   Maximo Spar 10/23/2016   HIV (human immunodeficiency virus infection) (HCC)    Hypertension    Hypertension 04/30/2021   Migraine    "a few/year now; frequency is less than in the past" (10/23/2016)   Nonadherence to medical treatment 09/08/2019   Pharyngitis 10/23/2016   notes 10/23/2016   Pulmonary embolism (HCC) 01/29/2016   S/P bidirectional Glenn shunt    hx/notes 10/23/2016   S/P Fontan procedure    hx/notes 10/23/2016   Syphilis 07/20/2019   Tricuspid atresia and stenosis, congenital    Assessment: Micheal Leonard is a 28 y.o. year old male admitted on 10/31/2023 with concern for PE. Remote history of PE in 2017, no anticoagulation prior to admission. Pharmacy consulted to dose heparin .  Goal of Therapy  Heparin  level 0.3-0.7 units/ml Monitor platelets by anticoagulation protocol: Yes   Plan:  Heparin  5000 units x 1 as bolus followed by heparin  infusion at 1250 units/hr 6 heparin  level  Daily heparin  level, CBC, and monitoring for  bleeding F/u plans for anticoagulation   Thank you for allowing pharmacy to participate in this patient's care.  Fonda Hymen, PharmD Emergency Medicine Clinical Pharmacist 10/31/2023,10:20 PM

## 2023-10-31 NOTE — ED Provider Notes (Signed)
 [place] [facility] DEPARTMENT AT [facility] Provider Note   CSN: [id number] Arrival date & time: 10/31/23  1626     History  Chief Complaint  Patient presents with   Chest Pain   Shortness of Breath    Micheal Leonard is a 28 y.o. male.  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 He remains afebrile. He reports he is not eating or drinking well and has had some vomiting without diarrhea.   The history is provided by the patient. No language interpreter was used.  Chest Pain Associated symptoms: shortness of breath   Shortness of Breath Associated symptoms: chest pain        Home Medications Prior to Admission medications   Medication Sig Start Date End Date Taking? Authorizing Provider  aspirin  EC 81 MG tablet Take 1 tablet (81 mg total) by mouth daily. 10/19/23   Krishnan, Gokul, MD  aspirin -acetaminophen -caffeine  (EXCEDRIN  EXTRA STRENGTH) 250-250-65 MG tablet Take 2 tablets by mouth every 6 (six) hours as needed for headache. 10/19/23   Krishnan, Gokul, MD  bismuth  subsalicylate (PEPTO BISMOL) 262 MG chewable tablet Chew 1 tablet (262 mg total) by mouth as needed for indigestion (nausea). 10/19/23   Krishnan, Gokul, MD  cabotegravir  & rilpivirine  ER (CABENUVA ) 600 & 900 MG/3ML injection Inject 1 kit into the muscle every 2 (two) months. 12/02/22   Sonya Duster, RPH-CPP  empagliflozin  (JARDIANCE ) 10 MG TABS tablet Take 1 tablet (10 mg total) by mouth daily. 10/19/23   Krishnan, Gokul, MD  fexofenadine  (ALLEGRA ) 180 MG tablet Take 1 tablet (180 mg total) by mouth daily. 10/19/23   Krishnan, Gokul, MD  sacubitril -valsartan  (ENTRESTO ) 24-26 MG Take 1 tablet by mouth 2 (two) times daily. 10/19/23    Krishnan, Gokul, MD  spironolactone  (ALDACTONE ) 25 MG tablet Take 1 tablet (25 mg total) by mouth daily. 10/19/23   Maylene Spear, MD      Allergies    Other    Review of Systems   Review of Systems  Respiratory:  Positive for shortness of breath.   Cardiovascular:  Positive for chest pain.    Physical Exam Updated Vital Signs BP (!) 159/90 (BP Location: Right Arm)   Pulse (!) 109   Temp 98.9 F (37.2 C) (Oral)   Resp (!) 28   Ht 6\' 1"  (1.854 m)   Wt 90.8 kg   SpO2 95%   BMI 26.41 kg/m  Physical Exam Vitals and nursing note reviewed.  Constitutional:      General: He is not in acute distress.    Appearance: He is well-developed. He is not ill-appearing.  Cardiovascular:     Rate and Rhythm: Tachycardia present.     Heart sounds: Murmur heard.  Pulmonary:     Effort: Pulmonary effort is normal. Tachypnea present.     Comments: Active, frequent, dry cough Abdominal:     Palpations: Abdomen is soft.     Tenderness: There is no abdominal tenderness.  Musculoskeletal:     Cervical back: Normal range of motion.     Right lower leg: No edema.     Left lower leg: No edema.  Skin:    General: Skin is warm and dry.  Neurological:     Mental Status: He is alert.  ED Results / Procedures / Treatments   Labs (all labs ordered are listed, but only abnormal results are displayed) Labs Reviewed  BASIC METABOLIC PANEL WITH GFR - Abnormal; Notable for the following components:      Result Value   CO2 18 (*)    All other components within normal limits  BRAIN NATRIURETIC PEPTIDE - Abnormal; Notable for the following components:   B Natriuretic Peptide 492.8 (*)    All other components within normal limits  TROPONIN I (HIGH SENSITIVITY) - Abnormal; Notable for the following components:   Troponin I (High Sensitivity) 19 (*)    All other components within normal limits  TROPONIN I (HIGH SENSITIVITY) - Abnormal; Notable for the following components:   Troponin I (High  Sensitivity) 22 (*)    All other components within normal limits  RESP PANEL BY RT-PCR (RSV, FLU A&B, COVID)  [id number]  CBC  I-STAT CG4 LACTIC ACID, ED  I-STAT CG4 LACTIC ACID, ED   Results for orders placed or performed during the hospital encounter of 10/31/23  Basic metabolic panel   Collection Time: 10/31/23  4:40 PM  Result Value Ref Range   Sodium 138 135 - 145 mmol/L   Potassium 3.7 3.5 - 5.1 mmol/L   Chloride 108 98 - 111 mmol/L   CO2 18 (L) 22 - 32 mmol/L   Glucose, Bld 90 70 - 99 mg/dL   BUN 10 6 - 20 mg/dL   Creatinine, Ser 1.19 0.61 - 1.24 mg/dL   Calcium 9.0 8.9 - 14.7 mg/dL   GFR, Estimated >82 >95 mL/min   Anion gap 12 5 - 15  CBC   Collection Time: 10/31/23  4:40 PM  Result Value Ref Range   WBC 5.4 4.0 - 10.5 K/uL   RBC 5.48 4.22 - 5.81 MIL/uL   Hemoglobin 16.2 13.0 - 17.0 g/dL   HCT 62.1 30.8 - 65.7 %   MCV 90.5 80.0 - 100.0 fL   MCH 29.6 26.0 - 34.0 pg   MCHC 32.7 30.0 - 36.0 g/dL   RDW 84.6 96.2 - 95.2 %   Platelets 210 150 - 400 K/uL   nRBC 0.0 0.0 - 0.2 %  Brain natriuretic peptide   Collection Time: 10/31/23  4:40 PM  Result Value Ref Range   B Natriuretic Peptide 492.8 (H) 0.0 - 100.0 pg/mL  Troponin I (High Sensitivity)   Collection Time: 10/31/23  4:40 PM  Result Value Ref Range   Troponin I (High Sensitivity) 19 (H) <18 ng/L  Resp panel by RT-PCR (RSV, Flu A&B, Covid) Anterior Nasal Swab   Collection Time: 10/31/23  5:22 PM   Specimen: Anterior Nasal Swab  Result Value Ref Range   SARS Coronavirus 2 by RT PCR NEGATIVE NEGATIVE   Influenza A by PCR NEGATIVE NEGATIVE   Influenza B by PCR NEGATIVE NEGATIVE   Resp Syncytial Virus by PCR NEGATIVE NEGATIVE  Troponin I (High Sensitivity)   Collection Time: 10/31/23  6:59 PM  Result Value Ref Range   Troponin I (High Sensitivity) 22 (H) <18 ng/L  I-Stat CG4 Lactic Acid   Collection Time: 10/31/23  8:36 PM  Result Value Ref Range   Lactic Acid, Venous 0.8 0.5 - 1.9 mmol/L     EKG EKG  Interpretation Date/Time:  Friday October 31 2023 16:31:07 EDT Ventricular Rate:  117 PR Interval:  125 QRS Duration:  108 QT Interval:  338 QTC Calculation: 472 R Axis:   -23  Text Interpretation: Sinus tachycardia Probable LVH  with secondary repol abnrm Borderline prolonged QT interval Nonspecific ST and T wave abnormality No significant change since last tracing Confirmed by Deatra Face [phone]) on 10/31/2023 5:25:24 PM  Radiology CT Angio Chest PE W and/or Wo Contrast Result Date: 10/31/2023 CLINICAL DATA:  Chest pain EXAM: CT ANGIOGRAPHY CHEST WITH CONTRAST TECHNIQUE: Multidetector CT imaging of the chest was performed using the standard protocol during bolus administration of intravenous contrast. Multiplanar CT image reconstructions and MIPs were obtained to evaluate the vascular anatomy. RADIATION DOSE REDUCTION: This exam was performed according to the departmental dose-optimization program which includes automated exposure control, adjustment of the mA and/or kV according to patient size and/or use of iterative reconstruction technique. CONTRAST:  75mL OMNIPAQUE  IOHEXOL  350 MG/ML SOLN COMPARISON:  Chest x-ray 10/31/2023, chest CT 10/15/2023, 01/29/2016 FINDINGS: Cardiovascular: Postsurgical changes consistent with prior Allison Ivory and Fontan procedures with direct communication of SVC and IVC to the pulmonary arterial system on the right side. Only partial opacification of the Fontan conduit. Appearance similar to the prior exams. Very heterogeneous opacification of the pulmonary arterial system, mainly the main and lobar and proximal segmental pulmonary vessels. More homogeneous opacification of the peripheral pulmonary vessels. Suspected filling defect within left lower lobe subsegmental vessels, series 8 image 173 through 197. Enlarged left ventricle and hypoplastic appearing right ventricle. No pericardial effusion. Mediastinum/Nodes: Patent trachea. No thyroid mass. No suspicious lymph nodes.  Esophagus is unremarkable Lungs/Pleura: Small left and small moderate right pleural effusions which are increased compared to prior. No pneumothorax. Passive atelectasis in the lower lobes. Upper Abdomen: No acute finding Musculoskeletal: No acute osseous abnormality. Review of the MIP images confirms the above findings. IMPRESSION: 1. Postsurgical changes consistent with prior Allison Ivory and Fontan procedures. Very heterogeneous opacification of the pulmonary arterial system, mainly the main, lobar and proximal segmental pulmonary vessels which highly limits assessment for acute pulmonary embolus. More homogeneous opacification of the peripheral pulmonary vessels. Suspected filling defect/pulmonary emboli within left lower lobe subsegmental vessels. 2. Small left and small to moderate right pleural effusions which are increased compared to prior. Passive atelectasis in the lower lobes. Critical Value/emergent results were called by telephone at the time of interpretation on 10/31/2023 at 9:58 pm to provider [facility] Brainard Highfill , who verbally acknowledged these results. Electronically Signed   By: Esmeralda Hedge M.D.   On: 10/31/2023 21:58   DG Chest 2 View Result Date: 10/31/2023 CLINICAL DATA:  Chest pain EXAM: CHEST - 2 VIEW COMPARISON:  X-ray 10/16/2023 and older.  CTA 10/15/2023. FINDINGS: Stable cardiopericardial silhouette. Prominent central vasculature. No consolidation, pneumothorax or effusion. No edema. Films are under penetrated. Overlapping cardiac leads. Metallic implanted device is along the left hemithorax. Patient a history of a previous Fontan procedure. IMPRESSION: No significant interval change. Electronically Signed   By: Adrianna Horde M.D.   On: 10/31/2023 18:14    Procedures .Critical Care  Performed by: Mandy Second, PA-C Authorized by: Mandy Second, PA-C   Critical care provider statement:    Critical care time (minutes):  40   Critical care was necessary to treat or prevent imminent or  life-threatening deterioration of the following conditions:  Respiratory failure   Critical care was time spent personally by me on the following activities:  Discussions with consultants, examination of patient, obtaining history from patient or surrogate, ordering and review of laboratory studies, ordering and review of radiographic studies, pulse oximetry, re-evaluation of patient's condition and review of old charts     Medications Ordered in ED Medications  iohexol  (  OMNIPAQUE ) 350 MG/ML injection 75 mL (75 mLs Intravenous Contrast Given 10/31/23 1922)  sodium chloride  0.9 % bolus 500 mL (0 mLs Intravenous Stopped 10/31/23 2206)    ED Course/ Medical Decision Making/ A&P                                 Medical Decision Making This patient presents to the ED for concern of chest pain, this involves an extensive number of treatment options, and is a complaint that carries with it a high risk of complications and morbidity.  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  Elevated troponins felt to be minimal HIV with last CD4 330, RNA undetectable.    Lab Tests:  I Ordered, and personally interpreted labs.  The pertinent results include:  Troponin 19, 22; CO2 19 otherwise no electrolyte abnormalities; viral panel negative; WBC 5.4, normal hgb    Imaging Studies ordered:  I ordered imaging studies including CXR Per radiologist interpretation:  no interval change CTA PE per radiology:   IMPRESSION: 1. Postsurgical changes consistent with prior Allison Ivory and Fontan procedures. Very heterogeneous opacification of the pulmonary arterial system, mainly the main, lobar and proximal segmental pulmonary vessels which highly limits assessment for acute pulmonary embolus. More homogeneous opacification of the peripheral pulmonary vessels. Suspected filling defect/pulmonary emboli within left lower lobe subsegmental vessels. 2. Small left and small to moderate right pleural effusions which are increased compared to prior. Passive atelectasis in the lower lobes.    Cardiac Monitoring:  The patient was maintained on a cardiac monitor.  I personally viewed and interpreted the cardiac monitored which showed an underlying rhythm of: sinus tachycardia, no significant interval change    Test Considered:  N/a   Critical Interventions:  Heparin  per pharmacy for PE   Consultations Obtained:  I requested consultation with the n/a,  and discussed lab and imaging findings as well as pertinent plan - they recommend: n/a   Problem List / ED Course:  Persistent worsening cough, now with chest pain with cough Recent admission for similar symptoms, dx: viral bronchitis He is persistently tachycardic, hypoxic, tachypneic - repeat CTA PE felt indicated  CTA PE c/w PE per radiology Heparin  started  Admit to hospitalist   Reevaluation:  After the interventions noted above, I reevaluated the patient and found that they have :stayed the same     Disposition:  After consideration of the diagnostic results and the patients response to treatment, I feel that the patient would benefit from admit.   Amount and/or Complexity of Data Reviewed Labs: ordered. Radiology: ordered.  Risk Prescription drug management. Decision regarding hospitalization.           Final Clinical Impression(s) / ED Diagnoses Final diagnoses:   Single subsegmental pulmonary embolism without acute cor pulmonale [facility])    Rx / DC Orders ED Discharge Orders     None         Mandy Second, PA-C 10/31/23 2220    Nanavati, Ankit,  MD 11/01/23 1800

## 2023-10-31 NOTE — ED Notes (Signed)
 Provider notified and currently at bedside.

## 2023-10-31 NOTE — ED Provider Notes (Signed)
 [facility]    CSN: [id number] Arrival date & time: 10/31/23  1450      History   Chief Complaint Chief Complaint  Patient presents with   Emesis   Shortness of Breath    HPI Micheal Leonard is a 28 y.o. male.   Patient presents today with a several day history of recurrent URI symptoms.  He was recently hospitalized from 10/15/2023 to 10/19/2023 for acute respiratory failure secondary to viral bronchitis.  He was discharged home without supplemental oxygen.  He denies history of pulmonary disease including asthma or COPD.  He does have a history of HIV but reports compliance with his medication.  He was not feeling well for over a week but symptoms significantly worsened overnight.  He denies any recent antibiotics or steroids in the past 90 days.  Denies any known sick contacts.  He does have a history of CHF with reduced ejection fraction and was encouraged to follow-up with his cardiologist when he was discharged from the hospital.    Past Medical History:  Diagnosis Date   Cardiac anomaly, congenital    Diarrhea 01/22/2017   Dizziness 05/30/2020   EBV infection    hx/notes 10/23/2016   Erectile dysfunction 05/30/2020   Gonorrhea 07/20/2019   Hemoptysis 10/23/2016   Maximo Spar 10/23/2016   HIV (human immunodeficiency virus infection) (HCC)    Hypertension    Hypertension 04/30/2021   Migraine    "a few/year now; frequency is less than in the past" (10/23/2016)   Nonadherence to medical treatment 09/08/2019   Pharyngitis 10/23/2016   notes 10/23/2016   Pulmonary embolism (HCC) 01/29/2016   S/P bidirectional Glenn shunt    hx/notes 10/23/2016   S/P Fontan procedure    hx/notes 10/23/2016   Syphilis 07/20/2019   Tricuspid atresia and stenosis, congenital     Patient Active Problem List   Diagnosis Date Noted   Acute hypoxic respiratory failure (HCC) 10/16/2023   Acute bronchitis 10/16/2023   Elevated troponin 10/16/2023   Chronic systolic CHF (congestive heart  failure) (HCC) 10/16/2023   Essential hypertension 10/16/2023   Allergic rhinitis 10/16/2023   SOB (shortness of breath) 10/15/2023   H/O of bidirectional Glenn procedure 04/30/2021   Dizziness 05/30/2020   Erectile dysfunction 05/30/2020   Nonadherence to medical treatment 09/08/2019   Syphilis 07/20/2019   Gonorrhea 07/20/2019   Diarrhea 01/22/2017   Bleeding gums 11/02/2016   Sinus tachycardia 11/02/2016   Cognitive developmental delay 10/25/2016   Acute pulmonary embolism (HCC) 10/24/2016   HIV (human immunodeficiency virus infection) (HCC) 10/24/2016   Chlamydial infection of pharynx 10/23/2016   S/P Fontan procedure 10/23/2016   S/P bidirectional Glenn shunt 10/23/2016   Tricuspid atresia 10/23/2016   Proteinuria 10/23/2016    Past Surgical History:  Procedure Laterality Date   CARDIAC CATHETERIZATION  01/2016   at [facility]; hx/notes 10/23/2016   CARDIAC SURGERY  01/1997; 2000   fontane procedure at [facility] as a child        Home Medications    Prior to Admission medications   Medication Sig Start Date End Date Taking? Authorizing Provider  aspirin  EC 81 MG tablet Take 1 tablet (81 mg total) by mouth daily. 10/19/23  Yes Krishnan, Gokul, MD  aspirin -acetaminophen -caffeine  (EXCEDRIN  EXTRA STRENGTH) 250-250-65 MG tablet Take 2 tablets by mouth every 6 (six) hours as needed for headache. 10/19/23  Yes Krishnan, Gokul, MD  bismuth  subsalicylate (PEPTO BISMOL) 262 MG chewable tablet Chew 1 tablet (262 mg total) by mouth as needed for  indigestion (nausea). 10/19/23  Yes Krishnan, Gokul, MD  empagliflozin  (JARDIANCE ) 10 MG TABS tablet Take 1 tablet (10 mg total) by mouth daily. 10/19/23  Yes Krishnan, Gokul, MD  fexofenadine  (ALLEGRA ) 180 MG tablet Take 1 tablet (180 mg total) by mouth daily. 10/19/23  Yes Krishnan, Gokul, MD  sacubitril -valsartan  (ENTRESTO ) 24-26 MG Take 1 tablet by mouth 2 (two) times daily. 10/19/23  Yes Krishnan, Gokul, MD  spironolactone  (ALDACTONE ) 25 MG tablet Take 1  tablet (25 mg total) by mouth daily. 10/19/23  Yes Krishnan, Gokul, MD  cabotegravir  & rilpivirine  ER (CABENUVA ) 600 & 900 MG/3ML injection Inject 1 kit into the muscle every 2 (two) months. 12/02/22   Sonya Duster, RPH-CPP    Family History Family History  Problem Relation Age of Onset   Hypertension Father     Social History Social History   Tobacco Use   Smoking status: Never   Smokeless tobacco: Never  Vaping Use   Vaping status: Never Used  Substance Use Topics   Alcohol use: Not Currently    Comment: 2 GLASSES A WEEK   Drug use: Not Currently    Frequency: 2.0 times per week    Types: Marijuana     Allergies   Other   Review of Systems Review of Systems  Constitutional:  Positive for activity change. Negative for appetite change, fatigue and fever.  Respiratory:  Positive for chest tightness and shortness of breath. Negative for cough.   Cardiovascular:  Positive for palpitations. Negative for chest pain.  Gastrointestinal:  Positive for nausea and vomiting. Negative for abdominal pain and diarrhea.     Physical Exam Triage Vital Signs ED Triage Vitals  Encounter Vitals Group     BP 10/31/23 1528 (!) 155/112     Systolic BP Percentile --      Diastolic BP Percentile --      Pulse Rate 10/31/23 1528 (!) 113     Resp 10/31/23 1528 (!) 22     Temp 10/31/23 1528 98.1 F (36.7 C)     Temp Source 10/31/23 1528 Oral     SpO2 10/31/23 1528 (!) 83 %     Weight --      Height --      Head Circumference --      Peak Flow --      Pain Score 10/31/23 1527 7     Pain Loc --      Pain Education --      Exclude from Growth Chart --    No data found.  Updated Vital Signs BP (!) 155/112 (BP Location: Left Arm)   Pulse (!) 113   Temp 98.1 F (36.7 C) (Oral)   Resp (!) 22   SpO2 94%   Visual Acuity Right Eye Distance:   Left Eye Distance:   Bilateral Distance:    Right Eye Near:   Left Eye Near:    Bilateral Near:     Physical Exam Vitals reviewed.   Constitutional:      General: He is awake.     Appearance: Normal appearance. He is well-developed. He is ill-appearing.  HENT:     Head: Normocephalic and atraumatic.  Cardiovascular:     Rate and Rhythm: Regular rhythm. Tachycardia present.     Heart sounds: Normal heart sounds, S1 normal and S2 normal. No murmur heard. Pulmonary:     Effort: Tachypnea and respiratory distress present.     Breath sounds: No stridor. Decreased breath sounds present. No wheezing,  rhonchi or rales.  Neurological:     Mental Status: He is alert.  Psychiatric:        Behavior: Behavior is cooperative.      UC Treatments / Results  Labs (all labs ordered are listed, but only abnormal results are displayed) Labs Reviewed - No data to display  EKG   Radiology No results found.  Procedures Procedures (including critical care time)  Medications Ordered in UC Medications  ipratropium-albuterol  (DUONEB) 0.5-2.5 (3) MG/3ML nebulizer solution 3 mL (3 mLs Nebulization Given 10/31/23 1546)    Initial Impression / Assessment and Plan / UC Course  I have reviewed the triage vital signs and the nursing notes.  Pertinent labs & imaging results that were available during my care of the patient were reviewed by me and considered in my medical decision making (see chart for details).     Patient is tachypneic, tachycardic, chronically ill-appearing with oxygen saturation initially measured at 83%.  He was titrated up to 10 L on a nonrebreather as organ saturation improved to 92%.  We did attempt to give him albuterol  in clinic and initially he was able to maintain oxygen saturation of 92% for a few minutes but then this worsened and he dropped to 88% after sitting for a few minutes.  He does not have supplemental oxygen at home.  EKG was obtained that showed sinus tachycardia with ventricular rate of 106 bpm.  He has widespread T wave inversion but this is unchanged from his previous EKG.  We discussed that  given his new oxygen requirement and ongoing shortness of breath he would need to go to the emergency room.  He was transported by [employer].  He was stable to time of discharge.  Final Clinical Impressions(s) / UC Diagnoses   Final diagnoses:  SOB (shortness of breath)  Acute respiratory distress   Discharge Instructions   None    ED Prescriptions   None    PDMP not reviewed this encounter.   Budd Cargo, PA-C 10/31/23 1606

## 2023-11-01 ENCOUNTER — Other Ambulatory Visit (HOSPITAL_COMMUNITY): Payer: Self-pay

## 2023-11-01 DIAGNOSIS — I2699 Other pulmonary embolism without acute cor pulmonale: Secondary | ICD-10-CM | POA: Diagnosis not present

## 2023-11-01 LAB — COMPREHENSIVE METABOLIC PANEL WITH GFR
ALT: 25 U/L (ref 0–44)
AST: 32 U/L (ref 15–41)
Albumin: 3.7 g/dL (ref 3.5–5.0)
Alkaline Phosphatase: 80 U/L (ref 38–126)
Anion gap: 12 (ref 5–15)
BUN: 11 mg/dL (ref 6–20)
CO2: 18 mmol/L — ABNORMAL LOW (ref 22–32)
Calcium: 9 mg/dL (ref 8.9–10.3)
Chloride: 109 mmol/L (ref 98–111)
Creatinine, Ser: 1.08 mg/dL (ref 0.61–1.24)
GFR, Estimated: >60 mL/min (ref >60)
Glucose, Bld: 108 mg/dL — ABNORMAL HIGH (ref 70–99)
Potassium: 3.7 mmol/L (ref 3.5–5.1)
Sodium: 139 mmol/L (ref 135–145)
Total Bilirubin: 0.9 mg/dL (ref 0.0–1.2)
Total Protein: 7.8 g/dL (ref 6.5–8.1)

## 2023-11-01 LAB — CBC WITH DIFFERENTIAL/PLATELET
Abs Immature Granulocytes: 0.01 10*3/uL (ref 0.00–0.07)
Basophils Absolute: 0.1 10*3/uL (ref 0.0–0.1)
Basophils Relative: 1 %
Eosinophils Absolute: 0.2 10*3/uL (ref 0.0–0.5)
Eosinophils Relative: 4 %
HCT: 47.7 % (ref 39.0–52.0)
Hemoglobin: 15.8 g/dL (ref 13.0–17.0)
Immature Granulocytes: 0 %
Lymphocytes Relative: 35 %
Lymphs Abs: 1.8 10*3/uL (ref 0.7–4.0)
MCH: 29.4 pg (ref 26.0–34.0)
MCHC: 33.1 g/dL (ref 30.0–36.0)
MCV: 88.8 fL (ref 80.0–100.0)
Monocytes Absolute: 0.6 10*3/uL (ref 0.1–1.0)
Monocytes Relative: 12 %
Neutro Abs: 2.6 10*3/uL (ref 1.7–7.7)
Neutrophils Relative %: 48 %
Platelets: 205 10*3/uL (ref 150–400)
RBC: 5.37 MIL/uL (ref 4.22–5.81)
RDW: 14.5 % (ref 11.5–15.5)
WBC: 5.3 10*3/uL (ref 4.0–10.5)
nRBC: 0 % (ref 0.0–0.2)

## 2023-11-01 LAB — TROPONIN I (HIGH SENSITIVITY): Troponin I (High Sensitivity): 25 ng/L — ABNORMAL HIGH (ref <18)

## 2023-11-01 LAB — MAGNESIUM
Magnesium: 2.1 mg/dL (ref 1.7–2.4)
Magnesium: 2.2 mg/dL (ref 1.7–2.4)

## 2023-11-01 LAB — PROCALCITONIN: Procalcitonin: <0.1 ng/mL

## 2023-11-01 LAB — BRAIN NATRIURETIC PEPTIDE: B Natriuretic Peptide: 578.4 pg/mL — ABNORMAL HIGH (ref 0.0–100.0)

## 2023-11-01 LAB — HEPARIN LEVEL (UNFRACTIONATED): Heparin Unfractionated: 0.36 [IU]/mL (ref 0.30–0.70)

## 2023-11-01 LAB — PHOSPHORUS: Phosphorus: 4 mg/dL (ref 2.5–4.6)

## 2023-11-01 MED ORDER — HYDRALAZINE HCL 20 MG/ML IJ SOLN
10.0000 mg | INTRAMUSCULAR | Status: DC | PRN
Start: 2023-11-01 — End: 2023-11-01

## 2023-11-01 MED ORDER — FUROSEMIDE 20 MG PO TABS
20.0000 mg | ORAL_TABLET | Freq: Every day | ORAL | 11 refills | Status: DC
  Filled 2023-11-01: qty 30, 30d supply, fill #0

## 2023-11-01 NOTE — Progress Notes (Signed)
 PHARMACY - ANTICOAGULATION CONSULT NOTE  Pharmacy Consult for IV heparin  Indication: chest pain/ACS  Allergies  Allergen Reactions   Other     An HIV drug and can not remember  exactly.    Patient Measurements: Height: 6\' 1"  (185.4 cm) Weight: 96 kg (211 lb 10.3 oz) IBW/kg (Calculated) : 79.9 HEPARIN  DW (KG): 90.8  Vital Signs: Temp: 97.9 F (36.6 C) (04/19 0425) Temp Source: Oral (04/19 0425) BP: 159/105 (04/19 0425) Pulse Rate: 105 (04/19 0425)  Labs: Recent Labs    10/31/23 1640 10/31/23 1859 11/01/23 0451 11/01/23 0452  HGB 16.2  --   --  15.8  HCT 49.6  --   --  47.7  PLT 210  --   --  205  HEPARINUNFRC  --   --  0.36  --   CREATININE 1.21  --   --  1.08  TROPONINIHS 19* 22*  --  25*    Estimated Creatinine Clearance: 126.5 mL/min (by C-G formula based on SCr of 1.08 mg/dL).   Medical History: Past Medical History:  Diagnosis Date   Cardiac anomaly, congenital    Diarrhea 01/22/2017   Dizziness 05/30/2020   EBV infection    hx/notes 10/23/2016   Erectile dysfunction 05/30/2020   Gonorrhea 07/20/2019   Hemoptysis 10/23/2016   Maximo Spar 10/23/2016   HIV (human immunodeficiency virus infection) (HCC)    Hypertension    Hypertension 04/30/2021   Migraine    "a few/year now; frequency is less than in the past" (10/23/2016)   Nonadherence to medical treatment 09/08/2019   Pharyngitis 10/23/2016   notes 10/23/2016   Pulmonary embolism (HCC) 01/29/2016   S/P bidirectional Glenn shunt    hx/notes 10/23/2016   S/P Fontan procedure    hx/notes 10/23/2016   Syphilis 07/20/2019   Tricuspid atresia and stenosis, congenital    Assessment: Micheal Leonard is a 28 y.o. year old male admitted on 10/31/2023 with concern for PE. Remote history of PE in 2017, no anticoagulation prior to admission. Pharmacy consulted to dose heparin .  Heparin  level 0.36, therapeutic  No issues with infusion or overt s/sx bleeding noted.   Goal of Therapy  Heparin  level 0.3-0.7  units/ml Monitor platelets by anticoagulation protocol: Yes   Plan:  Increase heparin  infusion to 1300 units/hr 6 heparin  level  Daily heparin  level, CBC, and monitoring for bleeding F/u plans for anticoagulation   Thank you for allowing pharmacy to participate in this patient's care.  Fonda Hymen, PharmD Emergency Medicine Clinical Pharmacist 11/01/2023,6:27 AM

## 2023-11-01 NOTE — Plan of Care (Signed)
  Problem: Clinical Measurements: Goal: Ability to maintain clinical measurements within normal limits will improve Outcome: Progressing Goal: Diagnostic test results will improve Outcome: Progressing Goal: Respiratory complications will improve Outcome: Progressing Goal: Cardiovascular complication will be avoided Outcome: Progressing   Problem: Pain Managment: Goal: General experience of comfort will improve and/or be controlled Outcome: Progressing

## 2023-11-01 NOTE — Discharge Summary (Signed)
 Physician Discharge Summary   Patient: Micheal Leonard MRN: [id number] DOB: 1995/09/15  Admit date:     10/31/2023  Discharge date: 11/01/23  Discharge Physician: Ephriam Hashimoto   PCP: Pcp, No     Recommendations at discharge:  Follow up with [facility] Dr. Ellard Gunning for HFrEF  Dr. Ellard Gunning: Please obtain BMP in 1 week on new Lasix  Please see CTA report below, revisit risk/benefit of life-long anticoagulation     Discharge Diagnoses: Principal Problem:   Acute on chronic systolic congestive heart failure Active Problems:   HIV (human immunodeficiency virus infection) (HCC)   Acute hypoxic respiratory failure ruled out   Myocardial injury due to CHF   Essential hypertension   Pulmonary embolism doubted   Congenital heart disease     Hospital Course: 28 y.o. 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  In the ER, CTA chest showed small bilateral pleural effusions, atelectasis.  There was a question of subsegmental LLL emboli, in the context of abnormal pulmonary vascular contrast penetration with his CHD.  Started on heparin  and IV Lasix  and admitted.  This was not a preventable readmission.  At the time of his last discharge, patient was given strict follow up precautions, but did not make follow up appointment.      Acute on chronic systolic congestive heart failure Congenital heart disease Patient reported several days progressive shortness of breath with exertion, progressive orthopnea, which were fairly severe so he came to the hospital.  In the ER, chest CT consistent with basilar infiltrates and small effusions.   Treated with IV Lasix  and symptoms improved.  No edema on exam and JVP normal.  Ambulated with improvement in symptoms.  Discharged on oral Lasix , continued spironolactone .  Strongly recommended follow up with  primary Cardiologist.       Doubted acute pulmonary embolism The patient's symptoms of slowly progressive DOE and orthopnea without hemoptysis or pleuritic pain are not particularly suggestive of PE.    Given CHD and unique anatomy, positive predictive value of subsegmental filling defect on CTA is very unclear.  Recent US  LE in similar circumstance last week showed no DVT.  Given this low level of confidence in a diagnosis of subsegmental PE, the benefit of lifelong anticoagulation for recurrent VTE is very unclear and I favor not doing so.    Defer to his primary cardiologist if any features of his individual anatomy make PE and lifelong AC warranted.  There is the added concern that his cognitive level and unreliable adherence to medications make lifelong anticoagluation more risky.   Sinus tachcyardia HR here was 100-115, similar to his last two outpatient office visits.    HIV This seems well controlled with injectables.            The [place]  Controlled Substances Registry was reviewed for this patient prior to discharge.  Consultants: None Procedures performed: CTA chest   Disposition: Home Diet recommendation:  Discharge Diet Orders (From admission, onward)     Start     Ordered   11/01/23 0000  Diet - low sodium heart healthy        11/01/23 1136             DISCHARGE MEDICATION: Allergies as of 11/01/2023       Reactions   Other    An HIV drug and can not  remember  exactly.        Medication List     STOP taking these medications    Excedrin  Extra Strength 250-250-65 MG tablet Generic drug: aspirin -acetaminophen -caffeine        TAKE these medications    aspirin  EC 81 MG tablet Take 1 tablet (81 mg total) by mouth daily.   bismuth  subsalicylate 262 MG chewable tablet Commonly known as: PEPTO BISMOL Chew 1 tablet (262 mg total) by mouth as needed for indigestion (nausea).   Cabenuva  600 & 900 MG/3ML injection Generic drug:  cabotegravir  & rilpivirine  ER Inject 1 kit into the muscle every 2 (two) months.   empagliflozin  10 MG Tabs tablet Commonly known as: Jardiance  Take 1 tablet (10 mg total) by mouth daily.   Entresto  24-26 MG Generic drug: sacubitril -valsartan  Take 1 tablet by mouth 2 (two) times daily.   fexofenadine  180 MG tablet Commonly known as: ALLEGRA  Take 1 tablet (180 mg total) by mouth daily.   furosemide  20 MG tablet Commonly known as: Lasix  Take 1 tablet (20 mg total) by mouth daily.   spironolactone  25 MG tablet Commonly known as: ALDACTONE  Take 1 tablet (25 mg total) by mouth daily.        Follow-up Information     Debroah Fanning, MD. Schedule an appointment as soon as possible for a visit in 1 week(s).   Specialty: Pediatric Cardiology Why: If the above number doesn't work, and you don't have an alternative, try: [phone] Contact information: [address] [place] [address] [phone]                 Discharge Instructions     Diet - low sodium heart healthy   Complete by: As directed    Discharge instructions   Complete by: As directed    **IMPORTANT DISCHARGE INSTRUCTIONS**   From Dr. Darlyn Eke: You were admitted for shortness of breath with exertion.  Here, we treated you with Lasix  and your symptoms improved  Continue your heart medicines from Dr. Richad Champagne: Aspirin  81 mg daily to prevent clots in your fontan Jardiance  10 mg daily to help heart squeeze Spironolactone  25 mg daily to help heart squeeze Entresto  24-26 mg TWICE daily to help heart squeeze  Now, we want you to ADD furosemide /Lasix  20 mg daily to keep extra fluid off  You MUST go see your doctor in 1 week for labs check And you MUST call Dr. Donne Gage office to schedule an appointment.  Call them asap on Monday  We considered again whether you have a clot in your lungs, but do not believe this is the case.  We believe the risk of a blood thinner is greater  than the potential benefit   Increase activity slowly   Complete by: As directed        Discharge Exam: Filed Weights   10/31/23 2200 11/01/23 0230  Weight: 90.8 kg 96 kg    General: Pt is alert, awake, not in acute distress Cardiovascular: RRR, nl S1-S2, no murmurs appreciated.   No LE edema.   Respiratory: Normal respiratory rate and rhythm.  CTAB without rales or wheezes. Abdominal: Abdomen soft and non-tender.  No distension or HSM.   Neuro/Psych: Strength symmetric in upper and lower extremities.  Judgment and insight appear normal.   Condition at discharge: good  The results of significant diagnostics from this hospitalization (including imaging, microbiology, ancillary and laboratory) are listed below for reference.   Imaging Studies: CT Angio Chest PE W and/or  Wo Contrast Result Date: 10/31/2023 CLINICAL DATA:  Chest pain EXAM: CT ANGIOGRAPHY CHEST WITH CONTRAST TECHNIQUE: Multidetector CT imaging of the chest was performed using the standard protocol during bolus administration of intravenous contrast. Multiplanar CT image reconstructions and MIPs were obtained to evaluate the vascular anatomy. RADIATION DOSE REDUCTION: This exam was performed according to the departmental dose-optimization program which includes automated exposure control, adjustment of the mA and/or kV according to patient size and/or use of iterative reconstruction technique. CONTRAST:  75mL OMNIPAQUE  IOHEXOL  350 MG/ML SOLN COMPARISON:  Chest x-ray 10/31/2023, chest CT 10/15/2023, 01/29/2016 FINDINGS: Cardiovascular: Postsurgical changes consistent with prior Allison Ivory and Fontan procedures with direct communication of SVC and IVC to the pulmonary arterial system on the right side. Only partial opacification of the Fontan conduit. Appearance similar to the prior exams. Very heterogeneous opacification of the pulmonary arterial system, mainly the main and lobar and proximal segmental pulmonary vessels. More  homogeneous opacification of the peripheral pulmonary vessels. Suspected filling defect within left lower lobe subsegmental vessels, series 8 image 173 through 197. Enlarged left ventricle and hypoplastic appearing right ventricle. No pericardial effusion. Mediastinum/Nodes: Patent trachea. No thyroid mass. No suspicious lymph nodes. Esophagus is unremarkable Lungs/Pleura: Small left and small moderate right pleural effusions which are increased compared to prior. No pneumothorax. Passive atelectasis in the lower lobes. Upper Abdomen: No acute finding Musculoskeletal: No acute osseous abnormality. Review of the MIP images confirms the above findings. IMPRESSION: 1. Postsurgical changes consistent with prior Allison Ivory and Fontan procedures. Very heterogeneous opacification of the pulmonary arterial system, mainly the main, lobar and proximal segmental pulmonary vessels which highly limits assessment for acute pulmonary embolus. More homogeneous opacification of the peripheral pulmonary vessels. Suspected filling defect/pulmonary emboli within left lower lobe subsegmental vessels. 2. Small left and small to moderate right pleural effusions which are increased compared to prior. Passive atelectasis in the lower lobes. Critical Value/emergent results were called by telephone at the time of interpretation on 10/31/2023 at 9:58 pm to provider [facility] , who verbally acknowledged these results. Electronically Signed   By: Esmeralda Hedge M.D.   On: 10/31/2023 21:58   DG Chest 2 View Result Date: 10/31/2023 CLINICAL DATA:  Chest pain EXAM: CHEST - 2 VIEW COMPARISON:  X-ray 10/16/2023 and older.  CTA 10/15/2023. FINDINGS: Stable cardiopericardial silhouette. Prominent central vasculature. No consolidation, pneumothorax or effusion. No edema. Films are under penetrated. Overlapping cardiac leads. Metallic implanted device is along the left hemithorax. Patient a history of a previous Fontan procedure. IMPRESSION: No  significant interval change. Electronically Signed   By: Adrianna Horde M.D.   On: 10/31/2023 18:14   VAS US  LOWER EXTREMITY VENOUS (DVT) Result Date: 10/16/2023  Lower Venous DVT Study Patient Name:  BENTLEY FISSEL  Date of Exam:   10/16/2023 Medical Rec #: [id number]         Accession #:    [id number] Date of Birth: 1996/07/12         Patient Gender: M Patient Age:   59 years Exam Location:  [facility] Procedure:      VAS US  LOWER EXTREMITY VENOUS (DVT) Referring Phys: JUSTIN HOWERTER --------------------------------------------------------------------------------  Indications: Pain.  Risk Factors: Confirmed PE. Comparison Study: None. Performing Technologist: Estanislao Heimlich  Examination Guidelines: A complete evaluation includes B-mode imaging, spectral Doppler, color Doppler, and power Doppler as needed of all accessible portions of each vessel. Bilateral testing is considered an integral part of a complete examination. Limited examinations for reoccurring indications may be performed as noted.  The reflux portion of the exam is performed with the patient in reverse Trendelenburg.  +---------+---------------+---------+-----------+----------+--------------+ RIGHT    CompressibilityPhasicitySpontaneityPropertiesThrombus Aging +---------+---------------+---------+-----------+----------+--------------+ CFV      Full           Yes      Yes                                 +---------+---------------+---------+-----------+----------+--------------+ SFJ      Full                                                        +---------+---------------+---------+-----------+----------+--------------+ FV Prox  Full                                                        +---------+---------------+---------+-----------+----------+--------------+ FV Mid   Full                                                        +---------+---------------+---------+-----------+----------+--------------+ FV  DistalFull                                                        +---------+---------------+---------+-----------+----------+--------------+ PFV      Full                                                        +---------+---------------+---------+-----------+----------+--------------+ POP      Full           Yes      Yes                                 +---------+---------------+---------+-----------+----------+--------------+ PTV      Full                    Yes                                 +---------+---------------+---------+-----------+----------+--------------+ PERO     Full                    Yes                                 +---------+---------------+---------+-----------+----------+--------------+   +---------+---------------+---------+-----------+----------+--------------+ LEFT     CompressibilityPhasicitySpontaneityPropertiesThrombus Aging +---------+---------------+---------+-----------+----------+--------------+ CFV      Full           Yes      Yes                                 +---------+---------------+---------+-----------+----------+--------------+  SFJ      Full                                                        +---------+---------------+---------+-----------+----------+--------------+ FV Prox  Full                                                        +---------+---------------+---------+-----------+----------+--------------+ FV Mid   Full                                                        +---------+---------------+---------+-----------+----------+--------------+ FV DistalFull                                                        +---------+---------------+---------+-----------+----------+--------------+ PFV      Full                                                        +---------+---------------+---------+-----------+----------+--------------+ POP      Full           Yes      Yes                                  +---------+---------------+---------+-----------+----------+--------------+ PTV      Full                    Yes                                 +---------+---------------+---------+-----------+----------+--------------+ PERO     Full                    Yes                                 +---------+---------------+---------+-----------+----------+--------------+     Summary: BILATERAL: - No evidence of deep vein thrombosis seen in the lower extremities, bilaterally. -No evidence of popliteal cyst, bilaterally.   *See table(s) above for measurements and observations. Electronically signed by Runell Countryman on 10/16/2023 at 4:15:05 PM.    Final    ECHOCARDIOGRAM COMPLETE Result Date: 10/16/2023    ECHOCARDIOGRAM REPORT   Patient Name:   Micheal Leonard Date of Exam: 10/16/2023 Medical Rec #:  [id number]        Height:       73.0 in Accession #:    [id number]       Weight:       215.0 lb Date of Birth:  09/19/1995        BSA:          2.219 m Patient Age:    27 years         BP:           169/114 mmHg Patient Gender: M                HR:           113 bpm. Exam Location:  Inpatient Procedure: 2D Echo, Color Doppler and Cardiac Doppler (Both Spectral and Color            Flow Doppler were utilized during procedure). Indications:    Elevated Troponins  History:        Patient has prior history of Echocardiogram examinations, most                 recent 04/03/2023. CHF, Signs/Symptoms:HIV; Risk                 Factors:Hypertension. 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  Sonographer:    Sherline Distel Senior RDCS Referring Phys: [id number] Roxana Copier  Sonographer Comments: Definity not used due to known right to left shunting IMPRESSIONS  1. Single ventricle morphology- the LV is the systemic ventricle. Left ventricular ejection fraction, by estimation, is 25 to 30%. Left  ventricular ejection fraction by PLAX is 26 %. The left ventricle has severely decreased function. The left ventricle  demonstrates global hypokinesis. The left ventricular internal cavity size was severely dilated. Left ventricular diastolic parameters are indeterminate.  2. Flow across the VSD and ASD are both unrestricted. Evidence of atrial level shunting detected by color flow Doppler. 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  3. 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  4. The right atrium is very small consistent with Fontan physiology.  5. The mitral valve is abnormal. Severe mitral valve regurgitation.  6. 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  7. The aortic valve is tricuspid. Aortic valve regurgitation is trivial.  8. IVC is directed into an extracardiac Fontan which has low velocity respirophasic flow indicative of normal Fontan physiology.     The SVC is directed into a bidirectional Allison Ivory shunt which has low velocity respirophasic flow indicative of normal Glenn physiology. The inferior vena cava is normal in size with <50% respiratory variability, suggesting right atrial pressure of 8 mmHg. Comparison(s): Prior images unable to be directly viewed, comparison made by report only. Changes from prior study are noted. 04/03/2023 ([facility]): LVEF 25%, mild to moderate MR. FINDINGS  Left Ventricle: Single ventricle morphology- the LV is the systemic ventricle. Left ventricular ejection fraction, by estimation, is 25 to 30%. Left ventricular ejection fraction by PLAX is 26 %. The left ventricle has severely decreased function. The left ventricle demonstrates global hypokinesis. The left ventricular internal cavity size was severely dilated. There is no left ventricular hypertrophy. Left ventricular diastolic parameters are indeterminate. Right Ventricle: The right ventricle is  congenitally hypoplastic. Left Atrium: Left atrial size was normal in size. Right Atrium: The right atrium is very small consistent with Fontan physiology. Pericardium: There is no evidence of pericardial effusion. Mitral  Valve: The mitral valve is abnormal. Severe mitral valve regurgitation. 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 Aortic Valve: The aortic valve is tricuspid. Aortic valve regurgitation is trivial. Pulmonic Valve: The pulmonic valve was grossly normal. Pulmonic valve regurgitation is not visualized. Aorta: The aortic root and ascending aorta are structurally normal, with no evidence of dilitation. Venous: IVC is directed into an extracardiac Fontan which has low velocity respirophasic flow indicative of normal Fontan physiology. The SVC is directed into a bidirectional Allison Ivory shunt which has low velocity respirophasic flow indicative of normal Glenn physiology. The inferior vena cava is normal in size with less than 50% respiratory variability, suggesting right atrial pressure of  8 mmHg. IAS/Shunts: Evidence of atrial level shunting detected by color flow Doppler. There is a small membranous (infracristal) ventricular septal defect with bidirectional shunting. 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  LEFT VENTRICLE PLAX 2D LV EF:         Left            Diastology                ventricular     LV e' medial:   7.20 cm/s                ejection        LV E/e' medial: 7.9                fraction by                PLAX is 26                %. LVIDd:         7.30 cm LVIDs:         6.40 cm LV PW:         1.10 cm LV IVS:        1.00 cm LVOT diam:     2.60 cm LV SV:         33 LV SV Index:   15 LVOT Area:     5.31 cm  LEFT ATRIUM             Index LA diam:        3.00 cm 1.35 cm/m LA Vol (A2C):   68.0 ml 30.65 ml/m LA Vol (A4C):   39.0 ml 17.58 ml/m LA Biplane Vol: 53.4 ml 24.07 ml/m  AORTIC VALVE LVOT Vmax:   48.50 cm/s LVOT Vmean:  35.050  cm/s LVOT VTI:    0.063 m  AORTA Ao Root diam: 3.40 cm Ao Asc diam:  3.10 cm MITRAL VALVE MV Area (PHT): 4.33 cm       SHUNTS MV Decel Time: 175 msec       Systemic VTI:  0.06 m MR Peak grad:    92.9 mmHg    Systemic Diam: 2.60 cm MR Mean grad:    48.0 mmHg MR Vmax:         482.00 cm/s MR Vmean:        328.0 cm/s MR PISA:         4.02 cm MR PISA Eff ROA: 32 mm MR PISA Radius:  0.80 cm MV E velocity: 56.70 cm/s MV A velocity: 34.40 cm/s MV E/A ratio:  1.65 Dinah Franco MD Electronically signed by Dinah Franco MD Signature Date/Time: 10/16/2023/2:38:48 PM    Final    DG Chest Port 1 View Result Date: 10/16/2023 CLINICAL DATA:  Hypoxic seal, tachycardia EXAM: PORTABLE CHEST 1 VIEW COMPARISON:  10/15/2023 FINDINGS: Lungs are clear. No pneumothorax or pleural effusion. Stable cardiomegaly. Embolization plugs are seen within the left perihilar region, unchanged. Pulmonary vascularity is normal. No acute bone abnormality. IMPRESSION: 1. No active disease. 2. Cardiomegaly. Electronically Signed   By: Worthy Heads M.D.   On: 10/16/2023 04:12   CT Angio Chest PE W and/or Wo Contrast Result Date: 10/15/2023 CLINICAL DATA:  Pulmonary embolism (PE) suspected, high prob. intermittent right side body numbness onset approx. 6 am this morning with mild nausea, speech clear with no facial asymmetry , alert and oriented /ambulatory . History of tricuspid atresia and Fontan procedure. EXAM: CT ANGIOGRAPHY CHEST WITH CONTRAST TECHNIQUE: Multidetector CT imaging of the chest was performed using the standard protocol during bolus administration of intravenous contrast. Multiplanar CT image reconstructions and MIPs were obtained to evaluate the vascular anatomy. RADIATION DOSE REDUCTION: This exam was performed according to the departmental dose-optimization program which includes automated exposure control, adjustment of the mA and/or kV according to patient size and/or use of iterative reconstruction technique. CONTRAST:  75mL  OMNIPAQUE  IOHEXOL  350 MG/ML SOLN COMPARISON:  CT angio chest 10/23/2016 FINDINGS: Cardiovascular: Prior Fontan procedure surgical changes noted. Satisfactory opacification of the pulmonary arteries to the segmental level. Likely left lower lobe subsegmental pulmonary embolus (7:294). Enlarged right heart size consistent with history of tricuspid atresia. No pericardial effusion. Mediastinum/Nodes: No enlarged mediastinal, hilar, or axillary lymph nodes. Thyroid gland, trachea, and esophagus demonstrate no significant findings. Lungs/Pleura: No focal consolidation. Stable right upper lobe 5 x 4 mm pulmonary nodule. No pulmonary mass. No pleural effusion. No pneumothorax. Upper Abdomen: No acute abnormality. Musculoskeletal: No chest wall abnormality.  Bilateral gynecomastia. No suspicious lytic or blastic osseous lesions. No acute displaced fracture. Multilevel degenerative changes of the spine. Review of the MIP images confirms the above findings. IMPRESSION: 1. Likely left lower lobe subsegmental pulmonary embolus with no associated pulmonary infarction or right heart strain. 2. Fontan procedure surgical changes. Electronically Signed   By: Morgane  Naveau M.D.   On: 10/15/2023 22:39   DG Chest Port 1 View Result Date: 10/15/2023 CLINICAL DATA:  Cough and hypoxia EXAM: PORTABLE CHEST 1 VIEW COMPARISON:  10/22/2016 FINDINGS: Increased size of the cardiomediastinal silhouette compared to 10/22/2016. Cardiac and mediastinal closure devices. Pulmonary vascular congestion. No focal consolidation, pleural effusion, or pneumothorax. No displaced rib fractures. IMPRESSION: 1. Increased size of the cardiomediastinal silhouette compared to 10/22/2016. This may be due to hypertrophy or pericardial effusion. Recommend correlation with echocardiogram. 2. Pulmonary vascular congestion. Electronically Signed   By: Rozell Cornet M.D.   On: 10/15/2023 21:36    Microbiology: Results for orders placed or performed during the  hospital encounter of 10/31/23  Resp panel by RT-PCR (RSV, Flu A&B, Covid) Anterior Nasal Swab     Status: None   Collection Time: 10/31/23  5:22 PM   Specimen: Anterior Nasal Swab  Result Value Ref Range Status   SARS Coronavirus 2 by RT PCR NEGATIVE NEGATIVE Final   Influenza A by PCR NEGATIVE NEGATIVE Final   Influenza B by PCR NEGATIVE NEGATIVE Final    Comment: (NOTE) The Xpert Xpress SARS-CoV-2/FLU/RSV plus assay is intended as an aid in the diagnosis of influenza from Nasopharyngeal swab specimens and should not be used as a sole basis for treatment. Nasal washings and aspirates are unacceptable for Xpert Xpress SARS-CoV-2/FLU/RSV testing.  Fact Sheet for Patients: BloggerCourse.com  Fact Sheet for Healthcare Providers: SeriousBroker.it  This test  is not yet approved or cleared by the [place]  FDA and has been authorized for detection and/or diagnosis of SARS-CoV-2 by FDA under an Emergency Use Authorization (EUA). This EUA will remain in effect (meaning this test can be used) for the duration of the COVID-19 declaration under Section 564(b)(1) of the Act, 21 U.S.C. section 360bbb-3(b)(1), unless the authorization is terminated or revoked.     Resp Syncytial Virus by PCR NEGATIVE NEGATIVE Final    Comment: (NOTE) Fact Sheet for Patients: BloggerCourse.com  Fact Sheet for Healthcare Providers: SeriousBroker.it  This test is not yet approved or cleared by the [place]  FDA and has been authorized for detection and/or diagnosis of SARS-CoV-2 by FDA under an Emergency Use Authorization (EUA). This EUA will remain in effect (meaning this test can be used) for the duration of the COVID-19 declaration under Section 564(b)(1) of the Act, 21 U.S.C. section 360bbb-3(b)(1), unless the authorization is terminated or revoked.  Performed at [facility], [address] [place], [place] [address]     Labs: CBC: Recent Labs  Lab 10/31/23 1640 11/01/23 0452  WBC 5.4 5.3  NEUTROABS  --  2.6  HGB 16.2 15.8  HCT 49.6 47.7  MCV 90.5 88.8  PLT 210 205   Basic Metabolic Panel: Recent Labs  Lab 10/31/23 1640 11/01/23 0451 11/01/23 0452  NA 138  --  139  K 3.7  --  3.7  CL 108  --  109  CO2 18*  --  18*  GLUCOSE 90  --  108*  BUN 10  --  11  CREATININE 1.21  --  1.08  CALCIUM 9.0  --  9.0  MG  --  2.2 2.1  PHOS  --   --  4.0   Liver Function Tests: Recent Labs  Lab 11/01/23 0452  AST 32  ALT 25  ALKPHOS 80  BILITOT 0.9  PROT 7.8  ALBUMIN 3.7   CBG: No results for input(s): "GLUCAP" in the last 168 hours.  Discharge time spent: approximately 45 minutes spent on discharge counseling, evaluation of patient on day of discharge, and coordination of discharge planning with nursing, social work, pharmacy and case management  Signed: Ephriam Hashimoto, MD [facility] 11/01/2023

## 2023-11-01 NOTE — Plan of Care (Signed)

## 2023-11-01 NOTE — Progress Notes (Signed)
 Patient ambulated unassisted in hallway x approximately 563ft with continuous SPO2 monitor.  Maintained HR 100 - 120, SPO2 89 - 95% with no reported symptoms or increased work of breathing.  Dr. Darlyn Eke made aware by secure message.  Will continue to monitor.

## 2023-11-01 NOTE — Hospital Course (Signed)
 28 y.o. 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  In the ER, CTA chest showed small bilateral pleural effusions, atelectasis.  There was a question of subsegmental LLL emboli, in the context of abnormal pulmonary vascular contrast penetration with his CHD.  Started on heparin  and IV Lasix  and admitted.

## 2023-11-17 ENCOUNTER — Other Ambulatory Visit: Payer: Self-pay

## 2023-11-17 ENCOUNTER — Other Ambulatory Visit (HOSPITAL_COMMUNITY): Payer: Self-pay

## 2023-11-17 ENCOUNTER — Other Ambulatory Visit: Payer: Self-pay | Admitting: Pharmacist

## 2023-11-17 DIAGNOSIS — B20 Human immunodeficiency virus [HIV] disease: Secondary | ICD-10-CM

## 2023-11-17 MED ORDER — CABOTEGRAVIR & RILPIVIRINE ER 600 & 900 MG/3ML IM SUER
1.0000 | INTRAMUSCULAR | 5 refills | Status: AC
  Filled 2023-11-17: qty 6, 56d supply, fill #0
  Filled 2024-01-26: qty 6, 56d supply, fill #1
  Filled 2024-03-31: qty 6, 56d supply, fill #2
  Filled 2024-05-26: qty 6, 56d supply, fill #3
  Filled 2024-07-29 (×2): qty 6, 56d supply, fill #4

## 2023-11-17 NOTE — Progress Notes (Signed)
 Specialty Pharmacy Refill Coordination Note  Micheal Leonard is a 28 y.o. male assessed today regarding refills of clinic administered specialty medication(s) Cabotegravir  & Rilpivirine  (CABENUVA )   Clinic requested Courier to Provider Office   Delivery date: 11/28/23   Verified address: [address] [place] [address]   Medication will be filled on 11/27/23.

## 2023-11-21 DIAGNOSIS — R918 Other nonspecific abnormal finding of lung field: Secondary | ICD-10-CM | POA: Diagnosis not present

## 2023-11-21 DIAGNOSIS — Z7982 Long term (current) use of aspirin: Secondary | ICD-10-CM | POA: Diagnosis not present

## 2023-11-21 DIAGNOSIS — R079 Chest pain, unspecified: Secondary | ICD-10-CM | POA: Diagnosis not present

## 2023-11-21 DIAGNOSIS — Z7984 Long term (current) use of oral hypoglycemic drugs: Secondary | ICD-10-CM | POA: Diagnosis not present

## 2023-11-21 DIAGNOSIS — I110 Hypertensive heart disease with heart failure: Secondary | ICD-10-CM | POA: Diagnosis not present

## 2023-11-21 DIAGNOSIS — Z79899 Other long term (current) drug therapy: Secondary | ICD-10-CM | POA: Diagnosis not present

## 2023-11-21 DIAGNOSIS — J90 Pleural effusion, not elsewhere classified: Secondary | ICD-10-CM | POA: Diagnosis not present

## 2023-11-21 DIAGNOSIS — Z21 Asymptomatic human immunodeficiency virus [HIV] infection status: Secondary | ICD-10-CM | POA: Diagnosis not present

## 2023-11-21 DIAGNOSIS — I5023 Acute on chronic systolic (congestive) heart failure: Secondary | ICD-10-CM | POA: Diagnosis not present

## 2023-11-21 DIAGNOSIS — J811 Chronic pulmonary edema: Secondary | ICD-10-CM | POA: Diagnosis not present

## 2023-11-21 DIAGNOSIS — R931 Abnormal findings on diagnostic imaging of heart and coronary circulation: Secondary | ICD-10-CM | POA: Diagnosis not present

## 2023-11-22 DIAGNOSIS — B20 Human immunodeficiency virus [HIV] disease: Secondary | ICD-10-CM | POA: Diagnosis not present

## 2023-11-22 DIAGNOSIS — J90 Pleural effusion, not elsewhere classified: Secondary | ICD-10-CM | POA: Diagnosis not present

## 2023-11-22 DIAGNOSIS — R0789 Other chest pain: Secondary | ICD-10-CM | POA: Diagnosis not present

## 2023-11-22 DIAGNOSIS — Q224 Congenital tricuspid stenosis: Secondary | ICD-10-CM | POA: Diagnosis not present

## 2023-11-22 DIAGNOSIS — J811 Chronic pulmonary edema: Secondary | ICD-10-CM | POA: Diagnosis not present

## 2023-11-22 DIAGNOSIS — I5023 Acute on chronic systolic (congestive) heart failure: Secondary | ICD-10-CM | POA: Diagnosis not present

## 2023-11-22 DIAGNOSIS — Z9889 Other specified postprocedural states: Secondary | ICD-10-CM | POA: Diagnosis not present

## 2023-11-22 DIAGNOSIS — Z8774 Personal history of (corrected) congenital malformations of heart and circulatory system: Secondary | ICD-10-CM | POA: Diagnosis not present

## 2023-11-22 DIAGNOSIS — K9189 Other postprocedural complications and disorders of digestive system: Secondary | ICD-10-CM | POA: Diagnosis not present

## 2023-11-22 DIAGNOSIS — I519 Heart disease, unspecified: Secondary | ICD-10-CM | POA: Diagnosis not present

## 2023-11-22 DIAGNOSIS — K769 Liver disease, unspecified: Secondary | ICD-10-CM | POA: Diagnosis not present

## 2023-11-24 DIAGNOSIS — I088 Other rheumatic multiple valve diseases: Secondary | ICD-10-CM | POA: Diagnosis not present

## 2023-11-27 ENCOUNTER — Other Ambulatory Visit: Payer: Self-pay

## 2023-11-27 ENCOUNTER — Other Ambulatory Visit (HOSPITAL_COMMUNITY): Payer: Self-pay

## 2023-11-28 ENCOUNTER — Telehealth: Payer: Self-pay

## 2023-11-28 NOTE — Telephone Encounter (Signed)
 [facility] Patient Advocate Encounter  Patient's medications Cabenuva  have been couriered to [facility] from [facility] and will be administered at the patients appointment on 12/03/26.  Roylene Corn, CPhT Specialty Pharmacy Patient [facility] Phone: [phone] Fax:  [phone]

## 2023-12-01 DIAGNOSIS — L409 Psoriasis, unspecified: Secondary | ICD-10-CM | POA: Diagnosis not present

## 2023-12-01 DIAGNOSIS — Q224 Congenital tricuspid stenosis: Secondary | ICD-10-CM | POA: Diagnosis not present

## 2023-12-01 DIAGNOSIS — I5023 Acute on chronic systolic (congestive) heart failure: Secondary | ICD-10-CM | POA: Diagnosis not present

## 2023-12-01 DIAGNOSIS — Z8774 Personal history of (corrected) congenital malformations of heart and circulatory system: Secondary | ICD-10-CM | POA: Diagnosis not present

## 2023-12-02 NOTE — Progress Notes (Signed)
 Subjective:  Chief complaint: follow-up for HIV disease on medications   Patient ID: Micheal Leonard, male    DOB: Jan 02, 1996, 28 y.o.   MRN: [id number]  HPI   Discussed the use of AI scribe software for clinical note transcription with the patient, who gave verbal consent to proceed.  History of Present Illness   Micheal Leonard is a 27 year old male who presents for follow-up of his HIV treatment.  He has been on Cabenuva  for almost two years, effectively controlling his HIV infection. He adheres to his treatment regimen without significant side effects. He has a history of syphilis, resulting in scarring on his legs, and continues to use a prescribed cream for management. He was recently hospitalized for severe hypertension, in context of his congenital heart disease apparently caused him to be hospitalized for heart failure. He was ultimately seen at [facility] who are currently managing this issue. His case worker came with him to his appt and informed me ofhis hospitalization as well.       Past Medical History:  Diagnosis Date   Cardiac anomaly, congenital    Diarrhea 01/22/2017   Dizziness 05/30/2020   EBV infection    hx/notes 10/23/2016   Erectile dysfunction 05/30/2020   Gonorrhea 07/20/2019   Hemoptysis 10/23/2016   Maximo Spar 10/23/2016   HIV (human immunodeficiency virus infection) (HCC)    Hypertension    Hypertension 04/30/2021   Migraine    "a few/year now; frequency is less than in the past" (10/23/2016)   Nonadherence to medical treatment 09/08/2019   Pharyngitis 10/23/2016   notes 10/23/2016   Pulmonary embolism (HCC) 01/29/2016   S/P bidirectional Glenn shunt    hx/notes 10/23/2016   S/P Fontan procedure    hx/notes 10/23/2016   Syphilis 07/20/2019   Tricuspid atresia and stenosis, congenital     Past Surgical History:  Procedure Laterality Date   CARDIAC CATHETERIZATION  01/2016   at [facility]; hx/notes 10/23/2016   CARDIAC SURGERY  01/1997; 2000   fontane procedure at  [facility] as a child     Family History  Problem Relation Age of Onset   Hypertension Father       Social History   Socioeconomic History   Marital status: Significant Other    Spouse name: Not on file   Number of children: Not on file   Years of education: Not on file   Highest education level: Not on file  Occupational History   Not on file  Tobacco Use   Smoking status: Never   Smokeless tobacco: Never  Vaping Use   Vaping status: Never Used  Substance and Sexual Activity   Alcohol use: Not Currently    Comment: 2 GLASSES A WEEK   Drug use: Not Currently    Frequency: 2.0 times per week    Types: Marijuana   Sexual activity: Yes    Partners: Male    Birth control/protection: Condom    Comment: declined condoms  Other Topics Concern   Not on file  Social History Narrative   Not on file   Social Drivers of Health   Financial Resource Strain: Low Risk  (11/22/2023)   Received [facility]   Overall Financial Resource Strain (CARDIA)    Difficulty of Paying Living Expenses: Not hard at all  Food Insecurity: No Food Insecurity (11/22/2023)   Received from [facility]   Hunger Vital Sign    Worried About Running Out of Food in the Last  Year: Never true    Ran Out of Food in the Last Year: Never true  Transportation Needs: Unmet Transportation Needs (11/22/2023)   Received from [facility]    In the past 12 months, has lack of transportation kept you from medical appointments or from getting medications?: Yes    Lack of Transportation (Non-Medical): Not on file  Physical Activity: Not on file  Stress: Not on file  Social Connections: Unknown (10/17/2023)   Social Connection and Isolation Panel [NHANES]    Frequency of Communication with Friends and Family: Never    Frequency of Social Gatherings with Friends and Family: Never    Attends Religious Services: Never    Camera operator: Not on file    Attends Banker Meetings: Never    Marital Status: Not on file    Allergies  Allergen Reactions   Other     An HIV drug and can not remember  exactly.     Current Outpatient Medications:    aspirin  EC 81 MG tablet, Take 1 tablet (81 mg total) by mouth daily. (Patient not taking: Reported on 10/31/2023), Disp: 30 tablet, Rfl: 12   bismuth  subsalicylate (PEPTO BISMOL) 262 MG chewable tablet, Chew 1 tablet (262 mg total) by mouth as needed for indigestion (nausea)., Disp: 30 tablet, Rfl: 0   cabotegravir  & rilpivirine  ER (CABENUVA ) 600 & 900 MG/3ML injection, Inject 1 kit into the muscle every 2 (two) months., Disp: 6 mL, Rfl: 5   empagliflozin  (JARDIANCE ) 10 MG TABS tablet, Take 1 tablet (10 mg total) by mouth daily., Disp: 30 tablet, Rfl: 1   fexofenadine  (ALLEGRA ) 180 MG tablet, Take 1 tablet (180 mg total) by mouth daily., Disp: 30 tablet, Rfl: 0   furosemide  (LASIX ) 20 MG tablet, Take 1 tablet (20 mg total) by mouth daily., Disp: 30 tablet, Rfl: 11   sacubitril -valsartan  (ENTRESTO ) 24-26 MG, Take 1 tablet by mouth 2 (two) times daily., Disp: 60 tablet, Rfl: 0   spironolactone  (ALDACTONE ) 25 MG tablet, Take 1 tablet (25 mg total) by mouth daily., Disp: 30 tablet, Rfl: 0    Review of Systems  Constitutional:  Negative for activity change, appetite change, chills, diaphoresis, fatigue, fever and unexpected weight change.  HENT:  Negative for congestion, rhinorrhea, sinus pressure, sneezing, sore throat and trouble swallowing.   Eyes:  Negative for photophobia and visual disturbance.  Respiratory:  Negative for cough, chest tightness, shortness of breath, wheezing and stridor.   Cardiovascular:  Negative for chest pain, palpitations and leg swelling.  Gastrointestinal:  Negative for abdominal distention, abdominal pain, anal bleeding, blood in stool, constipation, diarrhea, nausea and vomiting.  Genitourinary:  Negative for difficulty  urinating, dysuria, flank pain and hematuria.  Musculoskeletal:  Negative for arthralgias, back pain, gait problem, joint swelling and myalgias.  Skin:  Negative for color change, pallor, rash and wound.  Neurological:  Negative for dizziness, tremors, weakness and light-headedness.  Hematological:  Negative for adenopathy. Does not bruise/bleed easily.  Psychiatric/Behavioral:  Negative for agitation, behavioral problems, confusion, decreased concentration, dysphoric mood and sleep disturbance.        Objective:   Physical Exam Constitutional:      Appearance: He is well-developed.  HENT:     Head: Normocephalic and atraumatic.  Eyes:     Conjunctiva/sclera: Conjunctivae normal.  Cardiovascular:     Rate and Rhythm: Normal rate and regular rhythm.  Pulmonary:     Effort: Pulmonary  effort is normal. No respiratory distress.     Breath sounds: No wheezing.  Abdominal:     General: There is no distension.     Palpations: Abdomen is soft.  Musculoskeletal:        General: No tenderness. Normal range of motion.     Cervical back: Normal range of motion and neck supple.  Skin:    General: Skin is warm and dry.     Coloration: Skin is not pale.     Findings: No erythema or rash.  Neurological:     General: No focal deficit present.     Mental Status: He is alert and oriented to person, place, and time.  Psychiatric:        Mood and Affect: Mood normal.        Behavior: Behavior normal.        Thought Content: Thought content normal.        Judgment: Judgment normal.     Rash on shins:           Assessment & Plan:   Assessment and Plan    HIV infection HIV managed with Cabenuva , effective despite initial viral control issues. On Cabenuva  for two years with good results. - Administer Cabenuva  injection. - Order labs for STIs and HIV viral load.   CHF in context of congenital heart disease:   cotninue Jardiance , entrestro, aldactone  and close follow-up at  [facility]   HTN" continue aldactone    STi screening: check RPR and GC and Chl in urine, rectum and OP

## 2023-12-03 ENCOUNTER — Other Ambulatory Visit: Payer: Self-pay

## 2023-12-03 ENCOUNTER — Encounter: Payer: Self-pay | Admitting: Infectious Disease

## 2023-12-03 ENCOUNTER — Ambulatory Visit (INDEPENDENT_AMBULATORY_CARE_PROVIDER_SITE_OTHER): Admitting: Infectious Disease

## 2023-12-03 VITALS — BP 145/94 | HR 104 | Temp 98.4°F | Ht 72.0 in | Wt 205.0 lb

## 2023-12-03 DIAGNOSIS — B20 Human immunodeficiency virus [HIV] disease: Secondary | ICD-10-CM | POA: Diagnosis not present

## 2023-12-03 DIAGNOSIS — Z8774 Personal history of (corrected) congenital malformations of heart and circulatory system: Secondary | ICD-10-CM | POA: Diagnosis not present

## 2023-12-03 DIAGNOSIS — I5023 Acute on chronic systolic (congestive) heart failure: Secondary | ICD-10-CM

## 2023-12-03 DIAGNOSIS — Z113 Encounter for screening for infections with a predominantly sexual mode of transmission: Secondary | ICD-10-CM | POA: Diagnosis not present

## 2023-12-03 DIAGNOSIS — I10 Essential (primary) hypertension: Secondary | ICD-10-CM | POA: Diagnosis not present

## 2023-12-03 DIAGNOSIS — I509 Heart failure, unspecified: Secondary | ICD-10-CM | POA: Diagnosis not present

## 2023-12-03 DIAGNOSIS — A539 Syphilis, unspecified: Secondary | ICD-10-CM

## 2023-12-03 MED ORDER — CABOTEGRAVIR & RILPIVIRINE ER 600 & 900 MG/3ML IM SUER
1.0000 | Freq: Once | INTRAMUSCULAR | Status: AC
  Administered 2023-12-03: 1 via INTRAMUSCULAR

## 2023-12-04 LAB — GC/CHLAMYDIA PROBE, AMP (THROAT)
Chlamydia trachomatis RNA: NOT DETECTED
Neisseria gonorrhoeae RNA: NOT DETECTED

## 2023-12-04 LAB — CT/NG RNA, TMA RECTAL
Chlamydia Trachomatis RNA: NOT DETECTED
Neisseria Gonorrhoeae RNA: NOT DETECTED

## 2023-12-04 LAB — C. TRACHOMATIS/N. GONORRHOEAE RNA
C. trachomatis RNA, TMA: NOT DETECTED
N. gonorrhoeae RNA, TMA: NOT DETECTED

## 2023-12-05 ENCOUNTER — Ambulatory Visit: Payer: Self-pay | Admitting: Pharmacist

## 2023-12-06 LAB — HIV RNA, RTPCR W/R GT (RTI, PI,INT)
HIV 1 RNA Quant: <20 {copies}/mL — AB
HIV-1 RNA Quant, Log: <1.3 {Log_copies}/mL — AB

## 2023-12-06 LAB — T-HELPER CELLS (CD4) COUNT (NOT AT ARMC)
Absolute CD4: 396 {cells}/uL — ABNORMAL LOW (ref 490–1740)
CD4 T Helper %: 25 % — ABNORMAL LOW (ref 30–61)
Total lymphocyte count: 1565 {cells}/uL (ref 850–3900)

## 2023-12-06 LAB — RPR: RPR Ser Ql: REACTIVE — AB

## 2023-12-06 LAB — RPR TITER: RPR Titer: 1:2 {titer} — ABNORMAL HIGH

## 2023-12-06 LAB — T PALLIDUM AB: T Pallidum Abs: POSITIVE — AB

## 2024-01-12 ENCOUNTER — Telehealth: Payer: Self-pay | Admitting: *Deleted

## 2024-01-12 DIAGNOSIS — I10 Essential (primary) hypertension: Secondary | ICD-10-CM

## 2024-01-12 DIAGNOSIS — I5023 Acute on chronic systolic (congestive) heart failure: Secondary | ICD-10-CM

## 2024-01-12 NOTE — Progress Notes (Signed)
 Complex Care Management Note  Care Guide Note 01/12/2024 Name: Micheal Leonard MRN: [id number] DOB: 1996-03-17  Micheal Leonard is a 28 y.o. year old male who sees Pcp, No for primary care. I reached out to Kobie Treinen by phone today to offer complex care management services.  [name] was given information about Complex Care Management services today including:   The Complex Care Management services include support from the care team which includes your Nurse Care Manager, Clinical Social Worker, or Pharmacist.  The Complex Care Management team is here to help remove barriers to the health concerns and goals most important to you. Complex Care Management services are voluntary, and the patient may decline or stop services at any time by request to their care team member.   Complex Care Management Consent Status: Patient agreed to services and verbal consent obtained.   Follow up plan:  Telephone appointment with complex care management team member scheduled for:  01/21/24  Encounter Outcome:  Patient Scheduled  Harlene Satterfield  [facility]  [facility], [facility] Guide  Direct Dial: [phone]  Fax [phone]

## 2024-01-12 NOTE — Progress Notes (Signed)
 Complex Care Management Note Care Guide Note  01/12/2024 Name: Micheal Leonard MRN: [id number] DOB: Dec 06, 1995   Complex Care Management Outreach Attempts: An unsuccessful telephone outreach was attempted today to offer the patient information about available complex care management services.  Follow Up Plan:  Additional outreach attempts will be made to offer the patient complex care management information and services.   Encounter Outcome:  No Answer  Harlene Satterfield  [facility]  [facility], [facility] Guide  Direct Dial: [phone]  Fax [phone]

## 2024-01-21 ENCOUNTER — Other Ambulatory Visit: Payer: Self-pay

## 2024-01-21 NOTE — Patient Outreach (Signed)
 Complex Care Management   Visit Note  01/21/2024  Name:  Micheal Leonard MRN: [id number] DOB: 1995/07/28  Situation: Referral received for Complex Care Management related to Lack of PCP. I obtained verbal consent from Patient.  Visit completed with Micheal Leonard  on the phone  Background:   Past Medical History:  Diagnosis Date   Cardiac anomaly, congenital    Diarrhea 01/22/2017   Dizziness 05/30/2020   EBV infection    hx/notes 10/23/2016   Erectile dysfunction 05/30/2020   Gonorrhea 07/20/2019   Hemoptysis 10/23/2016   thelbert 10/23/2016   HIV (human immunodeficiency virus infection) (HCC)    Hypertension    Hypertension 04/30/2021   Migraine    a few/year now; frequency is less than in the past (10/23/2016)   Nonadherence to medical treatment 09/08/2019   Pharyngitis 10/23/2016   notes 10/23/2016   Pulmonary embolism (HCC) 01/29/2016   S/P bidirectional Glenn shunt    hx/notes 10/23/2016   S/P Fontan procedure    hx/notes 10/23/2016   Syphilis 07/20/2019   Tricuspid atresia and stenosis, congenital     Assessment: Patient Reported Symptoms:  Cognitive Cognitive Status: Alert and oriented to person, place, and time, Insightful and able to interpret abstract concepts, Normal speech and language skills      Neurological Neurological Review of Symptoms: No symptoms reported    HEENT HEENT Symptoms Reported: No symptoms reported      Cardiovascular Cardiovascular Symptoms Reported: No symptoms reported    Respiratory Respiratory Symptoms Reported: No symptoms reported    Endocrine Endocrine Symptoms Reported: No symptoms reported    Gastrointestinal Gastrointestinal Symptoms Reported: No symptoms reported      Genitourinary Genitourinary Symptoms Reported: No symptoms reported    Integumentary Integumentary Symptoms Reported: No symptoms reported    Musculoskeletal Musculoskelatal Symptoms Reviewed: No symptoms reported   Falls in the past year?: No    Psychosocial  Additional Psychological Details: States he has a Case Manager through his ID office, she has been with him for 1.5 years, she sometimes takes him to MD appointments if he doesn't have a ride, assisting with Disability paperwork, reviews and assists with medications if he needs. Behavioral Health Self-Management Outcome: 4 (good)   Quality of Family Relationships: supportive      01/21/2024    2:30 PM  Depression screen PHQ 2/9  Decreased Interest 0  Down, Depressed, Hopeless 0  PHQ - 2 Score 0    There were no vitals filed for this visit.  Medications Reviewed Today     Reviewed by Lucian Santana LABOR, RN (Registered Nurse) on 01/21/24 at 1505  Med List Status: <None>   Medication Order Taking? Sig Documenting Provider Last Dose Status Informant  aspirin  EC 81 MG tablet [id number] Yes Take 1 tablet (81 mg total) by mouth daily. Verdene Purchase, MD  Active Self, Pharmacy Records  bismuth  subsalicylate (PEPTO BISMOL) 262 MG chewable tablet [id number] Yes Chew 1 tablet (262 mg total) by mouth as needed for indigestion (nausea). Verdene Purchase, MD  Active Self, Pharmacy Records  cabotegravir  & rilpivirine  ER (CABENUVA ) 600 & 900 MG/3ML injection [id number] Yes Inject 1 kit into the muscle every 2 (two) months. Waddell Alan PARAS, RPH-CPP  Active   empagliflozin  (JARDIANCE ) 10 MG TABS tablet [id number] Yes Take 1 tablet (10 mg total) by mouth daily. Verdene Purchase, MD  Active Self, Pharmacy Records           Med Note LEOBARDO, NICOLE   Fri Oct 31, 2023 11:23 PM) Medication  sent to wrong pharmacy; he uses the [facility] and would like this one filled by [facility] upon discharge if possible.    fexofenadine  (ALLEGRA ) 180 MG tablet [id number] Yes Take 1 tablet (180 mg total) by mouth daily. Verdene Purchase, MD  Active Self, Pharmacy Records  furosemide  (LASIX ) 20 MG tablet [id number]  Take 1 tablet (20 mg total) by mouth daily. Jonel Lonni SQUIBB, MD  Active   hydrocortisone 2.5 % cream [id number] Yes  Apply topically. [provider]  Active   sacubitril -valsartan  (ENTRESTO ) 24-26 MG [id number] Yes Take 1 tablet by mouth 2 (two) times daily. Verdene Purchase, MD  Active Self, Pharmacy Records  spironolactone  (ALDACTONE ) 25 MG tablet [id number] Yes Take 1 tablet (25 mg total) by mouth daily. Verdene Purchase, MD  Active Self, Pharmacy Records           Med Note (LEE, NICOLE   Fri Oct 31, 2023 11:25 PM) Pt took last dose of medication yesterday at home; no refills noted on file.              Recommendation:   -Specialty provider follow-up : Dr. Fleeta Rothman at ID scheduled 02/04/24 -New patient PCP appointment scheduled for 02/26/24 with Dr. Jesus at [facility] at [place].   -Patient will continue to work with Jacqulynn, Case Manager through [facility].   Follow Up Plan:   Telephone follow-up two weeks.  Santana Stamp BSN, CCM [place]  VBCI Population Health RN Care Manager Direct Dial: [phone]  Fax: [phone]

## 2024-01-21 NOTE — Patient Instructions (Signed)
 Visit Information  Dear Micheal Leonard,   Below are contact numbers to local dentists that may take your health insurance:   [facility] MEDICAID DENTAL PROVIDERS:   [facility]  [address], [place], [place] [address]  [phone]  [facility] [address] [place], [place] [address] [phone] [facility] Dept of Social Services [address] [place], [place] [address]: [phone]  [facility]  [address] [place], [place] [address]   [phone] [facility] Tanda Fanny, DDS.  [address] [place], [place] [address]  [phone] [facility] [place] [address] [place], [place] [address] [phone]         Micheal Leonard was given information about Medicaid Managed Care team care coordination services as a part of their [employer] Medicaid benefit. Micheal Leonard verbally consentedto engagement with the [facility] Managed Care team.   If you are experiencing a medical emergency, please call 911 or report to your local emergency department or urgent care.   If you have a non-emergency medical problem during routine business hours, please contact your provider's office and ask to speak with a nurse.   For questions related to your [facility] health plan, please call: [phone]  OR visit the member homepage at: [email or web address]  If you would like to schedule transportation through your [facility] plan, please call the following number at least 2 days in advance of your appointment: [phone]  If you are experiencing a behavioral health crisis, call the [employer] [place]  Behavioral Health Crisis Line at [phone]). The line is available 24 hours a day, seven days a week.  If you would like help to quit smoking, call 1-800-QUIT-NOW (3195135556) OR Espaol:  1-855-Djelo-Ya (8-144-664-6430) o para ms informacin haga clic aqu or Text READY to 799-599 to register via text   Micheal Leonard - following are the goals we discussed in your visit today:    Goals Addressed             This Visit's Progress    VBCI RN Care Plan       Problems:  Lacks a Primary Care Provider  Goal: Over the next 45 days the Patient will be established with a Primary Care Provider   Interventions:   Evaluation of current treatment plan related to lacks Primary Care Provider, self-management and patient's adherence to plan as established by provider. Discussed plans with patient for ongoing care management follow up and provided patient with direct contact information for care management team Assessed patient for acute health and SDOH needs, denies needs at this time.   Patient Self-Care Activities:  Attend all scheduled provider appointments Call pharmacy for medication refills 3-7 days in advance of running out of medications Call provider office for new concerns or questions  Patient will continue to work with Micheal Leonard, Case Manager with [facility] for [facility], states he has been under her care for 1.5 years.    Plan:  Telephone follow up appointment with care management team member scheduled for:  two weeks.              Patient verbalizes understanding of instructions and care plan provided today and agrees to view in MyChart. Active MyChart status and patient understanding of how to access instructions and care plan via MyChart confirmed with patient.     Telephone follow up appointment with Managed Medicaid care management team member  scheduled qnm:Ulzdijb, July 22nd at 1:00pm.   Micheal Leonard BSN, CCM [place]  [facility] Population Health RN Care Manager Direct Dial: [phone]  Fax: [phone]   Following is a copy of your plan of care:  There are no care plans that you recently modified to display for this  patient.

## 2024-01-26 ENCOUNTER — Other Ambulatory Visit (HOSPITAL_COMMUNITY): Payer: Self-pay

## 2024-01-26 ENCOUNTER — Other Ambulatory Visit: Payer: Self-pay

## 2024-01-26 DIAGNOSIS — Z8774 Personal history of (corrected) congenital malformations of heart and circulatory system: Secondary | ICD-10-CM | POA: Diagnosis not present

## 2024-01-26 DIAGNOSIS — Q224 Congenital tricuspid stenosis: Secondary | ICD-10-CM | POA: Diagnosis not present

## 2024-01-26 DIAGNOSIS — I519 Heart disease, unspecified: Secondary | ICD-10-CM | POA: Diagnosis not present

## 2024-01-26 DIAGNOSIS — F819 Developmental disorder of scholastic skills, unspecified: Secondary | ICD-10-CM | POA: Diagnosis not present

## 2024-01-26 NOTE — Progress Notes (Signed)
 Specialty Pharmacy Refill Coordination Note  Micheal Leonard is a 28 y.o. male assessed today regarding refills of clinic administered specialty medication(s) Cabotegravir  & Rilpivirine  (CABENUVA )   Clinic requested Courier to Provider Office   Delivery date: 01/29/24   Verified address: [address] [place] [address]   Medication will be filled on 01/28/24.

## 2024-01-28 ENCOUNTER — Other Ambulatory Visit: Payer: Self-pay

## 2024-01-29 ENCOUNTER — Telehealth: Payer: Self-pay

## 2024-01-29 NOTE — Telephone Encounter (Signed)
 [facility] Patient Advocate Encounter  Patient's medications CABENUVA  have been couriered to [facility] from [facility] and will be administered at the patients appointment on 02/04/24.  Charmaine Sharps, CPhT Specialty Pharmacy Patient [facility] for Infectious Disease Phone: [phone] Fax:  [phone]

## 2024-02-03 ENCOUNTER — Other Ambulatory Visit: Payer: Self-pay

## 2024-02-03 NOTE — Patient Instructions (Signed)
 Visit Information  Mr. Harmsen was given information about Medicaid Managed Care team care coordination services as a part of their [employer] Medicaid benefit. Burgess Zinn verbally consentedto engagement with the [facility] Managed Care team.   If you are experiencing a medical emergency, please call 911 or report to your local emergency department or urgent care.   If you have a non-emergency medical problem during routine business hours, please contact your provider's office and ask to speak with a nurse.   For questions related to your [facility] health plan, please call: [phone]  OR visit the member homepage at: [email or web address]  If you would like to schedule transportation through your [employer] [facility] plan, please call the following number at least 2 days in advance of your appointment: [phone]  If you are experiencing a behavioral health crisis, call the [employer] [place]  Behavioral Health Crisis Line at [phone]). The line is available 24 hours a day, seven days a week.  If you would like help to quit smoking, call 1-800-QUIT-NOW (202-298-6321) OR Espaol: 1-855-Djelo-Ya (8-144-664-6430) o para ms informacin haga clic aqu or Text READY to 799-599 to register via text   Mr. Brueggemann - following are the goals we discussed in your visit today:    Goals Addressed             This Visit's Progress    COMPLETED: VBCI RN Care Plan       Problems:  Lacks a Primary Care Provider  Goal: Over the next 45 days the Patient will be established with a Primary Care Provider   Interventions:   Evaluation of current treatment plan related to lacks Primary Care Provider, self-management and patient's adherence to plan as established by provider. Discussed plans with patient for ongoing care management follow up and provided patient with direct  contact information for care management team Assessed patient for acute health and SDOH needs, denies needs at this time.   Patient Self-Care Activities:  Attend all scheduled provider appointments Call pharmacy for medication refills 3-7 days in advance of running out of medications Call provider office for new concerns or questions  Patient will continue to work with Jacqulynn, Case Manager with [facility] for Infectious Disease, states he has been under her care for 1.5 years.   Confirmed patient has new patient appt with Dr. Jesus at [facility] on 02/26/24.   Plan:  No further follow up required: patient continues to work with Case Manager through Infectious Disease office.                Patient verbalizes understanding of instructions and care plan provided today and agrees to view in MyChart. Active MyChart status and patient understanding of how to access instructions and care plan via MyChart confirmed with patient.     No further follow up required: Patient will continue case management services with Case Manager, Sherese through Infectious Disease office.   Santana Stamp BSN, CCM [place]  VBCI Population Health RN Care Manager Direct Dial: [phone]  Fax: [phone]   Following is a copy of your plan of care:  There are no care plans that you recently modified to display for this patient.

## 2024-02-03 NOTE — Patient Outreach (Addendum)
 Complex Care Management   Visit Note  02/03/2024  Name:  Micheal Leonard MRN: [id number] DOB: [birth year]  Situation: Referral received for Complex Care Management related to High Risk. I obtained verbal consent from Patient.  Visit completed with Micheal Leonard  on the phone.  No states concerns today.  No problems obtaining medications. No pain to report.   Background:   Past Medical History:  Diagnosis Date   Cardiac anomaly, congenital    Diarrhea 01/22/2017   Dizziness 05/30/2020   EBV infection    hx/notes 10/23/2016   Erectile dysfunction 05/30/2020   Gonorrhea 07/20/2019   Hemoptysis 10/23/2016   thelbert 10/23/2016   HIV (human immunodeficiency virus infection) (HCC)    Hypertension    Hypertension 04/30/2021   Migraine    a few/year now; frequency is less than in the past (10/23/2016)   Nonadherence to medical treatment 09/08/2019   Pharyngitis 10/23/2016   notes 10/23/2016   Pulmonary embolism (HCC) 01/29/2016   S/P bidirectional Glenn shunt    hx/notes 10/23/2016   S/P Fontan procedure    hx/notes 10/23/2016   Syphilis 07/20/2019   Tricuspid atresia and stenosis, congenital     Assessment: Patient Reported Symptoms:  Cognitive Cognitive Status: No symptoms reported      Neurological Neurological Review of Symptoms: Not assessed    HEENT HEENT Symptoms Reported: Not assessed      Cardiovascular Cardiovascular Symptoms Reported: Not assessed    Respiratory Respiratory Symptoms Reported: No symptoms reported    Endocrine Endocrine Symptoms Reported: Not assessed    Gastrointestinal Gastrointestinal Symptoms Reported: Not assessed      Genitourinary Genitourinary Symptoms Reported: No symptoms reported    Integumentary Integumentary Symptoms Reported: No symptoms reported    Musculoskeletal Musculoskelatal Symptoms Reviewed: No symptoms reported        Psychosocial Psychosocial Symptoms Reported: No symptoms reported            01/21/2024    2:30 PM   Depression screen PHQ 2/9  Decreased Interest 0  Down, Depressed, Hopeless 0  PHQ - 2 Score 0    There were no vitals filed for this visit.  Medications Reviewed Today   Medications were not reviewed in this encounter     Recommendation:   Continue Current Plan of Care -Patient will attend new patient appt with Dr. Jesus at [facility] -Patient will continue to work with Dortha, Sports coach through ID office.   Follow Up Plan:   Patient has met all care management goals. Care Management case will be closed. Patient has been provided contact information should new needs arise.   Santana Stamp BSN, CCM [place]  VBCI Population Health RN Care Manager Direct Dial: [phone]  Fax: [phone]

## 2024-02-03 NOTE — Progress Notes (Deleted)
 HPI: Micheal Leonard is a 28 y.o. male who presents to the [facility] pharmacy clinic for Cabenuva  administration.  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   Dizziness 05/30/2020   Erectile dysfunction 05/30/2020   Nonadherence to medical treatment 09/08/2019   Syphilis 07/20/2019   Gonorrhea 07/20/2019   Diarrhea 01/22/2017   Bleeding gums 11/02/2016   Sinus tachycardia 11/02/2016   Cognitive developmental delay 10/25/2016   Acute pulmonary embolism (HCC) 10/24/2016   HIV disease (HCC) 10/24/2016   Chlamydial infection of pharynx 10/23/2016   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   BISMUTH  SUBSALICYLATE (PEPTO BISMOL) 262 MG CHEWABLE TABLET    Chew 1 tablet (262 mg total) by mouth as needed for indigestion (nausea).   CABOTEGRAVIR  & RILPIVIRINE  ER (CABENUVA ) 600 & 900 MG/3ML INJECTION    Inject 1 kit into the muscle every 2 (two) months.   EMPAGLIFLOZIN  (JARDIANCE ) 10 MG TABS TABLET    Take 1 tablet (10 mg total) by mouth daily.   FEXOFENADINE  (ALLEGRA ) 180 MG TABLET    Take 1 tablet (180 mg total) by mouth daily.   FUROSEMIDE  (LASIX ) 20 MG TABLET    Take 1 tablet (20 mg total) by mouth daily.   HYDROCORTISONE 2.5 % CREAM    Apply topically.   SACUBITRIL -VALSARTAN  (ENTRESTO ) 24-26 MG    Take 1 tablet by mouth 2 (two) times daily.    SPIRONOLACTONE  (ALDACTONE ) 25 MG TABLET    Take 1 tablet (25 mg total) by mouth daily.  Modified Medications   No medications on file  Discontinued Medications   No medications on file    Allergies: Allergies  Allergen Reactions   Other     An HIV drug and can not remember  exactly.    Labs: Lab Results  Component Value Date   HIV1RNAQUANT <20 DETECTED (A) 12/03/2023   HIV1RNAQUANT Not Detected 08/06/2023   HIV1RNAQUANT Not Detected 02/12/2023   CD4TABS 330 (L) 08/06/2023   CD4TABS 384 (L) 02/12/2023   CD4TABS 267 (L) 05/08/2022    RPR and STI Lab Results  Component Value Date   LABRPR REACTIVE (A) 12/03/2023   LABRPR REACTIVE (A) 02/12/2023   LABRPR REACTIVE (A) 12/04/2022   LABRPR REACTIVE (A) 04/10/2022   LABRPR REACTIVE (A) 01/09/2022   RPRTITER 1:2 (H) 12/03/2023   RPRTITER 1:2 (H) 02/12/2023   RPRTITER 1:4 (H) 12/04/2022   RPRTITER 1:4 (H) 04/10/2022   RPRTITER 1:4 (H) 01/09/2022    STI Results GC CT  02/12/2023  2:08 PM Negative    Negative    Negative  Negative    Negative    Negative   12/04/2022  2:02 PM Negative    Negative    Negative  Negative    Negative    Negative   01/09/2022  9:40 AM Negative    Negative  Negative  Negative    Negative    Negative   04/30/2021 11:11 AM Negative    Negative    Negative  Negative    Positive    Negative   11/24/2020 10:57 AM Negative    Negative    Negative  Negative    Negative    Negative   08/24/2020  8:42 AM Negative    Negative    Negative  Negative    Negative    Negative   06/07/2020 11:16 AM Negative    Negative    Negative  Negative    Negative    Negative   11/02/2019  1:46 PM Negative  Negative   06/16/2019  1:38 PM Negative    Positive    Negative  Negative    Positive    Negative   12/24/2018 12:00 AM Negative    Negative    Negative  Negative    Negative    Negative   07/17/2018 12:00 AM **POSITIVE**    Negative    Negative  **POSITIVE**    Negative    Negative    04/30/2018 12:00 AM Negative  Negative   01/23/2018 12:00 AM Negative    Negative  Negative    Negative   11/27/2017 12:00 AM Negative    Negative    Negative  Negative    Negative    Negative   09/07/2017 12:00 AM **POSITIVE**  Negative   05/05/2017 12:00 AM Negative  **POSITIVE**   01/22/2017 12:00 AM Negative    Negative    Negative  Negative    Negative    Negative   10/25/2016 12:00 AM Negative  **POSITIVE**   10/24/2016 12:00 AM Negative    Negative  Negative    Negative     Hepatitis B Lab Results  Component Value Date   HEPBSAB REACTIVE (A) 12/05/2021   HEPBSAG NON-REACTIVE 01/25/2020   HEPBCAB NON-REACTIVE 01/25/2020   Hepatitis C Lab Results  Component Value Date   HEPCAB NON-REACTIVE 07/02/2019   Hepatitis A Lab Results  Component Value Date   HAV Positive (A) 10/25/2016   Lipids: Lab Results  Component Value Date   CHOL 190 08/06/2023   TRIG 73 08/06/2023   HDL 42 08/06/2023   CHOLHDL 4.5 08/06/2023   VLDL 17 01/22/2017   LDLCALC 131 (H) 08/06/2023    TARGET DATE: The 27th  Assessment: Esley presents today for his maintenance Cabenuva  injections. Past injections were tolerated well without issues. Last HIV RNA was undetectable in May. STI testing was also negative in May. Doing well with no issues today.  Administered cabotegravir  600mg /23mL in left upper outer quadrant of the gluteal muscle. Administered rilpivirine  900 mg/3mL in the right upper outer quadrant of the gluteal muscle. No issues with injections. He will follow up in 2 months for next set of injections.  Plan: - Cabenuva  injections administered - Next injections scheduled for *** - Call with any issues or questions  Keenan Trefry L. Toshika Parrow, PharmD, BCIDP, AAHIVP, CPP Clinical Pharmacist Practitioner - Infectious Diseases Clinical Pharmacist Lead - Specialty Pharmacy [facility] for Infectious Disease

## 2024-02-04 ENCOUNTER — Encounter: Payer: Self-pay | Admitting: Pharmacist

## 2024-02-12 ENCOUNTER — Telehealth: Payer: Self-pay

## 2024-02-12 NOTE — Telephone Encounter (Signed)
 Patient called, he missed his Cabenuva  appointment on 7/23. TD is 7/27. Scheduled with Alan tomorrow 8/1, he is aware he needs to keep this appointment to remain within his window.   Tasfia Vasseur, BSN, RN

## 2024-02-12 NOTE — Progress Notes (Unsigned)
 HPI: Micheal Leonard is a 28 y.o. male who presents to the [facility] pharmacy clinic for Cabenuva  administration.  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   Dizziness 05/30/2020   Erectile dysfunction 05/30/2020   Nonadherence to medical treatment 09/08/2019   Syphilis 07/20/2019   Gonorrhea 07/20/2019   Diarrhea 01/22/2017   Bleeding gums 11/02/2016   Sinus tachycardia 11/02/2016   Cognitive developmental delay 10/25/2016   Acute pulmonary embolism (HCC) 10/24/2016   HIV disease (HCC) 10/24/2016   Chlamydial infection of pharynx 10/23/2016   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   BISMUTH  SUBSALICYLATE (PEPTO BISMOL) 262 MG CHEWABLE TABLET    Chew 1 tablet (262 mg total) by mouth as needed for indigestion (nausea).   CABOTEGRAVIR  & RILPIVIRINE  ER (CABENUVA ) 600 & 900 MG/3ML INJECTION    Inject 1 kit into the muscle every 2 (two) months.   EMPAGLIFLOZIN  (JARDIANCE ) 10 MG TABS TABLET    Take 1 tablet (10 mg total) by mouth daily.   FEXOFENADINE  (ALLEGRA ) 180 MG TABLET    Take 1 tablet (180 mg total) by mouth daily.   FUROSEMIDE  (LASIX ) 20 MG TABLET    Take 1 tablet (20 mg total) by mouth daily.   HYDROCORTISONE 2.5 % CREAM    Apply topically.   SACUBITRIL -VALSARTAN  (ENTRESTO ) 24-26 MG    Take 1 tablet by mouth 2 (two) times daily.    SPIRONOLACTONE  (ALDACTONE ) 25 MG TABLET    Take 1 tablet (25 mg total) by mouth daily.  Modified Medications   No medications on file  Discontinued Medications   No medications on file    Allergies: Allergies  Allergen Reactions   Other     An HIV drug and can not remember  exactly.    Past Medical History: Past Medical History:  Diagnosis Date   Cardiac anomaly, congenital    Diarrhea 01/22/2017   Dizziness 05/30/2020   EBV infection    hx/notes 10/23/2016   Erectile dysfunction 05/30/2020   Gonorrhea 07/20/2019   Hemoptysis 10/23/2016   thelbert 10/23/2016   HIV (human immunodeficiency virus infection) (HCC)    Hypertension    Hypertension 04/30/2021   Migraine    a few/year now; frequency is less than in the past (10/23/2016)   Nonadherence to medical treatment 09/08/2019   Pharyngitis 10/23/2016   notes 10/23/2016   Pulmonary embolism (HCC) 01/29/2016   S/P bidirectional Glenn shunt    hx/notes 10/23/2016   S/P Fontan procedure    hx/notes 10/23/2016   Syphilis 07/20/2019   Tricuspid atresia and stenosis, congenital     Social History: Social History   Socioeconomic History   Marital status: Significant Other    Spouse name: Not on file   Number of children: Not on file   Years of education: Not on file   Highest education level: Not on  file  Occupational History   Not on file  Tobacco Use   Smoking status: Never   Smokeless tobacco: Never  Vaping Use   Vaping status: Never Used  Substance and Sexual Activity   Alcohol use: Not Currently    Comment: 2 GLASSES A WEEK   Drug use: Not Currently    Frequency: 2.0 times per week    Types: Marijuana   Sexual activity: Yes    Partners: Male    Birth control/protection: Condom    Comment: declined condoms  Other Topics Concern   Not on file  Social History Narrative   Not on file   Social Drivers of Health   Financial Resource Strain: Low Risk  (11/22/2023)   Received from [facility]    Overall Financial Resource Strain (CARDIA)    Difficulty of Paying Living Expenses: Not hard at all  Food Insecurity: No Food Insecurity (01/21/2024)   Hunger Vital Sign    Worried About Running Out of Food in the Last Year: Never true    Ran Out of Food in the Last Year: Never true  Transportation Needs: No Transportation Needs (01/21/2024)   PRAPARE - Administrator, Civil Service (Medical): No    Lack of Transportation (Non-Medical): No  Recent Concern: Transportation Needs - Unmet Transportation Needs (11/22/2023)   Received [facility] - Transportation    In the past 12 months, has lack of transportation kept you from medical appointments or from getting medications?: Yes    Lack of Transportation (Non-Medical): Not on file  Physical Activity: Not on file  Stress: Not on file  Social Connections: Unknown (10/17/2023)   Social Connection and Isolation Panel    Frequency of Communication with Friends and Family: Never    Frequency of Social Gatherings with Friends and Family: Never    Attends Religious Services: Never    Diplomatic Services operational officer: Not on file    Attends Banker Meetings: Never    Marital Status: Not on file    Labs: Lab Results  Component Value Date   HIV1RNAQUANT <20 DETECTED (A) 12/03/2023   HIV1RNAQUANT Not Detected 08/06/2023   HIV1RNAQUANT Not Detected 02/12/2023   CD4TABS 330 (L) 08/06/2023   CD4TABS 384 (L) 02/12/2023   CD4TABS 267 (L) 05/08/2022    RPR and STI Lab Results  Component Value Date   LABRPR REACTIVE (A) 12/03/2023   LABRPR REACTIVE (A) 02/12/2023   LABRPR REACTIVE (A) 12/04/2022   LABRPR REACTIVE (A) 04/10/2022   LABRPR REACTIVE (A) 01/09/2022   RPRTITER 1:2 (H) 12/03/2023   RPRTITER 1:2 (H) 02/12/2023   RPRTITER 1:4 (H) 12/04/2022   RPRTITER 1:4 (H) 04/10/2022   RPRTITER 1:4 (H) 01/09/2022    STI Results GC CT  02/12/2023  2:08 PM Negative    Negative     Negative  Negative    Negative    Negative   12/04/2022  2:02 PM Negative    Negative    Negative  Negative    Negative    Negative   01/09/2022  9:40 AM Negative    Negative    Negative  Negative    Negative    Negative   04/30/2021 11:11 AM Negative    Negative    Negative  Negative    Positive    Negative   11/24/2020 10:57 AM Negative    Negative    Negative  Negative  Negative    Negative   08/24/2020  8:42 AM Negative    Negative    Negative  Negative    Negative    Negative   06/07/2020 11:16 AM Negative    Negative    Negative  Negative    Negative    Negative   11/02/2019  1:46 PM Negative  Negative   06/16/2019  1:38 PM Negative    Positive    Negative  Negative    Positive    Negative   12/24/2018 12:00 AM Negative    Negative    Negative  Negative    Negative    Negative   07/17/2018 12:00 AM **POSITIVE**    Negative    Negative  **POSITIVE**    Negative    Negative   04/30/2018 12:00 AM Negative  Negative   01/23/2018 12:00 AM Negative    Negative  Negative    Negative   11/27/2017 12:00 AM Negative    Negative    Negative  Negative    Negative    Negative   09/07/2017 12:00 AM **POSITIVE**  Negative   05/05/2017 12:00 AM Negative  **POSITIVE**   01/22/2017 12:00 AM Negative    Negative    Negative  Negative    Negative    Negative   10/25/2016 12:00 AM Negative  **POSITIVE**   10/24/2016 12:00 AM Negative    Negative  Negative    Negative     Hepatitis B Lab Results  Component Value Date   HEPBSAB REACTIVE (A) 12/05/2021   HEPBSAG NON-REACTIVE 01/25/2020   HEPBCAB NON-REACTIVE 01/25/2020   Hepatitis C Lab Results  Component Value Date   HEPCAB NON-REACTIVE 07/02/2019   Hepatitis A Lab Results  Component Value Date   HAV Positive (A) 10/25/2016   Lipids: Lab Results  Component Value Date   CHOL 190 08/06/2023   TRIG 73 08/06/2023   HDL 42 08/06/2023   CHOLHDL 4.5 08/06/2023   VLDL 17 01/22/2017   LDLCALC  131 (H) 08/06/2023    TARGET DATE:  The 27th of the month  Assessment: Lochlann presents today for their maintenance Cabenuva  injections. Initial/past injections were tolerated well without issues. No problems with systemic effects of injections.   Administered cabotegravir  600mg /27mL in left upper outer quadrant of the gluteal muscle. Administered rilpivirine  900 mg/3mL in the right upper outer quadrant of the gluteal muscle. Monitored patient for 10 minutes after injection. Injections were tolerated well without issue. Patient will follow up in 2 months for next injection.  Eligible for Menveo and PCV20 which he defers today.   Plan: - Cabenuva  injections administered - Next injections scheduled for 9/24 with Cassie and 11/26 with Dr. Fleeta Rothman  - Call with any issues or questions  Alan Geralds, PharmD, CPP, BCIDP, AAHIVP Clinical Pharmacist Practitioner Infectious Diseases Clinical Pharmacist [facility]

## 2024-02-13 ENCOUNTER — Other Ambulatory Visit: Payer: Self-pay

## 2024-02-13 ENCOUNTER — Ambulatory Visit: Admitting: Pharmacist

## 2024-02-13 DIAGNOSIS — Z113 Encounter for screening for infections with a predominantly sexual mode of transmission: Secondary | ICD-10-CM

## 2024-02-13 DIAGNOSIS — B20 Human immunodeficiency virus [HIV] disease: Secondary | ICD-10-CM | POA: Diagnosis present

## 2024-02-13 MED ORDER — CABOTEGRAVIR & RILPIVIRINE ER 600 & 900 MG/3ML IM SUER
1.0000 | Freq: Once | INTRAMUSCULAR | Status: AC
  Administered 2024-02-13: 1 via INTRAMUSCULAR

## 2024-02-26 ENCOUNTER — Ambulatory Visit: Admitting: Internal Medicine

## 2024-03-12 DIAGNOSIS — I420 Dilated cardiomyopathy: Secondary | ICD-10-CM | POA: Diagnosis not present

## 2024-03-12 DIAGNOSIS — I10 Essential (primary) hypertension: Secondary | ICD-10-CM | POA: Diagnosis not present

## 2024-03-12 DIAGNOSIS — Z8774 Personal history of (corrected) congenital malformations of heart and circulatory system: Secondary | ICD-10-CM | POA: Diagnosis not present

## 2024-03-12 DIAGNOSIS — Q224 Congenital tricuspid stenosis: Secondary | ICD-10-CM | POA: Diagnosis not present

## 2024-03-30 DIAGNOSIS — Z8774 Personal history of (corrected) congenital malformations of heart and circulatory system: Secondary | ICD-10-CM | POA: Diagnosis not present

## 2024-03-30 DIAGNOSIS — K9189 Other postprocedural complications and disorders of digestive system: Secondary | ICD-10-CM | POA: Diagnosis not present

## 2024-03-30 DIAGNOSIS — K769 Liver disease, unspecified: Secondary | ICD-10-CM | POA: Diagnosis not present

## 2024-03-31 ENCOUNTER — Other Ambulatory Visit (HOSPITAL_COMMUNITY): Payer: Self-pay

## 2024-03-31 ENCOUNTER — Ambulatory Visit: Admitting: Family Medicine

## 2024-03-31 ENCOUNTER — Other Ambulatory Visit: Payer: Self-pay

## 2024-03-31 NOTE — Progress Notes (Signed)
 Specialty Pharmacy Refill Coordination Note  Micheal Leonard is a 28 y.o. male assessed today regarding refills of clinic administered specialty medication(s) Cabotegravir  & Rilpivirine  (CABENUVA )   Clinic requested Courier to Provider Office   Delivery date: 04/05/24   Verified address: [address] [place] [address]   Medication will be filled on 04/02/24.

## 2024-04-02 ENCOUNTER — Other Ambulatory Visit: Payer: Self-pay

## 2024-04-05 ENCOUNTER — Telehealth: Payer: Self-pay

## 2024-04-05 NOTE — Telephone Encounter (Signed)
 [facility] Patient Advocate Encounter  Patient's medications Cabenuva  have been couriered to [facility] from [facility] and will be administered at the patients appointment on 04/07/24.  Arland Hutchinson, CPhT Specialty Pharmacy Patient [facility] for Infectious Disease Phone: [phone] Fax:  [phone]

## 2024-04-06 ENCOUNTER — Other Ambulatory Visit (HOSPITAL_COMMUNITY): Payer: Self-pay

## 2024-04-07 ENCOUNTER — Ambulatory Visit: Admitting: Pharmacist

## 2024-04-12 NOTE — Progress Notes (Unsigned)
 HPI: Micheal Leonard is a 28 y.o. male who presents to the [facility] pharmacy clinic for Cabenuva  administration.  Referring ID Physician: Dr. 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   Dizziness 05/30/2020   Erectile dysfunction 05/30/2020   Nonadherence to medical treatment 09/08/2019   Syphilis 07/20/2019   Gonorrhea 07/20/2019   Diarrhea 01/22/2017   Bleeding gums 11/02/2016   Sinus tachycardia 11/02/2016   Cognitive developmental delay 10/25/2016   Acute pulmonary embolism (HCC) 10/24/2016   HIV disease (HCC) 10/24/2016   Chlamydial infection of pharynx 10/23/2016   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   BISMUTH  SUBSALICYLATE (PEPTO BISMOL) 262 MG CHEWABLE TABLET    Chew 1 tablet (262 mg total) by mouth as needed for indigestion (nausea).   CABOTEGRAVIR  & RILPIVIRINE  ER (CABENUVA ) 600 & 900 MG/3ML INJECTION    Inject 1 kit into the muscle every 2 (two) months.   EMPAGLIFLOZIN  (JARDIANCE ) 10 MG TABS TABLET    Take 1 tablet (10 mg total) by mouth daily.   FEXOFENADINE  (ALLEGRA ) 180 MG TABLET    Take 1 tablet (180 mg total) by mouth daily.   FUROSEMIDE  (LASIX ) 20 MG TABLET    Take 1 tablet (20 mg total) by mouth daily.   HYDROCORTISONE 2.5 % CREAM    Apply topically.   SACUBITRIL -VALSARTAN  (ENTRESTO ) 24-26 MG    Take 1 tablet  by mouth 2 (two) times daily.   SPIRONOLACTONE  (ALDACTONE ) 25 MG TABLET    Take 1 tablet (25 mg total) by mouth daily.  Modified Medications   No medications on file  Discontinued Medications   No medications on file    Allergies: Allergies  Allergen Reactions   Other     An HIV drug and can not remember  exactly.    Past Medical History: Past Medical History:  Diagnosis Date   Cardiac anomaly, congenital    Diarrhea 01/22/2017   Dizziness 05/30/2020   EBV infection    hx/notes 10/23/2016   Erectile dysfunction 05/30/2020   Gonorrhea 07/20/2019   Hemoptysis 10/23/2016   thelbert 10/23/2016   HIV (human immunodeficiency virus infection) (HCC)    Hypertension    Hypertension 04/30/2021   Migraine    a few/year now; frequency is less than in the past (10/23/2016)   Nonadherence to medical treatment 09/08/2019   Pharyngitis 10/23/2016   notes 10/23/2016   Pulmonary embolism (HCC) 01/29/2016   S/P bidirectional Glenn shunt    hx/notes 10/23/2016   S/P Fontan procedure    hx/notes 10/23/2016   Syphilis 07/20/2019   Tricuspid atresia and stenosis, congenital     Social History: Social History   Socioeconomic History   Marital status: Significant Other    Spouse name: Not on file   Number of children: Not on file   Years of education: Not on file  Highest education level: Not on file  Occupational History   Not on file  Tobacco Use   Smoking status: Never   Smokeless tobacco: Never  Vaping Use   Vaping status: Never Used  Substance and Sexual Activity   Alcohol use: Not Currently    Comment: 2 GLASSES A WEEK   Drug use: Not Currently    Frequency: 2.0 times per week    Types: Marijuana   Sexual activity: Yes    Partners: Male    Birth control/protection: Condom    Comment: declined condoms  Other Topics Concern   Not on file  Social History Narrative   Not on file   Social Drivers of Health   Financial Resource Strain: Low Risk  (11/22/2023)   Received from  [facility]   Overall Financial Resource Strain (CARDIA)    Difficulty of Paying Living Expenses: Not hard at all  Food Insecurity: No Food Insecurity (01/21/2024)   Hunger Vital Sign    Worried About Running Out of Food in the Last Year: Never true    Ran Out of Food in the Last Year: Never true  Transportation Needs: No Transportation Needs (01/21/2024)   PRAPARE - Administrator, Civil Service (Medical): No    Lack of Transportation (Non-Medical): No  Recent Concern: Transportation Needs - Unmet Transportation Needs (11/22/2023)   Received from [facility] - Transportation    In the past 12 months, has lack of transportation kept you from medical appointments or from getting medications?: Yes    Lack of Transportation (Non-Medical): Not on file  Physical Activity: Not on file  Stress: Not on file  Social Connections: Unknown (10/17/2023)   Social Connection and Isolation Panel    Frequency of Communication with Friends and Family: Never    Frequency of Social Gatherings with Friends and Family: Never    Attends Religious Services: Never    Diplomatic Services operational officer: Not on file    Attends Banker Meetings: Never    Marital Status: Not on file    Labs: Lab Results  Component Value Date   HIV1RNAQUANT <20 DETECTED (A) 12/03/2023   HIV1RNAQUANT Not Detected 08/06/2023   HIV1RNAQUANT Not Detected 02/12/2023   CD4TABS 330 (L) 08/06/2023   CD4TABS 384 (L) 02/12/2023   CD4TABS 267 (L) 05/08/2022    RPR and STI Lab Results  Component Value Date   LABRPR REACTIVE (A) 12/03/2023   LABRPR REACTIVE (A) 02/12/2023   LABRPR REACTIVE (A) 12/04/2022   LABRPR REACTIVE (A) 04/10/2022   LABRPR REACTIVE (A) 01/09/2022   RPRTITER 1:2 (H) 12/03/2023   RPRTITER 1:2 (H) 02/12/2023   RPRTITER 1:4 (H) 12/04/2022   RPRTITER 1:4 (H) 04/10/2022   RPRTITER 1:4 (H) 01/09/2022    STI Results GC CT  02/12/2023  2:08  PM Negative    Negative    Negative  Negative    Negative    Negative   12/04/2022  2:02 PM Negative    Negative    Negative  Negative    Negative    Negative   01/09/2022  9:40 AM Negative    Negative    Negative  Negative    Negative    Negative   04/30/2021 11:11 AM Negative    Negative    Negative  Negative    Positive    Negative   11/24/2020 10:57 AM Negative    Negative  Negative  Negative    Negative    Negative   08/24/2020  8:42 AM Negative    Negative    Negative  Negative    Negative    Negative   06/07/2020 11:16 AM Negative    Negative    Negative  Negative    Negative    Negative   11/02/2019  1:46 PM Negative  Negative   06/16/2019  1:38 PM Negative    Positive    Negative  Negative    Positive    Negative   12/24/2018 12:00 AM Negative    Negative    Negative  Negative    Negative    Negative   07/17/2018 12:00 AM **POSITIVE**    Negative    Negative  **POSITIVE**    Negative    Negative   04/30/2018 12:00 AM Negative  Negative   01/23/2018 12:00 AM Negative    Negative  Negative    Negative   11/27/2017 12:00 AM Negative    Negative    Negative  Negative    Negative    Negative   09/07/2017 12:00 AM **POSITIVE**  Negative   05/05/2017 12:00 AM Negative  **POSITIVE**   01/22/2017 12:00 AM Negative    Negative    Negative  Negative    Negative    Negative   10/25/2016 12:00 AM Negative  **POSITIVE**   10/24/2016 12:00 AM Negative    Negative  Negative    Negative     Hepatitis B Lab Results  Component Value Date   HEPBSAB REACTIVE (A) 12/05/2021   HEPBSAG NON-REACTIVE 01/25/2020   HEPBCAB NON-REACTIVE 01/25/2020   Hepatitis C Lab Results  Component Value Date   HEPCAB NON-REACTIVE 07/02/2019   Hepatitis A Lab Results  Component Value Date   HAV Positive (A) 10/25/2016   Lipids: Lab Results  Component Value Date   CHOL 190 08/06/2023   TRIG 73 08/06/2023   HDL 42 08/06/2023   CHOLHDL 4.5 08/06/2023    VLDL 17 01/22/2017   LDLCALC 131 (H) 08/06/2023    TARGET DATE:  The 27th of the month  Assessment: Addis presents today for their maintenance Cabenuva  injections. Initial/past injections were tolerated well without issues. No problems with systemic effects of injections.   Administered cabotegravir  600mg /97mL in left upper outer quadrant of the gluteal muscle. Administered rilpivirine  900 mg/3mL in the right upper outer quadrant of the gluteal muscle. Monitored patient for 10 minutes after injection. Injections were tolerated well without issue. Patient will follow up in 2 months for next injection. Will check HIV RNA at next visit with Dr. Fleeta Rothman.  Eligible for multiple vaccinations including annual flu, Menveo, PCV20, and Shingles vaccines. He declines these today.   Plan: - Cabenuva  injections administered - Next injections scheduled for 11/26 with Dr. Fleeta Rothman and 1/30 with me  - Call with any issues or questions  Alan Geralds, PharmD, CPP, BCIDP, AAHIVP Clinical Pharmacist Practitioner Infectious Diseases Clinical Pharmacist [facility]

## 2024-04-14 ENCOUNTER — Other Ambulatory Visit: Payer: Self-pay

## 2024-04-14 ENCOUNTER — Ambulatory Visit (INDEPENDENT_AMBULATORY_CARE_PROVIDER_SITE_OTHER): Admitting: Pharmacist

## 2024-04-14 DIAGNOSIS — B20 Human immunodeficiency virus [HIV] disease: Secondary | ICD-10-CM | POA: Diagnosis not present

## 2024-04-14 MED ORDER — CABOTEGRAVIR & RILPIVIRINE ER 600 & 900 MG/3ML IM SUER
1.0000 | Freq: Once | INTRAMUSCULAR | Status: AC
  Administered 2024-04-14: 1 via INTRAMUSCULAR

## 2024-05-07 ENCOUNTER — Other Ambulatory Visit (HOSPITAL_COMMUNITY): Payer: Self-pay

## 2024-05-26 ENCOUNTER — Other Ambulatory Visit (HOSPITAL_COMMUNITY): Payer: Self-pay

## 2024-05-26 ENCOUNTER — Other Ambulatory Visit: Payer: Self-pay

## 2024-05-26 ENCOUNTER — Telehealth: Payer: Self-pay

## 2024-05-26 NOTE — Telephone Encounter (Signed)
 Received notification from [facility] regarding a prior authorization for Cabenuva . Authorization has been APPROVED from 05/26/24 to 05/26/2025 .   Per test claim, copay for 56 days supply is $0  Patient can continue to fill through [facility]: [phone]   Authorization # [id number]

## 2024-05-26 NOTE — Telephone Encounter (Signed)
 Submitted a Prior Authorization request to [facility] for Cabenuva  via Latent. Will update once we receive a response.  J Code: CPT:  PA ID: [id number]

## 2024-05-26 NOTE — Progress Notes (Signed)
 Specialty Pharmacy Refill Coordination Note  Micheal Leonard is a 28 y.o. male assessed today regarding refills of clinic administered specialty medication(s) Cabotegravir  & Rilpivirine  (CABENUVA )   Clinic requested Courier to Provider Office   Delivery date: 06/07/24   Verified address: [address] [place] [address]   Medication will be filled on 06/04/24.

## 2024-06-02 ENCOUNTER — Other Ambulatory Visit: Payer: Self-pay

## 2024-06-04 ENCOUNTER — Other Ambulatory Visit: Payer: Self-pay

## 2024-06-07 ENCOUNTER — Telehealth: Payer: Self-pay

## 2024-06-07 NOTE — Telephone Encounter (Signed)
 [facility] Patient Advocate Encounter  Patient's medications Cabenuva  have been couriered to [facility] from [facility] and will be administered at the patients appointment on 06/09/24.  Arland Hutchinson, CPhT Specialty Pharmacy Patient [facility] Phone: [phone] Fax:  [phone]

## 2024-06-08 NOTE — Progress Notes (Unsigned)
 Chief complaint: follow-up for HIV disease on medications  Subjective:    Patient ID: Micheal Leonard, male    DOB: 05-Dec-1995, 28 y.o.   MRN: [id number]  HPI  Past Medical History:  Diagnosis Date   Cardiac anomaly, congenital    Diarrhea 01/22/2017   Dizziness 05/30/2020   EBV infection    hx/notes 10/23/2016   Erectile dysfunction 05/30/2020   Gonorrhea 07/20/2019   Hemoptysis 10/23/2016   thelbert 10/23/2016   HIV (human immunodeficiency virus infection) (HCC)    Hypertension    Hypertension 04/30/2021   Migraine    a few/year now; frequency is less than in the past (10/23/2016)   Nonadherence to medical treatment 09/08/2019   Pharyngitis 10/23/2016   notes 10/23/2016   Pulmonary embolism (HCC) 01/29/2016   S/P bidirectional Glenn shunt    hx/notes 10/23/2016   S/P Fontan procedure    hx/notes 10/23/2016   Syphilis 07/20/2019   Tricuspid atresia and stenosis, congenital     Past Surgical History:  Procedure Laterality Date   CARDIAC CATHETERIZATION  01/2016   at [facility]; hx/notes 10/23/2016   CARDIAC SURGERY  01/1997; 2000   fontane procedure at [facility] as a child     Family History  Problem Relation Age of Onset   Hypertension Father       Social History   Socioeconomic History   Marital status: Significant Other    Spouse name: Not on file   Number of children: Not on file   Years of education: Not on file   Highest education level: Not on file  Occupational History   Not on file  Tobacco Use   Smoking status: Never   Smokeless tobacco: Never  Vaping Use   Vaping status: Never Used  Substance and Sexual Activity   Alcohol use: Not Currently    Comment: 2 GLASSES A WEEK   Drug use: Not Currently    Frequency: 2.0 times per week    Types: Marijuana   Sexual activity: Yes    Partners: Male    Birth control/protection: Condom    Comment: declined condoms  Other Topics Concern   Not on file  Social History Narrative   Not on file   Social Drivers of Health    Financial Resource Strain: Low Risk  (11/22/2023)   Received from [facility]   Overall Financial Resource Strain (CARDIA)    Difficulty of Paying Living Expenses: Not hard at all  Food Insecurity: No Food Insecurity (01/21/2024)   Hunger Vital Sign    Worried About Running Out of Food in the Last Year: Never true    Ran Out of Food in the Last Year: Never true  Transportation Needs: No Transportation Needs (01/21/2024)   PRAPARE - Administrator, Civil Service (Medical): No    Lack of Transportation (Non-Medical): No  Recent Concern: Transportation Needs - Unmet Transportation Needs (11/22/2023)   Received from [facility] - Transportation    In the past 12 months, has lack of transportation kept you from medical appointments or from getting medications?: Yes    Lack of Transportation (Non-Medical): Not on file  Physical Activity: Not on file  Stress: Not on file  Social Connections: Unknown (10/17/2023)   Social Connection and Isolation Panel    Frequency of Communication with Friends and Family: Never    Frequency of Social Gatherings with Friends and Family: Never    Attends Religious Services: Never  Active Member of Clubs or Organizations: Not on file    Attends Club or Organization Meetings: Never    Marital Status: Not on file    Allergies  Allergen Reactions   Other     An HIV drug and can not remember  exactly.     Current Outpatient Medications:    aspirin  EC 81 MG tablet, Take 1 tablet (81 mg total) by mouth daily., Disp: 30 tablet, Rfl: 12   bismuth  subsalicylate (PEPTO BISMOL) 262 MG chewable tablet, Chew 1 tablet (262 mg total) by mouth as needed for indigestion (nausea)., Disp: 30 tablet, Rfl: 0   cabotegravir  & rilpivirine  ER (CABENUVA ) 600 & 900 MG/3ML injection, Inject 1 kit into the muscle every 2 (two) months., Disp: 6 mL, Rfl: 5   empagliflozin  (JARDIANCE ) 10 MG TABS tablet, Take 1 tablet (10 mg  total) by mouth daily., Disp: 30 tablet, Rfl: 1   fexofenadine  (ALLEGRA ) 180 MG tablet, Take 1 tablet (180 mg total) by mouth daily., Disp: 30 tablet, Rfl: 0   furosemide  (LASIX ) 20 MG tablet, Take 1 tablet (20 mg total) by mouth daily., Disp: 30 tablet, Rfl: 11   hydrocortisone 2.5 % cream, Apply topically., Disp: , Rfl:    sacubitril -valsartan  (ENTRESTO ) 24-26 MG, Take 1 tablet by mouth 2 (two) times daily., Disp: 60 tablet, Rfl: 0   spironolactone  (ALDACTONE ) 25 MG tablet, Take 1 tablet (25 mg total) by mouth daily., Disp: 30 tablet, Rfl: 0   Review of Systems     Objective:   Physical Exam        Assessment & Plan:

## 2024-06-09 ENCOUNTER — Other Ambulatory Visit: Payer: Self-pay

## 2024-06-09 ENCOUNTER — Other Ambulatory Visit (HOSPITAL_COMMUNITY): Payer: Self-pay

## 2024-06-09 ENCOUNTER — Encounter: Payer: Self-pay | Admitting: Infectious Disease

## 2024-06-09 ENCOUNTER — Ambulatory Visit: Payer: Self-pay | Admitting: Infectious Disease

## 2024-06-09 VITALS — BP 178/99 | HR 114 | Resp 16 | Wt 210.0 lb

## 2024-06-09 DIAGNOSIS — Q224 Congenital tricuspid stenosis: Secondary | ICD-10-CM | POA: Diagnosis not present

## 2024-06-09 DIAGNOSIS — I10 Essential (primary) hypertension: Secondary | ICD-10-CM

## 2024-06-09 DIAGNOSIS — B20 Human immunodeficiency virus [HIV] disease: Secondary | ICD-10-CM | POA: Diagnosis not present

## 2024-06-09 DIAGNOSIS — Z79899 Other long term (current) drug therapy: Secondary | ICD-10-CM

## 2024-06-09 DIAGNOSIS — Z7185 Encounter for immunization safety counseling: Secondary | ICD-10-CM

## 2024-06-09 DIAGNOSIS — Z113 Encounter for screening for infections with a predominantly sexual mode of transmission: Secondary | ICD-10-CM

## 2024-06-09 DIAGNOSIS — Z8774 Personal history of (corrected) congenital malformations of heart and circulatory system: Secondary | ICD-10-CM | POA: Diagnosis not present

## 2024-06-09 DIAGNOSIS — I5023 Acute on chronic systolic (congestive) heart failure: Secondary | ICD-10-CM

## 2024-06-09 MED ORDER — CABOTEGRAVIR & RILPIVIRINE ER 600 & 900 MG/3ML IM SUER
1.0000 | Freq: Once | INTRAMUSCULAR | Status: AC
  Administered 2024-06-09: 1 via INTRAMUSCULAR

## 2024-06-09 MED ORDER — FUROSEMIDE 20 MG PO TABS
20.0000 mg | ORAL_TABLET | Freq: Every day | ORAL | 11 refills | Status: AC
  Filled 2024-06-09: qty 30, 30d supply, fill #0

## 2024-06-09 MED ORDER — SPIRONOLACTONE 25 MG PO TABS
25.0000 mg | ORAL_TABLET | Freq: Every day | ORAL | 0 refills | Status: AC
  Filled 2024-06-09: qty 30, 30d supply, fill #0

## 2024-06-09 MED ORDER — EMPAGLIFLOZIN 10 MG PO TABS
10.0000 mg | ORAL_TABLET | Freq: Every day | ORAL | 1 refills | Status: AC
  Filled 2024-06-09: qty 30, 30d supply, fill #0

## 2024-06-09 MED ORDER — SACUBITRIL-VALSARTAN 24-26 MG PO TABS
1.0000 | ORAL_TABLET | Freq: Two times a day (BID) | ORAL | 0 refills | Status: AC
  Filled 2024-06-09: qty 60, 30d supply, fill #0

## 2024-06-09 NOTE — Patient Instructions (Signed)
 Please make an appt with  your Cardiologist  Rosaline Louder at [address]  [place], [place] [address]  [phone] (Work)  [phone] (Fax)

## 2024-06-10 LAB — CT/NG RNA, TMA RECTAL
Chlamydia Trachomatis RNA: NOT DETECTED
Neisseria Gonorrhoeae RNA: NOT DETECTED

## 2024-06-10 LAB — C. TRACHOMATIS/N. GONORRHOEAE RNA
C. trachomatis RNA, TMA: NOT DETECTED
N. gonorrhoeae RNA, TMA: NOT DETECTED

## 2024-06-10 LAB — GC/CHLAMYDIA PROBE, AMP (THROAT)
Chlamydia trachomatis RNA: NOT DETECTED
Neisseria gonorrhoeae RNA: NOT DETECTED

## 2024-06-15 LAB — CBC WITH DIFFERENTIAL/PLATELET
Absolute Lymphocytes: 1316 {cells}/uL (ref 850–3900)
Absolute Monocytes: 388 {cells}/uL (ref 200–950)
Basophils Absolute: 51 {cells}/uL (ref 0–200)
Basophils Relative: 1.5 %
Eosinophils Absolute: 119 {cells}/uL (ref 15–500)
Eosinophils Relative: 3.5 %
HCT: 50.4 % (ref 39.4–51.1)
Hemoglobin: 16.9 g/dL (ref 13.2–17.1)
MCH: 30.3 pg (ref 27.0–33.0)
MCHC: 33.5 g/dL (ref 31.6–35.4)
MCV: 90.5 fL (ref 81.4–101.7)
MPV: 12.9 fL — ABNORMAL HIGH (ref 7.5–12.5)
Monocytes Relative: 11.4 %
Neutro Abs: 1527 {cells}/uL (ref 1500–7800)
Neutrophils Relative %: 44.9 %
Platelets: 192 Thousand/uL (ref 140–400)
RBC: 5.57 Million/uL (ref 4.20–5.80)
RDW: 13.4 % (ref 11.0–15.0)
Total Lymphocyte: 38.7 %
WBC: 3.4 Thousand/uL — ABNORMAL LOW (ref 3.8–10.8)

## 2024-06-15 LAB — HIV-1 RNA QUANT-NO REFLEX-BLD
HIV 1 RNA Quant: NOT DETECTED {copies}/mL
HIV-1 RNA Quant, Log: NOT DETECTED {Log_copies}/mL

## 2024-06-15 LAB — T-HELPER CELLS (CD4) COUNT (NOT AT ARMC)
Absolute CD4: 400 {cells}/uL — ABNORMAL LOW (ref 490–1740)
CD4 T Helper %: 30 % (ref 30–61)
Total lymphocyte count: 1333 {cells}/uL (ref 850–3900)

## 2024-06-15 LAB — T PALLIDUM AB: T Pallidum Abs: POSITIVE — AB

## 2024-06-15 LAB — RPR TITER: RPR Titer: 1:2 {titer} — ABNORMAL HIGH

## 2024-06-15 LAB — SYPHILIS: RPR W/REFLEX TO RPR TITER AND TREPONEMAL ANTIBODIES, TRADITIONAL SCREENING AND DIAGNOSIS ALGORITHM: RPR Ser Ql: REACTIVE — AB

## 2024-06-16 ENCOUNTER — Ambulatory Visit

## 2024-07-29 ENCOUNTER — Other Ambulatory Visit: Payer: Self-pay

## 2024-07-29 ENCOUNTER — Other Ambulatory Visit (HOSPITAL_COMMUNITY): Payer: Self-pay

## 2024-07-29 NOTE — Progress Notes (Signed)
 Specialty Pharmacy Refill Coordination Note  Micheal Leonard is a 29 y.o. male assessed today regarding refills of clinic administered specialty medication(s) Cabotegravir  & Rilpivirine  (CABENUVA )   Clinic requested Courier to Provider Office   Delivery date: 08/09/24   Verified address: [address] [place] [address]   Medication will be filled on 08/06/24.

## 2024-08-06 ENCOUNTER — Other Ambulatory Visit: Payer: Self-pay

## 2024-08-11 ENCOUNTER — Telehealth: Payer: Self-pay

## 2024-08-11 NOTE — Telephone Encounter (Signed)
 [facility] Patient Advocate Encounter  Patient's medications CABENUVA  have been couriered to [facility] from [facility] and will be administered at the patients appointment on 08/13/24.  Charmaine Sharps, CPhT Specialty Pharmacy Patient [facility] Phone: [phone] Fax:  [phone]

## 2024-08-12 NOTE — Progress Notes (Signed)
 "  HPI: Micheal Leonard is a 29 y.o. male who presents to the [facility] pharmacy clinic for Cabenuva  administration.  Referring ID Provider: Dr. 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   Dizziness 05/30/2020   Erectile dysfunction 05/30/2020   Nonadherence to medical treatment 09/08/2019   Syphilis 07/20/2019   Gonorrhea 07/20/2019   Diarrhea 01/22/2017   Bleeding gums 11/02/2016   Sinus tachycardia 11/02/2016   Cognitive developmental delay 10/25/2016   Acute pulmonary embolism (HCC) 10/24/2016   HIV disease (HCC) 10/24/2016   Chlamydial infection of pharynx 10/23/2016   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   BISMUTH  SUBSALICYLATE (PEPTO BISMOL) 262 MG CHEWABLE TABLET    Chew 1 tablet (262 mg total) by mouth as needed for indigestion (nausea).   CABOTEGRAVIR  & RILPIVIRINE  ER (CABENUVA ) 600 & 900 MG/3ML INJECTION    Inject 1 kit into the muscle every 2 (two) months.   EMPAGLIFLOZIN  (JARDIANCE ) 10 MG TABS TABLET    Take 1 tablet (10 mg total) by mouth daily.   FEXOFENADINE  (ALLEGRA ) 180 MG TABLET    Take 1 tablet (180 mg total) by mouth daily.   FUROSEMIDE  (LASIX ) 20 MG TABLET    Take 1 tablet (20 mg total) by mouth daily.   HYDROCORTISONE 2.5 % CREAM    Apply topically.   SACUBITRIL -VALSARTAN  (ENTRESTO ) 24-26 MG    Take 1 tablet by  mouth 2 (two) times daily.   SPIRONOLACTONE  (ALDACTONE ) 25 MG TABLET    Take 1 tablet (25 mg total) by mouth daily.  Modified Medications   No medications on file  Discontinued Medications   No medications on file    Allergies: Allergies[1]  Labs: Lab Results  Component Value Date   HIV1RNAQUANT NOT DETECTED 06/09/2024   HIV1RNAQUANT <20 DETECTED (A) 12/03/2023   HIV1RNAQUANT Not Detected 08/06/2023   CD4TABS 330 (L) 08/06/2023   CD4TABS 384 (L) 02/12/2023   CD4TABS 267 (L) 05/08/2022    RPR and STI Lab Results  Component Value Date   LABRPR REACTIVE (A) 06/09/2024   LABRPR REACTIVE (A) 12/03/2023   LABRPR REACTIVE (A) 02/12/2023   LABRPR REACTIVE (A) 12/04/2022   LABRPR REACTIVE (A) 04/10/2022   RPRTITER 1:2 (H) 06/09/2024   RPRTITER 1:2 (H) 12/03/2023   RPRTITER 1:2 (H) 02/12/2023   RPRTITER 1:4 (H) 12/04/2022   RPRTITER 1:4 (H) 04/10/2022    STI Results GC CT  02/12/2023  2:08 PM Negative    Negative    Negative  Negative    Negative    Negative   12/04/2022  2:02 PM Negative    Negative    Negative  Negative    Negative    Negative   01/09/2022  9:40 AM Negative    Negative    Negative  Negative    Negative    Negative  04/30/2021 11:11 AM Negative    Negative    Negative  Negative    Positive    Negative   11/24/2020 10:57 AM Negative    Negative    Negative  Negative    Negative    Negative   08/24/2020  8:42 AM Negative    Negative    Negative  Negative    Negative    Negative   06/07/2020 11:16 AM Negative    Negative    Negative  Negative    Negative    Negative   11/02/2019  1:46 PM Negative  Negative   06/16/2019  1:38 PM Negative    Positive    Negative  Negative    Positive    Negative   12/24/2018 12:00 AM Negative    Negative    Negative  Negative    Negative    Negative   07/17/2018 12:00 AM **POSITIVE**    Negative    Negative  **POSITIVE**    Negative    Negative   04/30/2018 12:00 AM Negative  Negative    01/23/2018 12:00 AM Negative    Negative  Negative    Negative   11/27/2017 12:00 AM Negative    Negative    Negative  Negative    Negative    Negative   09/07/2017 12:00 AM **POSITIVE**  Negative   05/05/2017 12:00 AM Negative  **POSITIVE**   01/22/2017 12:00 AM Negative    Negative    Negative  Negative    Negative    Negative   10/25/2016 12:00 AM Negative  **POSITIVE**   10/24/2016 12:00 AM Negative    Negative  Negative    Negative     Hepatitis B Lab Results  Component Value Date   HEPBSAB REACTIVE (A) 12/05/2021   HEPBSAG NON-REACTIVE 01/25/2020   HEPBCAB NON-REACTIVE 01/25/2020   Hepatitis C Lab Results  Component Value Date   HEPCAB NON-REACTIVE 07/02/2019   Hepatitis A Lab Results  Component Value Date   HAV Positive (A) 10/25/2016   Lipids: Lab Results  Component Value Date   CHOL 190 08/06/2023   TRIG 73 08/06/2023   HDL 42 08/06/2023   CHOLHDL 4.5 08/06/2023   VLDL 17 01/22/2017   LDLCALC 131 (H) 08/06/2023    Target Date: 27th  Assessment: Micheal Leonard presents today for his maintenance Cabenuva  injections. Past injections were tolerated well without issues. Last HIV RNA was undetectable in November. Doing well with no issues today.  Lab work:  Lipid panel today; explained he was due but wasn't going to do it since no other labs due; he would like to have test done anyway Accepts RPR testing since drawing blood regardless for lipid panel as above; does not want other STI testing today.   Eligible vaccinations:  Flu, MenACWY booster, PCV20, shingles - politely declines vaccines today  Cabenuva : Administered cabotegravir  600mg /3mL in left upper outer quadrant of the gluteal muscle. Administered rilpivirine  900 mg/3mL in the right upper outer quadrant of the gluteal muscle. No issues with injections. Micheal Leonard will follow up in 2 months for next set of injections.  Plan: - Cabenuva  injections administered - Next injections scheduled for 3/25  with Dr. Fleeta Rothman then 5/26 with Alan - Call with any issues or questions  Micheal Leonard, PharmD PGY1 Pharmacy Resident [facility] 08/12/2024     [1]  Allergies Allergen Reactions   Other     An HIV drug and can not remember  exactly.   "

## 2024-08-13 ENCOUNTER — Other Ambulatory Visit: Payer: Self-pay

## 2024-08-13 ENCOUNTER — Ambulatory Visit: Admitting: Pharmacist

## 2024-08-13 DIAGNOSIS — Z0000 Encounter for general adult medical examination without abnormal findings: Secondary | ICD-10-CM

## 2024-08-13 DIAGNOSIS — B20 Human immunodeficiency virus [HIV] disease: Secondary | ICD-10-CM | POA: Diagnosis present

## 2024-08-13 DIAGNOSIS — Z113 Encounter for screening for infections with a predominantly sexual mode of transmission: Secondary | ICD-10-CM

## 2024-08-13 MED ORDER — CABOTEGRAVIR & RILPIVIRINE ER 600 & 900 MG/3ML IM SUER
1.0000 | Freq: Once | INTRAMUSCULAR | Status: AC
  Administered 2024-08-13: 1 via INTRAMUSCULAR

## 2024-08-13 NOTE — Progress Notes (Signed)
 Micheal Leonard

## 2024-08-17 LAB — LIPID PANEL
Cholesterol: 193 mg/dL
HDL: 45 mg/dL
LDL Cholesterol (Calc): 126 mg/dL — ABNORMAL HIGH
Non-HDL Cholesterol (Calc): 148 mg/dL — ABNORMAL HIGH
Total CHOL/HDL Ratio: 4.3 (calc)
Triglycerides: 116 mg/dL

## 2024-08-17 LAB — RPR TITER: RPR Titer: 1:2 {titer} — ABNORMAL HIGH

## 2024-08-17 LAB — SYPHILIS: RPR W/REFLEX TO RPR TITER AND TREPONEMAL ANTIBODIES, TRADITIONAL SCREENING AND DIAGNOSIS ALGORITHM: RPR Ser Ql: REACTIVE — AB

## 2024-08-17 LAB — T PALLIDUM AB: T Pallidum Abs: POSITIVE — AB

## 2024-10-06 ENCOUNTER — Ambulatory Visit: Payer: Self-pay | Admitting: Infectious Disease

## 2024-12-07 ENCOUNTER — Ambulatory Visit: Payer: Self-pay | Admitting: Pharmacist
